# Patient Record
Sex: Male | Born: 1935
Health system: Southern US, Community
[De-identification: ages and names within clinical notes are randomized; demographics above are authoritative.]

## PROBLEM LIST (undated history)

## (undated) DIAGNOSIS — H5440 Blindness, one eye, unspecified eye: Secondary | ICD-10-CM

## (undated) DIAGNOSIS — I10 Essential (primary) hypertension: Secondary | ICD-10-CM

## (undated) DIAGNOSIS — N320 Bladder-neck obstruction: Secondary | ICD-10-CM

## (undated) DIAGNOSIS — M7989 Other specified soft tissue disorders: Secondary | ICD-10-CM

## (undated) DIAGNOSIS — N200 Calculus of kidney: Secondary | ICD-10-CM

## (undated) DIAGNOSIS — I2699 Other pulmonary embolism without acute cor pulmonale: Secondary | ICD-10-CM

## (undated) DIAGNOSIS — E119 Type 2 diabetes mellitus without complications: Secondary | ICD-10-CM

## (undated) DIAGNOSIS — M109 Gout, unspecified: Secondary | ICD-10-CM

## (undated) DIAGNOSIS — I5021 Acute systolic (congestive) heart failure: Secondary | ICD-10-CM

## (undated) DIAGNOSIS — I2510 Atherosclerotic heart disease of native coronary artery without angina pectoris: Secondary | ICD-10-CM

## (undated) DIAGNOSIS — K649 Unspecified hemorrhoids: Secondary | ICD-10-CM

## (undated) DIAGNOSIS — I2119 ST elevation (STEMI) myocardial infarction involving other coronary artery of inferior wall: Secondary | ICD-10-CM

## (undated) DIAGNOSIS — R7303 Prediabetes: Secondary | ICD-10-CM

## (undated) DIAGNOSIS — I255 Ischemic cardiomyopathy: Secondary | ICD-10-CM

## (undated) HISTORY — PX: CATARACT EXTRACTION: SUR2

## (undated) HISTORY — PX: INTRAOCULAR LENS INSERTION: SHX110

---
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
## 2000-04-25 ENCOUNTER — Ambulatory Visit (HOSPITAL_COMMUNITY): Admission: RE | Admit: 2000-04-25 | Discharge: 2000-04-25 | Payer: Self-pay | Admitting: Family Medicine

## 2000-05-02 ENCOUNTER — Encounter (HOSPITAL_COMMUNITY): Admission: RE | Admit: 2000-05-02 | Discharge: 2000-07-31 | Payer: Self-pay | Admitting: Family Medicine

## 2003-05-30 ENCOUNTER — Encounter (HOSPITAL_COMMUNITY): Admission: RE | Admit: 2003-05-30 | Discharge: 2003-08-28 | Payer: Self-pay | Admitting: Family Medicine

## 2003-11-22 ENCOUNTER — Ambulatory Visit (HOSPITAL_COMMUNITY): Admission: RE | Admit: 2003-11-22 | Discharge: 2003-11-22 | Payer: Self-pay | Admitting: *Deleted

## 2005-08-08 ENCOUNTER — Ambulatory Visit (HOSPITAL_COMMUNITY): Admission: RE | Admit: 2005-08-08 | Discharge: 2005-08-08 | Payer: Self-pay | Admitting: Urology

## 2005-08-14 ENCOUNTER — Ambulatory Visit (HOSPITAL_BASED_OUTPATIENT_CLINIC_OR_DEPARTMENT_OTHER): Admission: RE | Admit: 2005-08-14 | Discharge: 2005-08-14 | Payer: Self-pay | Admitting: Urology

## 2005-08-26 ENCOUNTER — Ambulatory Visit (HOSPITAL_COMMUNITY): Admission: RE | Admit: 2005-08-26 | Discharge: 2005-08-26 | Payer: Self-pay | Admitting: Urology

## 2005-09-14 ENCOUNTER — Ambulatory Visit (HOSPITAL_COMMUNITY): Admission: RE | Admit: 2005-09-14 | Discharge: 2005-09-14 | Payer: Self-pay | Admitting: Family Medicine

## 2014-05-07 ENCOUNTER — Emergency Department (HOSPITAL_COMMUNITY): Payer: Medicare Other | Admitting: Registered Nurse

## 2014-05-07 ENCOUNTER — Encounter (HOSPITAL_COMMUNITY)
Admission: EM | Disposition: A | Discharge status: Home / Self Care | Payer: Self-pay | Source: Home / Self Care | Attending: Emergency Medicine

## 2014-05-07 ENCOUNTER — Observation Stay (HOSPITAL_COMMUNITY)
Admission: EM | Admit: 2014-05-07 | Discharge: 2014-05-08 | Disposition: A | Discharge status: Home / Self Care | Payer: Medicare Other | Attending: Urology | Admitting: Urology

## 2014-05-07 ENCOUNTER — Encounter (HOSPITAL_COMMUNITY): Payer: Self-pay | Admitting: Emergency Medicine

## 2014-05-07 ENCOUNTER — Encounter (HOSPITAL_COMMUNITY): Payer: Self-pay | Admitting: *Deleted

## 2014-05-07 ENCOUNTER — Emergency Department (HOSPITAL_COMMUNITY)
Admission: EM | Admit: 2014-05-07 | Discharge: 2014-05-07 | Disposition: A | Discharge status: Home / Self Care | Payer: Medicare Other | Attending: Emergency Medicine | Admitting: Emergency Medicine

## 2014-05-07 DIAGNOSIS — R0902 Hypoxemia: Secondary | ICD-10-CM | POA: Diagnosis not present

## 2014-05-07 DIAGNOSIS — M109 Gout, unspecified: Secondary | ICD-10-CM | POA: Insufficient documentation

## 2014-05-07 DIAGNOSIS — Z7982 Long term (current) use of aspirin: Secondary | ICD-10-CM | POA: Insufficient documentation

## 2014-05-07 DIAGNOSIS — G8918 Other acute postprocedural pain: Secondary | ICD-10-CM

## 2014-05-07 DIAGNOSIS — N23 Unspecified renal colic: Secondary | ICD-10-CM | POA: Diagnosis not present

## 2014-05-07 DIAGNOSIS — Z87442 Personal history of urinary calculi: Secondary | ICD-10-CM | POA: Insufficient documentation

## 2014-05-07 DIAGNOSIS — E119 Type 2 diabetes mellitus without complications: Secondary | ICD-10-CM | POA: Insufficient documentation

## 2014-05-07 DIAGNOSIS — H5442 Blindness, left eye, normal vision right eye: Secondary | ICD-10-CM | POA: Diagnosis not present

## 2014-05-07 DIAGNOSIS — N200 Calculus of kidney: Secondary | ICD-10-CM

## 2014-05-07 DIAGNOSIS — Z960 Presence of urogenital implants: Secondary | ICD-10-CM | POA: Diagnosis not present

## 2014-05-07 DIAGNOSIS — N201 Calculus of ureter: Secondary | ICD-10-CM | POA: Insufficient documentation

## 2014-05-07 DIAGNOSIS — I10 Essential (primary) hypertension: Secondary | ICD-10-CM | POA: Diagnosis not present

## 2014-05-07 HISTORY — DX: Calculus of kidney: N20.0

## 2014-05-07 HISTORY — DX: Gout, unspecified: M10.9

## 2014-05-07 HISTORY — DX: Prediabetes: R73.03

## 2014-05-07 HISTORY — PX: CYSTOSCOPY W/ URETERAL STENT PLACEMENT: SHX1429

## 2014-05-07 HISTORY — DX: Blindness, one eye, unspecified eye: H54.40

## 2014-05-07 LAB — CBC WITH DIFFERENTIAL/PLATELET
Basophils Absolute: 0 10*3/uL (ref 0.0–0.1)
Basophils Relative: 0 % (ref 0–1)
EOS PCT: 1 % (ref 0–5)
Eosinophils Absolute: 0.1 10*3/uL (ref 0.0–0.7)
HEMATOCRIT: 47.7 % (ref 39.0–52.0)
Hemoglobin: 16.3 g/dL (ref 13.0–17.0)
LYMPHS ABS: 0.8 10*3/uL (ref 0.7–4.0)
LYMPHS PCT: 8 % — AB (ref 12–46)
MCH: 32.1 pg (ref 26.0–34.0)
MCHC: 34.2 g/dL (ref 30.0–36.0)
MCV: 94.1 fL (ref 78.0–100.0)
Monocytes Absolute: 0.8 10*3/uL (ref 0.1–1.0)
Monocytes Relative: 8 % (ref 3–12)
Neutro Abs: 8.6 10*3/uL — ABNORMAL HIGH (ref 1.7–7.7)
Neutrophils Relative %: 83 % — ABNORMAL HIGH (ref 43–77)
PLATELETS: 176 10*3/uL (ref 150–400)
RBC: 5.07 MIL/uL (ref 4.22–5.81)
RDW: 13.4 % (ref 11.5–15.5)
WBC: 10.3 10*3/uL (ref 4.0–10.5)

## 2014-05-07 LAB — COMPREHENSIVE METABOLIC PANEL
ALBUMIN: 3.4 g/dL — AB (ref 3.5–5.2)
ALT: 26 U/L (ref 0–53)
ANION GAP: 13 (ref 5–15)
AST: 25 U/L (ref 0–37)
Alkaline Phosphatase: 81 U/L (ref 39–117)
BILIRUBIN TOTAL: 0.4 mg/dL (ref 0.3–1.2)
BUN: 21 mg/dL (ref 6–23)
CHLORIDE: 101 meq/L (ref 96–112)
CO2: 24 mEq/L (ref 19–32)
CREATININE: 1.48 mg/dL — AB (ref 0.50–1.35)
Calcium: 9.1 mg/dL (ref 8.4–10.5)
GFR calc Af Amer: 51 mL/min — ABNORMAL LOW (ref >90)
GFR calc non Af Amer: 44 mL/min — ABNORMAL LOW (ref >90)
Glucose, Bld: 180 mg/dL — ABNORMAL HIGH (ref 70–99)
Potassium: 4.4 mEq/L (ref 3.7–5.3)
Sodium: 138 mEq/L (ref 137–147)
Total Protein: 6.5 g/dL (ref 6.0–8.3)

## 2014-05-07 LAB — URINALYSIS, ROUTINE W REFLEX MICROSCOPIC
Bilirubin Urine: NEGATIVE
Glucose, UA: NEGATIVE mg/dL
Ketones, ur: NEGATIVE mg/dL
Nitrite: NEGATIVE
PH: 5.5 (ref 5.0–8.0)
Protein, ur: NEGATIVE mg/dL
SPECIFIC GRAVITY, URINE: 1.025 (ref 1.005–1.030)
UROBILINOGEN UA: 0.2 mg/dL (ref 0.0–1.0)

## 2014-05-07 LAB — URINE MICROSCOPIC-ADD ON

## 2014-05-07 LAB — LIPASE, BLOOD: LIPASE: 20 U/L (ref 11–59)

## 2014-05-07 SURGERY — CYSTOSCOPY, WITH RETROGRADE PYELOGRAM AND URETERAL STENT INSERTION
Anesthesia: General | Site: Ureter | Laterality: Left

## 2014-05-07 MED ORDER — FUROSEMIDE 20 MG PO TABS
20.0000 mg | ORAL_TABLET | Freq: Every day | ORAL | Status: DC
  Administered 2014-05-08: 20 mg via ORAL
  Filled 2014-05-07: qty 1

## 2014-05-07 MED ORDER — SODIUM CHLORIDE 0.9 % IV SOLN
1000.0000 mL | INTRAVENOUS | Status: DC
  Administered 2014-05-07: 1000 mL via INTRAVENOUS

## 2014-05-07 MED ORDER — LIDOCAINE HCL 2 % EX GEL
CUTANEOUS | Status: DC | PRN
  Administered 2014-05-07: 1 via URETHRAL

## 2014-05-07 MED ORDER — HYDROMORPHONE HCL 1 MG/ML IJ SOLN
1.0000 mg | Freq: Once | INTRAMUSCULAR | Status: AC
  Administered 2014-05-07: 1 mg via INTRAVENOUS
  Filled 2014-05-07: qty 1

## 2014-05-07 MED ORDER — ONDANSETRON HCL 4 MG/2ML IJ SOLN
INTRAMUSCULAR | Status: DC | PRN
  Administered 2014-05-07: 4 mg via INTRAVENOUS

## 2014-05-07 MED ORDER — BELLADONNA ALKALOIDS-OPIUM 16.2-60 MG RE SUPP
1.0000 | Freq: Three times a day (TID) | RECTAL | Status: DC
  Administered 2014-05-08 (×2): 1 via RECTAL
  Filled 2014-05-07 (×2): qty 1

## 2014-05-07 MED ORDER — LACTATED RINGERS IV SOLN
INTRAVENOUS | Status: DC
  Administered 2014-05-07: 14:00:00 via INTRAVENOUS

## 2014-05-07 MED ORDER — ONDANSETRON HCL 4 MG/2ML IJ SOLN
INTRAMUSCULAR | Status: AC
  Filled 2014-05-07: qty 2

## 2014-05-07 MED ORDER — ONDANSETRON HCL 4 MG/2ML IJ SOLN
4.0000 mg | Freq: Once | INTRAMUSCULAR | Status: AC
  Administered 2014-05-07: 4 mg via INTRAVENOUS
  Filled 2014-05-07: qty 2

## 2014-05-07 MED ORDER — CIPROFLOXACIN IN D5W 400 MG/200ML IV SOLN
INTRAVENOUS | Status: AC
  Filled 2014-05-07: qty 200

## 2014-05-07 MED ORDER — IOHEXOL 300 MG/ML  SOLN
INTRAMUSCULAR | Status: DC | PRN
  Administered 2014-05-07: 10 mL

## 2014-05-07 MED ORDER — PROPOFOL 10 MG/ML IV BOLUS
INTRAVENOUS | Status: DC | PRN
  Administered 2014-05-07: 120 mg via INTRAVENOUS

## 2014-05-07 MED ORDER — OXYCODONE-ACETAMINOPHEN 5-325 MG PO TABS: 1.0000 | ORAL_TABLET | ORAL | Status: DC | PRN

## 2014-05-07 MED ORDER — MIRABEGRON ER 25 MG PO TB24
25.0000 mg | ORAL_TABLET | Freq: Every day | ORAL | Status: DC
  Administered 2014-05-08 (×2): 25 mg via ORAL
  Filled 2014-05-07 (×3): qty 1

## 2014-05-07 MED ORDER — ONDANSETRON HCL 4 MG/2ML IJ SOLN: 4.0000 mg | INTRAMUSCULAR | Status: DC | PRN

## 2014-05-07 MED ORDER — FENTANYL CITRATE 0.05 MG/ML IJ SOLN
INTRAMUSCULAR | Status: DC | PRN
  Administered 2014-05-07: 25 ug via INTRAVENOUS

## 2014-05-07 MED ORDER — ALLOPURINOL 300 MG PO TABS
300.0000 mg | ORAL_TABLET | Freq: Every day | ORAL | Status: DC
  Administered 2014-05-08: 300 mg via ORAL
  Filled 2014-05-07: qty 1

## 2014-05-07 MED ORDER — TAMSULOSIN HCL 0.4 MG PO CAPS
0.8000 mg | ORAL_CAPSULE | Freq: Every day | ORAL | Status: DC
  Administered 2014-05-07: 0.8 mg via ORAL
  Filled 2014-05-07 (×2): qty 2

## 2014-05-07 MED ORDER — KETOROLAC TROMETHAMINE 15 MG/ML IJ SOLN
15.0000 mg | Freq: Once | INTRAMUSCULAR | Status: AC
  Administered 2014-05-07: 15 mg via INTRAVENOUS
  Filled 2014-05-07: qty 1

## 2014-05-07 MED ORDER — SODIUM CHLORIDE 0.9 % IR SOLN
Status: DC | PRN
  Administered 2014-05-07: 3000 mL

## 2014-05-07 MED ORDER — HYDROMORPHONE HCL 1 MG/ML IJ SOLN: 0.5000 mg | INTRAMUSCULAR | Status: DC | PRN

## 2014-05-07 MED ORDER — PHENAZOPYRIDINE HCL 100 MG PO TABS
100.0000 mg | ORAL_TABLET | Freq: Three times a day (TID) | ORAL | Status: DC
  Administered 2014-05-07 – 2014-05-08 (×2): 100 mg via ORAL
  Filled 2014-05-07 (×5): qty 1

## 2014-05-07 MED ORDER — ASPIRIN 325 MG PO TABS
325.0000 mg | ORAL_TABLET | Freq: Every day | ORAL | Status: DC
  Administered 2014-05-08: 325 mg via ORAL
  Filled 2014-05-07: qty 1

## 2014-05-07 MED ORDER — LACTATED RINGERS IV SOLN
INTRAVENOUS | Status: DC | PRN
  Administered 2014-05-07: 13:00:00 via INTRAVENOUS

## 2014-05-07 MED ORDER — HYDROMORPHONE HCL 1 MG/ML IJ SOLN
0.5000 mg | INTRAMUSCULAR | Status: DC | PRN
  Administered 2014-05-07: 0.5 mg via INTRAVENOUS
  Filled 2014-05-07: qty 1

## 2014-05-07 MED ORDER — CIPROFLOXACIN IN D5W 400 MG/200ML IV SOLN
INTRAVENOUS | Status: DC | PRN
  Administered 2014-05-07: 400 mg via INTRAVENOUS

## 2014-05-07 MED ORDER — DOCUSATE SODIUM 100 MG PO CAPS
100.0000 mg | ORAL_CAPSULE | Freq: Two times a day (BID) | ORAL | Status: DC
  Administered 2014-05-08 (×2): 100 mg via ORAL
  Filled 2014-05-07 (×3): qty 1

## 2014-05-07 MED ORDER — BELLADONNA ALKALOIDS-OPIUM 16.2-60 MG RE SUPP: 1.0000 | Freq: Once | RECTAL | Status: DC

## 2014-05-07 MED ORDER — LIDOCAINE HCL 2 % EX GEL
CUTANEOUS | Status: AC
  Filled 2014-05-07: qty 10

## 2014-05-07 MED ORDER — FENTANYL CITRATE 0.05 MG/ML IJ SOLN: 25.0000 ug | INTRAMUSCULAR | Status: DC | PRN

## 2014-05-07 MED ORDER — FENTANYL CITRATE 0.05 MG/ML IJ SOLN
INTRAMUSCULAR | Status: AC
  Filled 2014-05-07: qty 2

## 2014-05-07 MED ORDER — PROPOFOL 10 MG/ML IV BOLUS
INTRAVENOUS | Status: AC
  Filled 2014-05-07: qty 20

## 2014-05-07 MED ORDER — LIDOCAINE HCL (CARDIAC) 20 MG/ML IV SOLN
INTRAVENOUS | Status: DC | PRN
  Administered 2014-05-07: 50 mg via INTRAVENOUS

## 2014-05-07 MED ORDER — LISINOPRIL 10 MG PO TABS
10.0000 mg | ORAL_TABLET | Freq: Every day | ORAL | Status: DC
  Administered 2014-05-08: 10 mg via ORAL
  Filled 2014-05-07: qty 1

## 2014-05-07 MED ORDER — SODIUM CHLORIDE 0.9 % IV SOLN
1000.0000 mL | Freq: Once | INTRAVENOUS | Status: AC
  Administered 2014-05-07: 1000 mL via INTRAVENOUS

## 2014-05-07 MED ORDER — LIDOCAINE HCL (CARDIAC) 20 MG/ML IV SOLN
INTRAVENOUS | Status: AC
  Filled 2014-05-07: qty 5

## 2014-05-07 MED ORDER — SENNA 8.6 MG PO TABS
1.0000 | ORAL_TABLET | Freq: Two times a day (BID) | ORAL | Status: DC
  Administered 2014-05-07 – 2014-05-08 (×2): 8.6 mg via ORAL
  Filled 2014-05-07 (×2): qty 1

## 2014-05-07 MED ORDER — ONDANSETRON 4 MG PO TBDP
4.0000 mg | ORAL_TABLET | Freq: Once | ORAL | Status: AC
  Administered 2014-05-07: 4 mg via ORAL
  Filled 2014-05-07: qty 1

## 2014-05-07 SURGICAL SUPPLY — 15 items
BAG URO CATCHER STRL LF (DRAPE) ×2 IMPLANT
CATH URET 5FR 28IN OPEN ENDED (CATHETERS) ×2 IMPLANT
GLOVE BIOGEL M STRL SZ7.5 (GLOVE) ×2 IMPLANT
GLOVE BIOGEL PI IND STRL 6.5 (GLOVE) IMPLANT
GLOVE BIOGEL PI INDICATOR 6.5 (GLOVE) ×4
GOWN STRL REUS W/ TWL LRG LVL3 (GOWN DISPOSABLE) IMPLANT
GOWN STRL REUS W/ TWL XL LVL3 (GOWN DISPOSABLE) IMPLANT
GOWN STRL REUS W/TWL LRG LVL3 (GOWN DISPOSABLE) ×3
GOWN STRL REUS W/TWL XL LVL3 (GOWN DISPOSABLE) ×3
GUIDEWIRE ANG ZIPWIRE 038X150 (WIRE) ×2 IMPLANT
GUIDEWIRE STR DUAL SENSOR (WIRE) ×2 IMPLANT
PACK CYSTO (CUSTOM PROCEDURE TRAY) ×2 IMPLANT
STENT CONTOUR 6FRX26X.038 (STENTS) ×2 IMPLANT
TUBING CONNECTING 10 (TUBING) ×1 IMPLANT
TUBING CONNECTING 10' (TUBING) ×1

## 2014-05-07 NOTE — Op Note (Signed)
Preoperative diagnosis:  1. Left proximal obstructing stone   Postoperative diagnosis:  1. same   Procedure:  1. Cystoscopy 2. left ureteral stent placement 3. left retrograde pyelography with interpretation   Surgeon: Crist FatBenjamin W. Jakobee Brackins, MD  Anesthesia: General  Complications: None  Intraoperative findings: normal caliber ureter to the level of the proximal ureter where there was a large filling defect consistent with the patient's known stone.  26cm x 20F stent placed in left ureter.  EBL: Minimal  Specimens: None  Indication: Gordon Novak is a 79 y.o. patient with large obstructing stone in left proximal ureter. After reviewing the management options for treatment, he elected to proceed with the above surgical procedure(s). We have discussed the potential benefits and risks of the procedure, side effects of the proposed treatment, the likelihood of the patient achieving the goals of the procedure, and any potential problems that might occur during the procedure or recuperation. Informed consent has been obtained.  Description of procedure:  The patient was taken to the operating room and general anesthesia was induced.  The patient was placed in the dorsal lithotomy position, prepped and draped in the usual sterile fashion, and preoperative antibiotics were administered. A preoperative time-out was performed.   Cystourethroscopy was performed.  The patient's urethra was examined and was normal with bilobar prostatic hypertrophy with a median lobe. The bladder was then systematically examined in its entirety. There was no evidence for any bladder tumors, stones, or other mucosal pathology.    Attention then turned to the left ureteral orifice and a ureteral catheter was used to intubate the ureteral orifice.  Omnipaque contrast was injected through the ureteral catheter and a retrograde pyelogram was performed with findings as dictated above.  A 0.38 sensor guidewire was then  advanced up the left ureter into the renal pelvis under fluoroscopic guidance.  The wire was then backloaded through the cystoscope and a ureteral stent was advance over the wire using Seldinger technique.  The stent was positioned appropriately under fluoroscopic and cystoscopic guidance.  The wire was then removed with an adequate stent curl noted in the renal pelvis as well as in the bladder.  The bladder was then emptied and the procedure ended.  The patient appeared to tolerate the procedure well and without complications.  The patient was able to be awakened and transferred to the recovery unit in satisfactory condition.    Crist FatBenjamin W. Pegge Cumberledge, M.D.

## 2014-05-07 NOTE — ED Provider Notes (Addendum)
CSN: [id number]     Arrival date & time 05/07/14  [id number] History   First MD Initiated Contact with Patient 05/07/14 1001     Chief Complaint  Patient presents with  . Nephrolithiasis    Patient is a 79 y.o. male presenting with flank pain. The history is provided by the patient.  Flank Pain Episode onset: Started on hursday. The problem occurs constantly. The problem has been gradually improving. Associated symptoms include abdominal pain (llq). Pertinent negatives include no shortness of breath. Associated symptoms comments: Left flank pain . Exacerbated by: does not hurt to urinate, drinking did make it worse earleirr. Treatments tried: Saw the urologist on Thursday, given oxycodone and zofran.  Pt saw his urologist on Thursday.  He had a CT scan in the office and diagnosed with a kidney stone. He was told he may end up needing lithotripsy.  Pt called the urologist today.  He was told to come to the ED.  He may need a surgery.  Past Medical History  Diagnosis Date  . Nephrolithiasis   . Pre-diabetes   . Gout   . Blindness of left eye    Past Surgical History  Procedure Laterality Date  . Cataract extraction    . Intraocular lens insertion Right    No family history on file. History  Substance Use Topics  . Smoking status: Never Smoker   . Smokeless tobacco: Not on file  . Alcohol Use: No    Review of Systems  Respiratory: Negative for shortness of breath.   Gastrointestinal: Positive for abdominal pain (llq).  Genitourinary: Positive for flank pain.  All other systems reviewed and are negative.     Allergies  Review of patient's allergies indicates no known allergies.  Home Medications   Prior to Admission medications   Not on File   BP 160/74 mmHg  Pulse 96  Temp(Src) 98.3 F (36.8 C) (Oral)  Resp 14  SpO2 95% Physical Exam  Constitutional: He appears well-developed and well-nourished. No distress.  HENT:  Head: Normocephalic and atraumatic.  Right Ear:  External ear normal.  Left Ear: External ear normal.  Eyes: Conjunctivae are normal. Right eye exhibits no discharge. Left eye exhibits no discharge. No scleral icterus.  Temporal deviation left eye  Neck: Neck supple. No tracheal deviation present.  Cardiovascular: Normal rate, regular rhythm and intact distal pulses.   Pulmonary/Chest: Effort normal and breath sounds normal. No stridor. No respiratory distress. He has no wheezes. He has no rales.  Abdominal: Soft. Bowel sounds are normal. He exhibits no distension. There is no tenderness. There is CVA tenderness (mild left). There is no rebound and no guarding.  Musculoskeletal: He exhibits edema. He exhibits no tenderness.  Edema of left lle (chronic per patient, has had negative outpatient workup in the past)  Neurological: He is alert. He has normal strength. No cranial nerve deficit (no facial droop, extraocular movements intact, no slurred speech) or sensory deficit. He exhibits normal muscle tone. He displays no seizure activity. Coordination normal.  Skin: Skin is warm and dry. No rash noted.  Psychiatric: He has a normal mood and affect.  Nursing note and vitals reviewed.   ED Course  Procedures (including critical care time) Labs Review Labs Reviewed  URINALYSIS, ROUTINE W REFLEX MICROSCOPIC - Abnormal; Notable for the following:    Hgb urine dipstick SMALL (*)    Leukocytes, UA SMALL (*)    All other components within normal limits  CBC WITH DIFFERENTIAL - Abnormal; Notable  for the following:    Neutrophils Relative % 83 (*)    Neutro Abs 8.6 (*)    Lymphocytes Relative 8 (*)    All other components within normal limits  COMPREHENSIVE METABOLIC PANEL - Abnormal; Notable for the following:    Glucose, Bld 180 (*)    Creatinine, Ser 1.48 (*)    Albumin 3.4 (*)    GFR calc non Af Amer 44 (*)    GFR calc Af Amer 51 (*)    All other components within normal limits  LIPASE, BLOOD  URINE MICROSCOPIC-ADD ON    Medications   0.9 %  sodium chloride infusion (0 mLs Intravenous Stopped 05/07/14 1137)    Followed by  0.9 %  sodium chloride infusion (1,000 mLs Intravenous New Bag/Given 05/07/14 1137)  HYDROmorphone (DILAUDID) injection 0.5 mg (0.5 mg Intravenous Given 05/07/14 1034)  ondansetron (ZOFRAN-ODT) disintegrating tablet 4 mg (4 mg Oral Given 05/07/14 0950)  ondansetron (ZOFRAN) injection 4 mg (4 mg Intravenous Given 05/07/14 1034)    MDM   Final diagnoses:  Ureteral colic   Pt had an outpatient CT scan showing a large distal ureteral stone.  Symptoms have improved in the ED.  Dr Craige CottaKirby evaluated the patient in the ED.  Plan on taking him to the OR for a ureteral stent.    Linwood DibblesJon Ashanta Amoroso, MD 05/07/14 1143  Linwood DibblesJon Manases Etchison, MD 05/07/14 [phone]

## 2014-05-07 NOTE — ED Notes (Signed)
Pt presents with worsening L flank pain and hematuria. Pt states he was seen today and had renal stent placed by Dr. Craige CottaKirby, stone too big to pass, pt returns Wednesday for decision on how to proceed to remove stones

## 2014-05-07 NOTE — H&P (Signed)
H&P  Chief Complaint: Renal Colic Pain  History of Present Illness: Gordon Novak is a 79 y.o. male with a history of nephrolithiasis who is taken for left-sided ureteral stent placement today. Please see earlier consult note for full details. Briefly, he underwent left ureteral stent placement for management of renal colic pain without any associated concern for infection. He was discharged following stent placement but returned to the emergency room this evening with persistent uncontrollable pain. He has not been having any fevers or chills at home and is otherwise doing okay. He has been voiding with some mild hematuria.  In the emergency department he was found to be afebrile with mild tachycardia to 91 and hypertension to 143/65. Also with low O2 sats 94% on 2 L nasal cannula. He reports that it hurts when he urinates, with pain in his left flank as well as dysuria. At the time I saw him, he stated that his pain was a little bit better however it has been coming and going. When the pain is at its worst, it is a 10 out of 10.  He also reports a little bit of additional difficulty urinating.  Past Medical History  Diagnosis Date  . Nephrolithiasis   . Pre-diabetes   . Gout   . Blindness of left eye     Past Surgical History  Procedure Laterality Date  . Cataract extraction    . Intraocular lens insertion Right     Home Medications:    Medication List    ASK your doctor about these medications        allopurinol 300 MG tablet  Commonly known as:  ZYLOPRIM  Take 300 mg by mouth daily.     aspirin 325 MG tablet  Take 325 mg by mouth daily.     beta carotene w/minerals tablet  Take 1 tablet by mouth daily.     furosemide 20 MG tablet  Commonly known as:  LASIX  Take 20 mg by mouth daily.     lisinopril 10 MG tablet  Commonly known as:  PRINIVIL,ZESTRIL  Take 10 mg by mouth daily.     multivitamin with minerals Tabs tablet  Take 1 tablet by mouth daily.     oxyCODONE-acetaminophen 5-325 MG per tablet  Commonly known as:  PERCOCET/ROXICET  Take 2 tablets by mouth every 6 (six) hours as needed for severe pain.     promethazine 12.5 MG tablet  Commonly known as:  PHENERGAN  Take 12.5 mg by mouth every 6 (six) hours as needed for nausea.     RAPAFLO 8 MG Caps capsule  Generic drug:  silodosin  Take 8 mg by mouth as needed (hesitant urination). with food     valACYclovir 500 MG tablet  Commonly known as:  VALTREX  Take 500 mg by mouth 2 (two) times daily as needed (cold sores).        Allergies: No Known Allergies  No family history on file.  Social History:  reports that he has never smoked. He does not have any smokeless tobacco history on file. He reports that he does not drink alcohol or use illicit drugs.  ROS: A complete review of systems was performed.  All systems are negative except for pertinent findings as noted.  Physical Exam:  Vital signs in last 24 hours: Temp:  [97.8 F (36.6 C)-98.3 F (36.8 C)] 98 F (36.7 C) (12/12 2038) Pulse Rate:  [64-96] 91 (12/12 2038) Resp:  [10-18] 18 (12/12 2038) BP: (  110-160)/(61-74) 143/65 mmHg (12/12 2038) SpO2:  [94 %-100 %] 94 % (12/12 2038) Weight:  [87.998 kg (194 lb)] 87.998 kg (194 lb) (12/12 2049) Constitutional:  Alert and oriented, No acute distress Cardiovascular: Regular rate and rhythm, No JVD Respiratory: Normal respiratory effort, Lungs clear bilaterally GI: Abdomen is soft, nontender, nondistended, no abdominal masses GU: Mild left CVA tenderness Lymphatic: No lymphadenopathy Neurologic: Grossly intact, no focal deficits Psychiatric: Normal mood and affect  Laboratory Data:   Recent Labs  05/07/14 1035  WBC 10.3  HGB 16.3  HCT 47.7  PLT 176     Recent Labs  05/07/14 1035  NA 138  K 4.4  CL 101  GLUCOSE 180*  BUN 21  CALCIUM 9.1  CREATININE 1.48*     Results for orders placed or performed during the hospital encounter of 05/07/14 (from the  past 24 hour(s))  Urinalysis, Routine w reflex microscopic     Status: Abnormal   Collection Time: 05/07/14 10:05 AM  Result Value Ref Range   Color, Urine YELLOW YELLOW   APPearance CLEAR CLEAR   Specific Gravity, Urine 1.025 1.005 - 1.030   pH 5.5 5.0 - 8.0   Glucose, UA NEGATIVE NEGATIVE mg/dL   Hgb urine dipstick SMALL (A) NEGATIVE   Bilirubin Urine NEGATIVE NEGATIVE   Ketones, ur NEGATIVE NEGATIVE mg/dL   Protein, ur NEGATIVE NEGATIVE mg/dL   Urobilinogen, UA 0.2 0.0 - 1.0 mg/dL   Nitrite NEGATIVE NEGATIVE   Leukocytes, UA SMALL (A) NEGATIVE  Urine microscopic-add on     Status: None   Collection Time: 05/07/14 10:05 AM  Result Value Ref Range   Squamous Epithelial / LPF RARE RARE   WBC, UA 3-6 <3 WBC/hpf   RBC / HPF 3-6 <3 RBC/hpf  CBC WITH DIFFERENTIAL     Status: Abnormal   Collection Time: 05/07/14 10:35 AM  Result Value Ref Range   WBC 10.3 4.0 - 10.5 K/uL   RBC 5.07 4.22 - 5.81 MIL/uL   Hemoglobin 16.3 13.0 - 17.0 g/dL   HCT 40.9 81.1 - 91.4 %   MCV 94.1 78.0 - 100.0 fL   MCH 32.1 26.0 - 34.0 pg   MCHC 34.2 30.0 - 36.0 g/dL   RDW 78.2 95.6 - 21.3 %   Platelets 176 150 - 400 K/uL   Neutrophils Relative % 83 (H) 43 - 77 %   Neutro Abs 8.6 (H) 1.7 - 7.7 K/uL   Lymphocytes Relative 8 (L) 12 - 46 %   Lymphs Abs 0.8 0.7 - 4.0 K/uL   Monocytes Relative 8 3 - 12 %   Monocytes Absolute 0.8 0.1 - 1.0 K/uL   Eosinophils Relative 1 0 - 5 %   Eosinophils Absolute 0.1 0.0 - 0.7 K/uL   Basophils Relative 0 0 - 1 %   Basophils Absolute 0.0 0.0 - 0.1 K/uL  Comprehensive metabolic panel     Status: Abnormal   Collection Time: 05/07/14 10:35 AM  Result Value Ref Range   Sodium 138 137 - 147 mEq/L   Potassium 4.4 3.7 - 5.3 mEq/L   Chloride 101 96 - 112 mEq/L   CO2 24 19 - 32 mEq/L   Glucose, Bld 180 (H) 70 - 99 mg/dL   BUN 21 6 - 23 mg/dL   Creatinine, Ser 0.86 (H) 0.50 - 1.35 mg/dL   Calcium 9.1 8.4 - 57.8 mg/dL   Total Protein 6.5 6.0 - 8.3 g/dL   Albumin 3.4 (L) 3.5  - 5.2 g/dL  AST 25 0 - 37 U/L   ALT 26 0 - 53 U/L   Alkaline Phosphatase 81 39 - 117 U/L   Total Bilirubin 0.4 0.3 - 1.2 mg/dL   GFR calc non Af Amer 44 (L) >90 mL/min   GFR calc Af Amer 51 (L) >90 mL/min   Anion gap 13 5 - 15  Lipase, blood     Status: None   Collection Time: 05/07/14 10:35 AM  Result Value Ref Range   Lipase 20 11 - 59 U/L   No results found for this or any previous visit (from the past 240 hour(s)).  Renal Function:  Recent Labs  05/07/14 1035  CREATININE 1.48*   Estimated Creatinine Clearance: 45.9 mL/min (by C-G formula based on Cr of 1.48).  Radiologic Imaging: No results found.  Impression/Assessment:  79 year old who returns to the emergency department with persistent left-sided renal colic pain status post left-sided ureteral stent placement earlier today for a left-sided UPJ stone. He has no evidence of infection clinically however his pain is uncontrollable requiring admission to observation for pain management.  Plan:  Admit to observation We'll provide narcotic pain medicine as well as B and O suppository and myrbetriq to hopefully help with stent-related discomfort.  We'll also switch alpha-blocker to Flomax. Regular diet. We'll check postvoid residual to ensure that he is emptying his bladder reasonably well. Labs in the morning Incentive spirometer

## 2014-05-07 NOTE — Transfer of Care (Signed)
Immediate Anesthesia Transfer of Care Note  Patient: Gordon Novak  Procedure(s) Performed: Procedure(s): CYSTOSCOPY WITH RETROGRADE PYELOGRAM/ LEFT URETERAL STENT PLACEMENT (Left)  Patient Location: PACU  Anesthesia Type:General  Level of Consciousness: awake, alert , oriented and patient cooperative  Airway & Oxygen Therapy: Patient Spontanous Breathing and Patient connected to face mask oxygen  Post-op Assessment: Report given to PACU RN, Post -op Vital signs reviewed and stable and Patient moving all extremities  Post vital signs: Reviewed and stable  Complications: No apparent anesthesia complications

## 2014-05-07 NOTE — Anesthesia Procedure Notes (Signed)
Procedure Name: LMA Insertion Date/Time: 05/07/2014 1:03 PM Performed by: Jarvis NewcomerARMISTEAD, Mardie Kellen A Pre-anesthesia Checklist: Patient identified, Emergency Drugs available, Suction available, Patient being monitored and Timeout performed Patient Re-evaluated:Patient Re-evaluated prior to inductionOxygen Delivery Method: Circle system utilized Preoxygenation: Pre-oxygenation with 100% oxygen Intubation Type: IV induction Ventilation: Mask ventilation without difficulty LMA: LMA inserted LMA Size: 4.0 Number of attempts: 1 Placement Confirmation: positive ETCO2 and breath sounds checked- equal and bilateral Tube secured with: Tape Dental Injury: Teeth and Oropharynx as per pre-operative assessment

## 2014-05-07 NOTE — ED Notes (Signed)
MD at bedside. 

## 2014-05-07 NOTE — ED Notes (Signed)
Pt reports Hx kidney stones. Seen at [facility] Tuesday, Dx with stones. C/o L flank pain, worse since 1am today. Denies hematuria, +nausea, chills.

## 2014-05-07 NOTE — Anesthesia Preprocedure Evaluation (Addendum)
Anesthesia Evaluation  Patient identified by MRN, date of birth, ID band Patient awake    Reviewed: Allergy & Precautions, H&P , NPO status , Patient's Chart, lab work & pertinent test results  Airway Mallampati: II  TM Distance: >3 FB Neck ROM: full    Dental no notable dental hx. (+) Teeth Intact, Dental Advisory Given   Pulmonary neg pulmonary ROS,  breath sounds clear to auscultation  Pulmonary exam normal       Cardiovascular Exercise Tolerance: Good negative cardio ROS  Rhythm:regular Rate:Normal     Neuro/Psych Blind left eye negative neurological ROS  negative psych ROS   GI/Hepatic negative GI ROS, Neg liver ROS,   Endo/Other  prediabetes  Renal/GU negative Renal ROS  negative genitourinary   Musculoskeletal   Abdominal   Peds  Hematology negative hematology ROS (+)   Anesthesia Other Findings   Reproductive/Obstetrics negative OB ROS                            Anesthesia Physical Anesthesia Plan  ASA: II  Anesthesia Plan: General   Post-op Pain Management:    Induction: Intravenous  Airway Management Planned: LMA  Additional Equipment:   Intra-op Plan:   Post-operative Plan:   Informed Consent: I have reviewed the patients History and Physical, chart, labs and discussed the procedure including the risks, benefits and alternatives for the proposed anesthesia with the patient or authorized representative who has indicated his/her understanding and acceptance.   Dental Advisory Given  Plan Discussed with: CRNA and Surgeon  Anesthesia Plan Comments:         Anesthesia Quick Evaluation

## 2014-05-07 NOTE — Discharge Instructions (Signed)
DISCHARGE INSTRUCTIONS FOR KIDNEY STONE/URETERAL STENT   MEDICATIONS:  1.  Resume all your other meds from home.  ACTIVITY:  1. No strenuous activity 2. No driving while on narcotic pain medications  3. Drink plenty of water  4. Continue to walk at home - you can still get blood clots when you are at home, so keep active, but don't over do it.  5. May return to work/school tomorrow or when you feel ready   BATHING:  1. You can shower and we recommend daily showers    SIGNS/SYMPTOMS TO CALL:  Please call us if you have a fever greater than 101.5, uncontrolled nausea/vomiting, uncontrolled pain, dizziness, unable to urinate, bloody urine, chest pain, shortness of breath, leg swelling, leg pain, redness around wound, drainage from wound, or any other concerns or questions.   You can reach us at [phone].   FOLLOW-UP:  1. As scheduled with Dr. Mena GoesEskridge, MD

## 2014-05-07 NOTE — ED Provider Notes (Signed)
CSN: [id number]     Arrival date & time 05/07/14  1937 History   First MD Initiated Contact with Patient 05/07/14 2050     Chief Complaint  Patient presents with  . Flank Pain     Patient is a 79 y.o. male presenting with flank pain. The history is provided by the patient.  Flank Pain   Mr. Gordon Novak presents for evaluation of left flank pain.  He felt pain in his left flank and left lower quadrant 2 days ago. He was seen in the emergency department this morning and admitted to the operating room for stent placement by urology for a stone. He comes in today because his pain is uncontrolled despite pain medications at home. He is tried oxycodone without any improvement in his symptoms. He states that the pain with urination is more intense than prior to his initial arrival. He denies fevers, chest pain, shortness of breath, vomiting. Symptoms are severe, constant, worsening.  Past Medical History  Diagnosis Date  . Nephrolithiasis   . Pre-diabetes   . Gout   . Blindness of left eye    Past Surgical History  Procedure Laterality Date  . Cataract extraction    . Intraocular lens insertion Right    No family history on file. History  Substance Use Topics  . Smoking status: Never Smoker   . Smokeless tobacco: Not on file  . Alcohol Use: No    Review of Systems  Genitourinary: Positive for flank pain.  All other systems reviewed and are negative.     Allergies  Review of patient's allergies indicates no known allergies.  Home Medications   Prior to Admission medications   Medication Sig Start Date End Date Taking? Authorizing Provider  allopurinol (ZYLOPRIM) 300 MG tablet Take 300 mg by mouth daily.  02/15/14   Historical Provider, MD  aspirin 325 MG tablet Take 325 mg by mouth daily.    Historical Provider, MD  beta carotene w/minerals (OCUVITE) tablet Take 1 tablet by mouth daily.    Historical Provider, MD  furosemide (LASIX) 20 MG tablet Take 20 mg by mouth daily.   02/15/14   Historical Provider, MD  lisinopril (PRINIVIL,ZESTRIL) 10 MG tablet Take 10 mg by mouth daily.  02/15/14   Historical Provider, MD  Multiple Vitamin (MULTIVITAMIN WITH MINERALS) TABS tablet Take 1 tablet by mouth daily.    Historical Provider, MD  oxyCODONE-acetaminophen (PERCOCET/ROXICET) 5-325 MG per tablet Take 2 tablets by mouth every 6 (six) hours as needed for severe pain.  05/05/14   Historical Provider, MD  promethazine (PHENERGAN) 12.5 MG tablet Take 12.5 mg by mouth every 6 (six) hours as needed for nausea.  05/05/14   Historical Provider, MD  RAPAFLO 8 MG CAPS capsule Take 8 mg by mouth as needed (hesitant urination). with food 03/30/14   Historical Provider, MD  valACYclovir (VALTREX) 500 MG tablet Take 500 mg by mouth 2 (two) times daily as needed (cold sores).    Historical Provider, MD   BP 143/65 mmHg  Pulse 91  Temp(Src) 98 F (36.7 C) (Oral)  Resp 18  Ht 6' (1.829 m)  Wt 194 lb (87.998 kg)  BMI 26.31 kg/m2  SpO2 94% Physical Exam  Constitutional: He is oriented to person, place, and time. He appears well-developed and well-nourished.  HENT:  Head: Normocephalic and atraumatic.  Cardiovascular: Normal rate.   No murmur heard. Pulmonary/Chest: Effort normal. No respiratory distress.  Abdominal: Soft. There is no rebound and no guarding.  Mild  LLQ tenderness  Musculoskeletal: He exhibits no edema or tenderness.  Neurological: He is alert and oriented to person, place, and time.  Skin: Skin is warm and dry.  Psychiatric: He has a normal mood and affect. His behavior is normal.  Nursing note and vitals reviewed.   ED Course  Procedures (including critical care time) Labs Review Labs Reviewed - No data to display  Imaging Review No results found.   EKG Interpretation None      MDM   Final diagnoses:  Postoperative pain   Reviewed labs from prior visit today.    D/w Dr. Craige CottaKirby - he will see the pt in the Emergency Department.   Pt has been seen  by Dr. Craige CottaKirby - will admit for pain control.     Tilden FossaElizabeth Shlok Raz, MD 05/08/14 [phone]

## 2014-05-07 NOTE — H&P (Signed)
H&P  Chief Complaint: Left renal colic  History of Present Illness: Gordon Novak is a 79 y.o. male with history of nephrolithiasis who presents with a week of intermittent left flank pain.  He has been managed by Dr. Mena Goes in the past for stones. He was seen in our office this past week with left-sided flank pain and underwent a CT scan which illustrated a likely obstructing stone at his left UPJ.   he was otherwise doing okay at that time and was discharged from clinic with a plan to follow-up this coming Wednesday. Last night, he began experiencing severe left flank pain again and came to the emergency room.   In the emergency room, he was afebrile and his vital signs are within normal limits with the exception of some hypertension and mild tachycardia in the 90s. His pain has improved since he has been here. His urinalysis shows nitrite negative and small leuk esterace.  White blood cell count is 10.3.  He was having subjective chills at home but no fevers. He is afebrile on the emergency department.  Past Medical History  Diagnosis Date  . Nephrolithiasis   . Pre-diabetes   . Gout   . Blindness of left eye     Past Surgical History  Procedure Laterality Date  . Cataract extraction    . Intraocular lens insertion Right     Home Medications:    Medication List    ASK your doctor about these medications        allopurinol 300 MG tablet  Commonly known as:  ZYLOPRIM  Take 300 mg by mouth daily.     furosemide 20 MG tablet  Commonly known as:  LASIX  Take 20 mg by mouth daily.     lisinopril 10 MG tablet  Commonly known as:  PRINIVIL,ZESTRIL  Take 10 mg by mouth daily.     oxyCODONE-acetaminophen 5-325 MG per tablet  Commonly known as:  PERCOCET/ROXICET  Take 1 tablet by mouth every 6 (six) hours as needed for severe pain.     promethazine 12.5 MG tablet  Commonly known as:  PHENERGAN  Take 12.5 mg by mouth every 6 (six) hours as needed for nausea.     RAPAFLO 8  MG Caps capsule  Generic drug:  silodosin  Take 8 mg by mouth daily. with food        Allergies: No Known Allergies  No family history on file.  Social History:  reports that he has never smoked. He does not have any smokeless tobacco history on file. He reports that he does not drink alcohol or use illicit drugs.  ROS: A complete review of systems was performed.  All systems are negative except for pertinent findings as noted.  Physical Exam:  Vital signs in last 24 hours: Temp:  [98.3 F (36.8 C)] 98.3 F (36.8 C) (12/12 0937) Pulse Rate:  [96] 96 (12/12 0937) Resp:  [14] 14 (12/12 0937) BP: (160)/(74) 160/74 mmHg (12/12 0937) SpO2:  [95 %] 95 % (12/12 0937) Constitutional:  Alert and oriented, No acute distress Cardiovascular: Regular rate and rhythm, No JVD Respiratory: Normal respiratory effort, Lungs clear bilaterally GI: Abdomen is soft, nontender, nondistended, no abdominal masses GU: No CVA tenderness Lymphatic: No lymphadenopathy Neurologic: Grossly intact, no focal deficits Psychiatric: Normal mood and affect  Laboratory Data:   Recent Labs  05/07/14 1035  WBC 10.3  HGB 16.3  HCT 47.7  PLT 176     Recent Labs  05/07/14 1035  NA 138  K 4.4  CL 101  GLUCOSE 180*  BUN 21  CALCIUM 9.1  CREATININE 1.48*     Results for orders placed or performed during the hospital encounter of 05/07/14 (from the past 24 hour(s))  Urinalysis, Routine w reflex microscopic     Status: Abnormal   Collection Time: 05/07/14 10:05 AM  Result Value Ref Range   Color, Urine YELLOW YELLOW   APPearance CLEAR CLEAR   Specific Gravity, Urine 1.025 1.005 - 1.030   pH 5.5 5.0 - 8.0   Glucose, UA NEGATIVE NEGATIVE mg/dL   Hgb urine dipstick SMALL (A) NEGATIVE   Bilirubin Urine NEGATIVE NEGATIVE   Ketones, ur NEGATIVE NEGATIVE mg/dL   Protein, ur NEGATIVE NEGATIVE mg/dL   Urobilinogen, UA 0.2 0.0 - 1.0 mg/dL   Nitrite NEGATIVE NEGATIVE   Leukocytes, UA SMALL (A)  NEGATIVE  Urine microscopic-add on     Status: None   Collection Time: 05/07/14 10:05 AM  Result Value Ref Range   Squamous Epithelial / LPF RARE RARE   WBC, UA 3-6 <3 WBC/hpf   RBC / HPF 3-6 <3 RBC/hpf  CBC WITH DIFFERENTIAL     Status: Abnormal   Collection Time: 05/07/14 10:35 AM  Result Value Ref Range   WBC 10.3 4.0 - 10.5 K/uL   RBC 5.07 4.22 - 5.81 MIL/uL   Hemoglobin 16.3 13.0 - 17.0 g/dL   HCT 82.947.7 56.239.0 - 13.052.0 %   MCV 94.1 78.0 - 100.0 fL   MCH 32.1 26.0 - 34.0 pg   MCHC 34.2 30.0 - 36.0 g/dL   RDW 86.513.4 78.411.5 - 69.615.5 %   Platelets 176 150 - 400 K/uL   Neutrophils Relative % 83 (H) 43 - 77 %   Neutro Abs 8.6 (H) 1.7 - 7.7 K/uL   Lymphocytes Relative 8 (L) 12 - 46 %   Lymphs Abs 0.8 0.7 - 4.0 K/uL   Monocytes Relative 8 3 - 12 %   Monocytes Absolute 0.8 0.1 - 1.0 K/uL   Eosinophils Relative 1 0 - 5 %   Eosinophils Absolute 0.1 0.0 - 0.7 K/uL   Basophils Relative 0 0 - 1 %   Basophils Absolute 0.0 0.0 - 0.1 K/uL  Comprehensive metabolic panel     Status: Abnormal   Collection Time: 05/07/14 10:35 AM  Result Value Ref Range   Sodium 138 137 - 147 mEq/L   Potassium 4.4 3.7 - 5.3 mEq/L   Chloride 101 96 - 112 mEq/L   CO2 24 19 - 32 mEq/L   Glucose, Bld 180 (H) 70 - 99 mg/dL   BUN 21 6 - 23 mg/dL   Creatinine, Ser 2.951.48 (H) 0.50 - 1.35 mg/dL   Calcium 9.1 8.4 - 28.410.5 mg/dL   Total Protein 6.5 6.0 - 8.3 g/dL   Albumin 3.4 (L) 3.5 - 5.2 g/dL   AST 25 0 - 37 U/L   ALT 26 0 - 53 U/L   Alkaline Phosphatase 81 39 - 117 U/L   Total Bilirubin 0.4 0.3 - 1.2 mg/dL   GFR calc non Af Amer 44 (L) >90 mL/min   GFR calc Af Amer 51 (L) >90 mL/min   Anion gap 13 5 - 15  Lipase, blood     Status: None   Collection Time: 05/07/14 10:35 AM  Result Value Ref Range   Lipase 20 11 - 59 U/L   No results found for this or any previous visit (from the past 240 hour(s)).  Renal  Function:  Recent Labs  05/07/14 1035  CREATININE 1.48*   CrCl cannot be calculated (Unknown ideal  weight.).  Radiologic Imaging: No results found.   I reviewed his imaging from recent CT scan in our clinic. The left UPJ as noted above.  Impression/Assessment:  79 year old with left-sided renal colic and recent CT scan showing left-sided UPJ stone. Not concerned for infection based on UA and labs, physical exam, vital signs.   He is otherwise in his usual state of health and denies any recent chest pain, shortness of breath or other symptoms.  Plan:  Remain nothing by mouth, will plan to take to operating room for left ureteral stent placement today. Should be able to be discharged from PACU Follow up next week for evaluation and plan for stone treatment with Dr. Mena GoesEskridge

## 2014-05-07 NOTE — Anesthesia Postprocedure Evaluation (Signed)
  Anesthesia Post-op Note  Patient: Gordon Novak  Procedure(s) Performed: Procedure(s) (LRB): CYSTOSCOPY WITH RETROGRADE PYELOGRAM/ LEFT URETERAL STENT PLACEMENT (Left)  Patient Location: PACU  Anesthesia Type: General  Level of Consciousness: awake and alert   Airway and Oxygen Therapy: Patient Spontanous Breathing  Post-op Pain: mild  Post-op Assessment: Post-op Vital signs reviewed, Patient's Cardiovascular Status Stable, Respiratory Function Stable, Patent Airway and No signs of Nausea or vomiting  Last Vitals:  Filed Vitals:   05/07/14 1400  BP:   Pulse:   Temp: 36.6 C  Resp:     Post-op Vital Signs: stable   Complications: No apparent anesthesia complications

## 2014-05-08 DIAGNOSIS — N23 Unspecified renal colic: Secondary | ICD-10-CM | POA: Diagnosis not present

## 2014-05-08 LAB — BASIC METABOLIC PANEL
ANION GAP: 9 (ref 5–15)
BUN: 17 mg/dL (ref 6–23)
CO2: 26 mEq/L (ref 19–32)
Calcium: 8.6 mg/dL (ref 8.4–10.5)
Chloride: 106 mEq/L (ref 96–112)
Creatinine, Ser: 1.24 mg/dL (ref 0.50–1.35)
GFR calc Af Amer: 63 mL/min — ABNORMAL LOW (ref >90)
GFR calc non Af Amer: 54 mL/min — ABNORMAL LOW (ref >90)
GLUCOSE: 122 mg/dL — AB (ref 70–99)
POTASSIUM: 4.4 meq/L (ref 3.7–5.3)
SODIUM: 141 meq/L (ref 137–147)

## 2014-05-08 LAB — CBC
HCT: 41.3 % (ref 39.0–52.0)
Hemoglobin: 13.5 g/dL (ref 13.0–17.0)
MCH: 31.3 pg (ref 26.0–34.0)
MCHC: 32.7 g/dL (ref 30.0–36.0)
MCV: 95.6 fL (ref 78.0–100.0)
Platelets: 144 10*3/uL — ABNORMAL LOW (ref 150–400)
RBC: 4.32 MIL/uL (ref 4.22–5.81)
RDW: 13.4 % (ref 11.5–15.5)
WBC: 7.4 10*3/uL (ref 4.0–10.5)

## 2014-05-08 MED ORDER — MIRABEGRON ER 50 MG PO TB24: 50.0000 mg | ORAL_TABLET | Freq: Every day | ORAL | Status: DC

## 2014-05-08 MED ORDER — TAMSULOSIN HCL 0.4 MG PO CAPS: 0.8000 mg | ORAL_CAPSULE | Freq: Every day | ORAL | Status: DC

## 2014-05-08 NOTE — Progress Notes (Signed)
Pt voided 125ccs of amber urine. Post void residual using bladder scanner was 0 mL.  Gordon Novak, Gordon Novak L

## 2014-05-08 NOTE — Progress Notes (Signed)
Assessment unchanged. Pt and family verbalized understanding of dc instructions through teach back. No complaints voiced. Script x 2 given as provided by MD. Discharged via foot per pt request to front entrance to meet awaiting vehicle to carry home. Accompanied by daughter, wife and NT.

## 2014-05-08 NOTE — Progress Notes (Signed)
UR completed 

## 2014-05-08 NOTE — Discharge Instructions (Signed)
Ureteral Stent Implantation Ureteral stent implantation is the implantation of a soft plastic tube with multiple holes into the tube that drains urine from your kidney to your bladder (ureter). The stent helps drain your kidney when there is a blockage of the flow of urine in your ureter. The stent has a coil on each end to keep it from falling out. One end stays in the kidney. The other end stays in the bladder. It is most often taken out after any blockage has been removed or your ureter has healed. Short-term stents have a string attached to make removal quite easy. Removal of a short-term stent can be done in your health care provider's office or by you at home. Long-term stents need to be changed every few months.  DISCHARGE INSTRUCTIONS FOR KIDNEY STONE/URETERAL STENT   MEDICATIONS:  1.  Resume all your other meds from home 2. Flomax 0.8mg  daily 3. Myrbetriq is to help with overactive bladder   ACTIVITY:  1. No strenuous activity x 1week  2. No driving while on narcotic pain medications  3. Drink plenty of water  4. Continue to walk at home - you can still get blood clots when you are at home, so keep active, but don't over do it.  5. May return to work/school tomorrow or when you feel ready   BATHING:  1. You can shower and we recommend daily showers  2. You have a string coming from your urethra: The stent string is attached to your ureteral stent. Do not pull on this.   SIGNS/SYMPTOMS TO CALL:  Please call us if you have a fever greater than 101.5, uncontrolled nausea/vomiting, uncontrolled pain, dizziness, unable to urinate, bloody urine, chest pain, shortness of breath, leg swelling, leg pain, redness around wound, drainage from wound, or any other concerns or questions.   You can reach us at [phone].   FOLLOW-UP:  1. As scheduled with Dr. Mena GoesEskridge

## 2014-05-08 NOTE — Discharge Summary (Signed)
Date of admission: 05/07/2014  Date of discharge: 05/08/2014  Admission diagnosis: renal colic  Discharge diagnosis: same  Secondary diagnoses:  Patient Active Problem List   Diagnosis Date Noted  . Nephrolithiasis 05/07/2014    History and Physical: For full details, please see admission history and physical. Briefly, Gordon Novak is a 79 y.o. year old patient who presented to the ED following ureteral stent placement for stent discomfort.  KUB showed the stent to be in appropriate position, U/S showed no evidence of hydronephrosis.  He was admitted for pain control.   Exam on day of discharge Filed Vitals:   05/07/14 2038 05/07/14 2049 05/08/14 0013 05/08/14 0616  BP: 143/65  124/50 112/68  Pulse: 91  92 54  Temp: 98 F (36.7 C)  98.5 F (36.9 C) 98.3 F (36.8 C)  TempSrc: Oral  Oral Oral  Resp: 18  16 18   Height:  6' (1.829 m)    Weight:  87.998 kg (194 lb)    SpO2: 94%  93% 92%    Intake/Output Summary (Last 24 hours) at 05/08/14 0805 Last data filed at 05/08/14 3500  Gross per 24 hour  Intake      0 ml  Output    275 ml  Net   -275 ml   NAD abd soft No CVA tenderness Ext symmetric  Hospital Course:   His hospital course was uncomplicated.  He was given a B&O suppository and IV pain medication.  He was started on mirabegron and double flomax.  His pain was improved.  He was still complaining of pain with urination.  On HD #2  he had met discharge criteria: was eating a regular diet, was up and ambulating independently,  pain was well controlled, was voiding without a catheter, and was ready to for discharge.   Laboratory values:   Recent Labs  05/07/14 1035 05/08/14 0502  WBC 10.3 7.4  HGB 16.3 13.5  HCT 47.7 41.3    Recent Labs  05/07/14 1035 05/08/14 0502  NA 138 141  K 4.4 4.4  CL 101 106  CO2 24 26  GLUCOSE 180* 122*  BUN 21 17  CREATININE 1.48* 1.24  CALCIUM 9.1 8.6   No results for input(s): LABPT, INR in the last 72 hours. No  results for input(s): LABURIN in the last 72 hours. No results found for this or any previous visit.  Disposition: Home  Discharge instruction: provided to the patient.  Discharge medications:   Medication List    STOP taking these medications        promethazine 12.5 MG tablet  Commonly known as:  PHENERGAN     RAPAFLO 8 MG Caps capsule  Generic drug:  silodosin      TAKE these medications        allopurinol 300 MG tablet  Commonly known as:  ZYLOPRIM  Take 300 mg by mouth daily.     aspirin 325 MG tablet  Take 325 mg by mouth daily.     beta carotene w/minerals tablet  Take 1 tablet by mouth daily.     furosemide 20 MG tablet  Commonly known as:  LASIX  Take 20 mg by mouth daily.     lisinopril 10 MG tablet  Commonly known as:  PRINIVIL,ZESTRIL  Take 10 mg by mouth daily.     mirabegron ER 50 MG Tb24 tablet  Commonly known as:  MYRBETRIQ  Take 1 tablet (50 mg total) by mouth daily.  multivitamin with minerals Tabs tablet  Take 1 tablet by mouth daily.     oxyCODONE-acetaminophen 5-325 MG per tablet  Commonly known as:  PERCOCET/ROXICET  Take 2 tablets by mouth every 6 (six) hours as needed for severe pain.     tamsulosin 0.4 MG Caps capsule  Commonly known as:  FLOMAX  Take 2 capsules (0.8 mg total) by mouth daily.     valACYclovir 500 MG tablet  Commonly known as:  VALTREX  Take 500 mg by mouth 2 (two) times daily as needed (cold sores).        Followup:   Wednesday with Dr. Junious Silk as scheduled.

## 2014-05-09 ENCOUNTER — Encounter (HOSPITAL_COMMUNITY): Payer: Self-pay | Admitting: Urology

## 2014-05-11 ENCOUNTER — Encounter (HOSPITAL_COMMUNITY): Payer: Self-pay | Admitting: Urology

## 2014-05-11 ENCOUNTER — Other Ambulatory Visit: Payer: Self-pay | Admitting: Urology

## 2014-05-12 ENCOUNTER — Emergency Department (HOSPITAL_COMMUNITY): Payer: Medicare Other

## 2014-05-12 ENCOUNTER — Encounter (HOSPITAL_COMMUNITY): Payer: Self-pay | Admitting: Emergency Medicine

## 2014-05-12 ENCOUNTER — Inpatient Hospital Stay (HOSPITAL_COMMUNITY)
Admission: EM | Admit: 2014-05-12 | Discharge: 2014-05-14 | DRG: 854 | Disposition: A | Discharge status: Home / Self Care | Payer: Medicare Other | Attending: Internal Medicine | Admitting: Internal Medicine

## 2014-05-12 DIAGNOSIS — R6510 Systemic inflammatory response syndrome (SIRS) of non-infectious origin without acute organ dysfunction: Secondary | ICD-10-CM | POA: Diagnosis present

## 2014-05-12 DIAGNOSIS — K6289 Other specified diseases of anus and rectum: Secondary | ICD-10-CM | POA: Diagnosis present

## 2014-05-12 DIAGNOSIS — N202 Calculus of kidney with calculus of ureter: Secondary | ICD-10-CM | POA: Diagnosis present

## 2014-05-12 DIAGNOSIS — Z7982 Long term (current) use of aspirin: Secondary | ICD-10-CM

## 2014-05-12 DIAGNOSIS — H5442 Blindness, left eye, normal vision right eye: Secondary | ICD-10-CM | POA: Diagnosis present

## 2014-05-12 DIAGNOSIS — I10 Essential (primary) hypertension: Secondary | ICD-10-CM | POA: Diagnosis present

## 2014-05-12 DIAGNOSIS — Z961 Presence of intraocular lens: Secondary | ICD-10-CM | POA: Diagnosis present

## 2014-05-12 DIAGNOSIS — K5900 Constipation, unspecified: Secondary | ICD-10-CM | POA: Diagnosis present

## 2014-05-12 DIAGNOSIS — Z9841 Cataract extraction status, right eye: Secondary | ICD-10-CM | POA: Diagnosis not present

## 2014-05-12 DIAGNOSIS — R0902 Hypoxemia: Secondary | ICD-10-CM | POA: Diagnosis present

## 2014-05-12 DIAGNOSIS — R339 Retention of urine, unspecified: Secondary | ICD-10-CM

## 2014-05-12 DIAGNOSIS — M109 Gout, unspecified: Secondary | ICD-10-CM | POA: Diagnosis present

## 2014-05-12 DIAGNOSIS — N200 Calculus of kidney: Secondary | ICD-10-CM

## 2014-05-12 DIAGNOSIS — D72829 Elevated white blood cell count, unspecified: Secondary | ICD-10-CM | POA: Diagnosis present

## 2014-05-12 DIAGNOSIS — N390 Urinary tract infection, site not specified: Secondary | ICD-10-CM | POA: Diagnosis present

## 2014-05-12 DIAGNOSIS — N400 Enlarged prostate without lower urinary tract symptoms: Secondary | ICD-10-CM | POA: Diagnosis present

## 2014-05-12 DIAGNOSIS — A419 Sepsis, unspecified organism: Secondary | ICD-10-CM | POA: Diagnosis present

## 2014-05-12 DIAGNOSIS — K649 Unspecified hemorrhoids: Secondary | ICD-10-CM | POA: Diagnosis present

## 2014-05-12 DIAGNOSIS — Z79899 Other long term (current) drug therapy: Secondary | ICD-10-CM | POA: Diagnosis not present

## 2014-05-12 DIAGNOSIS — R000 Tachycardia, unspecified: Secondary | ICD-10-CM | POA: Diagnosis present

## 2014-05-12 HISTORY — DX: Unspecified hemorrhoids: K64.9

## 2014-05-12 HISTORY — DX: Calculus of kidney: N20.0

## 2014-05-12 LAB — CBC WITH DIFFERENTIAL/PLATELET
BASOS ABS: 0 10*3/uL (ref 0.0–0.1)
Basophils Relative: 0 % (ref 0–1)
EOS ABS: 0 10*3/uL (ref 0.0–0.7)
EOS PCT: 0 % (ref 0–5)
HCT: 50.1 % (ref 39.0–52.0)
Hemoglobin: 16.7 g/dL (ref 13.0–17.0)
Lymphocytes Relative: 2 % — ABNORMAL LOW (ref 12–46)
Lymphs Abs: 0.5 10*3/uL — ABNORMAL LOW (ref 0.7–4.0)
MCH: 31.7 pg (ref 26.0–34.0)
MCHC: 33.3 g/dL (ref 30.0–36.0)
MCV: 95.2 fL (ref 78.0–100.0)
MONO ABS: 1.3 10*3/uL — AB (ref 0.1–1.0)
Monocytes Relative: 6 % (ref 3–12)
Neutro Abs: 19.1 10*3/uL — ABNORMAL HIGH (ref 1.7–7.7)
Neutrophils Relative %: 92 % — ABNORMAL HIGH (ref 43–77)
Platelets: 206 10*3/uL (ref 150–400)
RBC: 5.26 MIL/uL (ref 4.22–5.81)
RDW: 13.3 % (ref 11.5–15.5)
WBC: 20.9 10*3/uL — ABNORMAL HIGH (ref 4.0–10.5)

## 2014-05-12 LAB — BASIC METABOLIC PANEL
Anion gap: 16 — ABNORMAL HIGH (ref 5–15)
BUN: 18 mg/dL (ref 6–23)
CALCIUM: 9.4 mg/dL (ref 8.4–10.5)
CO2: 26 mEq/L (ref 19–32)
CREATININE: 0.99 mg/dL (ref 0.50–1.35)
Chloride: 101 mEq/L (ref 96–112)
GFR calc Af Amer: 89 mL/min — ABNORMAL LOW (ref >90)
GFR, EST NON AFRICAN AMERICAN: 77 mL/min — AB (ref >90)
GLUCOSE: 201 mg/dL — AB (ref 70–99)
Potassium: 3.9 mEq/L (ref 3.7–5.3)
SODIUM: 143 meq/L (ref 137–147)

## 2014-05-12 LAB — HEPATIC FUNCTION PANEL
ALT: 29 U/L (ref 0–53)
AST: 37 U/L (ref 0–37)
Albumin: 3.1 g/dL — ABNORMAL LOW (ref 3.5–5.2)
Alkaline Phosphatase: 88 U/L (ref 39–117)
Bilirubin, Direct: <0.2 mg/dL (ref 0.0–0.3)
Total Bilirubin: 0.5 mg/dL (ref 0.3–1.2)
Total Protein: 6.1 g/dL (ref 6.0–8.3)

## 2014-05-12 LAB — CLOSTRIDIUM DIFFICILE BY PCR: Toxigenic C. Difficile by PCR: NEGATIVE

## 2014-05-12 LAB — URINALYSIS, ROUTINE W REFLEX MICROSCOPIC
Glucose, UA: NEGATIVE mg/dL
KETONES UR: 15 mg/dL — AB
Nitrite: NEGATIVE
PROTEIN: 100 mg/dL — AB
Specific Gravity, Urine: 1.02 (ref 1.005–1.030)
Urobilinogen, UA: 1 mg/dL (ref 0.0–1.0)
pH: 6 (ref 5.0–8.0)

## 2014-05-12 LAB — URINE MICROSCOPIC-ADD ON

## 2014-05-12 LAB — PROCALCITONIN: Procalcitonin: 1.41 ng/mL

## 2014-05-12 LAB — I-STAT CG4 LACTIC ACID, ED: Lactic Acid, Venous: 1.56 mmol/L (ref 0.5–2.2)

## 2014-05-12 LAB — MAGNESIUM: Magnesium: 2 mg/dL (ref 1.5–2.5)

## 2014-05-12 MED ORDER — TECHNETIUM TC 99M DIETHYLENETRIAME-PENTAACETIC ACID: 46.0000 | Freq: Once | INTRAVENOUS | Status: AC | PRN

## 2014-05-12 MED ORDER — SODIUM CHLORIDE 0.9 % IV SOLN
INTRAVENOUS | Status: AC
  Administered 2014-05-12: 11:00:00 via INTRAVENOUS

## 2014-05-12 MED ORDER — ACETAMINOPHEN 325 MG PO TABS: 650.0000 mg | ORAL_TABLET | Freq: Four times a day (QID) | ORAL | Status: DC | PRN

## 2014-05-12 MED ORDER — MIRABEGRON ER 50 MG PO TB24
50.0000 mg | ORAL_TABLET | Freq: Every day | ORAL | Status: DC
  Administered 2014-05-12: 50 mg via ORAL
  Filled 2014-05-12 (×2): qty 1

## 2014-05-12 MED ORDER — SODIUM CHLORIDE 0.9 % IV SOLN
INTRAVENOUS | Status: DC
  Administered 2014-05-13 – 2014-05-14 (×6): via INTRAVENOUS

## 2014-05-12 MED ORDER — ONDANSETRON HCL 4 MG/2ML IJ SOLN: 4.0000 mg | Freq: Four times a day (QID) | INTRAMUSCULAR | Status: DC | PRN

## 2014-05-12 MED ORDER — SORBITOL 70 % SOLN
30.0000 mL | Freq: Every day | Status: DC | PRN
  Filled 2014-05-12: qty 30

## 2014-05-12 MED ORDER — TAMSULOSIN HCL 0.4 MG PO CAPS: 0.8000 mg | ORAL_CAPSULE | Freq: Every day | ORAL | Status: DC

## 2014-05-12 MED ORDER — VALACYCLOVIR HCL 500 MG PO TABS
500.0000 mg | ORAL_TABLET | Freq: Two times a day (BID) | ORAL | Status: DC | PRN
  Filled 2014-05-12: qty 1

## 2014-05-12 MED ORDER — MORPHINE SULFATE 2 MG/ML IJ SOLN: 1.0000 mg | INTRAMUSCULAR | Status: DC | PRN

## 2014-05-12 MED ORDER — PIPERACILLIN-TAZOBACTAM 3.375 G IVPB
3.3750 g | Freq: Three times a day (TID) | INTRAVENOUS | Status: DC
  Administered 2014-05-12 – 2014-05-14 (×7): 3.375 g via INTRAVENOUS
  Filled 2014-05-12 (×8): qty 50

## 2014-05-12 MED ORDER — LISINOPRIL 10 MG PO TABS
10.0000 mg | ORAL_TABLET | Freq: Every day | ORAL | Status: DC
  Administered 2014-05-12 – 2014-05-14 (×3): 10 mg via ORAL
  Filled 2014-05-12 (×3): qty 1

## 2014-05-12 MED ORDER — ONDANSETRON HCL 4 MG PO TABS: 4.0000 mg | ORAL_TABLET | Freq: Four times a day (QID) | ORAL | Status: DC | PRN

## 2014-05-12 MED ORDER — ADULT MULTIVITAMIN W/MINERALS CH
1.0000 | ORAL_TABLET | Freq: Every day | ORAL | Status: DC
  Administered 2014-05-12 – 2014-05-13 (×2): 1 via ORAL
  Filled 2014-05-12 (×3): qty 1

## 2014-05-12 MED ORDER — ACETAMINOPHEN 650 MG RE SUPP
650.0000 mg | Freq: Four times a day (QID) | RECTAL | Status: DC | PRN
Start: 2014-05-12 — End: 2014-05-14

## 2014-05-12 MED ORDER — ASPIRIN 325 MG PO TABS
325.0000 mg | ORAL_TABLET | Freq: Every day | ORAL | Status: DC
  Administered 2014-05-12 – 2014-05-13 (×2): 325 mg via ORAL
  Filled 2014-05-12 (×3): qty 1

## 2014-05-12 MED ORDER — DEXTROSE 5 % IV SOLN
1.0000 g | Freq: Once | INTRAVENOUS | Status: AC
  Administered 2014-05-12: 1 g via INTRAVENOUS
  Filled 2014-05-12: qty 10

## 2014-05-12 MED ORDER — IOHEXOL 300 MG/ML  SOLN: 50.0000 mL | Freq: Once | INTRAMUSCULAR | Status: AC | PRN

## 2014-05-12 MED ORDER — ENOXAPARIN SODIUM 40 MG/0.4ML ~~LOC~~ SOLN
40.0000 mg | SUBCUTANEOUS | Status: DC
  Administered 2014-05-12 – 2014-05-13 (×2): 40 mg via SUBCUTANEOUS
  Filled 2014-05-12 (×3): qty 0.4

## 2014-05-12 MED ORDER — PANTOPRAZOLE SODIUM 40 MG PO TBEC
40.0000 mg | DELAYED_RELEASE_TABLET | Freq: Every day | ORAL | Status: DC
  Administered 2014-05-13: 40 mg via ORAL
  Filled 2014-05-12 (×2): qty 1

## 2014-05-12 MED ORDER — ALUM & MAG HYDROXIDE-SIMETH 200-200-20 MG/5ML PO SUSP: 30.0000 mL | Freq: Four times a day (QID) | ORAL | Status: DC | PRN

## 2014-05-12 MED ORDER — ZOLPIDEM TARTRATE 5 MG PO TABS: 5.0000 mg | ORAL_TABLET | Freq: Every evening | ORAL | Status: DC | PRN

## 2014-05-12 MED ORDER — FLEET ENEMA 7-19 GM/118ML RE ENEM: 1.0000 | ENEMA | Freq: Once | RECTAL | Status: AC | PRN

## 2014-05-12 MED ORDER — METRONIDAZOLE IN NACL 5-0.79 MG/ML-% IV SOLN
500.0000 mg | Freq: Once | INTRAVENOUS | Status: AC
  Administered 2014-05-12: 500 mg via INTRAVENOUS
  Filled 2014-05-12: qty 100

## 2014-05-12 MED ORDER — DOCUSATE SODIUM 100 MG PO CAPS
100.0000 mg | ORAL_CAPSULE | Freq: Two times a day (BID) | ORAL | Status: DC
  Administered 2014-05-13: 100 mg via ORAL
  Filled 2014-05-12 (×5): qty 1

## 2014-05-12 MED ORDER — ALLOPURINOL 300 MG PO TABS
300.0000 mg | ORAL_TABLET | Freq: Every day | ORAL | Status: DC
  Administered 2014-05-12 – 2014-05-14 (×3): 300 mg via ORAL
  Filled 2014-05-12 (×3): qty 1

## 2014-05-12 MED ORDER — PREDNISOLONE ACETATE 1 % OP SUSP
1.0000 [drp] | Freq: Every day | OPHTHALMIC | Status: DC
  Administered 2014-05-12 – 2014-05-13 (×2): 1 [drp] via OPHTHALMIC
  Filled 2014-05-12: qty 1

## 2014-05-12 MED ORDER — SODIUM CHLORIDE 0.9 % IV BOLUS (SEPSIS)
1000.0000 mL | Freq: Once | INTRAVENOUS | Status: AC
  Administered 2014-05-12: 1000 mL via INTRAVENOUS

## 2014-05-12 MED ORDER — SODIUM CHLORIDE 0.9 % IJ SOLN: 3.0000 mL | Freq: Two times a day (BID) | INTRAMUSCULAR | Status: DC

## 2014-05-12 MED ORDER — FLEET ENEMA 7-19 GM/118ML RE ENEM
1.0000 | ENEMA | Freq: Once | RECTAL | Status: AC
  Administered 2014-05-12: 1 via RECTAL
  Filled 2014-05-12: qty 1

## 2014-05-12 MED ORDER — TECHNETIUM TO 99M ALBUMIN AGGREGATED
5.5000 | Freq: Once | INTRAVENOUS | Status: AC | PRN
  Administered 2014-05-12: 5.5 via INTRAVENOUS

## 2014-05-12 MED ORDER — BISACODYL 5 MG PO TBEC: 5.0000 mg | DELAYED_RELEASE_TABLET | Freq: Every day | ORAL | Status: DC | PRN

## 2014-05-12 MED ORDER — SILODOSIN 8 MG PO CAPS
8.0000 mg | ORAL_CAPSULE | Freq: Every day | ORAL | Status: DC
  Administered 2014-05-12 – 2014-05-13 (×2): 8 mg via ORAL
  Filled 2014-05-12 (×3): qty 1

## 2014-05-12 MED ORDER — OXYCODONE-ACETAMINOPHEN 5-325 MG PO TABS
2.0000 | ORAL_TABLET | Freq: Four times a day (QID) | ORAL | Status: DC | PRN
Start: 2014-05-12 — End: 2014-05-14

## 2014-05-12 MED ORDER — OCUVITE PO TABS
1.0000 | ORAL_TABLET | Freq: Every day | ORAL | Status: DC
  Administered 2014-05-12 – 2014-05-14 (×3): 1 via ORAL
  Filled 2014-05-12 (×3): qty 1

## 2014-05-12 MED ORDER — ALBUTEROL SULFATE (2.5 MG/3ML) 0.083% IN NEBU: 2.5000 mg | INHALATION_SOLUTION | RESPIRATORY_TRACT | Status: DC | PRN

## 2014-05-12 NOTE — ED Notes (Signed)
MD at bedside. 

## 2014-05-12 NOTE — Progress Notes (Signed)
ANTIBIOTIC CONSULT NOTE - INITIAL  Pharmacy Consult for Zosyn Indication: Sepsis  No Known Allergies  Patient Measurements:   Adjusted Body Weight: n/a  Vital Signs: Temp: 98.2 F (36.8 C) (12/17 1019) Temp Source: Oral (12/17 1019) BP: 125/64 mmHg (12/17 1244) Pulse Rate: 87 (12/17 1244) Intake/Output from previous day:   Intake/Output from this shift: Total I/O In: -  Out: 275 [Urine:275]  Labs:  Recent Labs  05/12/14 0807  WBC 20.9*  HGB 16.7  PLT 206  CREATININE 0.99   Estimated Creatinine Clearance: 68.6 mL/min (by C-G formula based on Cr of 0.99). No results for input(s): VANCOTROUGH, VANCOPEAK, VANCORANDOM, GENTTROUGH, GENTPEAK, GENTRANDOM, TOBRATROUGH, TOBRAPEAK, TOBRARND, AMIKACINPEAK, AMIKACINTROU, AMIKACIN in the last 72 hours.   Microbiology: Recent Results (from the past 720 hour(s))  Clostridium Difficile by PCR     Status: None   Collection Time: 05/12/14  8:34 AM  Result Value Ref Range Status   C difficile by pcr NEGATIVE NEGATIVE Final    Comment[facility]    Medical History: Past Medical History  Diagnosis Date  . Nephrolithiasis   . Pre-diabetes   . Gout   . Blindness of left eye   . Hemorrhoids 05/12/2014    Medications:  Infusions:  . sodium chloride 100 mL/hr at 05/12/14 1118  . sodium chloride     Anti-infectives    Start     Dose/Rate Route Frequency Ordered Stop   05/12/14 1100  metroNIDAZOLE (FLAGYL) IVPB 500 mg     500 mg100 mL/hr over 60 Minutes Intravenous  Once 05/12/14 1052 05/12/14 1218   05/12/14 0900  cefTRIAXone (ROCEPHIN) 1 g in dextrose 5 % 50 mL IVPB     1 g100 mL/hr over 30 Minutes Intravenous  Once 05/12/14 0850 05/12/14 1054     Assessment: 79yoM s/p urinary stent placement 12/12 by Dr. Marlou PorchHerrick for ureteral stone.  Complains of inability to urinate since last night.  UA shows leukocytes, red blood cells and bacteria, but urologist feels this is likely d/t stent placement rather than  UTI.  Denies any flank pain at this time.  Pt endorses several episodes of diarrhea since midnight after taking a laxative. Denies any recent antibiotic use or travel.  Rocephin/Flagyl given x 1 in ED; pharmacy now consulted to dose Zosyn for sepsis.  Afebrile since presentation WBC elevated Renal function intact; CrCl 69 ml/min  12/17 blood: sent x 2 12/17 urine: sent 12/17 C.diff: negative   Goal of Therapy:  Eradication of infection, appropriate renal dosing of antibiotics  Plan:  Zosyn 3.375 g IV q8hr by 4-hr infusion starting at 1430 today Follow clinical course, renal function, cultures as available Follow for de-escalation of therapy and LOT  Bernadene Personrew Eusebio Blazejewski, PharmD Pager: [phone] 05/12/2014, 1:38 PM

## 2014-05-12 NOTE — ED Notes (Signed)
Pt transported to Nuc Med for Scan

## 2014-05-12 NOTE — ED Provider Notes (Signed)
CSN: [id number]     Arrival date & time 05/12/14  0604 History   First MD Initiated Contact with Patient 05/12/14 [phone]     Chief Complaint  Patient presents with  . Urinary Retention     (Consider location/radiation/quality/duration/timing/severity/associated sxs/prior Treatment) HPI Comments: Plans are being unable to urinate since last night around 8 PM. His pressure to urinate but cannot go. He endorses lower abdominal pain. No fever, vomiting, testicular pain. No chest pain or shortness of breath. He underwent left ureteral stent placement on December 12 for ureteral stone. He denies any flank pain at this time. No problems with urination until last night. He said several episodes of diarrhea since midnight after taking a laxative. Denies any recent antibiotic use or travel.  The history is provided by the patient.    Past Medical History  Diagnosis Date  . Nephrolithiasis   . Pre-diabetes   . Gout   . Blindness of left eye    Past Surgical History  Procedure Laterality Date  . Cataract extraction    . Intraocular lens insertion Right   . Cystoscopy w/ ureteral stent placement Left 05/07/2014    Procedure: CYSTOSCOPY WITH RETROGRADE PYELOGRAM/ LEFT URETERAL STENT PLACEMENT;  Surgeon: Crist FatBenjamin W Herrick, MD;  Location: [facility];  Service: Urology;  Laterality: Left;   No family history on file. History  Substance Use Topics  . Smoking status: Never Smoker   . Smokeless tobacco: Never Used  . Alcohol Use: No    Review of Systems  Constitutional: Negative for fever, activity change and appetite change.  HENT: Negative for congestion and rhinorrhea.   Eyes: Negative for visual disturbance.  Respiratory: Negative for cough, chest tightness and shortness of breath.   Cardiovascular: Negative for chest pain.  Gastrointestinal: Positive for abdominal pain. Negative for nausea and vomiting.  Genitourinary: Positive for dysuria and difficulty urinating. Negative for urgency.   Musculoskeletal: Positive for back pain. Negative for myalgias and arthralgias.  Skin: Negative for rash.  Neurological: Negative for dizziness, weakness, light-headedness and headaches.  A complete 10 system review of systems was obtained and all systems are negative except as noted in the HPI and PMH.      Allergies  Review of patient's allergies indicates no known allergies.  Home Medications   Prior to Admission medications   Medication Sig Start Date End Date Taking? Authorizing Provider  allopurinol (ZYLOPRIM) 300 MG tablet Take 300 mg by mouth daily.  02/15/14  Yes Historical Provider, MD  aspirin 325 MG tablet Take 325 mg by mouth daily.   Yes Historical Provider, MD  beta carotene w/minerals (OCUVITE) tablet Take 1 tablet by mouth daily.   Yes Historical Provider, MD  bisacodyl (DULCOLAX) 5 MG EC tablet Take 5 mg by mouth daily as needed for moderate constipation.   Yes Historical Provider, MD  furosemide (LASIX) 20 MG tablet Take 20 mg by mouth daily.  02/15/14  Yes Historical Provider, MD  lisinopril (PRINIVIL,ZESTRIL) 10 MG tablet Take 10 mg by mouth daily.  02/15/14  Yes Historical Provider, MD  mirabegron ER (MYRBETRIQ) 50 MG TB24 tablet Take 1 tablet (50 mg total) by mouth daily. 05/08/14  Yes Crist FatBenjamin W Herrick, MD  Multiple Vitamin (MULTIVITAMIN WITH MINERALS) TABS tablet Take 1 tablet by mouth daily.   Yes Historical Provider, MD  oxyCODONE-acetaminophen (PERCOCET/ROXICET) 5-325 MG per tablet Take 2 tablets by mouth every 6 (six) hours as needed for severe pain.  05/05/14  Yes Historical Provider, MD  silodosin (RAPAFLO)  8 MG CAPS capsule Take 8 mg by mouth daily with breakfast.   Yes Historical Provider, MD  valACYclovir (VALTREX) 500 MG tablet Take 500 mg by mouth 2 (two) times daily as needed (cold sores).   Yes Historical Provider, MD  tamsulosin (FLOMAX) 0.4 MG CAPS capsule Take 2 capsules (0.8 mg total) by mouth daily. Patient not taking: Reported on 05/12/2014  05/08/14   Crist Fat, MD   BP 135/74 mmHg  Pulse 104  Temp(Src) 98.2 F (36.8 C) (Oral)  Resp 22  SpO2 91% Physical Exam  Constitutional: He is oriented to person, place, and time. He appears well-developed and well-nourished. No distress.  Uncomfortable, mild tachycardia  HENT:  Head: Normocephalic and atraumatic.  Mouth/Throat: Oropharynx is clear and moist. No oropharyngeal exudate.  Eyes: Conjunctivae and EOM are normal. Pupils are equal, round, and reactive to light.  L eye strabismus  Neck: Normal range of motion. Neck supple.  No meningismus.  Cardiovascular: Normal rate, regular rhythm, normal heart sounds and intact distal pulses.   No murmur heard. Pulmonary/Chest: Effort normal and breath sounds normal. No respiratory distress.  Abdominal: Soft. There is tenderness. There is no rebound and no guarding.  Suprapubic tenderness and fullness  Genitourinary:  No testicular tenderness, no CVA tenderness Large external hemorrhoid, nontender Hard stool palpable at finger tip.  Musculoskeletal: Normal range of motion. He exhibits edema. He exhibits no tenderness.  Chronic LLE swelling at baseline per patient.  (negative workup in past)  Neurological: He is alert and oriented to person, place, and time. No cranial nerve deficit. He exhibits normal muscle tone. Coordination normal.  No ataxia on finger to nose bilaterally. No pronator drift. 5/5 strength throughout. CN 2-12 intact. Negative Romberg. Equal grip strength. Sensation intact. Gait is normal.   Skin: Skin is warm.  Psychiatric: He has a normal mood and affect. His behavior is normal.  Nursing note and vitals reviewed.   ED Course  Procedures (including critical care time) Labs Review Labs Reviewed  URINALYSIS, ROUTINE W REFLEX MICROSCOPIC - Abnormal; Notable for the following:    Color, Urine AMBER (*)    APPearance CLOUDY (*)    Hgb urine dipstick LARGE (*)    Bilirubin Urine SMALL (*)    Ketones, ur  15 (*)    Protein, ur 100 (*)    Leukocytes, UA MODERATE (*)    All other components within normal limits  CBC WITH DIFFERENTIAL - Abnormal; Notable for the following:    WBC 20.9 (*)    Neutrophils Relative % 92 (*)    Neutro Abs 19.1 (*)    Lymphocytes Relative 2 (*)    Lymphs Abs 0.5 (*)    Monocytes Absolute 1.3 (*)    All other components within normal limits  BASIC METABOLIC PANEL - Abnormal; Notable for the following:    Glucose, Bld 201 (*)    GFR calc non Af Amer 77 (*)    GFR calc Af Amer 89 (*)    Anion gap 16 (*)    All other components within normal limits  URINE MICROSCOPIC-ADD ON - Abnormal; Notable for the following:    Bacteria, UA FEW (*)    All other components within normal limits  URINE CULTURE  CLOSTRIDIUM DIFFICILE BY PCR  I-STAT CG4 LACTIC ACID, ED    Imaging Review Ct Abdomen Pelvis Wo Contrast  05/12/2014   CLINICAL DATA:  Pain and urinary retention  EXAM: CT ABDOMEN AND PELVIS WITHOUT CONTRAST  TECHNIQUE: Multidetector  CT imaging of the abdomen and pelvis was performed following the standard protocol without oral or intravenous contrast material administration.  COMPARISON:  May 05, 2014  FINDINGS: There is mild bibasilar lung scarring. Lung bases are otherwise clear.  Liver is prominent, measuring 18.2 cm in length. No focal liver lesions are identified on this noncontrast enhanced study. There is cholelithiasis. The gallbladder wall is not thickened. There is no biliary duct dilatation.  Spleen and adrenals appear normal. There is fatty infiltration in the pancreas. No focal pancreatic mass or inflammatory focus is identified.  There is now a double-J stent on the left extending from the upper pole left renal collecting system to the bladder. There is a 4 mm calculus in the periphery of the left mid kidney. In the lower pole left kidney region, there is a calculus measuring 1.0 by 0.8 cm with adjacent calculus measuring 0.9 x 0.6 cm. There is no  hydronephrosis or mass on the left. There is no appreciable perinephric thickening on the left. No ureteral calculus on the left is seen. Note that the stent could obscure a small calculus. On the right, there is a 4 x 4 mm calculus in the upper to mid portion peripherally. There is a 4 x 3 mm calculus in the anterior mid right kidney as well as a mid nearby 2 x 2 mm calculus in the mid kidney. There is no hydronephrosis or mass in the right. There is no right-sided ureteral calculus.  In the pelvis, urinary bladder is decompressed with a Foley catheter. The distal aspect of the double-J stent is in the bladder posteriorly on the left. There are multiple prostatic calculi present. There are multiple sigmoid diverticula without diverticulitis. The rectum is currently distended with stool. There is perirectal fat stranding, possibly due to either a degree of proctitis or secondary to distention of the rectum from the stool. There is no pelvic mass or fluid collection.  Appendix region appears normal.  There is no bowel obstruction. No free air or portal venous air. There is no appreciable ascites, adenopathy, or abscess in the abdomen or pelvis. There is atherosclerotic change in the aorta but no aneurysm. There is degenerative change in the lumbar spine. There are no blastic or lytic bone lesions.  IMPRESSION: Double-J stent is noted on the left extending from the upper pole left renal collecting system to the urinary bladder. Calculi are noted within the left kidney. A previously noted calculus at the left ureteropelvic junction is now seen with in the lower pole left renal collecting system. No ureteral calculi are identified on either side currently. There are intrarenal calculi bilaterally, larger on the left than on the right. There is no longer perinephric stranding on the left compared to recent prior study.  There is cholelithiasis. Liver is prominent without focal liver lesion on this noncontrast enhanced  study.  There is distention of the rectum with stool. There is thickening of the wall of the rectum with adjacent stranding in the perirectal fat. This finding may be due to reactive change from the rectal distension from stool but also could represent early proctitis.  No bowel obstruction.  No abscess.  Multiple prostatic calculi.   Electronically Signed   By: Bretta BangWilliam  Woodruff M.D.   On: 05/12/2014 08:29   Dg Chest 2 View  05/12/2014   CLINICAL DATA:  Hypertension and hypoxia  EXAM: CHEST  2 VIEW  COMPARISON:  None.  FINDINGS: There is no edema or consolidation. The heart  size and pulmonary vascularity are normal. No adenopathy. There is atherosclerotic change in the aortic arch region. There is degenerative change in the thoracic spine.  IMPRESSION: No edema or consolidation.   Electronically Signed   By: Bretta Bang M.D.   On: 05/12/2014 08:51   Nm Pulmonary Perf And Vent  05/12/2014   CLINICAL DATA:  Hypoxia  EXAM: NUCLEAR MEDICINE VENTILATION - PERFUSION LUNG SCAN  Views: Anterior, posterior, left lateral, right lateral, RPO, LPO, RAO, LAO -ventilation and perfusion  Radionuclide: Technetium 81m DTPA -ventilation; Technetium 65m macroaggregated albumin -perfusion  Dose:  46.0 mCi-ventilation; 5.5 mCi- perfusion  Route of administration: Inhalation-ventilation; intravenous -perfusion  COMPARISON:  Chest radiograph May 12, 2014  FINDINGS: Ventilation: Radiotracer uptake is homogeneous and symmetric bilaterally. No ventilation defects are appreciable.  Perfusion: Radiotracer uptake is homogeneous and symmetric bilaterally. No perfusion defects are appreciable.  IMPRESSION: No appreciable ventilation or perfusion defects. Very low probability of pulmonary embolus.   Electronically Signed   By: Bretta Bang M.D.   On: 05/12/2014 10:24     EKG Interpretation   Date/Time:  Thursday May 12 2014 08:53:20 EST Ventricular Rate:  102 PR Interval:  140 QRS Duration: 78 QT Interval:   340 QTC Calculation: 443 R Axis:   35 Text Interpretation:  Sinus tachycardia Abnormal R-wave progression, early  transition No previous ECGs available Confirmed by Laurella Tull  MD, Heddy Vidana  [phone]) on 05/12/2014 9:35:26 AM      MDM   Final diagnoses:  Urinary retention  Hypoxia   Urinary retention with recent stent placement 5 days ago. Bladder scan shows 340 mL  Foley catheter placed with symptom relief. UA shows leukocytes, red blood cells and bacteria. Systemic white blood cell count 20.  Patient remains borderline tachycardic and hypoxic with O2 sat in the low 90s and heart rate in the 1 teens. He is given IV fluids. D-dimer will be sent. We'll obtain chest x-ray and likely VQ scan given elevated Creatinine. Creatinine improved to 0.99.  CT scan shows double-J stent in place. No ureteral stones. Baird Lyons has improved. Rectal wall thickening with stranding.  Chest x-ray negative. Case discussed with Dr. Isabel Caprice of urology. Patient was seen in office yesterday by Dr. Mena Goes. Dr. Isabel Caprice feels patient's UA appearance is likely due to stent placement rather than UTI.  VQ scan low probability for PE. Lactate normal. Patient remains tachycardic despite fluid resuscitation. We'll be admitted for continued treatment and treatment of proctitis. Antibiotics given.  Dr Janee Morn to admit.  Treat for possible UTI, proctitis and SIRS.  D/w Dr. Mena Goes who will consult.   Glynn Octave, MD 05/12/14 [phone]

## 2014-05-12 NOTE — ED Notes (Signed)
Pt not in room.

## 2014-05-12 NOTE — H&P (Signed)
[facility] History and Physical  Gordon Miyamotoommy C Hamrick ZOX:[id number] DOB: 07/19/1934 DOA: 05/12/2014  Referring physician: Dr. Manus Gunningancour PCP: Pamelia HoitWILSON,FRED HENRY, MD   Chief Complaint: Urinary retention  HPI: Gordon Novak is a 79 y.o. male  With history of nephrolithiasis, left eye blindness, status post cystoscopy with ureteral stent placement on the left 05/07/2014 presented to the ED with a one-day history of inability to urinate which started last night the night prior to admission. Patient stated that he was unable to sleep secondary to this. Patient also with complaints of lower abdominal pain.in and nonradiating. Patient denies any fevers, no chills, no nausea, no vomiting, no chest pain, no shortness of breath, no cough, no hematemesis, no melena, no hematochezia, no weakness. Patient does endorse a bout of constipation and states he took a laxative for this and had multiple loose stools. Patient presented to the emergency room weight was noted to be tachycardic with a CBC with a white count of 20.9. Basic metabolic profile obtained was unremarkable. Lactic acid level was 1.56. Chest x-ray done was negative. CT of the abdomen and pelvis done showed double-J stent on the left extending from the upper pole left renal collecting system to the urinary bladder. Calculi noted within the left kidney. Intrarenal calculi bilaterally left greater than right. No longer perinephric stranding on the left compared to recent study. Cholelithiasis. Distention of the rectum with stool and a thickening of the rectal wall with adjacent stranding in the perirectal fat. No bowel obstruction. No abscess. Multiple prostatic calculi. Urinalysis which was done was nitrite negative moderate leukocytes 11-20 WBCs 21-50 RBCs amber and cloudy in nature. Foley catheter was placed with some improvement and good urine output. Urology was consulted. EDP who felt patient likely did not have a UTI and stated to consult on the  patient. [facility] were called to admit the patient for further evaluation and management.  Review of Systems: As per history of present illness otherwise negative. Constitutional:  No weight loss, night sweats, Fevers, chills, fatigue.  HEENT:  No headaches, Difficulty swallowing,Tooth/dental problems,Sore throat,  No sneezing, itching, ear ache, nasal congestion, post nasal drip,  Cardio-vascular:  No chest pain, Orthopnea, PND, swelling in lower extremities, anasarca, dizziness, palpitations  GI:  No heartburn, indigestion, abdominal pain, nausea, vomiting, diarrhea, change in bowel habits, loss of appetite  Resp:  No shortness of breath with exertion or at rest. No excess mucus, no productive cough, No non-productive cough, No coughing up of blood.No change in color of mucus.No wheezing.No chest wall deformity  Skin:  no rash or lesions.  GU:  no dysuria, change in color of urine, no urgency or frequency. No flank pain.  Musculoskeletal:  No joint pain or swelling. No decreased range of motion. No back pain.  Psych:  No change in mood or affect. No depression or anxiety. No memory loss.   Past Medical History  Diagnosis Date  . Nephrolithiasis   . Pre-diabetes   . Gout   . Blindness of left eye   . Hemorrhoids 05/12/2014   Past Surgical History  Procedure Laterality Date  . Cataract extraction    . Intraocular lens insertion Right   . Cystoscopy w/ ureteral stent placement Left 05/07/2014    Procedure: CYSTOSCOPY WITH RETROGRADE PYELOGRAM/ LEFT URETERAL STENT PLACEMENT;  Surgeon: Crist FatBenjamin W Herrick, MD;  Location: [facility];  Service: Urology;  Laterality: Left;   Social History:  reports that he has never smoked. He has never used smokeless tobacco. He  reports that he does not drink alcohol or use illicit drugs.  No Known Allergies  History reviewed. No pertinent family history.   Prior to Admission medications   Medication Sig Start Date End Date Taking?  Authorizing Provider  allopurinol (ZYLOPRIM) 300 MG tablet Take 300 mg by mouth daily.  02/15/14  Yes Historical Provider, MD  aspirin 325 MG tablet Take 325 mg by mouth daily.   Yes Historical Provider, MD  beta carotene w/minerals (OCUVITE) tablet Take 1 tablet by mouth daily.   Yes Historical Provider, MD  bisacodyl (DULCOLAX) 5 MG EC tablet Take 5 mg by mouth daily as needed for moderate constipation.   Yes Historical Provider, MD  furosemide (LASIX) 20 MG tablet Take 20 mg by mouth daily.  02/15/14  Yes Historical Provider, MD  lisinopril (PRINIVIL,ZESTRIL) 10 MG tablet Take 10 mg by mouth daily.  02/15/14  Yes Historical Provider, MD  mirabegron ER (MYRBETRIQ) 50 MG TB24 tablet Take 1 tablet (50 mg total) by mouth daily. 05/08/14  Yes Crist FatBenjamin W Herrick, MD  Multiple Vitamin (MULTIVITAMIN WITH MINERALS) TABS tablet Take 1 tablet by mouth daily.   Yes Historical Provider, MD  oxyCODONE-acetaminophen (PERCOCET/ROXICET) 5-325 MG per tablet Take 2 tablets by mouth every 6 (six) hours as needed for severe pain.  05/05/14  Yes Historical Provider, MD  silodosin (RAPAFLO) 8 MG CAPS capsule Take 8 mg by mouth daily with breakfast.   Yes Historical Provider, MD  valACYclovir (VALTREX) 500 MG tablet Take 500 mg by mouth 2 (two) times daily as needed (cold sores).   Yes Historical Provider, MD  tamsulosin (FLOMAX) 0.4 MG CAPS capsule Take 2 capsules (0.8 mg total) by mouth daily. Patient not taking: Reported on 05/12/2014 05/08/14   Crist FatBenjamin W Herrick, MD   Physical Exam: Filed Vitals:   05/12/14 0806 05/12/14 0856 05/12/14 1019 05/12/14 1030  BP: 128/61 132/59 125/54 135/74  Pulse: 107 102 95 104  Temp:   98.2 F (36.8 C)   TempSrc:   Oral   Resp: 18 17 20 22   SpO2: 91% 92% 91% 91%    Wt Readings from Last 3 Encounters:  05/07/14 87.998 kg (194 lb)    General:  Well-developed well-nourished in no acute cardiopulmonary distress. Speaking in full sentences. Eyes: PERRLA, EOMI, normal lids,  irises & conjunctiva. Left eye strabismus ENT: grossly normal hearing, lips & tongue. Dry mucous membranes. Neck: no LAD, masses or thyromegaly Cardiovascular: Tachycardic, no m/r/g. No LE edema. Telemetry: Sinus tachycardia Respiratory: CTA bilaterally, no w/r/r. Normal respiratory effort. Abdomen: soft, nd, positive bowel sounds, no rebound, no guarding. Some tenderness in the lower abdominal region with palpation. Skin: no rash or induration seen on limited exam Musculoskeletal: grossly normal tone BUE/BLE Psychiatric: grossly normal mood and affect, speech fluent and appropriate Neurologic: Alert and oriented 3. Cranial nerves II through XII are grossly intact. Sensation is intact. Visual fields are intact. No focal deficit.          Labs on Admission:  Basic Metabolic Panel:  Recent Labs Lab 05/07/14 1035 05/08/14 0502 05/12/14 0807  NA 138 141 143  K 4.4 4.4 3.9  CL 101 106 101  CO2 24 26 26   GLUCOSE 180* 122* 201*  BUN 21 17 18   CREATININE 1.48* 1.24 0.99  CALCIUM 9.1 8.6 9.4   Liver Function Tests:  Recent Labs Lab 05/07/14 1035  AST 25  ALT 26  ALKPHOS 81  BILITOT 0.4  PROT 6.5  ALBUMIN 3.4*    Recent  Labs Lab 05/07/14 1035  LIPASE 20   No results for input(s): AMMONIA in the last 168 hours. CBC:  Recent Labs Lab 05/07/14 1035 05/08/14 0502 05/12/14 0807  WBC 10.3 7.4 20.9*  NEUTROABS 8.6*  --  19.1*  HGB 16.3 13.5 16.7  HCT 47.7 41.3 50.1  MCV 94.1 95.6 95.2  PLT 176 144* 206   Cardiac Enzymes: No results for input(s): CKTOTAL, CKMB, CKMBINDEX, TROPONINI in the last 168 hours.  BNP (last 3 results) No results for input(s): PROBNP in the last 8760 hours. CBG: No results for input(s): GLUCAP in the last 168 hours.  Radiological Exams on Admission: Ct Abdomen Pelvis Wo Contrast  05/12/2014   CLINICAL DATA:  Pain and urinary retention  EXAM: CT ABDOMEN AND PELVIS WITHOUT CONTRAST  TECHNIQUE: Multidetector CT imaging of the abdomen and  pelvis was performed following the standard protocol without oral or intravenous contrast material administration.  COMPARISON:  May 05, 2014  FINDINGS: There is mild bibasilar lung scarring. Lung bases are otherwise clear.  Liver is prominent, measuring 18.2 cm in length. No focal liver lesions are identified on this noncontrast enhanced study. There is cholelithiasis. The gallbladder wall is not thickened. There is no biliary duct dilatation.  Spleen and adrenals appear normal. There is fatty infiltration in the pancreas. No focal pancreatic mass or inflammatory focus is identified.  There is now a double-J stent on the left extending from the upper pole left renal collecting system to the bladder. There is a 4 mm calculus in the periphery of the left mid kidney. In the lower pole left kidney region, there is a calculus measuring 1.0 by 0.8 cm with adjacent calculus measuring 0.9 x 0.6 cm. There is no hydronephrosis or mass on the left. There is no appreciable perinephric thickening on the left. No ureteral calculus on the left is seen. Note that the stent could obscure a small calculus. On the right, there is a 4 x 4 mm calculus in the upper to mid portion peripherally. There is a 4 x 3 mm calculus in the anterior mid right kidney as well as a mid nearby 2 x 2 mm calculus in the mid kidney. There is no hydronephrosis or mass in the right. There is no right-sided ureteral calculus.  In the pelvis, urinary bladder is decompressed with a Foley catheter. The distal aspect of the double-J stent is in the bladder posteriorly on the left. There are multiple prostatic calculi present. There are multiple sigmoid diverticula without diverticulitis. The rectum is currently distended with stool. There is perirectal fat stranding, possibly due to either a degree of proctitis or secondary to distention of the rectum from the stool. There is no pelvic mass or fluid collection.  Appendix region appears normal.  There is no  bowel obstruction. No free air or portal venous air. There is no appreciable ascites, adenopathy, or abscess in the abdomen or pelvis. There is atherosclerotic change in the aorta but no aneurysm. There is degenerative change in the lumbar spine. There are no blastic or lytic bone lesions.  IMPRESSION: Double-J stent is noted on the left extending from the upper pole left renal collecting system to the urinary bladder. Calculi are noted within the left kidney. A previously noted calculus at the left ureteropelvic junction is now seen with in the lower pole left renal collecting system. No ureteral calculi are identified on either side currently. There are intrarenal calculi bilaterally, larger on the left than on the right. There  is no longer perinephric stranding on the left compared to recent prior study.  There is cholelithiasis. Liver is prominent without focal liver lesion on this noncontrast enhanced study.  There is distention of the rectum with stool. There is thickening of the wall of the rectum with adjacent stranding in the perirectal fat. This finding may be due to reactive change from the rectal distension from stool but also could represent early proctitis.  No bowel obstruction.  No abscess.  Multiple prostatic calculi.   Electronically Signed   By: Bretta Bang M.D.   On: 05/12/2014 08:29   Dg Chest 2 View  05/12/2014   CLINICAL DATA:  Hypertension and hypoxia  EXAM: CHEST  2 VIEW  COMPARISON:  None.  FINDINGS: There is no edema or consolidation. The heart size and pulmonary vascularity are normal. No adenopathy. There is atherosclerotic change in the aortic arch region. There is degenerative change in the thoracic spine.  IMPRESSION: No edema or consolidation.   Electronically Signed   By: Bretta Bang M.D.   On: 05/12/2014 08:51   Nm Pulmonary Perf And Vent  05/12/2014   CLINICAL DATA:  Hypoxia  EXAM: NUCLEAR MEDICINE VENTILATION - PERFUSION LUNG SCAN  Views: Anterior, posterior,  left lateral, right lateral, RPO, LPO, RAO, LAO -ventilation and perfusion  Radionuclide: Technetium 43m DTPA -ventilation; Technetium 30m macroaggregated albumin -perfusion  Dose:  46.0 mCi-ventilation; 5.5 mCi- perfusion  Route of administration: Inhalation-ventilation; intravenous -perfusion  COMPARISON:  Chest radiograph May 12, 2014  FINDINGS: Ventilation: Radiotracer uptake is homogeneous and symmetric bilaterally. No ventilation defects are appreciable.  Perfusion: Radiotracer uptake is homogeneous and symmetric bilaterally. No perfusion defects are appreciable.  IMPRESSION: No appreciable ventilation or perfusion defects. Very low probability of pulmonary embolus.   Electronically Signed   By: Bretta Bang M.D.   On: 05/12/2014 10:24    EKG: Independently reviewed. Sinus tachycardia  Assessment/Plan Principal Problem:   SIRS (systemic inflammatory response syndrome) Active Problems:   Nephrolithiasis   Urinary tract infection   Leukocytosis   Tachycardia   Constipation   Hemorrhoids   Gout  #1 systemic inflammatory response syndrome Patient is present in the knees criteria for systemic inflammatory response syndrome with tachycardia and a leukocytosis with a white count of 20.9. Patient also noted to have recent instrumentation with stent placement. CT of the abdomen and pelvis consistent with calculi. Urinalysis is worrisome for UTI. Will check urine cultures. Check blood cultures 2. Chest x-ray is negative for any acute infiltrate. Will place patient empirically on IV Zosyn. Follow.  #2 probably urinary tract infection Check urine cultures. Place on IV Zosyn.  #3 leukocytosis Unknown etiology. Will panculture. Chest x-ray is negative. Placed empirically on IV Zosyn.  #4 sinus tachycardia IV secondary to problem #1. Improving with hydration.  #5 constipation Will place patient on soapsuds enema. Bowel regimen. Follow.  #6 history of gout Stable. Continue  allopurinol.  #7 left proximal obstructing stone status post cystoscopy, left urethral stent placement, left retrograde pyelography with interpretation Patient had these procedures done on 05/07/2014. Patient to be assessed by urology.  #8 prophylaxis PPI for GI prophylaxis. Lovenox for DVT prophylaxis.   Code Status: Full DVT Prophylaxis: Lovenox. Family Communication: Updated patient and wife at bedside. Disposition Plan: Admit to telemetry.  Time spent: 70 minutes.  [facility] MD [facility] Pager [phone]

## 2014-05-12 NOTE — ED Notes (Signed)
Patient transported to X-ray 

## 2014-05-12 NOTE — ED Notes (Signed)
Pt had a urinary stent placed on Saturday for a kidney stone. Pt has been unable to urinate since last night around 2000.

## 2014-05-13 ENCOUNTER — Other Ambulatory Visit: Payer: Self-pay | Admitting: Urology

## 2014-05-13 DIAGNOSIS — M1000 Idiopathic gout, unspecified site: Secondary | ICD-10-CM

## 2014-05-13 LAB — CBC
HEMATOCRIT: 40.6 % (ref 39.0–52.0)
HEMOGLOBIN: 13.5 g/dL (ref 13.0–17.0)
MCH: 31.5 pg (ref 26.0–34.0)
MCHC: 33.3 g/dL (ref 30.0–36.0)
MCV: 94.9 fL (ref 78.0–100.0)
Platelets: 159 10*3/uL (ref 150–400)
RBC: 4.28 MIL/uL (ref 4.22–5.81)
RDW: 13.5 % (ref 11.5–15.5)
WBC: 11.1 10*3/uL — AB (ref 4.0–10.5)

## 2014-05-13 LAB — COMPREHENSIVE METABOLIC PANEL
ALT: 28 U/L (ref 0–53)
ANION GAP: 11 (ref 5–15)
AST: 59 U/L — ABNORMAL HIGH (ref 0–37)
Albumin: 2.5 g/dL — ABNORMAL LOW (ref 3.5–5.2)
Alkaline Phosphatase: 66 U/L (ref 39–117)
BUN: 16 mg/dL (ref 6–23)
CALCIUM: 8.4 mg/dL (ref 8.4–10.5)
CO2: 25 mEq/L (ref 19–32)
Chloride: 108 mEq/L (ref 96–112)
Creatinine, Ser: 0.94 mg/dL (ref 0.50–1.35)
GFR calc non Af Amer: 79 mL/min — ABNORMAL LOW (ref >90)
Glucose, Bld: 132 mg/dL — ABNORMAL HIGH (ref 70–99)
Potassium: 3.6 mEq/L — ABNORMAL LOW (ref 3.7–5.3)
Sodium: 144 mEq/L (ref 137–147)
TOTAL PROTEIN: 5.1 g/dL — AB (ref 6.0–8.3)
Total Bilirubin: 1.1 mg/dL (ref 0.3–1.2)

## 2014-05-13 LAB — GLUCOSE, CAPILLARY: GLUCOSE-CAPILLARY: 117 mg/dL — AB (ref 70–99)

## 2014-05-13 LAB — URINE CULTURE
Colony Count: NO GROWTH
Culture: NO GROWTH

## 2014-05-13 MED ORDER — POTASSIUM CHLORIDE CRYS ER 20 MEQ PO TBCR
40.0000 meq | EXTENDED_RELEASE_TABLET | ORAL | Status: AC
  Administered 2014-05-13 – 2014-05-14 (×3): 40 meq via ORAL
  Filled 2014-05-13 (×3): qty 2

## 2014-05-13 NOTE — Anesthesia Preprocedure Evaluation (Addendum)
Anesthesia Evaluation  Patient identified by MRN, date of birth, ID band Patient awake    Reviewed: Allergy & Precautions, H&P , NPO status , Patient's Chart, lab work & pertinent test results  History of Anesthesia Complications Negative for: history of anesthetic complications  Airway Mallampati: II  TM Distance: >3 FB Neck ROM: Full    Dental no notable dental hx. (+) Poor Dentition, Dental Advisory Given,    Pulmonary neg pulmonary ROS,  breath sounds clear to auscultation  Pulmonary exam normal       Cardiovascular Exercise Tolerance: Good hypertension, Pt. on medications + Peripheral Vascular Disease Rhythm:Regular Rate:Normal     Neuro/Psych negative neurological ROS  negative psych ROS   GI/Hepatic negative GI ROS, Neg liver ROS,   Endo/Other  negative endocrine ROS  Renal/GU Renal disease  negative genitourinary   Musculoskeletal negative musculoskeletal ROS (+)   Abdominal   Peds negative pediatric ROS (+)  Hematology negative hematology ROS (+)   Anesthesia Other Findings   Reproductive/Obstetrics negative OB ROS                            Anesthesia Physical Anesthesia Plan  ASA: II  Anesthesia Plan: General   Post-op Pain Management:    Induction: Intravenous  Airway Management Planned: LMA  Additional Equipment:   Intra-op Plan:   Post-operative Plan: Extubation in OR  Informed Consent: I have reviewed the patients History and Physical, chart, labs and discussed the procedure including the risks, benefits and alternatives for the proposed anesthesia with the patient or authorized representative who has indicated his/her understanding and acceptance.   Dental advisory given  Plan Discussed with: CRNA  Anesthesia Plan Comments:         Anesthesia Quick Evaluation

## 2014-05-13 NOTE — Consult Note (Signed)
Consult: urinary retention, abnormal U/A   History of Present Illness: Pt admit for hypoxia, elevated wbc count and possible proctitis. He underwent cystoscopy with left ureteral stent placement for left proximal ureteral stone last weekend.  Since then he had not had a bowel movement.This likely affected his urination and he could not void yesterday and is having a lot of Abdominal pain to came back to the hospital.CT scan of the abdomen and pelvis showed possible early proctitis.  Left ureteral stent was in good position.  Bladder drained with Foley and in good position.  I reviewed the images.  He has a history of BPH and takes Rapaflo.  He also has chronic left lower extremity edema and given the tachycardia and hypoxia V/Q scan was done which was low probability for PE. I reviewed the images. Patient denies any chest pain or shortness of breath.    He had no dysuria or gross hematuria yesterday but really couldn't void at all. A Foley was placed and only drained a PVR 340 ml.This kidney function was normal. UA showed some white cells, red cells and rare bacteria which is consistent with a post-op appearance/ureteral stent. May 11, 2014 urine CULTURE in the office was NEGATIVE.   Today, he tells me he had multiple bowel movements yesterday and last night and feels much better.  Foley catheter is in place.  Past Medical History  Diagnosis Date  . Nephrolithiasis   . Pre-diabetes   . Gout   . Blindness of left eye   . Hemorrhoids 05/12/2014  . Kidney stones    Past Surgical History  Procedure Laterality Date  . Cataract extraction    . Intraocular lens insertion Right   . Cystoscopy w/ ureteral stent placement Left 05/07/2014    Procedure: CYSTOSCOPY WITH RETROGRADE PYELOGRAM/ LEFT URETERAL STENT PLACEMENT;  Surgeon: Crist FatBenjamin W Herrick, MD;  Location: [facility];  Service: Urology;  Laterality: Left;    Home Medications:  Prescriptions prior to admission  Medication Sig Dispense Refill  Last Dose  . allopurinol (ZYLOPRIM) 300 MG tablet Take 300 mg by mouth daily.   3 05/11/2014 at Unknown time  . aspirin 325 MG tablet Take 325 mg by mouth daily.   05/11/2014 at Unknown time  . beta carotene w/minerals (OCUVITE) tablet Take 1 tablet by mouth daily.   05/11/2014 at Unknown time  . bisacodyl (DULCOLAX) 5 MG EC tablet Take 5 mg by mouth daily as needed for moderate constipation.   05/11/2014 at Unknown time  . furosemide (LASIX) 20 MG tablet Take 20 mg by mouth daily.   3 05/11/2014 at Unknown time  . lisinopril (PRINIVIL,ZESTRIL) 10 MG tablet Take 10 mg by mouth daily.   3 05/11/2014 at Unknown time  . mirabegron ER (MYRBETRIQ) 50 MG TB24 tablet Take 1 tablet (50 mg total) by mouth daily. 30 tablet 0 05/11/2014 at Unknown time  . Multiple Vitamin (MULTIVITAMIN WITH MINERALS) TABS tablet Take 1 tablet by mouth daily.   05/11/2014 at Unknown time  . oxyCODONE-acetaminophen (PERCOCET/ROXICET) 5-325 MG per tablet Take 2 tablets by mouth every 6 (six) hours as needed for severe pain.    Past Week at Unknown time  . silodosin (RAPAFLO) 8 MG CAPS capsule Take 8 mg by mouth daily with breakfast.   05/11/2014 at Unknown time  . valACYclovir (VALTREX) 500 MG tablet Take 500 mg by mouth 2 (two) times daily as needed (cold sores).   unknown  . tamsulosin (FLOMAX) 0.4 MG CAPS capsule Take 2  capsules (0.8 mg total) by mouth daily. (Patient not taking: Reported on 05/12/2014) 60 capsule 0 Not Taking at Unknown time   Allergies: No Known Allergies  History reviewed. No pertinent family history. Social History:  reports that he has never smoked. He has never used smokeless tobacco. He reports that he does not drink alcohol or use illicit drugs.  ROS: A complete review of systems was performed.  All systems are negative except for pertinent findings as noted. ROS   Physical Exam:  Vital signs in last 24 hours: Temp:  [98 F (36.7 C)-98.3 F (36.8 C)] 98 F (36.7 C) (12/18 0630) Pulse Rate:   [71-107] 71 (12/18 0630) Resp:  [13-22] 16 (12/18 0630) BP: (121-135)/(53-74) 123/53 mmHg (12/18 0630) SpO2:  [91 %-96 %] 93 % (12/18 0630) Weight:  [90.447 kg (199 lb 6.4 oz)-90.901 kg (200 lb 6.4 oz)] 90.901 kg (200 lb 6.4 oz) (12/18 0630) General:  Alert and oriented, No acute distress HEENT: Normocephalic, atraumatic Neck: No JVD or lymphadenopathy Cardiovascular: Regular rate and rhythm Lungs: Regular rate and effort Abdomen: Soft, nontender, nondistended, no abdominal masses Back: No CVA tenderness Extremities: No edema Neurologic: Grossly intact  Laboratory Data:  Results for orders placed or performed during the hospital encounter of 05/12/14 (from the past 24 hour(s))  CBC with Differential     Status: Abnormal   Collection Time: 05/12/14  8:07 AM  Result Value Ref Range   WBC 20.9 (H) 4.0 - 10.5 K/uL   RBC 5.26 4.22 - 5.81 MIL/uL   Hemoglobin 16.7 13.0 - 17.0 g/dL   HCT 09.8 11.9 - 14.7 %   MCV 95.2 78.0 - 100.0 fL   MCH 31.7 26.0 - 34.0 pg   MCHC 33.3 30.0 - 36.0 g/dL   RDW 82.9 56.2 - 13.0 %   Platelets 206 150 - 400 K/uL   Neutrophils Relative % 92 (H) 43 - 77 %   Neutro Abs 19.1 (H) 1.7 - 7.7 K/uL   Lymphocytes Relative 2 (L) 12 - 46 %   Lymphs Abs 0.5 (L) 0.7 - 4.0 K/uL   Monocytes Relative 6 3 - 12 %   Monocytes Absolute 1.3 (H) 0.1 - 1.0 K/uL   Eosinophils Relative 0 0 - 5 %   Eosinophils Absolute 0.0 0.0 - 0.7 K/uL   Basophils Relative 0 0 - 1 %   Basophils Absolute 0.0 0.0 - 0.1 K/uL  Basic metabolic panel     Status: Abnormal   Collection Time: 05/12/14  8:07 AM  Result Value Ref Range   Sodium 143 137 - 147 mEq/L   Potassium 3.9 3.7 - 5.3 mEq/L   Chloride 101 96 - 112 mEq/L   CO2 26 19 - 32 mEq/L   Glucose, Bld 201 (H) 70 - 99 mg/dL   BUN 18 6 - 23 mg/dL   Creatinine, Ser 8.65 0.50 - 1.35 mg/dL   Calcium 9.4 8.4 - 78.4 mg/dL   GFR calc non Af Amer 77 (L) >90 mL/min   GFR calc Af Amer 89 (L) >90 mL/min   Anion gap 16 (H) 5 - 15  Clostridium  Difficile by PCR     Status: None   Collection Time: 05/12/14  8:34 AM  Result Value Ref Range   C difficile by pcr NEGATIVE NEGATIVE  I-Stat CG4 Lactic Acid, ED     Status: None   Collection Time: 05/12/14 10:24 AM  Result Value Ref Range   Lactic Acid, Venous 1.56 0.5 - 2.2  mmol/L  Procalcitonin - Baseline     Status: None   Collection Time: 05/12/14 12:25 PM  Result Value Ref Range   Procalcitonin 1.41 ng/mL  Hepatic function panel     Status: Abnormal   Collection Time: 05/12/14 12:25 PM  Result Value Ref Range   Total Protein 6.1 6.0 - 8.3 g/dL   Albumin 3.1 (L) 3.5 - 5.2 g/dL   AST 37 0 - 37 U/L   ALT 29 0 - 53 U/L   Alkaline Phosphatase 88 39 - 117 U/L   Total Bilirubin 0.5 0.3 - 1.2 mg/dL   Bilirubin, Direct <1.6 0.0 - 0.3 mg/dL   Indirect Bilirubin NOT CALCULATED 0.3 - 0.9 mg/dL  Magnesium     Status: None   Collection Time: 05/12/14 12:25 PM  Result Value Ref Range   Magnesium 2.0 1.5 - 2.5 mg/dL  Comprehensive metabolic panel     Status: Abnormal   Collection Time: 05/13/14  4:41 AM  Result Value Ref Range   Sodium 144 137 - 147 mEq/L   Potassium 3.6 (L) 3.7 - 5.3 mEq/L   Chloride 108 96 - 112 mEq/L   CO2 25 19 - 32 mEq/L   Glucose, Bld 132 (H) 70 - 99 mg/dL   BUN 16 6 - 23 mg/dL   Creatinine, Ser 1.09 0.50 - 1.35 mg/dL   Calcium 8.4 8.4 - 60.4 mg/dL   Total Protein 5.1 (L) 6.0 - 8.3 g/dL   Albumin 2.5 (L) 3.5 - 5.2 g/dL   AST 59 (H) 0 - 37 U/L   ALT 28 0 - 53 U/L   Alkaline Phosphatase 66 39 - 117 U/L   Total Bilirubin 1.1 0.3 - 1.2 mg/dL   GFR calc non Af Amer 79 (L) >90 mL/min   GFR calc Af Amer >90 >90 mL/min   Anion gap 11 5 - 15  CBC     Status: Abnormal   Collection Time: 05/13/14  4:41 AM  Result Value Ref Range   WBC 11.1 (H) 4.0 - 10.5 K/uL   RBC 4.28 4.22 - 5.81 MIL/uL   Hemoglobin 13.5 13.0 - 17.0 g/dL   HCT 54.0 98.1 - 19.1 %   MCV 94.9 78.0 - 100.0 fL   MCH 31.5 26.0 - 34.0 pg   MCHC 33.3 30.0 - 36.0 g/dL   RDW 47.8 29.5 - 62.1 %    Platelets 159 150 - 400 K/uL   Recent Results (from the past 240 hour(s))  Clostridium Difficile by PCR     Status: None   Collection Time: 05/12/14  8:34 AM  Result Value Ref Range Status   C difficile by pcr NEGATIVE NEGATIVE Final    Comment: Performed at [facility]   Creatinine:  Recent Labs  05/07/14 1035 05/08/14 0502 05/12/14 0807 05/13/14 0441  CREATININE 1.48* 1.24 0.99 0.94    Impression/Assessment/Plan:  leukocytosis, tachycardia-likely related to pain and Constipation which seems to be improving. Urine Cx was negative from office 12/16. I really appreciate hospitalist care.   Abnormal U/A - post-op. As above.   Urinary retention-likely related to constipation. On Rapaflo. Will d/c Myrbetriq for now as we don't want to inhibit bladder contractions. Will leave foley and d/c tomorrow post-op.    Left ureteral stones, ureteral stent-stable.  Patient scheduled for definitive stone surgery January 12, however I checked with OR and there is time in the AM to go ahead with the procedure (left ureteroscopy). I'll plan to proceed in AM with  ureteroscopy, laser litho and stent and pt could go home tomorrow from GU pt of view. I also discussed alternatives with the patient and family such as shock wave lithotripsy and PCNL.     Magan Winnett 05/13/2014, 7:53 AM

## 2014-05-13 NOTE — Evaluation (Signed)
Physical Therapy Evaluation Patient Details Name: Gordon Novak MRN: [id number] DOB: 01/28/1935 Today's Date: 05/13/2014   History of Present Illness  79 yo male adm with SIRS; PMHx: gout, L eye blindness  Clinical Impression  Pt admitted with above diagnosis. Pt currently with functional limitations due to the deficits listed below (see PT Problem List).  Pt will benefit from skilled PT to increase their independence and safety with mobility to allow discharge to the venue listed below.       Follow Up Recommendations No PT follow up    Equipment Recommendations       Recommendations for Other Services       Precautions / Restrictions Precautions Precautions: Fall      Mobility  Bed Mobility Overal bed mobility: Modified Independent                Transfers Overall transfer level: Needs assistance Equipment used: None Transfers: Sit to/from Stand Sit to Stand: Min guard         General transfer comment: to steady once standing, slight postural sway  Ambulation/Gait Ambulation/Gait assistance: Min guard Ambulation Distance (Feet): 200 Feet Assistive device: None Gait Pattern/deviations: Step-through pattern     General Gait Details: min/guard for safety and balance  Stairs            Wheelchair Mobility    Modified Rankin (Stroke Patients Only)       Balance Overall balance assessment: Needs assistance           Standing balance-Leahy Scale: Good               High level balance activites: Backward walking;Direction changes;Turns High Level Balance Comments: min/guard for balance turns;               Pertinent Vitals/Pain Pain Assessment: No/denies pain    Home Living Family/patient expects to be discharged to:: Private residence Living Arrangements: Spouse/significant other   Type of Home: House Home Access: Stairs to enter Entrance Stairs-Rails: Right Entrance Stairs-Number of Steps: 5   Home Equipment:  None      Prior Function Level of Independence: Independent         Comments: pt very active, has  a garden, enjoys working outside     Higher education careers adviserHand Dominance        Extremity/Trunk Assessment   Upper Extremity Assessment: Overall WFL for tasks assessed           Lower Extremity Assessment: Overall WFL for tasks assessed         Communication   Communication: No difficulties  Cognition Arousal/Alertness: Awake/alert Behavior During Therapy: WFL for tasks assessed/performed Overall Cognitive Status: Within Functional Limits for tasks assessed                      General Comments General comments (skin integrity, edema, etc.): pt slightly unsteady but pt felt d/t having not bee up ambulating    Exercises        Assessment/Plan    PT Assessment Patient needs continued PT services  PT Diagnosis Difficulty walking   PT Problem List Decreased balance;Decreased mobility  PT Treatment Interventions Gait training;Functional mobility training;Therapeutic activities;Therapeutic exercise;Patient/family education;Stair training   PT Goals (Current goals can be found in the Care Plan section) Acute Rehab PT Goals Patient Stated Goal: get better and go home PT Goal Formulation: With patient Time For Goal Achievement: 05/20/14 Potential to Achieve Goals: Good    Frequency Min 3X/week  Barriers to discharge        Co-evaluation               End of Session Equipment Utilized During Treatment: Gait belt Activity Tolerance: Patient tolerated treatment well Patient left: in chair;with call bell/phone within reach;with chair alarm set Nurse Communication: Mobility status         Time: [id number] PT Time Calculation (min) (ACUTE ONLY): 18 min   Charges:   PT Evaluation $Initial PT Evaluation Tier I: 1 Procedure PT Treatments $Gait Training: 8-22 mins   PT G Codes:          Rosalin Buster 05/13/2014, 1:31 PM

## 2014-05-13 NOTE — Progress Notes (Signed)
Utilization review completed.  

## 2014-05-13 NOTE — Care Management Note (Signed)
    Page 1 of 1   05/13/2014     5:35:06 PM CARE MANAGEMENT NOTE 05/13/2014  Patient:  Gordon Novak,Gordon Novak   Account Number:  [id number]  Date Initiated:  05/13/2014  Documentation initiated by:  Lanier ClamMAHABIR,Jordanny Waddington  Subjective/Objective Assessment:   79 y/o m admitted w/SIRS.recent cysto/stent.     Action/Plan:   From home.   Anticipated DC Date:  05/16/2014   Anticipated DC Plan:  HOME/SELF CARE      DC Planning Services  CM consult      Choice offered to / List presented to:             Status of service:  In process, will continue to follow Medicare Important Message given?   (If response is "NO", the following Medicare IM given date fields will be blank) Date Medicare IM given:   Medicare IM given by:   Date Additional Medicare IM given:   Additional Medicare IM given by:    Discharge Disposition:    Per UR Regulation:  Reviewed for med. necessity/level of care/duration of stay  If discussed at Long Length of Stay Meetings, dates discussed:    Comments:  05/13/14 Lanier ClamKathy Lynden Carrithers RN BSN NCM [phone] Monitor progess. Recommend PT cons.

## 2014-05-13 NOTE — Evaluation (Signed)
Occupational Therapy Evaluation Patient Details Name: Gordon Novak MRN: [id number] DOB: 09/14/1934 Today's Date: 05/13/2014    History of Present Illness 79 yo male adm with SIRS; PMHx: gout, L eye blindness   Clinical Impression   Pt was admitted for the above.  He is overall supervision for ADLs and min guard for toilet and shower transfers for safety:  Pt slightly unsteady but no LOB noted.  Pt should progress well and reach mod I to independent level. He has assist at home if needed, and can use 3:1 in shower as a seat, if he feels weak/unsteady.      Follow Up Recommendations  No OT follow up    Equipment Recommendations  None recommended by OT    Recommendations for Other Services       Precautions / Restrictions Precautions Precautions: Fall Restrictions Weight Bearing Restrictions: No      Mobility Bed Mobility Overal bed mobility: Modified Independent                Transfers Overall transfer level: Needs assistance Equipment used: None Transfers: Sit to/from Stand Sit to Stand: Supervision         General transfer comment: assist for lines/catheter    Balance Overall balance assessment: Needs assistance           Standing balance-Leahy Scale: Good                High Level Balance Comments: min guard for safety ambulating             ADL Overall ADL's : Needs assistance/impaired                         Toilet Transfer: Min guard;Ambulation;Comfort height toilet       Tub/ Shower Transfer: Walk-in shower;Min guard     General ADL Comments: Pt is able to complete ADLs with set up/supervision for sit to stand.  He was min guard for safety ambulating to bathroom for transfers. No LOB, but slightly unsteady.  Pt does have a 3:1 commode that he can use as a shower seat if needed.       Vision                     Perception     Praxis      Pertinent Vitals/Pain Pain Assessment: No/denies pain      Hand Dominance     Extremity/Trunk Assessment Upper Extremity Assessment Upper Extremity Assessment: Overall WFL for tasks assessed          Communication Communication Communication: No difficulties   Cognition Arousal/Alertness: Awake/alert Behavior During Therapy: WFL for tasks assessed/performed Overall Cognitive Status: Within Functional Limits for tasks assessed                     General Comments       Exercises       Shoulder Instructions      Home Living Family/patient expects to be discharged to:: Private residence Living Arrangements: Spouse/significant other Available Help at Discharge: Home health               Bathroom Shower/Tub: Walk-in shower   Bathroom Toilet: Handicapped height     Home Equipment: None          Prior Functioning/Environment Level of Independence: Independent             OT Diagnosis: Generalized weakness   OT  Problem List:     OT Treatment/Interventions:      OT Goals(Current goals can be found in the care plan section) Acute Rehab OT Goals Patient Stated Goal: get better and go home  OT Frequency:     Barriers to D/C:            Co-evaluation              End of Session    Activity Tolerance: Patient tolerated treatment well Patient left: in bed;with call bell/phone within reach;with bed alarm set   Time: 1434-1443 OT Time Calculation (min): 9 min Charges:  OT General Charges $OT Visit: 1 Procedure OT Evaluation $Initial OT Evaluation Tier I: 1 Procedure G-Codes:    Gordon Novak 05/13/2014, 3:59 PM  Gordon Novak, OTR/L [phone] 05/13/2014

## 2014-05-13 NOTE — Progress Notes (Signed)
[facility] PROGRESS NOTE  Gordon DAHMS ZOX:[id number] DOB: Oct 21, 1934 DOA: 05/12/2014 PCP: Pamelia Hoit, MD  Assessment/Plan: #1 systemic inflammatory response syndrome -Patient is present in the SIRS criteria (with tachycardia and a leukocytosis of 20.9). Patient also noted to have recent instrumentation with stent placement.  -CT of the abdomen and pelvis consistent with left calculi.  -Urinalysis is worrisome for UTI.  -good response to IVF's and empiric zosyn (which would be continue for now) -follow blood and urine cx's -follow clinical response  #2 presumed urinary tract infection and urinary rentention -Continue IV Zosyn. -continue IVF's -follow cx's -retention relieved by foley placement -continue rapaflo   #3 leukocytosis -Unknown etiology. -cx's pending -significant improvement with abx's and IVF's -imaging UTI and demargination from recent instrumentation -will continue tx with zosyn for now -CBC in am   #4 sinus tachycardia -improved/resolved -will monitor telemetry on 12/19 if remains stable.  #5 constipation -patient with multiple BM after enema use -Will continue bowel regimen and advise to increase fiber ingestion and good hydration  #6 history of gout -Stable. Continue allopurinol.  #7 left proximal obstructing stone status post cystoscopy, left urethral stent placement, left retrograde pyelography with interpretation -Patient had these procedures done on 05/07/2014.  -plan is for left ureteroscopy and litho on 12/19 -pain is well controlled   #8 essential HTN -Continue lisinopril -BP stable  #9 prophylaxis PPI for GI prophylaxis. Lovenox for DVT prophylaxis (last would be on hold night before procedure)  Code Status: Full Family Communication: wife at bedside Disposition Plan: home when stable (if no surprises or complications probably home on 12/19 after procedure)   Consultants:  Urology (Dr.  Mena Goes)  Procedures:  Planned left ureteroscopy and litho on 12/19  Antibiotics:  Zosyn 12/17  HPI/Subjective: Afebrile, feeling better, no CP or SOB. Patient reports no dysuria or hematuria and endorses multiple BM overnight.  Objective: Filed Vitals:   05/13/14 2106  BP: 123/53  Pulse: 65  Temp: 98.2 F (36.8 C)  Resp: 18    Intake/Output Summary (Last 24 hours) at 05/13/14 2113 Last data filed at 05/13/14 2110  Gross per 24 hour  Intake 2592.5 ml  Output   1500 ml  Net 1092.5 ml   Filed Weights   05/12/14 1533 05/13/14 0630  Weight: 90.447 kg (199 lb 6.4 oz) 90.901 kg (200 lb 6.4 oz)    Exam:   General:  Afebrile, no CP, feeling better. Report multiple BM overnight. No dysuria or hematuria  Cardiovascular: regular rate, no rubs or gallops, S1 and S2  Respiratory: CTA bilaterally  Abdomen: no tenderness, no guarding, positive BS  Musculoskeletal: LLE swelling (chronic and associated with venous insufficiency), no erythema, no pain and no warm sensation on exam  Data Reviewed: Basic Metabolic Panel:  Recent Labs Lab 05/07/14 1035 05/08/14 0502 05/12/14 0807 05/12/14 1225 05/13/14 0441  NA 138 141 143  --  144  K 4.4 4.4 3.9  --  3.6*  CL 101 106 101  --  108  CO2 24 26 26   --  25  GLUCOSE 180* 122* 201*  --  132*  BUN 21 17 18   --  16  CREATININE 1.48* 1.24 0.99  --  0.94  CALCIUM 9.1 8.6 9.4  --  8.4  MG  --   --   --  2.0  --    Liver Function Tests:  Recent Labs Lab 05/07/14 1035 05/12/14 1225 05/13/14 0441  AST 25 37 59*  ALT 26 29 28  ALKPHOS 81 88 66  BILITOT 0.4 0.5 1.1  PROT 6.5 6.1 5.1*  ALBUMIN 3.4* 3.1* 2.5*    Recent Labs Lab 05/07/14 1035  LIPASE 20   CBC:  Recent Labs Lab 05/07/14 1035 05/08/14 0502 05/12/14 0807 05/13/14 0441  WBC 10.3 7.4 20.9* 11.1*  NEUTROABS 8.6*  --  19.1*  --   HGB 16.3 13.5 16.7 13.5  HCT 47.7 41.3 50.1 40.6  MCV 94.1 95.6 95.2 94.9  PLT 176 144* 206 159   CBG:  Recent  Labs Lab 05/13/14 0807  GLUCAP 117*    Recent Results (from the past 240 hour(s))  Urine culture     Status: None   Collection Time: 05/12/14  7:18 AM  Result Value Ref Range Status   Specimen Description URINE, CATHETERIZED  Final   Special Requests NONE  Final   Culture  Setup Time   Final    05/12/2014 11:31 Performed at [facility]    Colony Count NO GROWTH Performed at [facility]   Final   Culture NO GROWTH Performed at [facility]   Final   Report Status 05/13/2014 FINAL  Final  Clostridium Difficile by PCR     Status: None   Collection Time: 05/12/14  8:34 AM  Result Value Ref Range Status   C difficile by pcr NEGATIVE NEGATIVE Final    Comment: Performed at [facility]  Culture, blood (routine x 2)     Status: None (Preliminary result)   Collection Time: 05/12/14 12:24 PM  Result Value Ref Range Status   Specimen Description BLOOD RIGHT ANTECUBITAL  Final   Special Requests BOTTLES DRAWN AEROBIC AND ANAEROBIC  Final   Culture  Setup Time   Final    05/12/2014 14:51 Performed at [facility]    Culture   Final           BLOOD CULTURE RECEIVED NO GROWTH TO DATE CULTURE WILL BE HELD FOR 5 DAYS BEFORE ISSUING A FINAL NEGATIVE REPORT Performed at [facility]    Report Status PENDING  Incomplete  Culture, blood (routine x 2)     Status: None (Preliminary result)   Collection Time: 05/12/14 12:34 PM  Result Value Ref Range Status   Specimen Description BLOOD RIGHT HAND  Final   Special Requests BOTTLES DRAWN AEROBIC AND ANAEROBIC  Final   Culture  Setup Time   Final    05/12/2014 14:51 Performed at [facility]    Culture   Final           BLOOD CULTURE RECEIVED NO GROWTH TO DATE CULTURE WILL BE HELD FOR 5 DAYS BEFORE ISSUING A FINAL NEGATIVE REPORT Performed at [facility]    Report Status PENDING  Incomplete     Studies: Ct Abdomen Pelvis Wo Contrast  05/12/2014   CLINICAL  DATA:  Pain and urinary retention  EXAM: CT ABDOMEN AND PELVIS WITHOUT CONTRAST  TECHNIQUE: Multidetector CT imaging of the abdomen and pelvis was performed following the standard protocol without oral or intravenous contrast material administration.  COMPARISON:  May 05, 2014  FINDINGS: There is mild bibasilar lung scarring. Lung bases are otherwise clear.  Liver is prominent, measuring 18.2 cm in length. No focal liver lesions are identified on this noncontrast enhanced study. There is cholelithiasis. The gallbladder wall is not thickened. There is no biliary duct dilatation.  Spleen and adrenals appear normal. There is fatty infiltration in the pancreas. No focal  pancreatic mass or inflammatory focus is identified.  There is now a double-J stent on the left extending from the upper pole left renal collecting system to the bladder. There is a 4 mm calculus in the periphery of the left mid kidney. In the lower pole left kidney region, there is a calculus measuring 1.0 by 0.8 cm with adjacent calculus measuring 0.9 x 0.6 cm. There is no hydronephrosis or mass on the left. There is no appreciable perinephric thickening on the left. No ureteral calculus on the left is seen. Note that the stent could obscure a small calculus. On the right, there is a 4 x 4 mm calculus in the upper to mid portion peripherally. There is a 4 x 3 mm calculus in the anterior mid right kidney as well as a mid nearby 2 x 2 mm calculus in the mid kidney. There is no hydronephrosis or mass in the right. There is no right-sided ureteral calculus.  In the pelvis, urinary bladder is decompressed with a Foley catheter. The distal aspect of the double-J stent is in the bladder posteriorly on the left. There are multiple prostatic calculi present. There are multiple sigmoid diverticula without diverticulitis. The rectum is currently distended with stool. There is perirectal fat stranding, possibly due to either a degree of proctitis or secondary  to distention of the rectum from the stool. There is no pelvic mass or fluid collection.  Appendix region appears normal.  There is no bowel obstruction. No free air or portal venous air. There is no appreciable ascites, adenopathy, or abscess in the abdomen or pelvis. There is atherosclerotic change in the aorta but no aneurysm. There is degenerative change in the lumbar spine. There are no blastic or lytic bone lesions.  IMPRESSION: Double-J stent is noted on the left extending from the upper pole left renal collecting system to the urinary bladder. Calculi are noted within the left kidney. A previously noted calculus at the left ureteropelvic junction is now seen with in the lower pole left renal collecting system. No ureteral calculi are identified on either side currently. There are intrarenal calculi bilaterally, larger on the left than on the right. There is no longer perinephric stranding on the left compared to recent prior study.  There is cholelithiasis. Liver is prominent without focal liver lesion on this noncontrast enhanced study.  There is distention of the rectum with stool. There is thickening of the wall of the rectum with adjacent stranding in the perirectal fat. This finding may be due to reactive change from the rectal distension from stool but also could represent early proctitis.  No bowel obstruction.  No abscess.  Multiple prostatic calculi.   Electronically Signed   By: Bretta BangWilliam  Woodruff M.D.   On: 05/12/2014 08:29   Dg Chest 2 View  05/12/2014   CLINICAL DATA:  Hypertension and hypoxia  EXAM: CHEST  2 VIEW  COMPARISON:  None.  FINDINGS: There is no edema or consolidation. The heart size and pulmonary vascularity are normal. No adenopathy. There is atherosclerotic change in the aortic arch region. There is degenerative change in the thoracic spine.  IMPRESSION: No edema or consolidation.   Electronically Signed   By: Bretta BangWilliam  Woodruff M.D.   On: 05/12/2014 08:51   Nm Pulmonary Perf  And Vent  05/12/2014   CLINICAL DATA:  Hypoxia  EXAM: NUCLEAR MEDICINE VENTILATION - PERFUSION LUNG SCAN  Views: Anterior, posterior, left lateral, right lateral, RPO, LPO, RAO, LAO -ventilation and perfusion  Radionuclide: Technetium  3420m DTPA -ventilation; Technetium 5220m macroaggregated albumin -perfusion  Dose:  46.0 mCi-ventilation; 5.5 mCi- perfusion  Route of administration: Inhalation-ventilation; intravenous -perfusion  COMPARISON:  Chest radiograph May 12, 2014  FINDINGS: Ventilation: Radiotracer uptake is homogeneous and symmetric bilaterally. No ventilation defects are appreciable.  Perfusion: Radiotracer uptake is homogeneous and symmetric bilaterally. No perfusion defects are appreciable.  IMPRESSION: No appreciable ventilation or perfusion defects. Very low probability of pulmonary embolus.   Electronically Signed   By: Bretta BangWilliam  Woodruff M.D.   On: 05/12/2014 10:24    Scheduled Meds: . allopurinol  300 mg Oral Daily  . aspirin  325 mg Oral Daily  . beta carotene w/minerals  1 tablet Oral Daily  . docusate sodium  100 mg Oral BID  . enoxaparin (LOVENOX) injection  40 mg Subcutaneous Q24H  . lisinopril  10 mg Oral Daily  . multivitamin with minerals  1 tablet Oral Daily  . pantoprazole  40 mg Oral Q0600  . piperacillin-tazobactam (ZOSYN)  IV  3.375 g Intravenous 3 times per day  . prednisoLONE acetate  1 drop Both Eyes QHS  . silodosin  8 mg Oral Q breakfast  . sodium chloride  3 mL Intravenous Q12H   Continuous Infusions: . sodium chloride 75 mL/hr at 05/13/14 0201    Principal Problem:   SIRS (systemic inflammatory response syndrome) Active Problems:   Nephrolithiasis   Urinary tract infection   Leukocytosis   Tachycardia   Constipation   Hemorrhoids   Gout   Urinary tract infectious disease    Time spent: 30 minutes    Vassie LollMadera, Mickle Campton  [facility] Pager [phone]. If 7PM-7AM, please contact night-coverage at [email or web address], password [email or web address] 05/13/2014,  9:13 PM  LOS: 1 day

## 2014-05-14 ENCOUNTER — Inpatient Hospital Stay (HOSPITAL_COMMUNITY): Payer: Medicare Other | Admitting: Anesthesiology

## 2014-05-14 ENCOUNTER — Encounter (HOSPITAL_COMMUNITY)
Admission: EM | Disposition: A | Discharge status: Home / Self Care | Payer: Self-pay | Source: Home / Self Care | Attending: Internal Medicine

## 2014-05-14 ENCOUNTER — Encounter (HOSPITAL_COMMUNITY): Payer: Self-pay | Admitting: *Deleted

## 2014-05-14 DIAGNOSIS — K6289 Other specified diseases of anus and rectum: Secondary | ICD-10-CM

## 2014-05-14 DIAGNOSIS — K5909 Other constipation: Secondary | ICD-10-CM

## 2014-05-14 DIAGNOSIS — N200 Calculus of kidney: Secondary | ICD-10-CM

## 2014-05-14 DIAGNOSIS — A419 Sepsis, unspecified organism: Principal | ICD-10-CM

## 2014-05-14 HISTORY — PX: HOLMIUM LASER APPLICATION: SHX5852

## 2014-05-14 HISTORY — PX: CYSTOSCOPY WITH RETROGRADE PYELOGRAM, URETEROSCOPY AND STENT PLACEMENT: SHX5789

## 2014-05-14 LAB — BASIC METABOLIC PANEL
Anion gap: 9 (ref 5–15)
BUN: 14 mg/dL (ref 6–23)
CALCIUM: 8.3 mg/dL — AB (ref 8.4–10.5)
CO2: 25 meq/L (ref 19–32)
Chloride: 109 mEq/L (ref 96–112)
Creatinine, Ser: 0.97 mg/dL (ref 0.50–1.35)
GFR calc Af Amer: 90 mL/min — ABNORMAL LOW (ref >90)
GFR, EST NON AFRICAN AMERICAN: 78 mL/min — AB (ref >90)
GLUCOSE: 116 mg/dL — AB (ref 70–99)
POTASSIUM: 3.8 meq/L (ref 3.7–5.3)
Sodium: 143 mEq/L (ref 137–147)

## 2014-05-14 LAB — CBC
HEMATOCRIT: 42.4 % (ref 39.0–52.0)
HEMOGLOBIN: 13.9 g/dL (ref 13.0–17.0)
MCH: 31.5 pg (ref 26.0–34.0)
MCHC: 32.8 g/dL (ref 30.0–36.0)
MCV: 96.1 fL (ref 78.0–100.0)
Platelets: 169 10*3/uL (ref 150–400)
RBC: 4.41 MIL/uL (ref 4.22–5.81)
RDW: 13.5 % (ref 11.5–15.5)
WBC: 7.5 10*3/uL (ref 4.0–10.5)

## 2014-05-14 LAB — GLUCOSE, CAPILLARY: Glucose-Capillary: 112 mg/dL — ABNORMAL HIGH (ref 70–99)

## 2014-05-14 LAB — SURGICAL PCR SCREEN
MRSA, PCR: NEGATIVE
Staphylococcus aureus: NEGATIVE

## 2014-05-14 LAB — PROCALCITONIN: Procalcitonin: 0.8 ng/mL

## 2014-05-14 SURGERY — CYSTOURETEROSCOPY, WITH RETROGRADE PYELOGRAM AND STENT INSERTION
Anesthesia: General | Laterality: Left

## 2014-05-14 MED ORDER — LIDOCAINE HCL 2 % EX GEL
CUTANEOUS | Status: AC
  Filled 2014-05-14: qty 10

## 2014-05-14 MED ORDER — FENTANYL CITRATE 0.05 MG/ML IJ SOLN
25.0000 ug | INTRAMUSCULAR | Status: DC | PRN
  Administered 2014-05-14 (×2): 50 ug via INTRAVENOUS

## 2014-05-14 MED ORDER — FENTANYL CITRATE 0.05 MG/ML IJ SOLN
INTRAMUSCULAR | Status: AC
  Filled 2014-05-14: qty 2

## 2014-05-14 MED ORDER — PROPOFOL 10 MG/ML IV BOLUS
INTRAVENOUS | Status: DC | PRN
  Administered 2014-05-14: 150 mg via INTRAVENOUS

## 2014-05-14 MED ORDER — ONDANSETRON HCL 4 MG/2ML IJ SOLN
INTRAMUSCULAR | Status: DC | PRN
  Administered 2014-05-14: 4 mg via INTRAVENOUS

## 2014-05-14 MED ORDER — DSS 100 MG PO CAPS: 100.0000 mg | ORAL_CAPSULE | Freq: Two times a day (BID) | ORAL | Status: DC

## 2014-05-14 MED ORDER — OXYCODONE-ACETAMINOPHEN 5-325 MG PO TABS: 1.0000 | ORAL_TABLET | Freq: Four times a day (QID) | ORAL | Status: DC | PRN

## 2014-05-14 MED ORDER — LIDOCAINE HCL (CARDIAC) 20 MG/ML IV SOLN
INTRAVENOUS | Status: DC | PRN
  Administered 2014-05-14: 100 mg via INTRAVENOUS

## 2014-05-14 MED ORDER — FENTANYL CITRATE 0.05 MG/ML IJ SOLN
INTRAMUSCULAR | Status: DC | PRN
  Administered 2014-05-14 (×4): 50 ug via INTRAVENOUS

## 2014-05-14 MED ORDER — CIPROFLOXACIN HCL 500 MG PO TABS: 500.0000 mg | ORAL_TABLET | Freq: Two times a day (BID) | ORAL | Status: DC

## 2014-05-14 MED ORDER — EPHEDRINE SULFATE 50 MG/ML IJ SOLN
INTRAMUSCULAR | Status: DC | PRN
  Administered 2014-05-14 (×2): 5 mg via INTRAVENOUS
  Administered 2014-05-14: 10 mg via INTRAVENOUS

## 2014-05-14 MED ORDER — LIDOCAINE HCL 2 % EX GEL
CUTANEOUS | Status: DC | PRN
  Administered 2014-05-14: 1 via URETHRAL

## 2014-05-14 MED ORDER — SODIUM CHLORIDE 0.9 % IR SOLN
Status: DC | PRN
  Administered 2014-05-14: 3000 mL

## 2014-05-14 MED ORDER — CEPHALEXIN 500 MG PO CAPS: 500.0000 mg | ORAL_CAPSULE | Freq: Two times a day (BID) | ORAL | Status: DC

## 2014-05-14 MED ORDER — PROPOFOL 10 MG/ML IV BOLUS
INTRAVENOUS | Status: AC
  Filled 2014-05-14: qty 20

## 2014-05-14 MED ORDER — METRONIDAZOLE 500 MG PO TABS
500.0000 mg | ORAL_TABLET | Freq: Three times a day (TID) | ORAL | Status: DC
Start: 2014-05-14 — End: 2018-10-18

## 2014-05-14 MED ORDER — ONDANSETRON HCL 4 MG/2ML IJ SOLN: 4.0000 mg | Freq: Once | INTRAMUSCULAR | Status: DC | PRN

## 2014-05-14 MED ORDER — BISACODYL 5 MG PO TBEC: 5.0000 mg | DELAYED_RELEASE_TABLET | Freq: Every day | ORAL | Status: AC

## 2014-05-14 MED ORDER — LIP MEDEX EX OINT
TOPICAL_OINTMENT | CUTANEOUS | Status: AC
  Administered 2014-05-14: 16:00:00
  Filled 2014-05-14: qty 7

## 2014-05-14 MED ORDER — CEFAZOLIN SODIUM-DEXTROSE 2-3 GM-% IV SOLR: 2.0000 g | INTRAVENOUS | Status: DC

## 2014-05-14 SURGICAL SUPPLY — 33 items
BAG URO CATCHER STRL LF (DRAPE) ×3 IMPLANT
BASKET LASER NITINOL 1.9FR (BASKET) IMPLANT
BASKET STNLS GEMINI 4WIRE 3FR (BASKET) IMPLANT
BASKET ZERO TIP NITINOL 2.4FR (BASKET) IMPLANT
BRUSH URET BIOPSY 3F (UROLOGICAL SUPPLIES) IMPLANT
BSKT STON RTRVL 120 1.9FR (BASKET)
BSKT STON RTRVL GEM 120X11 3FR (BASKET)
BSKT STON RTRVL ZERO TP 2.4FR (BASKET)
CATH INTERMIT  6FR 70CM (CATHETERS) ×2 IMPLANT
CATH URET DUAL LUMEN 6-10FR 50 (CATHETERS) ×2 IMPLANT
CLOTH BEACON ORANGE TIMEOUT ST (SAFETY) ×1 IMPLANT
DRAPE CAMERA CLOSED 9X96 (DRAPES) ×2 IMPLANT
EXTRACTOR STONE NITINOL NGAGE (UROLOGICAL SUPPLIES) ×2 IMPLANT
FIBER LASER FLEXIVA 1000 (UROLOGICAL SUPPLIES) ×1 IMPLANT
FIBER LASER FLEXIVA 200 (UROLOGICAL SUPPLIES) ×3 IMPLANT
FIBER LASER FLEXIVA 365 (UROLOGICAL SUPPLIES) ×1 IMPLANT
FIBER LASER FLEXIVA 550 (UROLOGICAL SUPPLIES) ×1 IMPLANT
FIBER LASER TRAC TIP (UROLOGICAL SUPPLIES) ×1 IMPLANT
GLOVE BIOGEL M STRL SZ7.5 (GLOVE) ×5 IMPLANT
GOWN STRL REUS W/TWL XL LVL3 (GOWN DISPOSABLE) ×3 IMPLANT
GUIDEWIRE ANG ZIPWIRE 038X150 (WIRE) IMPLANT
GUIDEWIRE STR DUAL SENSOR (WIRE) ×4 IMPLANT
IV NS IRRIG 3000ML ARTHROMATIC (IV SOLUTION) ×6 IMPLANT
KIT BALLIN UROMAX 15FX10 (LABEL) IMPLANT
KIT BALLN UROMAX 15FX4 (MISCELLANEOUS) IMPLANT
KIT BALLN UROMAX 26 75X4 (MISCELLANEOUS)
PACK CYSTO (CUSTOM PROCEDURE TRAY) ×4 IMPLANT
SET HIGH PRES BAL DIL (LABEL)
SHEATH ACCESS URETERAL 24CM (SHEATH) IMPLANT
SHEATH ACCESS URETERAL 38CM (SHEATH) ×2 IMPLANT
SHEATH ACCESS URETERAL 54CM (SHEATH) IMPLANT
STENT SFT UR W/WIRE 6FRX26CM (STENTS) ×2 IMPLANT
SYRINGE IRR TOOMEY STRL 70CC (SYRINGE) IMPLANT

## 2014-05-14 NOTE — Progress Notes (Signed)
Appreciate hospitalist care. Patient s/p Left ureteroscopy, laser lithotripsy and left ureteral stent. He may be d/c'd to home from a GU pt of view.   I wrote for 5 days of cephalexin 500 mg BID and some pain meds. Rx on chart, instructions and f/u on chart. Thanks.

## 2014-05-14 NOTE — Progress Notes (Addendum)
Pt without complaint this AM.   Filed Vitals:   05/14/14 0814  BP: 140/57  Pulse: 65  Temp: 98.1 F (36.7 C)  Resp: 20    PE: NAD CV - RRR Resp - normal rate, unlabored Ext - no CCE GU - urine clear  CBC, bmp, PCT, blood and urine Cx reviewed  CT images reviewed  A/P - left nephrolithiasis - I discussed with the patient the nature, potential benefits, risks and alternatives to left URS/HLL/stent, including side effects of the proposed treatment, the likelihood of the patient achieving the goals of the procedure, and any potential problems that might occur during the procedure or recuperation. All questions answered. Patient elects to proceed. I kept his Jan 12 OR date planning for a staged procedure - discussed with patient.   I should add there is no UTI. Urine Cx negative x 2 (office and hospital).

## 2014-05-14 NOTE — Progress Notes (Signed)
Patient's family expressed concern that patient has been on aspirin and also received Lovenox dose yesterday.  Will attempt to communicate these concerns to MD in the am prior to procedure.  Patient's heart rate has also been going down to the mid 40's overnight.  Patient denies any chest pain or SOB and heart rate comes back up once patient is awoken.  Will continue to monitor patient.

## 2014-05-14 NOTE — Op Note (Signed)
Preoperative diagnosis: left nephrolithiasis Postoperative diagnosis: Left nephrolithiasis  Procedure: Cystoscopy with left ureteroscopy, holmium laser lithotripsy, stent exchange  Surgeon: Mena GoesEskridge  Anesthesia: Judd  Type of anesthesia: Gen.  Indication for procedure: Mr. Gordon Novak is a 79 year old male with significant left nephrolithiasis. He has ureteral stent in place. He was admitted with constipation and because he was in the hospital and having some stent discomfort I went ahead and proceeded with definitive stone management today rather than waiting a couple weeks. Given the large nature of the lower pole stones we did discussed a staged approach.  Findings: On exam under anesthesia the penis was normal without mass or lesion. The testicles were descended bilaterally and palpably normal. On digital rectal exam the prostate was small and benign. No hard areas or nodules. Landmarks were preserved.  On cystoscopy the bladder revealed trabeculation but was otherwise normal. On left ureteroscopy there were 2 large lower pole stones. The prior large ureteral stone at drop down into the lower pole. In a 90 angle there was another infundibulum and the lower pole the contain the more inferior large stone and this could only be grazed with the ureteroscope. This lower pole stone would need PCNL to clear or shockwave lithotripsy with possible repeat ureteroscopy.  Description of procedure: After consent was obtained patient brought to the operating room. After adequate anesthesia his placed in lithotomy position and prepped and draped in the usual sterile fashion. A timeout was performed to confirm the patient and procedure. An exam under anesthesia was performed. The cystoscope was passed per urethra and the left ureteral stent grasped and removed through the urethral meatus. Advanced a sensor wire up into the collecting system and remove the stent. Over the sensor wire a dual-lumen exchange catheter  was passed and this went very easily. A second wire was placed. A ureteral access sheath was placed. The digital ureteroscope was advanced and I tried to grasp the large stone and bring it into the upper pole but it was too big. Therefore at a setting of 0.4 and 20 a lot of the stone was dusted but the other broke up nicely and the manageable pieces. The stone was quite hard. Using an in gauge basket the pieces were sequentially removed. There was one large piece that would not fit through the UPJ therefore I did drop in the upper pole, broke it up and then removed the pieces. There were no other significant fragments of this first stone.  Going back into the lower pole the tip of the other stone was visualized. This infundibulum came off the lower pole but about a 90 angle. Therefore I did deploy the laser but was only able to grazed off the tip. I was concerned about getting the large stone engaged in a basket that #1 it may not pull through the infundibular opening and up out of the lower pole and #2 if I engaged in the basket I may not be able to release it. I could no longer utilize the ureteroscope and laser to fragment and therefore if it got stuck in the basket the basket may be irretrievable without more difficulty.  Therefore I decided to place a stent. If the patient elects to continue observation this incomplete removed. Another approach might be to perform shockwave lithotripsy and remove the stent at a later date or performed ureteroscopy. The lower pole stone had been stable since by 2011.  The collecting system again was visualized and noted no other significant stone fragments apart  from the large lower pole stone that I was leaving. The UPJ was normal. Proximal ureter normal. I backed the ureteroscope to the access sheath and removed the 2 together. The ureter was normal without injury or stone. I backloaded the wire on the cystoscope and placed a 6 x 26 cm left ureteral stent. A good coil  reconstituted in the collecting system and a good coil in the bladder. Bladder was drained and the scope removed. Lidocaine jelly was instilled per urethra. The patient was awakened and taken to the recovery room in stable condition.  Complications: None Blood loss: Minimal  Specimens: Stone fragments given the family  Drains: 6 x 26 cm left ureteral stent

## 2014-05-14 NOTE — Discharge Summary (Addendum)
Physician Discharge Summary  Gordon Novak:[id number] DOB: 04/23/1935 DOA: 05/12/2014  PCP: Pamelia HoitWILSON,FRED HENRY, MD  Admit date: 05/12/2014 Discharge date: 05/14/2014  Recommendations for Outpatient Follow-up:  1. Pt will need to follow up with PCP in 2 weeks post discharge 2. Please obtain BMP in one week 3. Follow up with Dr. Mena GoesEskridge in 2-3 weeks  Discharge Diagnoses:  Sepsis  -present at time of admission, due to proctitits -Due to infectious process without sepsis -Patient presented with WBC 20.9 with tachycardia -Related to the patient's constipation and proctitis -Blood cultures negative to date -Urine cultures negative -WBC improved--7.5 on day of d/c discharge -CXR--neg -Cdiff PCR--negative -Procalcitonin 1.41-->0.80 -Patient will go home with ciprofloxacin and Flagyl for 7 more days which will complete 10 days' therapy  Proctitis  -Secondary to constipation  -Antibiotics as discussed   Pyuria -As expected given the patient's nephrolithiasis and recent stent -Urine cultures negative  Urinary retention -Patient had an indwelling Foley catheter -Foley discontinued postop  Nephrolithiasis -05/14/2014--Cystoscopy with left ureteroscopy, holmium laser lithotripsy, L-ureteral stent exchange  sinus tachycardia -improved/resolved -will monitor telemetry on 12/19 if remains stable.  constipation -patient with multiple BM after enema use -Will continue bowel regimen and advise to increase fiber ingestion and good hydration  Discharge Condition: stable  Disposition:  Follow-up Information    [facility]   Specialty:  Urology   Contact information:   [address] [place] [address] [phone]       Diet:regular Wt Readings from Last 3 Encounters:  05/14/14 92.1 kg (203 lb 0.7 oz)  05/07/14 87.998 kg (194 lb)    History of present illness:  79 year old male with a history of nephrolithiasis, left eye blindness,  status post cystoscopy with ureteral stent placement on the left 05/07/2014 presented to the ED with a one-day history of inability to urinate which started last night the night prior to admission. Patient stated that he was unable to sleep secondary to this. Patient also with complaints of lower abdominal pain.in and nonradiating. Patient denies any fevers, no chills, no nausea, no vomiting, no chest pain, no shortness of breath, no cough, no hematemesis, no melena, no hematochezia, no weakness. Patient does endorse a bout of constipation and states he took a laxative for this and had multiple loose stools. Patient presented to the emergency room weight was noted to be tachycardic with a CBC with a white count of 20.9. Basic metabolic profile obtained was unremarkable. Lactic acid level was 1.56. Chest x-ray done was negative. CT of the abdomen and pelvis done showed double-J stent on the left extending from the upper pole left renal collecting system to the urinary bladder. Calculi noted within the left kidney. Intrarenal calculi bilaterally left greater than right. No longer perinephric stranding on the left compared to recent study. Cholelithiasis. Distention of the rectum with stool and a thickening of the rectal wall with adjacent stranding in the perirectal fat. VQ scan was performed and was very low probability for PE. 05/11/2014 urine culture in the office was negative. The patient was started on IV fluids and intravenous Zosyn empirically. Blood cultures and urine cultures remained negative. The patient's Zosyn was discontinued. The patient was given cathartics with multiple bowel movements. C. difficile PCR was negative.  Consultants: Urology--Dr. Mena GoesEskridge Discharge Exam: Filed Vitals:   05/14/14 1406  BP: 165/84  Pulse: 72  Temp: 97.8 F (36.6 C)  Resp: 20   Filed Vitals:   05/14/14 1335 05/14/14 1340 05/14/14  1345 05/14/14 1406  BP:   140/69 165/84  Pulse: 79 72 71 72  Temp:   97.6  F (36.4 C) 97.8 F (36.6 C)  TempSrc:      Resp: 23 15 19 20   Height:      Weight:      SpO2: 98% 98% 97% 99%   General: A&O x 3, NAD, pleasant, cooperative Cardiovascular: RRR, no rub, no gallop, no S3 Respiratory: CTAB, no wheeze, no rhonchi Abdomen:soft, nontender, nondistended, positive bowel sounds   Discharge Instructions      Discharge Instructions    Diet - low sodium heart healthy    Complete by:  As directed      Increase activity slowly    Complete by:  As directed             Medication List    TAKE these medications        allopurinol 300 MG tablet  Commonly known as:  ZYLOPRIM  Take 300 mg by mouth daily.     aspirin 325 MG tablet  Take 325 mg by mouth daily.     beta carotene w/minerals tablet  Take 1 tablet by mouth daily.     bisacodyl 5 MG EC tablet  Commonly known as:  DULCOLAX  Take 1 tablet (5 mg total) by mouth daily.     cephALEXin 500 MG capsule  Commonly known as:  KEFLEX  Take 1 capsule (500 mg total) by mouth 2 (two) times daily.     ciprofloxacin 500 MG tablet  Commonly known as:  CIPRO  Take 1 tablet (500 mg total) by mouth 2 (two) times daily.     DSS 100 MG Caps  Take 100 mg by mouth 2 (two) times daily.     furosemide 20 MG tablet  Commonly known as:  LASIX  Take 20 mg by mouth daily.     lisinopril 10 MG tablet  Commonly known as:  PRINIVIL,ZESTRIL  Take 10 mg by mouth daily.     metroNIDAZOLE 500 MG tablet  Commonly known as:  FLAGYL  Take 1 tablet (500 mg total) by mouth 3 (three) times daily.     mirabegron ER 50 MG Tb24 tablet  Commonly known as:  MYRBETRIQ  Take 1 tablet (50 mg total) by mouth daily.     multivitamin with minerals Tabs tablet  Take 1 tablet by mouth daily.     oxyCODONE-acetaminophen 5-325 MG per tablet  Commonly known as:  PERCOCET/ROXICET  Take 2 tablets by mouth every 6 (six) hours as needed for severe pain.     oxyCODONE-acetaminophen 5-325 MG per tablet  Commonly known as:   ROXICET  Take 1-2 tablets by mouth every 6 (six) hours as needed for severe pain.     RAPAFLO 8 MG Caps capsule  Generic drug:  silodosin  Take 8 mg by mouth daily with breakfast.     tamsulosin 0.4 MG Caps capsule  Commonly known as:  FLOMAX  Take 2 capsules (0.8 mg total) by mouth daily.     valACYclovir 500 MG tablet  Commonly known as:  VALTREX  Take 500 mg by mouth 2 (two) times daily as needed (cold sores).         The results of significant diagnostics from this hospitalization (including imaging, microbiology, ancillary and laboratory) are listed below for reference.    Significant Diagnostic Studies: Ct Abdomen Pelvis Wo Contrast  05/12/2014   CLINICAL DATA:  Pain and urinary retention  EXAM: CT ABDOMEN AND PELVIS WITHOUT CONTRAST  TECHNIQUE: Multidetector CT imaging of the abdomen and pelvis was performed following the standard protocol without oral or intravenous contrast material administration.  COMPARISON:  May 05, 2014  FINDINGS: There is mild bibasilar lung scarring. Lung bases are otherwise clear.  Liver is prominent, measuring 18.2 cm in length. No focal liver lesions are identified on this noncontrast enhanced study. There is cholelithiasis. The gallbladder wall is not thickened. There is no biliary duct dilatation.  Spleen and adrenals appear normal. There is fatty infiltration in the pancreas. No focal pancreatic mass or inflammatory focus is identified.  There is now a double-J stent on the left extending from the upper pole left renal collecting system to the bladder. There is a 4 mm calculus in the periphery of the left mid kidney. In the lower pole left kidney region, there is a calculus measuring 1.0 by 0.8 cm with adjacent calculus measuring 0.9 x 0.6 cm. There is no hydronephrosis or mass on the left. There is no appreciable perinephric thickening on the left. No ureteral calculus on the left is seen. Note that the stent could obscure a small calculus. On the  right, there is a 4 x 4 mm calculus in the upper to mid portion peripherally. There is a 4 x 3 mm calculus in the anterior mid right kidney as well as a mid nearby 2 x 2 mm calculus in the mid kidney. There is no hydronephrosis or mass in the right. There is no right-sided ureteral calculus.  In the pelvis, urinary bladder is decompressed with a Foley catheter. The distal aspect of the double-J stent is in the bladder posteriorly on the left. There are multiple prostatic calculi present. There are multiple sigmoid diverticula without diverticulitis. The rectum is currently distended with stool. There is perirectal fat stranding, possibly due to either a degree of proctitis or secondary to distention of the rectum from the stool. There is no pelvic mass or fluid collection.  Appendix region appears normal.  There is no bowel obstruction. No free air or portal venous air. There is no appreciable ascites, adenopathy, or abscess in the abdomen or pelvis. There is atherosclerotic change in the aorta but no aneurysm. There is degenerative change in the lumbar spine. There are no blastic or lytic bone lesions.  IMPRESSION: Double-J stent is noted on the left extending from the upper pole left renal collecting system to the urinary bladder. Calculi are noted within the left kidney. A previously noted calculus at the left ureteropelvic junction is now seen with in the lower pole left renal collecting system. No ureteral calculi are identified on either side currently. There are intrarenal calculi bilaterally, larger on the left than on the right. There is no longer perinephric stranding on the left compared to recent prior study.  There is cholelithiasis. Liver is prominent without focal liver lesion on this noncontrast enhanced study.  There is distention of the rectum with stool. There is thickening of the wall of the rectum with adjacent stranding in the perirectal fat. This finding may be due to reactive change from the  rectal distension from stool but also could represent early proctitis.  No bowel obstruction.  No abscess.  Multiple prostatic calculi.   Electronically Signed   By: Bretta Bang M.D.   On: 05/12/2014 08:29   Dg Chest 2 View  05/12/2014   CLINICAL DATA:  Hypertension and hypoxia  EXAM: CHEST  2 VIEW  COMPARISON:  None.  FINDINGS: There is no edema or consolidation. The heart size and pulmonary vascularity are normal. No adenopathy. There is atherosclerotic change in the aortic arch region. There is degenerative change in the thoracic spine.  IMPRESSION: No edema or consolidation.   Electronically Signed   By: Bretta Bang M.D.   On: 05/12/2014 08:51   Nm Pulmonary Perf And Vent  05/12/2014   CLINICAL DATA:  Hypoxia  EXAM: NUCLEAR MEDICINE VENTILATION - PERFUSION LUNG SCAN  Views: Anterior, posterior, left lateral, right lateral, RPO, LPO, RAO, LAO -ventilation and perfusion  Radionuclide: Technetium 59m DTPA -ventilation; Technetium 59m macroaggregated albumin -perfusion  Dose:  46.0 mCi-ventilation; 5.5 mCi- perfusion  Route of administration: Inhalation-ventilation; intravenous -perfusion  COMPARISON:  Chest radiograph May 12, 2014  FINDINGS: Ventilation: Radiotracer uptake is homogeneous and symmetric bilaterally. No ventilation defects are appreciable.  Perfusion: Radiotracer uptake is homogeneous and symmetric bilaterally. No perfusion defects are appreciable.  IMPRESSION: No appreciable ventilation or perfusion defects. Very low probability of pulmonary embolus.   Electronically Signed   By: Bretta Bang M.D.   On: 05/12/2014 10:24     Microbiology: Recent Results (from the past 240 hour(s))  Urine culture     Status: None   Collection Time: 05/12/14  7:18 AM  Result Value Ref Range Status   Specimen Description URINE, CATHETERIZED  Final   Special Requests NONE  Final   Culture  Setup Time   Final    05/12/2014 11:31 Performed at [facility]    Colony Count  NO GROWTH Performed at [facility]   Final   Culture NO GROWTH Performed at [facility]   Final   Report Status 05/13/2014 FINAL  Final  Clostridium Difficile by PCR     Status: None   Collection Time: 05/12/14  8:34 AM  Result Value Ref Range Status   C difficile by pcr NEGATIVE NEGATIVE Final    Comment: Performed at [facility]  Culture, blood (routine x 2)     Status: None (Preliminary result)   Collection Time: 05/12/14 12:24 PM  Result Value Ref Range Status   Specimen Description BLOOD RIGHT ANTECUBITAL  Final   Special Requests BOTTLES DRAWN AEROBIC AND ANAEROBIC  Final   Culture  Setup Time   Final    05/12/2014 14:51 Performed at [facility]    Culture   Final           BLOOD CULTURE RECEIVED NO GROWTH TO DATE CULTURE WILL BE HELD FOR 5 DAYS BEFORE ISSUING A FINAL NEGATIVE REPORT Performed at [facility]    Report Status PENDING  Incomplete  Culture, blood (routine x 2)     Status: None (Preliminary result)   Collection Time: 05/12/14 12:34 PM  Result Value Ref Range Status   Specimen Description BLOOD RIGHT HAND  Final   Special Requests BOTTLES DRAWN AEROBIC AND ANAEROBIC  Final   Culture  Setup Time   Final    05/12/2014 14:51 Performed at [facility]    Culture   Final           BLOOD CULTURE RECEIVED NO GROWTH TO DATE CULTURE WILL BE HELD FOR 5 DAYS BEFORE ISSUING A FINAL NEGATIVE REPORT Performed at [facility]    Report Status PENDING  Incomplete  Surgical pcr screen     Status: None   Collection Time: 05/14/14  8:24 AM  Result Value Ref Range Status   MRSA, PCR NEGATIVE  NEGATIVE Final   Staphylococcus aureus NEGATIVE NEGATIVE Final    Comment:        The Xpert SA Assay (FDA approved for NASAL specimens in patients over 77 years of age), is one component of a comprehensive surveillance program.  Test performance has been validated by [facility] for patients greater than or  equal to 77 year old. It is not intended to diagnose infection nor to guide or monitor treatment.      Labs: Basic Metabolic Panel:  Recent Labs Lab 05/08/14 0502 05/12/14 0807 05/12/14 1225 05/13/14 0441 05/14/14 0522  NA 141 143  --  144 143  K 4.4 3.9  --  3.6* 3.8  CL 106 101  --  108 109  CO2 26 26  --  25 25  GLUCOSE 122* 201*  --  132* 116*  BUN 17 18  --  16 14  CREATININE 1.24 0.99  --  0.94 0.97  CALCIUM 8.6 9.4  --  8.4 8.3*  MG  --   --  2.0  --   --    Liver Function Tests:  Recent Labs Lab 05/12/14 1225 05/13/14 0441  AST 37 59*  ALT 29 28  ALKPHOS 88 66  BILITOT 0.5 1.1  PROT 6.1 5.1*  ALBUMIN 3.1* 2.5*   No results for input(s): LIPASE, AMYLASE in the last 168 hours. No results for input(s): AMMONIA in the last 168 hours. CBC:  Recent Labs Lab 05/08/14 0502 05/12/14 0807 05/13/14 0441 05/14/14 0522  WBC 7.4 20.9* 11.1* 7.5  NEUTROABS  --  19.1*  --   --   HGB 13.5 16.7 13.5 13.9  HCT 41.3 50.1 40.6 42.4  MCV 95.6 95.2 94.9 96.1  PLT 144* 206 159 169   Cardiac Enzymes: No results for input(s): CKTOTAL, CKMB, CKMBINDEX, TROPONINI in the last 168 hours. BNP: Invalid input(s): POCBNP CBG:  Recent Labs Lab 05/13/14 0807 05/14/14 0740  GLUCAP 117* 112*    Time coordinating discharge:  Greater than 30 minutes  Signed:  Cassundra Mckeever, DO [facility] Pager: [phone] 05/14/2014, 4:34 PM

## 2014-05-14 NOTE — Transfer of Care (Signed)
Immediate Anesthesia Transfer of Care Note  Patient: Gordon Novak  Procedure(s) Performed: Procedure(s): LEFT URETEROSCOPY AND STENT EXCHANGE,STONE BASKETRY EXTRACTION (Left) HOLMIUM LASER APPLICATION (Left)  Patient Location: PACU  Anesthesia Type:General  Level of Consciousness: awake  Airway & Oxygen Therapy: Patient Spontanous Breathing and Patient connected to face mask oxygen  Post-op Assessment: Report given to PACU RN and Post -op Vital signs reviewed and stable  Post vital signs: Reviewed and stable  Complications: No apparent anesthesia complications

## 2014-05-14 NOTE — Anesthesia Postprocedure Evaluation (Signed)
  Anesthesia Post-op Note  Patient: Gordon Novak  Procedure(s) Performed: Procedure(s) (LRB): LEFT URETEROSCOPY AND STENT EXCHANGE,STONE BASKETRY EXTRACTION (Left) HOLMIUM LASER APPLICATION (Left)  Patient Location: PACU  Anesthesia Type: General  Level of Consciousness: awake and alert   Airway and Oxygen Therapy: Patient Spontanous Breathing  Post-op Pain: mild  Post-op Assessment: Post-op Vital signs reviewed, Patient's Cardiovascular Status Stable, Respiratory Function Stable, Patent Airway and No signs of Nausea or vomiting  Last Vitals:  Filed Vitals:   05/14/14 1320  BP:   Pulse: 69  Temp:   Resp: 13    Post-op Vital Signs: stable   Complications: No apparent anesthesia complications

## 2014-05-14 NOTE — Discharge Instructions (Signed)
Ureteral Stent Implantation, Care After Refer to this sheet in the next few weeks. These instructions provide you with information on caring for yourself after your procedure. Your health care provider may also give you more specific instructions. Your treatment has been planned according to current medical practices, but problems sometimes occur. Call your health care provider if you have any problems or questions after your procedure. WHAT TO EXPECT AFTER THE PROCEDURE You should be back to normal activity within 48 hours after the procedure. Nausea and vomiting may occur and are commonly the result of anesthesia. It is common to experience sharp pain in the back or lower abdomen and penis with voiding. This is caused by movement of the ends of the stent with the act of urinating.It usually goes away within minutes after you have stopped urinating. HOME CARE INSTRUCTIONS Make sure to drink plenty of fluids. You may have small amounts of bleeding, causing your urine to be red. This is normal. Certain movements may trigger pain or a feeling that you need to urinate. You may be given medicines to prevent infection or bladder spasms. Be sure to take all medicines as directed. Only take over-the-counter or prescription medicines for pain, discomfort, or fever as directed by your health care provider. Do not take aspirin, as this can make bleeding worse. Your stent will be left in until the blockage is resolved. This may take weeks or longer, depending on the reason for stent implantation. You may have an X-ray exam to make sure your ureter is open and that the stent has not moved out of position (migrated). The stent can be removed by your health care provider in the office. Be sure to keep all follow-up appointments so your health care provider can check that you are healing properly. SEEK MEDICAL CARE IF:  You experience increasing pain.  Your pain medicine is not working. SEEK IMMEDIATE MEDICAL CARE  IF:  Your urine is dark red or has blood clots.  You are leaking urine (incontinent).  You have a fever, chills, feeling sick to your stomach (nausea), or vomiting.  Your pain is not relieved by pain medicine.  The end of the stent comes out of the urethra.  You are unable to urinate.

## 2014-05-14 NOTE — Progress Notes (Signed)
Paged MD on call and spoke with him so that he could notify Dr Mena GoesEskridge this am about patient's heart rate and family's concern about aspirin and Lovenox.  Will continue to monitor patient.

## 2014-05-16 ENCOUNTER — Other Ambulatory Visit: Payer: Self-pay | Admitting: Urology

## 2014-05-16 ENCOUNTER — Encounter (HOSPITAL_COMMUNITY): Payer: Self-pay | Admitting: Urology

## 2014-05-18 LAB — CULTURE, BLOOD (ROUTINE X 2)
CULTURE: NO GROWTH
Culture: NO GROWTH

## 2014-05-24 ENCOUNTER — Encounter (HOSPITAL_COMMUNITY): Payer: Self-pay | Admitting: Urology

## 2014-05-30 ENCOUNTER — Encounter (HOSPITAL_COMMUNITY): Payer: Self-pay | Admitting: *Deleted

## 2014-06-01 NOTE — H&P (Signed)
History of Present Illness    F/u - PCP Dr. Benedetto Goad    1- BPH - Pt has been on Rapaflo 2-3 times per week. He started an alpha blocker in 2011.  -Jan 2013 Rapaflo 3 times a week on the average -Apr 2015 ran out of rapaflo. nl DRE. Rapaflo refilled.     2-nephrolithiasis - KUB 2011 showed small stones.  -Jan 2013 - KUB - four stable left renal stones.  -Apr 2015 - KUB - Stable right stones. Now only three left renal stones.    3-Impotence - His erectile dysfunction is without need for treatment.  -Jan 2013 no tx   4-MH -  -Apr 2015 tntc rbc's, Cystoscopy normal; Renal ultrasound - right kidney with stones of 7 mm, 6 mm and 6 mm respectively. The left kidney appeared normal with a 8 mm left upper pole, 6 mm left upper pole stone there was a 1 cm left lower pole stone. No mass or hydronephrosis. PVR 36 mL. The bladder wall was somewhat thick walled. No ureters were seen in the pelvis.       Dec 2015  Interval Hx:    Patient returns for continued evaluation and nephrolithiasis. He was seen earlier in the month with left flank pain with CT scan revealed a 10 mm left proximal stone and a 12 mm left lower pole stone. He returned over the weekend and underwent cystoscopy left ureteral stent placement due to refractory flank pain.  Today, he complains of constipation. He also has pain in the left flank. He voids. He has had no fever and he feels well. He has no dysuria but he has some mild gross hematuria. He does have chronic lower extremity edema on the left since 2007. He noticed it is worse this morning but has not been wearing a compression stocking like he normally does. No chest pain or shortness of breath.     Past Medical History Problems  1. History of Gout (M10.9) 2. History of diabetes mellitus (Z86.39) 3. History of kidney stones (Z61.096)  Surgical History Problems  1. History of Appendectomy 2. History of Cystoscopy With Insertion Of Ureteral Stent Right 3.  History of Eye Surgery 4. History of Lithotripsy 5. History of Tonsillectomy  Current Meds 1. Allopurinol 300 MG Oral Tablet;  Therapy: (Recorded:06Dec2007) to Recorded 2. Aspirin 325 MG Oral Tablet;  Therapy: (Recorded:06Dec2007) to Recorded 3. Centrum Silver TABS;  Therapy: (Recorded:06Dec2007) to Recorded 4. Furosemide TABS;  Therapy: (Recorded:23Dec2009) to Recorded 5. ICaps MV TABS;  Therapy: (Recorded:14Jan2013) to Recorded 6. Lisinopril 10 MG Oral Tablet;  Therapy: 07May2014 to Recorded 7. Myrbetriq 50 MG Oral Tablet Extended Release 24 Hour;  Therapy: (Recorded:16Dec2015) to Recorded 8. Oxycodone-Acetaminophen 5-325 MG Oral Tablet; TAKE 1 TO 2 TABLETS EVERY 4 TO 6  HOURS AS NEEDED;  Therapy: 10Dec2015 to (Evaluate:13Dec2015); Last Rx:10Dec2015 Ordered 9. PrednisoLONE Acetate 1 % Ophthalmic Suspension;  Therapy: 16Feb2015 to Recorded 10. Promethazine HCl - 12.5 MG Oral Tablet; TAKE 1 TABLET EVERY 6 HOURS AS NEEDED;   Therapy: 10Dec2015 to (Evaluate:15Dec2015)  Requested for: 10Dec2015; Last   Rx:10Dec2015 Ordered 11. Rapaflo 8 MG Oral Capsule; TAKE ONE CAPSULE BY MOUTH EVERY DAY WITH FOOD;   Therapy: 19May2011 to (Evaluate:09Apr2016)  Requested for: 15Apr2015; Last   Rx:15Apr2015 Ordered 12. ValACYclovir HCl - 500 MG Oral Tablet;   Therapy: 05Feb2015 to Recorded  Allergies Medication  1. Flomax CP24  Family History Problems  1. Family history of Death In The Family Father : Sister  heart attack 2. Family history of Death In The Family Mother : Sister   congestive heart failure 3. Family history of Diabetes Mellitus 4. Family history of Family Health Status Number Of Children   one daughter 5. Family history of Nephrolithiasis 6. Family history of Renal Failure  Social History Problems  1. Denied: History of Alcohol Use 2. Caffeine Use   2 per day 3. Marital History - Currently Married 4. Never a smoker 5. Occupation:   retired 6. Denied: History  of Tobacco Use  Vitals Vital Signs [Data Includes: Last 1 Day]  Recorded: 16Dec2015 08:28AM  Blood Pressure: 117 / 69 Temperature: 97.3 F Heart Rate: 94  Physical Exam Constitutional: Well nourished and well developed . No acute distress.  Pulmonary: No respiratory distress and normal respiratory rhythm and effort.  Cardiovascular: Heart rate and rhythm are normal . No peripheral edema.  Neuro/Psych:. Mood and affect are appropriate.    Results/Data Urine [Data Includes: Last 1 Day]   16Dec2015  COLOR RED   APPEARANCE TURBID   SPECIFIC GRAVITY 1.025   pH 6.5   GLUCOSE NEG mg/dL  BILIRUBIN NEG   KETONE NEG mg/dL  BLOOD LARGE   PROTEIN 100 mg/dL  UROBILINOGEN 0.2 mg/dL  NITRITE NEG   LEUKOCYTE ESTERASE MOD   SQUAMOUS EPITHELIAL/HPF NONE SEEN   WBC 3-6 WBC/hpf  RBC TNTC RBC/hpf  BACTERIA RARE   CRYSTALS NONE SEEN   CASTS NONE SEEN    Old records or history reviewed:Marland Kitchen.  The following images/tracing/specimen were independently visualized:  CT.    Assessment Assessed  1. Bacteriuria, asymptomatic (N39.0) 2. Calculus of left ureter (N20.1) 3. Nephrolithiasis (N20.0) 4. Localized edema (R60.0) 5. Constipation, acute (K59.00)  Plan  Calculus of left ureter  1. Follow-up Schedule Surgery Office  Follow-up  Status: Hold For - Appointment   Requested for: 16Dec2015 Calculus of left ureter, Hydronephrosis, left  2. KUB; Status:Canceled - Date of Service;  Health Maintenance  3. UA With REFLEX; [Do Not Release]; Status:Complete;   Done: 16Dec2015 07:46AM Nephrolithiasis  4. KUB; Status:Canceled - Exact Date,Date of Service;   Left URS, HLL, stent - staged      URINE CULTURE; Status:In Progress - Specimen/Data Collected;  Done: 16Dec2015  Perform:[facility]; Due:18Dec2015; Marked Important; Last Updated ZO:XWRUEAVBy:[place], Marthenia RollingHugo; 05/11/2014 9:05:54 AM;Ordered; Today;   WUJ:[id number]:Bacteriuria, asymptomatic; Ordered AO:ZHYQMVHQBy:Read Bonelli;   Discussion/Summary Nephrolithiasis, ureteral  stone, left-Discussed with patient and his daughter the nature risk and benefits of shockwave lithotripsy of the ureteral stone with stent removal, staged ureteroscopy holmium laser lithotripsy and stent or PCNL. All questions answered. I drew them a picture the anatomy and procedures. We discussed the likelihood of success with each procedure and need for staged procedure. All questions answered. We'll proceed with ureteroscopy.    Constipation-can try milk of magnesia, MiraLAX, Dulcolax or enema. Drink plenty of fluids and fiber.    Lower extremity edema-chronic. I told him to check with Dr. Andrey CampanileWilson about his left lower extremity edema.    Gross hematuria, asymptomatic bacteriuria-expected with the ureteral stent. I sent urine for culture as a precaution.   Signatures Electronically signed by : Jerilee FieldMatthew Shon Indelicato, M.D.; May 11 2014  9:09AM EST    Addendum: Pt underwent left URS/HLL and stent replacement. Residual LLP stone not accessible with URS. Plan left ESWL.

## 2014-06-02 ENCOUNTER — Ambulatory Visit (HOSPITAL_COMMUNITY): Payer: Medicare Other

## 2014-06-02 ENCOUNTER — Ambulatory Visit (HOSPITAL_COMMUNITY)
Admission: RE | Admit: 2014-06-02 | Discharge: 2014-06-02 | Disposition: A | Discharge status: Home / Self Care | Payer: Medicare Other | Source: Ambulatory Visit | Attending: Urology | Admitting: Urology

## 2014-06-02 ENCOUNTER — Encounter (HOSPITAL_COMMUNITY)
Admission: RE | Disposition: A | Discharge status: Home / Self Care | Payer: Self-pay | Source: Ambulatory Visit | Attending: Urology

## 2014-06-02 ENCOUNTER — Encounter (HOSPITAL_COMMUNITY): Payer: Self-pay | Admitting: General Practice

## 2014-06-02 DIAGNOSIS — R310 Gross hematuria: Secondary | ICD-10-CM | POA: Insufficient documentation

## 2014-06-02 DIAGNOSIS — K5900 Constipation, unspecified: Secondary | ICD-10-CM | POA: Diagnosis not present

## 2014-06-02 DIAGNOSIS — M109 Gout, unspecified: Secondary | ICD-10-CM | POA: Insufficient documentation

## 2014-06-02 DIAGNOSIS — R600 Localized edema: Secondary | ICD-10-CM | POA: Insufficient documentation

## 2014-06-02 DIAGNOSIS — Z87891 Personal history of nicotine dependence: Secondary | ICD-10-CM | POA: Diagnosis not present

## 2014-06-02 DIAGNOSIS — N390 Urinary tract infection, site not specified: Secondary | ICD-10-CM | POA: Diagnosis not present

## 2014-06-02 DIAGNOSIS — N200 Calculus of kidney: Secondary | ICD-10-CM

## 2014-06-02 DIAGNOSIS — Z79899 Other long term (current) drug therapy: Secondary | ICD-10-CM | POA: Diagnosis not present

## 2014-06-02 DIAGNOSIS — E119 Type 2 diabetes mellitus without complications: Secondary | ICD-10-CM | POA: Diagnosis not present

## 2014-06-02 DIAGNOSIS — Z833 Family history of diabetes mellitus: Secondary | ICD-10-CM | POA: Insufficient documentation

## 2014-06-02 DIAGNOSIS — Z7982 Long term (current) use of aspirin: Secondary | ICD-10-CM | POA: Diagnosis not present

## 2014-06-02 DIAGNOSIS — N202 Calculus of kidney with calculus of ureter: Secondary | ICD-10-CM | POA: Insufficient documentation

## 2014-06-02 SURGERY — LITHOTRIPSY, ESWL
Anesthesia: LOCAL | Laterality: Left

## 2014-06-02 MED ORDER — CIPROFLOXACIN HCL 500 MG PO TABS
500.0000 mg | ORAL_TABLET | ORAL | Status: AC
  Administered 2014-06-02: 500 mg via ORAL
  Filled 2014-06-02: qty 1

## 2014-06-02 MED ORDER — DIPHENHYDRAMINE HCL 25 MG PO CAPS
25.0000 mg | ORAL_CAPSULE | ORAL | Status: AC
  Administered 2014-06-02: 25 mg via ORAL
  Filled 2014-06-02: qty 1

## 2014-06-02 MED ORDER — DIAZEPAM 5 MG PO TABS
10.0000 mg | ORAL_TABLET | ORAL | Status: AC
  Administered 2014-06-02: 10 mg via ORAL
  Filled 2014-06-02: qty 2

## 2014-06-02 MED ORDER — SODIUM CHLORIDE 0.9 % IV SOLN
INTRAVENOUS | Status: DC
  Administered 2014-06-02: 07:00:00 via INTRAVENOUS

## 2014-06-02 NOTE — Interval H&P Note (Signed)
History and Physical Interval Note:  06/02/2014 8:00 AM  Ronaldo Miyamotoommy C Stigall  has presented today for surgery, with the diagnosis of LEFT LOWER POLE STONE   The various methods of treatment have been discussed with the patient and family. After consideration of risks, benefits and other options for treatment, the patient has consented to  Procedure(s): LEFT EXTRACORPOREAL SHOCK WAVE LITHOTRIPSY (ESWL) (Left) as a surgical intervention .  The patient's history has been reviewed, patient examined, no change in status, stable for surgery.  I have reviewed the patient's chart and labs.  Questions were answered to the patient's satisfaction.  Pt without fever, bladder pain or dysuria today. Entry delayed due to computer issues. He elects to proceed. Stable for ESWL, LEFT.   Gordon Novak

## 2014-06-02 NOTE — Discharge Instructions (Signed)

## 2014-06-02 NOTE — Op Note (Signed)
[facility]  Left ESWL 12 mm LLP stone - pt pre-stented

## 2014-06-07 ENCOUNTER — Ambulatory Visit (HOSPITAL_BASED_OUTPATIENT_CLINIC_OR_DEPARTMENT_OTHER): Admission: RE | Admit: 2014-06-07 | Payer: Medicare Other | Source: Ambulatory Visit | Admitting: Urology

## 2014-06-07 ENCOUNTER — Encounter (HOSPITAL_BASED_OUTPATIENT_CLINIC_OR_DEPARTMENT_OTHER): Admission: RE | Payer: Self-pay | Source: Ambulatory Visit

## 2014-06-07 SURGERY — CYSTOURETEROSCOPY, WITH STENT INSERTION
Anesthesia: General

## 2018-10-18 ENCOUNTER — Inpatient Hospital Stay (HOSPITAL_COMMUNITY)
Admission: EM | Admit: 2018-10-18 | Discharge: 2018-10-20 | DRG: 247 | Disposition: A | Discharge status: Home / Self Care | Payer: Medicare Other | Attending: Cardiovascular Disease | Admitting: Cardiovascular Disease

## 2018-10-18 ENCOUNTER — Other Ambulatory Visit: Payer: Self-pay

## 2018-10-18 ENCOUNTER — Inpatient Hospital Stay (HOSPITAL_COMMUNITY): Payer: Medicare Other

## 2018-10-18 ENCOUNTER — Emergency Department (HOSPITAL_COMMUNITY): Payer: Medicare Other

## 2018-10-18 ENCOUNTER — Encounter (HOSPITAL_COMMUNITY): Payer: Self-pay | Admitting: Emergency Medicine

## 2018-10-18 ENCOUNTER — Inpatient Hospital Stay (HOSPITAL_COMMUNITY)
Admission: EM | Disposition: A | Discharge status: Home / Self Care | Payer: Self-pay | Source: Home / Self Care | Attending: Cardiovascular Disease

## 2018-10-18 DIAGNOSIS — I213 ST elevation (STEMI) myocardial infarction of unspecified site: Secondary | ICD-10-CM | POA: Diagnosis present

## 2018-10-18 DIAGNOSIS — R079 Chest pain, unspecified: Secondary | ICD-10-CM

## 2018-10-18 DIAGNOSIS — Z7952 Long term (current) use of systemic steroids: Secondary | ICD-10-CM

## 2018-10-18 DIAGNOSIS — I2119 ST elevation (STEMI) myocardial infarction involving other coronary artery of inferior wall: Principal | ICD-10-CM | POA: Diagnosis present

## 2018-10-18 DIAGNOSIS — E785 Hyperlipidemia, unspecified: Secondary | ICD-10-CM

## 2018-10-18 DIAGNOSIS — H5462 Unqualified visual loss, left eye, normal vision right eye: Secondary | ICD-10-CM | POA: Diagnosis present

## 2018-10-18 DIAGNOSIS — I2111 ST elevation (STEMI) myocardial infarction involving right coronary artery: Secondary | ICD-10-CM

## 2018-10-18 DIAGNOSIS — Z7982 Long term (current) use of aspirin: Secondary | ICD-10-CM | POA: Diagnosis not present

## 2018-10-18 DIAGNOSIS — Z1159 Encounter for screening for other viral diseases: Secondary | ICD-10-CM | POA: Diagnosis not present

## 2018-10-18 DIAGNOSIS — M109 Gout, unspecified: Secondary | ICD-10-CM | POA: Diagnosis present

## 2018-10-18 DIAGNOSIS — Z79899 Other long term (current) drug therapy: Secondary | ICD-10-CM | POA: Diagnosis not present

## 2018-10-18 DIAGNOSIS — I10 Essential (primary) hypertension: Secondary | ICD-10-CM

## 2018-10-18 DIAGNOSIS — I2510 Atherosclerotic heart disease of native coronary artery without angina pectoris: Secondary | ICD-10-CM

## 2018-10-18 DIAGNOSIS — I255 Ischemic cardiomyopathy: Secondary | ICD-10-CM | POA: Diagnosis present

## 2018-10-18 DIAGNOSIS — Z955 Presence of coronary angioplasty implant and graft: Secondary | ICD-10-CM

## 2018-10-18 DIAGNOSIS — Z8249 Family history of ischemic heart disease and other diseases of the circulatory system: Secondary | ICD-10-CM | POA: Diagnosis not present

## 2018-10-18 DIAGNOSIS — R7303 Prediabetes: Secondary | ICD-10-CM | POA: Diagnosis present

## 2018-10-18 DIAGNOSIS — E7800 Pure hypercholesterolemia, unspecified: Secondary | ICD-10-CM | POA: Diagnosis not present

## 2018-10-18 DIAGNOSIS — I5022 Chronic systolic (congestive) heart failure: Secondary | ICD-10-CM

## 2018-10-18 DIAGNOSIS — I5032 Chronic diastolic (congestive) heart failure: Secondary | ICD-10-CM

## 2018-10-18 HISTORY — DX: Essential (primary) hypertension: I10

## 2018-10-18 HISTORY — DX: Other specified soft tissue disorders: M79.89

## 2018-10-18 HISTORY — DX: Bladder-neck obstruction: N32.0

## 2018-10-18 HISTORY — PX: LEFT HEART CATH AND CORONARY ANGIOGRAPHY: CATH118249

## 2018-10-18 HISTORY — PX: CORONARY/GRAFT ACUTE MI REVASCULARIZATION: CATH118305

## 2018-10-18 HISTORY — DX: ST elevation (STEMI) myocardial infarction involving other coronary artery of inferior wall: I21.19

## 2018-10-18 LAB — CBC
HCT: 52.1 % — ABNORMAL HIGH (ref 39.0–52.0)
Hemoglobin: 17.1 g/dL — ABNORMAL HIGH (ref 13.0–17.0)
MCH: 31.6 pg (ref 26.0–34.0)
MCHC: 32.8 g/dL (ref 30.0–36.0)
MCV: 96.3 fL (ref 80.0–100.0)
Platelets: 195 10*3/uL (ref 150–400)
RBC: 5.41 MIL/uL (ref 4.22–5.81)
RDW: 13.8 % (ref 11.5–15.5)
WBC: 7.4 10*3/uL (ref 4.0–10.5)
nRBC: 0 % (ref 0.0–0.2)

## 2018-10-18 LAB — BASIC METABOLIC PANEL
Anion gap: 15 (ref 5–15)
BUN: 18 mg/dL (ref 8–23)
CO2: 26 mmol/L (ref 22–32)
Calcium: 9.9 mg/dL (ref 8.9–10.3)
Chloride: 103 mmol/L (ref 98–111)
Creatinine, Ser: 1.21 mg/dL (ref 0.61–1.24)
GFR calc Af Amer: >60 mL/min (ref >60)
GFR calc non Af Amer: 56 mL/min — ABNORMAL LOW (ref >60)
Glucose, Bld: 163 mg/dL — ABNORMAL HIGH (ref 70–99)
Potassium: 4.2 mmol/L (ref 3.5–5.1)
Sodium: 144 mmol/L (ref 135–145)

## 2018-10-18 LAB — TROPONIN I: Troponin I: <0.03 ng/mL (ref <0.03)

## 2018-10-18 LAB — ECHOCARDIOGRAM COMPLETE
Height: 72 in
Weight: 2880 oz

## 2018-10-18 LAB — APTT: aPTT: 28 seconds (ref 24–36)

## 2018-10-18 LAB — PROTIME-INR
INR: 1 (ref 0.8–1.2)
Prothrombin Time: 12.9 s (ref 11.4–15.2)

## 2018-10-18 LAB — SARS CORONAVIRUS 2 BY RT PCR (HOSPITAL ORDER, PERFORMED IN ~~LOC~~ HOSPITAL LAB): SARS Coronavirus 2: NEGATIVE

## 2018-10-18 SURGERY — CORONARY/GRAFT ACUTE MI REVASCULARIZATION
Anesthesia: LOCAL

## 2018-10-18 MED ORDER — ACETAMINOPHEN 325 MG PO TABS: 650.0000 mg | ORAL_TABLET | ORAL | Status: DC | PRN

## 2018-10-18 MED ORDER — LIDOCAINE HCL (PF) 1 % IJ SOLN
INTRAMUSCULAR | Status: AC
  Filled 2018-10-18: qty 30

## 2018-10-18 MED ORDER — HEPARIN SODIUM (PORCINE) 1000 UNIT/ML IJ SOLN
INTRAMUSCULAR | Status: AC
  Filled 2018-10-18: qty 1

## 2018-10-18 MED ORDER — MIRABEGRON ER 25 MG PO TB24
50.0000 mg | ORAL_TABLET | Freq: Every day | ORAL | Status: DC
  Administered 2018-10-19 – 2018-10-20 (×2): 50 mg via ORAL
  Filled 2018-10-18: qty 2
  Filled 2018-10-18: qty 1

## 2018-10-18 MED ORDER — NITROGLYCERIN IN D5W 200-5 MCG/ML-% IV SOLN
0.0000 ug/min | INTRAVENOUS | Status: DC
  Administered 2018-10-18: 10 ug/min via INTRAVENOUS
  Filled 2018-10-18: qty 250

## 2018-10-18 MED ORDER — OXYCODONE-ACETAMINOPHEN 5-325 MG PO TABS: 2.0000 | ORAL_TABLET | Freq: Four times a day (QID) | ORAL | Status: DC | PRN

## 2018-10-18 MED ORDER — SODIUM CHLORIDE 0.9 % IV SOLN
INTRAVENOUS | Status: DC
  Administered 2018-10-18: 09:00:00 via INTRAVENOUS

## 2018-10-18 MED ORDER — ONDANSETRON HCL 4 MG/2ML IJ SOLN: 4.0000 mg | Freq: Four times a day (QID) | INTRAMUSCULAR | Status: DC | PRN

## 2018-10-18 MED ORDER — TICAGRELOR 90 MG PO TABS
ORAL_TABLET | ORAL | Status: DC | PRN
  Administered 2018-10-18: 180 mg via ORAL

## 2018-10-18 MED ORDER — HEPARIN SODIUM (PORCINE) 5000 UNIT/ML IJ SOLN
4000.0000 [IU] | Freq: Once | INTRAMUSCULAR | Status: AC
  Administered 2018-10-18: 4000 [IU] via INTRAVENOUS
  Filled 2018-10-18: qty 1

## 2018-10-18 MED ORDER — HYDROCORT-PRAMOXINE (PERIANAL) 1-1 % EX FOAM
1.0000 | Freq: Three times a day (TID) | CUTANEOUS | Status: DC | PRN
  Filled 2018-10-18: qty 10

## 2018-10-18 MED ORDER — NITROGLYCERIN 1 MG/10 ML FOR IR/CATH LAB
INTRA_ARTERIAL | Status: AC
  Filled 2018-10-18: qty 10

## 2018-10-18 MED ORDER — ASPIRIN 81 MG PO CHEW
81.0000 mg | CHEWABLE_TABLET | Freq: Every day | ORAL | Status: DC
  Administered 2018-10-18: 81 mg via ORAL
  Filled 2018-10-18: qty 1

## 2018-10-18 MED ORDER — HEPARIN (PORCINE) IN NACL 1000-0.9 UT/500ML-% IV SOLN
INTRAVENOUS | Status: AC
  Filled 2018-10-18: qty 1000

## 2018-10-18 MED ORDER — SODIUM CHLORIDE 0.9% FLUSH: 3.0000 mL | INTRAVENOUS | Status: DC | PRN

## 2018-10-18 MED ORDER — VERAPAMIL HCL 2.5 MG/ML IV SOLN
INTRAVENOUS | Status: AC
  Filled 2018-10-18: qty 2

## 2018-10-18 MED ORDER — TAMSULOSIN HCL 0.4 MG PO CAPS
0.4000 mg | ORAL_CAPSULE | Freq: Every morning | ORAL | Status: DC
  Administered 2018-10-18 – 2018-10-20 (×3): 0.4 mg via ORAL
  Filled 2018-10-18 (×3): qty 1

## 2018-10-18 MED ORDER — TICAGRELOR 90 MG PO TABS
ORAL_TABLET | ORAL | Status: AC
  Filled 2018-10-18: qty 1

## 2018-10-18 MED ORDER — HEPARIN (PORCINE) IN NACL 1000-0.9 UT/500ML-% IV SOLN
INTRAVENOUS | Status: DC | PRN
  Administered 2018-10-18 (×2): 500 mL

## 2018-10-18 MED ORDER — NITROGLYCERIN 0.4 MG SL SUBL
0.4000 mg | SUBLINGUAL_TABLET | Freq: Once | SUBLINGUAL | Status: AC
  Administered 2018-10-18: 09:00:00 0.4 mg via SUBLINGUAL

## 2018-10-18 MED ORDER — ASPIRIN 81 MG PO CHEW
324.0000 mg | CHEWABLE_TABLET | Freq: Once | ORAL | Status: AC
  Administered 2018-10-18: 09:00:00 324 mg via ORAL
  Filled 2018-10-18: qty 4

## 2018-10-18 MED ORDER — SODIUM CHLORIDE 0.9% FLUSH
3.0000 mL | Freq: Once | INTRAVENOUS | Status: AC
  Administered 2018-10-18: 3 mL via INTRAVENOUS

## 2018-10-18 MED ORDER — DSS 100 MG PO CAPS: 100.0000 mg | ORAL_CAPSULE | Freq: Every day | ORAL | Status: DC

## 2018-10-18 MED ORDER — LISINOPRIL 10 MG PO TABS
10.0000 mg | ORAL_TABLET | Freq: Every morning | ORAL | Status: DC
  Administered 2018-10-18 – 2018-10-20 (×3): 10 mg via ORAL
  Filled 2018-10-18 (×3): qty 1

## 2018-10-18 MED ORDER — ATORVASTATIN CALCIUM 80 MG PO TABS: 80.0000 mg | ORAL_TABLET | Freq: Every day | ORAL | Status: DC

## 2018-10-18 MED ORDER — LIDOCAINE HCL (PF) 1 % IJ SOLN
INTRAMUSCULAR | Status: DC | PRN
  Administered 2018-10-18: 5 mL

## 2018-10-18 MED ORDER — HEPARIN (PORCINE) 25000 UT/250ML-% IV SOLN
1000.0000 [IU]/h | INTRAVENOUS | Status: DC
  Administered 2018-10-18: 09:00:00 1000 [IU]/h via INTRAVENOUS
  Filled 2018-10-18: qty 250

## 2018-10-18 MED ORDER — BISACODYL 5 MG PO TBEC
5.0000 mg | DELAYED_RELEASE_TABLET | Freq: Every day | ORAL | Status: DC
  Administered 2018-10-19 – 2018-10-20 (×2): 5 mg via ORAL
  Filled 2018-10-18 (×2): qty 1

## 2018-10-18 MED ORDER — FUROSEMIDE 20 MG PO TABS
20.0000 mg | ORAL_TABLET | Freq: Every morning | ORAL | Status: DC
  Administered 2018-10-18 – 2018-10-20 (×3): 20 mg via ORAL
  Filled 2018-10-18 (×3): qty 1

## 2018-10-18 MED ORDER — ALLOPURINOL 300 MG PO TABS
300.0000 mg | ORAL_TABLET | Freq: Every morning | ORAL | Status: DC
  Administered 2018-10-18 – 2018-10-20 (×3): 300 mg via ORAL
  Filled 2018-10-18 (×3): qty 1

## 2018-10-18 MED ORDER — NITROGLYCERIN 0.4 MG SL SUBL
SUBLINGUAL_TABLET | SUBLINGUAL | Status: AC
  Administered 2018-10-18: 09:00:00 0.4 mg via SUBLINGUAL
  Filled 2018-10-18: qty 1

## 2018-10-18 MED ORDER — LABETALOL HCL 5 MG/ML IV SOLN: 10.0000 mg | INTRAVENOUS | Status: AC | PRN

## 2018-10-18 MED ORDER — NITROGLYCERIN 0.4 MG SL SUBL: 0.4000 mg | SUBLINGUAL_TABLET | SUBLINGUAL | Status: DC | PRN

## 2018-10-18 MED ORDER — SODIUM CHLORIDE 0.9 % IV SOLN
INTRAVENOUS | Status: DC | PRN
  Administered 2018-10-18: 10 mL/h via INTRAVENOUS

## 2018-10-18 MED ORDER — HYDRALAZINE HCL 20 MG/ML IJ SOLN: 10.0000 mg | INTRAMUSCULAR | Status: AC | PRN

## 2018-10-18 MED ORDER — SODIUM CHLORIDE 0.9% FLUSH
3.0000 mL | Freq: Two times a day (BID) | INTRAVENOUS | Status: DC
  Administered 2018-10-18 – 2018-10-19 (×3): 3 mL via INTRAVENOUS

## 2018-10-18 MED ORDER — HEPARIN SODIUM (PORCINE) 1000 UNIT/ML IJ SOLN
INTRAMUSCULAR | Status: DC | PRN
  Administered 2018-10-18: 6000 [IU] via INTRAVENOUS

## 2018-10-18 MED ORDER — IOHEXOL 350 MG/ML SOLN
INTRAVENOUS | Status: DC | PRN
  Administered 2018-10-18: 95 mL via INTRA_ARTERIAL

## 2018-10-18 MED ORDER — SODIUM CHLORIDE 0.9 % IV SOLN: 250.0000 mL | INTRAVENOUS | Status: DC | PRN

## 2018-10-18 MED ORDER — ASPIRIN 81 MG PO CHEW: 324.0000 mg | CHEWABLE_TABLET | Freq: Once | ORAL | Status: DC

## 2018-10-18 MED ORDER — ATORVASTATIN CALCIUM 80 MG PO TABS
80.0000 mg | ORAL_TABLET | Freq: Every day | ORAL | Status: DC
  Administered 2018-10-18 – 2018-10-19 (×2): 80 mg via ORAL
  Filled 2018-10-18 (×2): qty 1

## 2018-10-18 MED ORDER — TICAGRELOR 90 MG PO TABS
90.0000 mg | ORAL_TABLET | Freq: Two times a day (BID) | ORAL | Status: DC
  Administered 2018-10-18 – 2018-10-20 (×4): 90 mg via ORAL
  Filled 2018-10-18 (×4): qty 1

## 2018-10-18 MED ORDER — HYDROCORTISONE (PERIANAL) 2.5 % EX CREA
TOPICAL_CREAM | Freq: Three times a day (TID) | CUTANEOUS | Status: DC | PRN
  Filled 2018-10-18: qty 28.35

## 2018-10-18 MED ORDER — VERAPAMIL HCL 2.5 MG/ML IV SOLN
INTRAVENOUS | Status: DC | PRN
  Administered 2018-10-18: 10 mL via INTRA_ARTERIAL

## 2018-10-18 MED ORDER — METOPROLOL TARTRATE 12.5 MG HALF TABLET
12.5000 mg | ORAL_TABLET | Freq: Two times a day (BID) | ORAL | Status: DC
  Administered 2018-10-18 – 2018-10-20 (×5): 12.5 mg via ORAL
  Filled 2018-10-18 (×5): qty 1

## 2018-10-18 MED ORDER — DOCUSATE SODIUM 100 MG PO CAPS
100.0000 mg | ORAL_CAPSULE | Freq: Every day | ORAL | Status: DC
  Administered 2018-10-18 – 2018-10-19 (×2): 100 mg via ORAL
  Filled 2018-10-18 (×2): qty 1

## 2018-10-18 MED ORDER — ASPIRIN EC 81 MG PO TBEC
81.0000 mg | DELAYED_RELEASE_TABLET | Freq: Every day | ORAL | Status: DC
  Administered 2018-10-19 – 2018-10-20 (×2): 81 mg via ORAL
  Filled 2018-10-18 (×3): qty 1

## 2018-10-18 MED ORDER — PREDNISOLONE ACETATE 1 % OP SUSP
1.0000 [drp] | Freq: Every day | OPHTHALMIC | Status: DC
  Administered 2018-10-18 – 2018-10-19 (×2): 1 [drp] via OPHTHALMIC
  Filled 2018-10-18 (×2): qty 5

## 2018-10-18 MED ORDER — SODIUM CHLORIDE 0.9 % IV SOLN
INTRAVENOUS | Status: AC
  Administered 2018-10-18: 12:00:00 via INTRAVENOUS

## 2018-10-18 MED ORDER — SODIUM CHLORIDE 0.9 % IV SOLN
INTRAVENOUS | Status: AC | PRN
  Administered 2018-10-18: 10 mL/h via INTRAVENOUS

## 2018-10-18 MED ORDER — MORPHINE SULFATE (PF) 2 MG/ML IV SOLN: 2.0000 mg | INTRAVENOUS | Status: DC | PRN

## 2018-10-18 SURGICAL SUPPLY — 19 items
BALLN SAPPHIRE 2.0X12 (BALLOONS) ×2
BALLN SAPPHIRE ~~LOC~~ 3.25X15 (BALLOONS) ×1 IMPLANT
BALLOON SAPPHIRE 2.0X12 (BALLOONS) IMPLANT
CATH OPTITORQUE TIG 4.0 5F (CATHETERS) ×1 IMPLANT
CATH VISTA GUIDE 6FR JR4 (CATHETERS) ×1 IMPLANT
DEVICE RAD COMP TR BAND LRG (VASCULAR PRODUCTS) ×1 IMPLANT
GLIDESHEATH SLEND A-KIT 6F 22G (SHEATH) ×1 IMPLANT
GUIDEWIRE INQWIRE 1.5J.035X260 (WIRE) IMPLANT
INQWIRE 1.5J .035X260CM (WIRE) ×2
KIT ENCORE 26 ADVANTAGE (KITS) ×1 IMPLANT
KIT HEART LEFT (KITS) ×2 IMPLANT
PACK CARDIAC CATHETERIZATION (CUSTOM PROCEDURE TRAY) ×2 IMPLANT
SHEATH PINNACLE 6F 10CM (SHEATH) ×1 IMPLANT
STENT SYNERGY DES 3X28 (Permanent Stent) ×1 IMPLANT
TRANSDUCER W/STOPCOCK (MISCELLANEOUS) ×2 IMPLANT
TUBING CIL FLEX 10 FLL-RA (TUBING) ×2 IMPLANT
WIRE ASAHI PROWATER 180CM (WIRE) ×1 IMPLANT
WIRE EMERALD 3MM-J .035X150CM (WIRE) ×1 IMPLANT
WIRE HI TORQ VERSACORE-J 145CM (WIRE) ×1 IMPLANT

## 2018-10-18 NOTE — ED Triage Notes (Signed)
Pt c/o mid cp radiating to left arm, no nausea no SOB.

## 2018-10-18 NOTE — Progress Notes (Signed)
This RN went to patient's room at 1515 to remove 1 cc of air from TR band. Site level 2 with palpable hematoma and oozing slightly from site (cath lab aware by previous RN). At 1530 this RN went in to remove 2 cc of air from TR band. When removing it, TR band balloon flat without any air other than those 2 ccs. Site still level 2 with same palpable hematoma and oozing slightly from site. Will remove TR band at 1630 to assess site and band for patency better.

## 2018-10-18 NOTE — Progress Notes (Signed)
Admitted to 2 H ICu post stent placement denies chest pain or any pain. pain. o2 sats 93% placed on o2 at 2 LPM. Moving and turning well, right fingers with sensation, pink good cap refill, positve 02 saturation  Waveform. Right arm elevated on pillow,instructed patient not to use.

## 2018-10-18 NOTE — Progress Notes (Signed)
ANTICOAGULATION CONSULT NOTE - Initial Consult  Pharmacy Consult for Heparin Indication: chest pain/ACS  No Known Allergies  Patient Measurements: Height: 6' (182.9 cm) Weight: 180 lb (81.6 kg) IBW/kg (Calculated) : 77.6 Heparin Dosing Weight: 81.6 kg  Vital Signs: Temp: 97.7 F (36.5 C) (05/24 0614) Temp Source: Oral (05/24 0614) BP: 136/74 (05/24 0845) Pulse Rate: 89 (05/24 0845)  Labs: Recent Labs    10/18/18 0625  HGB 17.1*  HCT 52.1*  PLT 195  CREATININE 1.21  TROPONINI <0.03    Estimated Creatinine Clearance: 52.6 mL/min (by C-G formula based on SCr of 1.21 mg/dL).   Medical History: Past Medical History:  Diagnosis Date  . Blindness of left eye   . Gout   . Hemorrhoids 05/12/2014  . Kidney stones   . Nephrolithiasis   . Pre-diabetes     Assessment: 49 YOM who presented on 5/24 with CP concerning for STEMI. Pharmacy consulted for Heparin dosing.   No anticoagulation noted PTA, Hgb 17.1, plts wnl  Goal of Therapy:  Heparin level 0.3-0.7 units/ml Monitor platelets by anticoagulation protocol: Yes   Plan:  - Heparin 4000 units x 1 (already given) - Start Heparin at a rate of 1000 units/hr (10 ml/hr) - Will continue to monitor for any signs/symptoms of bleeding and will follow up with heparin level in 8 hours vs post-cath plans  Thank you for allowing pharmacy to be a part of this patient's care.  Georgina Pillion, PharmD, BCPS Clinical Pharmacist Clinical phone for 10/18/2018: [phone] 10/18/2018 9:04 AM   **Pharmacist phone directory can now be found on [email or web address] ([email or web address]).  Listed under [facility].

## 2018-10-18 NOTE — Progress Notes (Signed)
[facility] med list updated

## 2018-10-18 NOTE — ED Notes (Signed)
Gordon Sabal, MD and Brion Aliment, NP with Cariology at bedside.

## 2018-10-18 NOTE — ED Notes (Signed)
Nurse starting IV and will draw labs. 

## 2018-10-18 NOTE — ED Notes (Signed)
Activated code Stemi with care link spoke with phil

## 2018-10-18 NOTE — ED Provider Notes (Signed)
[facility] EMERGENCY DEPARTMENT Provider Note   CSN: [id number] Arrival date & time: 10/18/18  8295    History   Chief Complaint Chief Complaint  Patient presents with  . Chest Pain    HPI Gordon Novak is a 84 y.o. male.  HPI: A 84 year old patient with a history of treated diabetes and hypertension presents for evaluation of chest pain. Initial onset of pain was approximately 1-3 hours ago. The patient's chest pain is described as heaviness/pressure/tightness and is not worse with exertion. The patient's chest pain is middle- or left-sided, is not well-localized, is not sharp and does not radiate to the arms/jaw/neck. The patient does not complain of nausea and denies diaphoresis. The patient has no history of stroke, has no history of peripheral artery disease, has not smoked in the past 90 days, has no relevant family history of coronary artery disease (first degree relative at less than age 37), has no history of hypercholesterolemia and does not have an elevated BMI (>=30).  Patient took antacid without relief this time.  Pain resolved after arrival to ED without intervention to left pains approximately 2 hours the patient is pain-free HPI   Past Medical History:  Diagnosis Date  . Blindness of left eye   . Gout   . Hemorrhoids 05/12/2014  . Kidney stones   . Nephrolithiasis   . Pre-diabetes     Patient Active Problem List   Diagnosis Date Noted  . Proctitis 05/14/2014  . Urinary tract infection 05/12/2014  . SIRS (systemic inflammatory response syndrome) (HCC) 05/12/2014  . Leukocytosis 05/12/2014  . Tachycardia 05/12/2014  . Constipation 05/12/2014  . Hemorrhoids 05/12/2014  . Gout 05/12/2014  . Urinary tract infectious disease   . Nephrolithiasis 05/07/2014    Past Surgical History:  Procedure Laterality Date  . CATARACT EXTRACTION    . CYSTOSCOPY W/ URETERAL STENT PLACEMENT Left 05/07/2014   Procedure: CYSTOSCOPY WITH RETROGRADE  PYELOGRAM/ LEFT URETERAL STENT PLACEMENT;  Surgeon: Crist Fat, MD;  Location: [facility];  Service: Urology;  Laterality: Left;  . CYSTOSCOPY WITH RETROGRADE PYELOGRAM, URETEROSCOPY AND STENT PLACEMENT Left 05/14/2014   Procedure: LEFT URETEROSCOPY AND STENT EXCHANGE,STONE BASKETRY EXTRACTION;  Surgeon: Jerilee Field, MD;  Location: [facility];  Service: Urology;  Laterality: Left;  . HOLMIUM LASER APPLICATION Left 05/14/2014   Procedure: HOLMIUM LASER APPLICATION;  Surgeon: Jerilee Field, MD;  Location: [facility];  Service: Urology;  Laterality: Left;  . INTRAOCULAR LENS INSERTION Right         Home Medications    Prior to Admission medications   Medication Sig Start Date End Date Taking? Authorizing Provider  allopurinol (ZYLOPRIM) 300 MG tablet Take 300 mg by mouth every morning.  02/15/14   [provider]  aspirin 325 MG tablet Take 325 mg by mouth every morning.     [provider]  beta carotene w/minerals (OCUVITE) tablet Take 1 tablet by mouth every morning.     [provider]  bisacodyl (DULCOLAX) 5 MG EC tablet Take 1 tablet (5 mg total) by mouth daily. 05/14/14   Catarina Hartshorn, MD  cephALEXin (KEFLEX) 500 MG capsule Take 1 capsule (500 mg total) by mouth 2 (two) times daily. Patient not taking: Reported on 05/17/2014 05/14/14   Jerilee Field, MD  ciprofloxacin (CIPRO) 500 MG tablet Take 1 tablet (500 mg total) by mouth 2 (two) times daily. Patient not taking: Reported on 06/02/2014 05/14/14   Catarina Hartshorn, MD  docusate sodium 100 MG CAPS Take  100 mg by mouth 2 (two) times daily. Patient taking differently: Take 100 mg by mouth at bedtime.  05/14/14   Catarina Hartshornat, David, MD  furosemide (LASIX) 20 MG tablet Take 20 mg by mouth every morning.  02/15/14   [provider]  lisinopril (PRINIVIL,ZESTRIL) 10 MG tablet Take 10 mg by mouth every morning.  02/15/14   [provider]  metroNIDAZOLE (FLAGYL) 500 MG tablet Take 1 tablet (500 mg total) by  mouth 3 (three) times daily. Patient not taking: Reported on 06/02/2014 05/14/14   Catarina Hartshornat, David, MD  mirabegron ER (MYRBETRIQ) 50 MG TB24 tablet Take 1 tablet (50 mg total) by mouth daily. 05/08/14   Crist FatHerrick, Benjamin W, MD  Multiple Vitamin (MULTIVITAMIN WITH MINERALS) TABS tablet Take 1 tablet by mouth every evening.     [provider]  oxyCODONE-acetaminophen (PERCOCET/ROXICET) 5-325 MG per tablet Take 2 tablets by mouth every 6 (six) hours as needed for severe pain.  05/05/14   [provider]  oxyCODONE-acetaminophen (ROXICET) 5-325 MG per tablet Take 1-2 tablets by mouth every 6 (six) hours as needed for severe pain. Patient not taking: Reported on 05/17/2014 05/14/14   Jerilee FieldEskridge, Matthew, MD  prednisoLONE acetate (PRED FORTE) 1 % ophthalmic suspension Place 1 drop into both eyes at bedtime.  05/09/14   [provider]  PROCTOFOAM [facility] rectal foam Place 1 applicator rectally 3 (three) times daily as needed for hemorrhoids or itching.  05/16/14   [provider]  tamsulosin (FLOMAX) 0.4 MG CAPS capsule Take 2 capsules (0.8 mg total) by mouth daily. Patient taking differently: Take 0.4 mg by mouth every morning.  05/08/14   Crist FatHerrick, Benjamin W, MD  valACYclovir (VALTREX) 500 MG tablet Take 500 mg by mouth 2 (two) times daily as needed (cold sores).    [provider]    Family History No family history on file.  Social History Social History   Tobacco Use  . Smoking status: Never Smoker  . Smokeless tobacco: Never Used  Substance Use Topics  . Alcohol use: No  . Drug use: No     Allergies   Patient has no known allergies.   Review of Systems Review of Systems   Physical Exam Updated Vital Signs BP 117/60   Pulse 81   Temp 97.7 F (36.5 C) (Oral)   Resp 16   Ht 1.829 m (6')   Wt 81.6 kg   SpO2 94%   BMI 24.41 kg/m   Physical Exam Vitals signs and nursing note reviewed.  Constitutional:      Appearance: He is  well-developed.  HENT:     Head: Normocephalic and atraumatic.     Right Ear: External ear normal.     Left Ear: External ear normal.     Nose: Nose normal.  Eyes:     Conjunctiva/sclera: Conjunctivae normal.     Comments: Left eye with traumatic cataract Disconjugate gaze at rest  Neck:     Musculoskeletal: Normal range of motion and neck supple.  Cardiovascular:     Rate and Rhythm: Normal rate and regular rhythm.     Heart sounds: Normal heart sounds.  Pulmonary:     Effort: Pulmonary effort is normal. No respiratory distress.     Breath sounds: Normal breath sounds. No wheezing.  Chest:     Chest wall: No tenderness.  Abdominal:     General: Bowel sounds are normal. There is no distension.     Palpations: Abdomen is soft. There is no  mass.     Tenderness: There is no abdominal tenderness. There is no guarding.  Musculoskeletal: Normal range of motion.     Left lower leg: Edema present.     Comments: Swelling lle  Skin:    General: Skin is warm and dry.  Neurological:     Mental Status: He is alert and oriented to person, place, and time.     Motor: No abnormal muscle tone.     Coordination: Coordination normal.     Deep Tendon Reflexes: Reflexes are normal and symmetric.  Psychiatric:        Behavior: Behavior normal.        Thought Content: Thought content normal.        Judgment: Judgment normal.      ED Treatments / Results  Labs (all labs ordered are listed, but only abnormal results are displayed) Labs Reviewed  CBC - Abnormal; Notable for the following components:      Result Value   Hemoglobin 17.1 (*)    HCT 52.1 (*)    All other components within normal limits  BASIC METABOLIC PANEL  TROPONIN I    EKG EKG Interpretation  Date/Time:  Sunday Oct 18 2018 06:55:28 EDT Ventricular Rate:  79 PR Interval:    QRS Duration: 94 QT Interval:  354 QTC Calculation: 406 R Axis:   57 Text Interpretation:  Normal sinus rhythm abnormal r wave progression  Confirmed by Margarita Grizzle [id number]) on 10/18/2018 7:22:02 AM  ED ECG REPORT STEMI  Date: 10/18/2018  Rate: 75  Rhythm: normal sinus rhythm  QRS Axis: normal  Intervals: normal  ST/T Wave abnormalities: ST elevations inferiorly  Conduction Disutrbances:none  Narrative Interpretation:   Old EKG Reviewed: changes noted STEMI I have personally reviewed the EKG tracing and agree with the computerized printout as noted.  ED ECG REPORT   Date: 10/18/2018  Rate: 83  Rhythm: normal sinus rhythm  QRS Axis: normal  Intervals: normal  ST/T Wave abnormalities: normal  Conduction Disutrbances:none  Narrative Interpretation:   Old EKG Reviewed: changes noted  I have personally reviewed the EKG tracing and agree with the computerized printout as noted. ST changes resolved from first prior and pain free with this tracing  Radiology Dg Chest 2 View  Result Date: 10/18/2018 CLINICAL DATA:  Chest pain EXAM: CHEST - 2 VIEW COMPARISON:  05/12/2014 FINDINGS: The heart size and mediastinal contours are within normal limits. Both lungs are clear. The visualized skeletal structures are unremarkable. IMPRESSION: No active cardiopulmonary disease. Electronically Signed   By: Marlan Palau M.D.   On: 10/18/2018 07:02    Procedures .Critical Care Performed by: Margarita Grizzle, MD Authorized by: Margarita Grizzle, MD   Critical care provider statement:    Critical care time (minutes):  45   Critical care end time:  10/18/2018 9:18 AM   Critical care was necessary to treat or prevent imminent or life-threatening deterioration of the following conditions:  Cardiac failure   Critical care was time spent personally by me on the following activities:  Discussions with consultants, evaluation of patient's response to treatment, examination of patient, ordering and performing treatments and interventions, ordering and review of laboratory studies, ordering and review of radiographic studies, pulse oximetry,  re-evaluation of patient's condition, obtaining history from patient or surrogate and review of old charts   (including critical care time)  Medications Ordered in ED Medications  sodium chloride flush (NS) 0.9 % injection 3 mL (has no administration in time range)  aspirin  chewable tablet 324 mg (has no administration in time range)     Initial Impression / Assessment and Plan / ED Course  I have reviewed the triage vital signs and the nursing notes.  Pertinent labs & imaging results that were available during my care of the patient were reviewed by me and considered in my medical decision making (see chart for details).     HEAR Score: 4  84 yo male ho face prediabetes dm, presents today with burning pressure-like substernal discomfort that awoke him, or was present on awakening from sleep this morning.  It resolved prior to my evaluation.  Patient does not have ischemic changes on EKG but does have early R wave transition Patient's heart score is 5 indicating he should be observed with troponins trended and further evaluation coronary artery disease. Left lower extremity erythema and swelling which patient reports is chronic and without change Left eye with traumatic cataract since 1954 8:32 AM Patient reevaluated and states that he is beginning to have chest pain again. Nitro 0.2 sublingual ordered.  Repeat EKG pending. Repeat EKG reviewed and has ST elevation in 2 3 and aVF.  Patient's pain is 5 out of 10.  Code STEMI activated. Heparin ordered with pharmacy dosing consult 8:57 AM Cussed with Dr. Gery Pray on call for STEMI Discussed with daughter, [name] who is with his wife in the car.  Phone number is [phone]. 9:07 AM Patient pain free after sl nitro and repeat ekg with st segments normalized 9:17 AM Cardiology at bedside Patient with some pain returning and forth EKG with ST elevation to 3 and aVF  Final Clinical Impressions(s) / ED Diagnoses   Final diagnoses:  ST  elevation myocardial infarction (STEMI), unspecified artery [facility])    ED Discharge Orders    None       Margarita Grizzle, MD 10/18/18 [phone]

## 2018-10-18 NOTE — Progress Notes (Signed)
  Echocardiogram 2D Echocardiogram has been performed.  Delcie Roch 10/18/2018, 12:52 PM

## 2018-10-18 NOTE — ED Notes (Signed)
Patient transported to X-ray 

## 2018-10-18 NOTE — H&P (Addendum)
History & Physical    Patient ID: Gordon Novak MRN: [id number], DOB/AGE: 82/12/1934   Admit date: 10/18/2018   Primary Physician: Barbie BannerWilson, Fred H, MD Primary Cardiologist: Nanetta BattyJonathan Secilia Apps, MD  Patient Profile    84 y/o ? with a h/o HTN, left eye blindness, bladder outlet obstruction, nephrolithiasis, gout, and chronic left lower ext swelling, who presented to the ED this AM w/ chest pain and inferior STEMI.  Past Medical History    Past Medical History:  Diagnosis Date   Bladder outlet obstruction    Blindness of left eye    Chronic Left leg swelling    a. Since 2007.   Essential hypertension    Gout    Hemorrhoids 05/12/2014   Nephrolithiasis    Pre-diabetes     Past Surgical History:  Procedure Laterality Date   CATARACT EXTRACTION     CYSTOSCOPY W/ URETERAL STENT PLACEMENT Left 05/07/2014   Procedure: CYSTOSCOPY WITH RETROGRADE PYELOGRAM/ LEFT URETERAL STENT PLACEMENT;  Surgeon: Crist FatBenjamin W Herrick, MD;  Location: [facility];  Service: Urology;  Laterality: Left;   CYSTOSCOPY WITH RETROGRADE PYELOGRAM, URETEROSCOPY AND STENT PLACEMENT Left 05/14/2014   Procedure: LEFT URETEROSCOPY AND STENT EXCHANGE,STONE BASKETRY EXTRACTION;  Surgeon: Jerilee FieldMatthew Eskridge, MD;  Location: [facility];  Service: Urology;  Laterality: Left;   HOLMIUM LASER APPLICATION Left 05/14/2014   Procedure: HOLMIUM LASER APPLICATION;  Surgeon: Jerilee FieldMatthew Eskridge, MD;  Location: [facility];  Service: Urology;  Laterality: Left;   INTRAOCULAR LENS INSERTION Right      Allergies  No Known Allergies  History of Present Illness    84 y/o ? w/ a h/o HTN, left eye blindness, bladder outlet obstruction, nephrolithiasis, gout, and chronic left lower ext swelling.  He has no prior cardiac history.  He [place]/ [place] and is relatively active, still push-mowing his lawn - which he did yesterday.  He was in his USOH until this AM, when he awoke @ ~ 4am with sscp.  This persisted and he eventually  [facility] where ECG was initially nl.  He has worsening of pain and repeat ECG showed subtle inferolateral ST elevation.  A code STEMI was activated.  With sl ntg  IV ntg, he had initial improvement in c/p but at the time of my interview, he had worsening w/ recurrent and more pronounced inferior ST elevation.  He has been hemodynamically stable and is now undergoing diag cath.  Home Medications    Prior to Admission medications   Medication Sig Start Date End Date Taking? Authorizing Provider  allopurinol (ZYLOPRIM) 300 MG tablet Take 300 mg by mouth every morning.  02/15/14   [provider]  aspirin 325 MG tablet Take 325 mg by mouth every morning.     [provider]  beta carotene w/minerals (OCUVITE) tablet Take 1 tablet by mouth every morning.     [provider]  bisacodyl (DULCOLAX) 5 MG EC tablet Take 1 tablet (5 mg total) by mouth daily. 05/14/14   Catarina Hartshornat, David, MD  docusate sodium 100 MG CAPS Take 100 mg by mouth 2 (two) times daily. Patient taking differently: Take 100 mg by mouth at bedtime.  05/14/14   Catarina Hartshornat, David, MD  furosemide (LASIX) 20 MG tablet Take 20 mg by mouth every morning.  02/15/14   [provider]  lisinopril (PRINIVIL,ZESTRIL) 10 MG tablet Take 10 mg by mouth every morning.  02/15/14   [provider]  mirabegron ER (MYRBETRIQ) 50 MG  TB24 tablet Take 1 tablet (50 mg total) by mouth daily. 05/08/14   Crist Fat, MD  Multiple Vitamin (MULTIVITAMIN WITH MINERALS) TABS tablet Take 1 tablet by mouth every evening.     [provider]  oxyCODONE-acetaminophen (PERCOCET/ROXICET) 5-325 MG per tablet Take 2 tablets by mouth every 6 (six) hours as needed for severe pain.  05/05/14   [provider]  prednisoLONE acetate (PRED FORTE) 1 % ophthalmic suspension Place 1 drop into both eyes at bedtime.  05/09/14   [provider]  PROCTOFOAM [facility] rectal foam Place 1 applicator rectally 3 (three)  times daily as needed for hemorrhoids or itching.  05/16/14   [provider]  tamsulosin (FLOMAX) 0.4 MG CAPS capsule Take 2 capsules (0.8 mg total) by mouth daily. Patient taking differently: Take 0.4 mg by mouth every morning.  05/08/14   Crist Fat, MD  valACYclovir (VALTREX) 500 MG tablet Take 500 mg by mouth 2 (two) times daily as needed (cold sores).    [provider]    Family History    Family History  Problem Relation Age of Onset   Heart attack Father        died @ 94   He indicated that his mother is deceased. He indicated that his father is deceased.   Social History    Social History   Socioeconomic History   Marital status: Married    Spouse name: Not on file   Number of children: Not on file   Years of education: Not on file   Highest education level: Not on file  Occupational History   Not on file  Social Needs   Financial resource strain: Not on file   Food insecurity:    Worry: Not on file    Inability: Not on file   Transportation needs:    Medical: Not on file    Non-medical: Not on file  Tobacco Use   Smoking status: Never Smoker   Smokeless tobacco: Never Used  Substance and Sexual Activity   Alcohol use: No   Drug use: No   Sexual activity: Not on file  Lifestyle   Physical activity:    Days per week: Not on file    Minutes per session: Not on file   Stress: Not on file  Relationships   Social connections:    Talks on phone: Not on file    Gets together: Not on file    Attends religious service: Not on file    Active member of club or organization: Not on file    Attends meetings of clubs or organizations: Not on file    Relationship status: Not on file   Intimate partner violence:    Fear of current or ex partner: Not on file    Emotionally abused: Not on file    Physically abused: Not on file    Forced sexual activity: Not on file  Other Topics Concern   Not on file  Social History  Narrative   Lives in [place] w/ his wife.  Active but does not routinely exercise.  Able to push-mow his yard.  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     Review of Systems    General:  No chills, fever, night sweats or weight changes.  Cardiovascular:  +++ chest pain, no dyspnea on exertion, edema, orthopnea, palpitations, paroxysmal nocturnal dyspnea. Dermatological: No rash, lesions/masses Respiratory: No cough, dyspnea Urologic: No hematuria, dysuria Abdominal:   No nausea, vomiting, diarrhea,  bright red blood per rectum, melena, or hematemesis Neurologic:  No visual changes, wkns, changes in mental status. All other systems reviewed and are otherwise negative except as noted above.  Physical Exam    Blood pressure (!) 144/79, pulse 78, temperature 97.7 F (36.5 C), temperature source Oral, resp. rate 15, height 6' (1.829 m), weight 81.6 kg, SpO2 95 %.  General: Pleasant, NAD Psych: Normal affect. Neuro: Alert and oriented X 3. Moves all extremities spontaneously. HEENT: Normal  Neck: Supple without bruits or JVD. Lungs:  Resp regular and unlabored, CTA. Heart: RRR no s3, s4, or murmurs. Abdomen: Soft, non-tender, non-distended, BS + x 4.  Extremities: No clubbing, cyanosis.  No RLE edema.  The left lower leg is larger in diam than the right but no pitting edema.  Mild erythema noted over the left lower leg. DP/PT/Radials 2+ and equal bilaterally.  Labs    Troponin  Recent Labs    10/18/18 0625  TROPONINI <0.03   Lab Results  Component Value Date   WBC 7.4 10/18/2018   HGB 17.1 (H) 10/18/2018   HCT 52.1 (H) 10/18/2018   MCV 96.3 10/18/2018   PLT 195 10/18/2018    Recent Labs  Lab 10/18/18 0625  NA 144  K 4.2  CL 103  CO2 26  BUN 18  CREATININE 1.21  CALCIUM 9.9  GLUCOSE 163*     Radiology Studies    Dg Chest 2 View  Result Date: 10/18/2018 CLINICAL DATA:  Chest pain EXAM: CHEST - 2 VIEW COMPARISON:  05/12/2014 FINDINGS: The heart size and mediastinal  contours are within normal limits. Both lungs are clear. The visualized skeletal structures are unremarkable. IMPRESSION: No active cardiopulmonary disease. Electronically Signed   By: Marlan Palau M.D.   On: 10/18/2018 07:02    ECG & Cardiac Imaging    RSR, 79, 2mm inferior ST elev w/ 1mm ST elev in V4-V6. 1mm ST dep in I and aVL - personally reviewed.  Assessment & Plan    1.  Acute inferior STEMI:  Pt awoke w/ sscp @ 4am and later presented to the ED w/ finding of intermittent inferior (subtle lateral) ST elevation coinciding w/ worsening of pain.  High suspicion for a winking RCA.  Emergent cath now.  The patient understands that risks include but are not limited to stroke (1 in 1000), death (1 in 1000), kidney failure [usually temporary] (1 in 500), bleeding (1 in 200), allergic reaction [possibly serious] (1 in 200), and agrees to proceed.  Plan for asa, brilinta,  blocker, and high potency statin post-cath.  F/u echo.  Eventual cardiac rehab (virtual).  2. Essential HTN:  On lisinopril @ home.  Add low dose  blocker in the setting of above.  Follow.  3.  Lipids: Adding high dose statin.  F/u lipids/lft's in am.  4.  Pre-diabetes:  A1c 6.6 in Dec.    Signed, Nicolasa Ducking, NP 10/18/2018, 9:42 AM  Agree with note by Ward Givens NP  Gordon Novak is a 84 year old relatively healthy Caucasian male with history of hypertension who presented early this morning with inferior STEMI.  The onset of chest pain was 4:00.  His EKG showed alternating normal findings with inferior ST segment elevation.  He was hemodynamically stable.  He was brought to the Cath Lab emergently for angiography and potential intervention.  His exam was benign.  His initial troponin was negative.  Runell Gess, M.D., FACP, [facility], [facility] [address] [place], [place]  [address]  [phone] 10/18/2018 10:39 AM

## 2018-10-18 NOTE — Progress Notes (Addendum)
52ml removed per prototol @ 1215. Bleeding noted. 75ml replaced, pressure held. Bleeding stopped. Will reassess, pt instructed again on importance of not using that arm. 1245: Noted hematoma developing despite pressure and additional air replaced. Pressure held again, reduced some. Area marked. +sensation, + circulation unchanged. Pt denies pain. Cath lab staff called and made aware. Will re start clock and not withdraw air for another two hours as able. CTM

## 2018-10-19 LAB — LIPID PANEL
Cholesterol: 138 mg/dL (ref 0–200)
HDL: 46 mg/dL (ref >40)
LDL Cholesterol: 66 mg/dL (ref 0–99)
Total CHOL/HDL Ratio: 3 RATIO
Triglycerides: 132 mg/dL (ref <150)
VLDL: 26 mg/dL (ref 0–40)

## 2018-10-19 LAB — BASIC METABOLIC PANEL
Anion gap: 8 (ref 5–15)
BUN: 17 mg/dL (ref 8–23)
CO2: 29 mmol/L (ref 22–32)
Calcium: 8.8 mg/dL — ABNORMAL LOW (ref 8.9–10.3)
Chloride: 105 mmol/L (ref 98–111)
Creatinine, Ser: 1.01 mg/dL (ref 0.61–1.24)
GFR calc Af Amer: >60 mL/min (ref >60)
GFR calc non Af Amer: >60 mL/min (ref >60)
Glucose, Bld: 113 mg/dL — ABNORMAL HIGH (ref 70–99)
Potassium: 3.7 mmol/L (ref 3.5–5.1)
Sodium: 142 mmol/L (ref 135–145)

## 2018-10-19 LAB — TROPONIN I
Troponin I: 0.93 ng/mL (ref <0.03)
Troponin I: 0.94 ng/mL (ref <0.03)
Troponin I: 0.84 ng/mL (ref <0.03)

## 2018-10-19 LAB — CBC
HCT: 44.7 % (ref 39.0–52.0)
Hemoglobin: 15 g/dL (ref 13.0–17.0)
MCH: 31.6 pg (ref 26.0–34.0)
MCHC: 33.6 g/dL (ref 30.0–36.0)
MCV: 94.3 fL (ref 80.0–100.0)
Platelets: 168 10*3/uL (ref 150–400)
RBC: 4.74 MIL/uL (ref 4.22–5.81)
RDW: 13.4 % (ref 11.5–15.5)
WBC: 7.9 10*3/uL (ref 4.0–10.5)
nRBC: 0 % (ref 0.0–0.2)

## 2018-10-19 NOTE — Care Management (Signed)
Brilinta benefits check sent and pending.  Aydien Majette RN, BSN, NCM-BC, ACM-RN [phone] 

## 2018-10-19 NOTE — Progress Notes (Addendum)
CRITICAL VALUE ALERT  Critical Value:  Troponin 0.93  Date & Time Notied:  10/19/2018 1100  Provider Notified: Lisabeth Devoid PA  Orders Received/Actions taken: expected result - no new orders

## 2018-10-19 NOTE — TOC Benefit Eligibility Note (Signed)
Transition of Care [facility]) Benefit Eligibility Note    Patient Details  Name: SIDDHANTH EVENER MRN: [id number] Date of Birth: Jan 24, 1935   Medication/Dose: BRILINTA 90 MG BID  Covered?: Yes  Tier: 2 Drug  Prescription Coverage Preferred Pharmacy: YES([facility]  )  Spoke with Person/Company/Phone Number:: DANA([employer]  #  [phone])  Co-Pay: $ 185.88  Prior Approval: No  Deductible: Unmet  Additional Notes: CLOPIDOGREL 75 MG BID(COVER -YES, CO-PAY- $2.92, TIER- 2 DRUG)    Mardene Sayer Phone Number: 10/19/2018, 3:24 PM

## 2018-10-19 NOTE — Progress Notes (Signed)
Progress Note  Patient Name: Gordon Novak Date of Encounter: 10/19/2018  Primary Cardiologist: Nanetta Batty, MD   Subjective   Postop day 1 inferior STEMI treated with PCI and drug-eluting stenting of the RCA.  Patient denies chest pain or shortness of breath.  Inpatient Medications    Scheduled Meds: . allopurinol  300 mg Oral q morning - 10a  . aspirin  81 mg Oral Daily  . aspirin EC  81 mg Oral Daily  . atorvastatin  80 mg Oral q1800  . bisacodyl  5 mg Oral Daily  . docusate sodium  100 mg Oral QHS  . furosemide  20 mg Oral q morning - 10a  . lisinopril  10 mg Oral q morning - 10a  . metoprolol tartrate  12.5 mg Oral BID  . mirabegron ER  50 mg Oral Daily  . prednisoLONE acetate  1 drop Both Eyes QHS  . sodium chloride flush  3 mL Intravenous Q12H  . tamsulosin  0.4 mg Oral q morning - 10a  . ticagrelor  90 mg Oral BID   Continuous Infusions: . sodium chloride     PRN Meds: sodium chloride, acetaminophen, acetaminophen, hydrocortisone, morphine injection, nitroGLYCERIN, ondansetron (ZOFRAN) IV, ondansetron (ZOFRAN) IV, oxyCODONE-acetaminophen, sodium chloride flush   Vital Signs    Vitals:   10/19/18 0200 10/19/18 0300 10/19/18 0400 10/19/18 0405  BP: (!) 96/46 (!) 115/54 (!) 90/44   Pulse: (!) 57 70 61   Resp: 10 18 14    Temp:   98 F (36.7 C)   TempSrc:   Oral   SpO2: 96% 94% 96%   Weight:    81.2 kg  Height:        Intake/Output Summary (Last 24 hours) at 10/19/2018 0747 Last data filed at 10/19/2018 0400 Gross per 24 hour  Intake 814.42 ml  Output 1375 ml  Net -560.58 ml   Last 3 Weights 10/19/2018 10/18/2018 06/02/2014  Weight (lbs) 179 lb 0.2 oz 180 lb 188 lb  Weight (kg) 81.2 kg 81.647 kg 85.276 kg      Telemetry    Normal sinus rhythm with PVCs- Personally Reviewed  ECG    Normal sinus rhythm at 72 without ST or T wave changes- Personally Reviewed  Physical Exam   GEN: No acute distress.   Neck: No JVD Cardiac: RRR, no murmurs,  rubs, or gallops.  Respiratory: Clear to auscultation bilaterally. GI: Soft, nontender, non-distended  MS: No edema; No deformity. Neuro:  Nonfocal  Psych: Normal affect  Extremities: Ecchymosis surrounding right radial puncture site  Labs    Chemistry Recent Labs  Lab 10/18/18 0625 10/19/18 0238  NA 144 142  K 4.2 3.7  CL 103 105  CO2 26 29  GLUCOSE 163* 113*  BUN 18 17  CREATININE 1.21 1.01  CALCIUM 9.9 8.8*  GFRNONAA 56* >60  GFRAA >60 >60  ANIONGAP 15 8     Hematology Recent Labs  Lab 10/18/18 0625 10/19/18 0238  WBC 7.4 7.9  RBC 5.41 4.74  HGB 17.1* 15.0  HCT 52.1* 44.7  MCV 96.3 94.3  MCH 31.6 31.6  MCHC 32.8 33.6  RDW 13.8 13.4  PLT 195 168    Cardiac Enzymes Recent Labs  Lab 10/18/18 0625  TROPONINI <0.03   No results for input(s): TROPIPOC in the last 168 hours.   BNPNo results for input(s): BNP, PROBNP in the last 168 hours.   DDimer No results for input(s): DDIMER in the last 168 hours.  Radiology    Dg Chest 2 View  Result Date: 10/18/2018 CLINICAL DATA:  Chest pain EXAM: CHEST - 2 VIEW COMPARISON:  05/12/2014 FINDINGS: The heart size and mediastinal contours are within normal limits. Both lungs are clear. The visualized skeletal structures are unremarkable. IMPRESSION: No active cardiopulmonary disease. Electronically Signed   By: Marlan Palauharles  Clark M.D.   On: 10/18/2018 07:02    Cardiac Studies   2D echocardiogram (10/18/2018)  IMPRESSIONS    1. The left ventricle has moderately reduced systolic function, with an ejection fraction of 35-40%. The cavity size was normal. Left ventricular diastolic Doppler parameters are consistent with impaired relaxation. Left ventricular diffuse hypokinesis.  2. The mitral valve is grossly normal.  3. The tricuspid valve is grossly normal.  4. The aortic valve is tricuspid. Mild thickening of the aortic valve. No stenosis of the aortic valve.  5. Moderate global reduction in LV systolic function;  mild diastolic dysfunction.  Cardiac catheterization (10/18/2018)  Conclusion     Mid RCA lesion is 100% stenosed.  A drug-eluting stent was successfully placed.  Post intervention, there is a 0% residual stenosis.       Patient Profile     84 y/o ? w/ a h/o HTN, left eye blindness, bladder outlet obstruction, nephrolithiasis, gout, and chronic left lower ext swelling.  He has no prior cardiac history.  [place]/ [place] in [place] and is relatively active, still push-mowing his lawn - which he did yesterday.  He was in his USOH until this AM, when he awoke @ ~ 4am with sscp.  This persisted and he eventually presented [facility] where ECG was initially nl.  He has worsening of pain and repeat ECG showed subtle inferolateral ST elevation.  A code STEMI was activated.  With sl ntg  IV ntg, he had initial improvement in c/p but at the time of my interview, he had worsening w/ recurrent and more pronounced inferior ST elevation.  He went to the Cath Lab yesterday for diagnostic coronary angiography and intervention of the RCA.   Assessment & Plan    1: Coronary artery disease- postop day #1 inferior STEMI treated with PCI and drug-eluting stenting of the mid to distal RCA with an excellent angiographic result.  The patient remains on aspirin and Brilinta.  Does have some mild ecchymosis surrounding his right radial puncture site.  He denies chest pain or shortness of breath.  He is stable for transfer out to telemetry with anticipation of discharge tomorrow.  2: Ischemic cardiomyopathy- 2D echo revealed EF of 35 to 40%.  It did not suggest that there was a wall motion abnormality.  We will appropriately adjust his medications including beta-blocker and ACE inhibitor blood pressure permitting.  He will need a repeat 2D echocardiogram in 3 months to assess return of LV function.  3: Essential hypertension- blood pressures controlled this morning on current medications  Mr. Gordon Novak  is stable this morning postop day 1 anterior STEMI treated with PCI and drug-eluting stenting.  He is on appropriate medications.  We will transfer out to telemetry for cardiac rehab and ambulation.  Anticipate discharge tomorrow.  For questions or updates, please contact [facility] Please consult [email or web address] for contact info under        Signed, Nanetta BattyJonathan Alantra Popoca, MD  10/19/2018, 7:47 AM

## 2018-10-20 ENCOUNTER — Encounter (HOSPITAL_COMMUNITY): Payer: Self-pay | Admitting: Cardiovascular Disease

## 2018-10-20 ENCOUNTER — Telehealth: Payer: Self-pay | Admitting: Cardiovascular Disease

## 2018-10-20 DIAGNOSIS — I5032 Chronic diastolic (congestive) heart failure: Secondary | ICD-10-CM

## 2018-10-20 DIAGNOSIS — I5022 Chronic systolic (congestive) heart failure: Secondary | ICD-10-CM

## 2018-10-20 DIAGNOSIS — E7800 Pure hypercholesterolemia, unspecified: Secondary | ICD-10-CM

## 2018-10-20 DIAGNOSIS — E785 Hyperlipidemia, unspecified: Secondary | ICD-10-CM

## 2018-10-20 LAB — POCT ACTIVATED CLOTTING TIME: Activated Clotting Time: 301 seconds

## 2018-10-20 MED ORDER — ATORVASTATIN CALCIUM 80 MG PO TABS: 80.0000 mg | ORAL_TABLET | Freq: Every day | ORAL | 2 refills | Status: DC

## 2018-10-20 MED ORDER — ASPIRIN 81 MG PO TBEC: 81.0000 mg | DELAYED_RELEASE_TABLET | Freq: Every day | ORAL | 2 refills | Status: DC

## 2018-10-20 MED ORDER — NITROGLYCERIN 0.4 MG SL SUBL: 0.4000 mg | SUBLINGUAL_TABLET | SUBLINGUAL | 2 refills | Status: AC | PRN

## 2018-10-20 MED ORDER — TICAGRELOR 90 MG PO TABS: 90.0000 mg | ORAL_TABLET | Freq: Two times a day (BID) | ORAL | 2 refills | Status: DC

## 2018-10-20 MED ORDER — METOPROLOL TARTRATE 25 MG PO TABS: 12.5000 mg | ORAL_TABLET | Freq: Two times a day (BID) | ORAL | 2 refills | Status: DC

## 2018-10-20 MED FILL — ATORVASTATIN CALCIUM 80 MG: 80 | 90 days supply | Qty: 90 | Fill #0

## 2018-10-20 MED FILL — ASPIRIN LOW DOSE 81 MG TBEC: 81 | 30 days supply | Qty: 30 | Fill #0

## 2018-10-20 MED FILL — METOPROLOL TARTRATE 25 MG T: 25 | 30 days supply | Qty: 30 | Fill #0

## 2018-10-20 MED FILL — BRILINTA 90 MG TABLET: 90 | 30 days supply | Qty: 60 | Fill #0

## 2018-10-20 MED FILL — NITROGLYCERIN 0.4 MG TAB SL: 0.4 | 8 days supply | Qty: 25 | Fill #0

## 2018-10-20 MED FILL — Nitroglycerin IV Soln 100 MCG/ML in D5W: INTRA_ARTERIAL | Qty: 10 | Status: AC

## 2018-10-20 NOTE — Telephone Encounter (Signed)
TOC appt per Laverda Page with Harriet Pho on 11/02/18 @ 9:15am

## 2018-10-20 NOTE — Progress Notes (Signed)
Cardiac Rehab Advisory  Cardiac Rehab is not seeing patients face to face due to the COVID 19 pandemic and restrictions. Patient are currently being ambulated by nursing staff and mobility team.  0830- 0930 Spoke  With Gordon Novak over the phone. Talked with the patient about the MI booklet. Stent Card. The importance of taking his brillinta twice a day. Reviewed exercise instructions, RPE scale end points of exercise. Gordon Krogstad says he does not think he will be interested in participating in phase 2 cardiac rehab at this time. Gordon Bick does not have a smart phone for use of the better hearts app. Discussed heart healthy diabetic diet information. Patient appreciative of the information. Will bring educational information to the patients nurse to give to the patient.Gladstone Lighter, RN,BSN 10/20/2018 9:27 AM

## 2018-10-20 NOTE — Discharge Summary (Signed)
Discharge Summary    Patient ID: Gordon Novak,  MRN: [id number], DOB/AGE: 09-10-1934 84 y.o.  Admit date: 10/18/2018 Discharge date: 10/20/2018  Primary Care Provider: Barbie Novak Primary Cardiologist: Dr. Allyson Novak   Discharge Diagnoses    Principal Problem:   Acute ST elevation myocardial infarction (STEMI) of inferior wall [facility]) Active Problems:   Acute ST elevation myocardial infarction (STEMI) of inferior wall involving right ventricle (HCC)   Hyperlipidemia   Acute systolic heart failure (HCC)   Allergies No Known Allergies  Diagnostic Studies/Procedures    TTE: 10/18/18  IMPRESSIONS    1. The left ventricle has moderately reduced systolic function, with an ejection fraction of 35-40%. The cavity size was normal. Left ventricular diastolic Doppler parameters are consistent with impaired relaxation. Left ventricular diffuse hypokinesis.  2. The mitral valve is grossly normal.  3. The tricuspid valve is grossly normal.  4. The aortic valve is tricuspid. Mild thickening of the aortic valve. No stenosis of the aortic valve.  5. Moderate global reduction in LV systolic function; mild diastolic dysfunction.  Cath: 10/18/18  IMPRESSION: Successful RCA PCI and drug-eluting stenting in the setting of inferior STEMI with no significant left system disease with a door to balloon time of 33 minutes.  The patient did receive Brilinta.  The procedure was done with heparin anticoagulation with an ACT in the 300 range.  He will need to be on dual antiplatelet therapy uninterrupted for 12 months.  In addition, he will need to be treated with guideline directed optimal medical therapy including high-dose statin and beta-blockade.  I did communicate the patient's condition to his daughter.  Gordon Novak. MD, [facility] 10/18/2018 10:30 AM _____________   History of Present Illness     84 y/o male w/ a h/o HTN, left eye blindness, bladder outlet obstruction, nephrolithiasis, gout,  and chronic left lower ext swelling.  He had no prior cardiac history.  He lives w/ his wife in [place] and is relatively active, still push-mowing his lawn - which he did the day prior to admission. He was in his USOH until the morning of admission, when he awoke @ ~ 4am with sscp.  This persisted and he eventually presented to [facility] ED where ECG was initially nl.  He had worsening of pain and repeat ECG showed subtle inferolateral ST elevation.  A code STEMI was activated.  With sl ntg  IV ntg, he had initial improvement in c/p but at the time of interview, he had worsening w/ recurrent and more pronounced inferior ST elevation.  He has been hemodynamically stable and was taken for cardiac cath.   Hospital Course     Cardiac cath noted above with successful PCI/DESx1 to the [facility]. Placed on DAPT with ASA/Brilinta for at least one year. Troponin peaked at 0.93. Follow up EF with 35-40% with LV hypokinesis. Added metoprolol and ACEi with blood pressures tolerating. LDL 66. On high dose statin. He was transferred from the ICU to telemetry on 10/19/18. Of note, review of medications noted Brilinta to be high cost. May need to switch to plavix after month of therapy if Brilinta remains high. Cardiac rehab phase I completed via phone 2/2 to COVID precautions.   General: Well developed, well nourished, male appearing in no acute distress. Head: Normocephalic, atraumatic.  Neck: Supple without bruits, JVD Lungs:  Resp regular and unlabored, CTA. Heart: RRR, S1, S2, no S3, S4, or murmur; no rub. Abdomen: Soft, non-tender, non-distended with normoactive bowel sounds.  Extremities: No clubbing, cyanosis, edema. Distal pedal pulses are 2+ bilaterally. Right radial cath site stable with ecchymosis, but no hematoma. Neuro: Alert and oriented X 3. Moves all extremities spontaneously. Psych: Normal affect.  Gordon Novak was seen by Gordon Novak and determined stable for discharge home. Follow up in the office  has been arranged. Medications are listed below.   _____________  Discharge Vitals Blood pressure (!) 113/56, pulse 87, temperature 97.6 F (36.4 C), temperature source Oral, resp. rate 17, height 6' (1.829 m), weight 81.2 kg, SpO2 96 %.  Filed Weights   10/18/18 0615 10/19/18 0405 10/20/18 0612  Weight: 81.6 kg 81.2 kg 81.2 kg    Labs & Radiologic Studies    CBC Recent Labs    10/18/18 0625 10/19/18 0238  WBC 7.4 7.9  HGB 17.1* 15.0  HCT 52.1* 44.7  MCV 96.3 94.3  PLT 195 168   Basic Metabolic Panel Recent Labs    16/02/9604/24/20 0625 10/19/18 0238  NA 144 142  K 4.2 3.7  CL 103 105  CO2 26 29  GLUCOSE 163* 113*  BUN 18 17  CREATININE 1.21 1.01  CALCIUM 9.9 8.8*   Liver Function Tests No results for input(s): AST, ALT, ALKPHOS, BILITOT, PROT, ALBUMIN in the last 72 hours. No results for input(s): LIPASE, AMYLASE in the last 72 hours. Cardiac Enzymes Recent Labs    10/19/18 0938 10/19/18 1417 10/19/18 1958  TROPONINI 0.93* 0.94* 0.84*   BNP Invalid input(s): POCBNP D-Dimer No results for input(s): DDIMER in the last 72 hours. Hemoglobin A1C No results for input(s): HGBA1C in the last 72 hours. Fasting Lipid Panel Recent Labs    10/19/18 0238  CHOL 138  HDL 46  LDLCALC 66  TRIG 132  CHOLHDL 3.0   Thyroid Function Tests No results for input(s): TSH, T4TOTAL, T3FREE, THYROIDAB in the last 72 hours.  Invalid input(s): FREET3 _____________  Dg Chest 2 View  Result Date: 10/18/2018 CLINICAL DATA:  Chest pain EXAM: CHEST - 2 VIEW COMPARISON:  05/12/2014 FINDINGS: The heart size and mediastinal contours are within normal limits. Both lungs are clear. The visualized skeletal structures are unremarkable. IMPRESSION: No active cardiopulmonary disease. Electronically Signed   By: Gordon Palauharles  Clark M.D.   On: 10/18/2018 07:02   Disposition   Pt is being discharged home today in good condition.  Follow-up Plans & Appointments    Follow-up Information     Gordon Novak Follow up on 11/02/2018.   Specialties:  Nurse Practitioner, Radiology, Cardiology Why:  at 9:15am for your follow up appt. This will be a telephone visit on your mobile phone.  Contact information: [address] [place] [id number] [phone]          Discharge Instructions    Amb Referral to Cardiac Rehabilitation   Complete by:  As directed    Diagnosis:   STEMI Coronary Stents     After initial evaluation and assessments completed: Virtual Based Care may be provided alone or in conjunction with Phase 2 Cardiac Rehab based on patient barriers.:  Yes   Call MD for:  redness, tenderness, or signs of infection (pain, swelling, redness, odor or green/yellow discharge around incision site)   Complete by:  As directed    Diet - low sodium heart healthy   Complete by:  As directed    Discharge instructions   Complete by:  As directed    Radial Site Care Refer to this sheet in the next few  weeks. These instructions provide you with information on caring for yourself after your procedure. Your caregiver may also give you more specific instructions. Your treatment has been planned according to current medical practices, but problems sometimes occur. Call your caregiver if you have any problems or questions after your procedure. HOME CARE INSTRUCTIONS You may shower the day after the procedure.Remove the bandage (dressing) and gently wash the site with plain soap and water.Gently pat the site dry.  Do not apply powder or lotion to the site.  Do not submerge the affected site in water for 3 to 5 days.  Inspect the site at least twice daily.  Do not flex or bend the affected arm for 24 hours.  No lifting over 5 pounds (2.3 kg) for 5 days after your procedure.  Do not drive home if you are discharged the same day of the procedure. Have someone else drive you.  You may drive 24 hours after the procedure unless otherwise instructed by your caregiver.   What to expect: Any bruising will usually fade within 1 to 2 weeks.  Blood that collects in the tissue (hematoma) may be painful to the touch. It should usually decrease in size and tenderness within 1 to 2 weeks.  SEEK IMMEDIATE MEDICAL CARE IF: You have unusual pain at the radial site.  You have redness, warmth, swelling, or pain at the radial site.  You have drainage (other than a small amount of blood on the dressing).  You have chills.  You have a fever or persistent symptoms for more than 72 hours.  You have a fever and your symptoms suddenly get worse.  Your arm becomes pale, cool, tingly, or numb.  You have heavy bleeding from the site. Hold pressure on the site.   PLEASE DO NOT MISS ANY DOSES OF YOUR BRILINTA!!!!! Also keep a log of you blood pressures and bring back to your follow up appt. Please call the office with any questions.   Patients taking blood thinners should generally stay away from medicines like ibuprofen, Advil, Motrin, naproxen, and Aleve due to risk of stomach bleeding. You may take Tylenol as directed or talk to your primary doctor about alternatives.   Increase activity slowly   Complete by:  As directed        Discharge Medications     Medication List    STOP taking these medications   aspirin 325 MG tablet Replaced by:  aspirin 81 MG EC tablet   diclofenac 75 MG EC tablet Commonly known as:  VOLTAREN   mirabegron ER 50 MG Tb24 tablet Commonly known as:  MYRBETRIQ     TAKE these medications   allopurinol 300 MG tablet Commonly known as:  ZYLOPRIM Take 300 mg by mouth every morning.   aspirin 81 MG EC tablet Take 1 tablet (81 mg total) by mouth daily. Replaces:  aspirin 325 MG tablet   atorvastatin 80 MG tablet Commonly known as:  LIPITOR Take 1 tablet (80 mg total) by mouth daily at 6 PM.   beta carotene w/minerals tablet Take 1 tablet by mouth every morning.   bisacodyl 5 MG EC tablet Commonly known as:  DULCOLAX Take 1 tablet  (5 mg total) by mouth daily.   DSS 100 MG Caps Take 100 mg by mouth 2 (two) times daily. What changed:  when to take this   furosemide 20 MG tablet Commonly known as:  LASIX Take 20 mg by mouth every morning.   lisinopril 10 MG tablet  Commonly known as:  ZESTRIL Take 10 mg by mouth every morning.   metFORMIN 500 MG 24 hr tablet Commonly known as:  GLUCOPHAGE-XR Take 500 mg by mouth daily with breakfast.   metoprolol tartrate 25 MG tablet Commonly known as:  LOPRESSOR Take 0.5 tablets (12.5 mg total) by mouth 2 (two) times daily.   multivitamin with minerals Tabs tablet Take 1 tablet by mouth every evening.   nitroGLYCERIN 0.4 MG SL tablet Commonly known as:  NITROSTAT Place 1 tablet (0.4 mg total) under the tongue every 5 (five) minutes x 3 doses as needed for chest pain.   oxyCODONE-acetaminophen 5-325 MG tablet Commonly known as:  PERCOCET/ROXICET Take 2 tablets by mouth every 6 (six) hours as needed for severe pain.   potassium chloride SA 20 MEQ tablet Commonly known as:  K-DUR Take 20 mEq by mouth as directed.   prednisoLONE acetate 1 % ophthalmic suspension Commonly known as:  PRED FORTE Place 1 drop into both eyes at bedtime.   Proctofoam HC rectal foam Generic drug:  hydrocortisone-pramoxine Place 1 applicator rectally 3 (three) times daily as needed for hemorrhoids or itching.   tamsulosin 0.4 MG Caps capsule Commonly known as:  FLOMAX Take 2 capsules (0.8 mg total) by mouth daily. What changed:    how much to take  when to take this   ticagrelor 90 MG Tabs tablet Commonly known as:  BRILINTA Take 1 tablet (90 mg total) by mouth 2 (two) times daily.   valACYclovir 500 MG tablet Commonly known as:  VALTREX Take 500 mg by mouth 2 (two) times daily as needed (cold sores).        Acute coronary syndrome (MI, NSTEMI, STEMI, etc) this admission?: Yes.     AHA/ACC Clinical Performance & Quality Measures: 1. Aspirin prescribed? - Yes 2. ADP  Receptor Inhibitor (Plavix/Clopidogrel, Brilinta/Ticagrelor or Effient/Prasugrel) prescribed (includes medically managed patients)? - Yes 3. Beta Blocker prescribed? - Yes 4. High Intensity Statin (Lipitor 40-80mg  or Crestor 20-40mg ) prescribed? - Yes 5. EF assessed during THIS hospitalization? - Yes 6. For EF <40%, was ACEI/ARB prescribed? - Yes 7. For EF <40%, Aldosterone Antagonist (Spironolactone or Eplerenone) prescribed? - No - Reason:  consider as outpatient if blood pressure tolerates 8. Cardiac Rehab Phase II ordered (Included Medically managed Patients)? - Yes    Outstanding Labs/Studies   FLP/LFTs in 8 weeks if tolerating statin. Follow up echo in 3 months to reassess EF.   Duration of Discharge Encounter   Greater than 30 minutes including physician time.  Signed, Laverda Page Novak-C 10/20/2018, 11:25 AM

## 2018-10-20 NOTE — Discharge Instructions (Signed)
Coronary Angiogram With Stent, Care After  This sheet gives you information about how to care for yourself after your procedure. Your health care provider may also give you more specific instructions. If you have problems or questions, contact your health care provider.  What can I expect after the procedure?  After your procedure, it is common to have:   Bruising in the area where a small, thin tube (catheter) was inserted. This usually fades within 1-2 weeks.   Blood collecting in the tissue (hematoma) that may be painful to the touch. It should usually decrease in size and tenderness within 1-2 weeks.  Follow these instructions at home:  Insertion area care   Do not take baths, swim, or use a hot tub until your health care provider approves.   You may shower 24-48 hours after the procedure or as directed by your health care provider.   Follow instructions from your health care provider about how to take care of your incision. Make sure you:  ? Wash your hands with soap and water before you change your bandage (dressing). If soap and water are not available, use hand sanitizer.  ? Change your dressing as told by your health care provider.  ? Leave stitches (sutures), skin glue, or adhesive strips in place. These skin closures may need to stay in place for 2 weeks or longer. If adhesive strip edges start to loosen and curl up, you may trim the loose edges. Do not remove adhesive strips completely unless your health care provider tells you to do that.   Remove the bandage (dressing) and gently wash the catheter insertion site with plain soap and water.   Pat the area dry with a clean towel. Do not rub the area, because that may cause bleeding.   Do not apply powder or lotion to the incision area.   Check your incision area every day for signs of infection. Check for:  ? More redness, swelling, or pain.  ? More fluid or blood.  ? Warmth.  ? Pus or a bad smell.  Activity   Do not drive for 24 hours if you  were given a medicine to help you relax (sedative).   Do not lift anything that is heavier than 10 lb (4.5 kg) for 5 days after your procedure or as directed by your health care provider.   Ask your health care provider when it is okay for you:  ? To return to work or school.  ? To resume usual physical activities or sports.  ? To resume sexual activity.  Eating and drinking     Eat a heart-healthy diet. This should include plenty of fresh fruits and vegetables.   Avoid the following types of food:  ? Food that is high in salt.  ? Canned or highly processed food.  ? Food that is high in saturated fat or sugar.  ? Fried food.   Limit alcohol intake to no more than 1 drink a day for non-pregnant women and 2 drinks a day for men. One drink equals 12 oz of beer, 5 oz of wine, or 1 oz of hard liquor.  Lifestyle     Do not use any products that contain nicotine or tobacco, such as cigarettes and e-cigarettes. If you need help quitting, ask your health care provider.   Take steps to manage and control your weight.   Get regular exercise.   Manage your blood pressure.   Manage other health problems, such as diabetes.    General instructions   Take over-the-counter and prescription medicines only as told by your health care provider. Blood thinners may be prescribed after your procedure to improve blood flow through the stent.   If you need an MRI after your heart stent has been placed, be sure to tell the health care provider who orders the MRI that you have a heart stent.   Keep all follow-up visits as directed by your health care provider. This is important.  Contact a health care provider if:   You have a fever.   You have chills.   You have increased bleeding from the catheter insertion area. Hold pressure on the area.  Get help right away if:   You develop chest pain or shortness of breath.   You feel faint or you pass out.   You have unusual pain at the catheter insertion area.   You have redness,  warmth, or swelling at the catheter insertion area.   You have drainage (other than a small amount of blood on the dressing) from the catheter insertion area.   The catheter insertion area is bleeding, and the bleeding does not stop after 30 minutes of holding steady pressure on the area.   You develop bleeding from any other place, such as from your rectum. There may be bright red blood in your urine or stool, or it may appear as black, tarry stool.  This information is not intended to replace advice given to you by your health care provider. Make sure you discuss any questions you have with your health care provider.  Document Released: 11/30/2004 Document Revised: 02/08/2016 Document Reviewed: 02/08/2016  Elsevier Interactive Patient Education  2019 [employer]

## 2018-10-20 NOTE — Telephone Encounter (Signed)
Pt currently admitted.

## 2018-10-21 NOTE — Telephone Encounter (Signed)
Patient contacted regarding discharge from [facility] on 10/20/2018.  Patient understands to follow up with provider lawrence on 11-02-2018 at 9:15 am virtual telephone visit. Patient understands discharge instructions? yes Patient understands medications and regiment? yes Patient understands to bring all medications to this visit? yes

## 2018-10-22 ENCOUNTER — Emergency Department (HOSPITAL_COMMUNITY): Payer: Medicare Other

## 2018-10-22 ENCOUNTER — Observation Stay (HOSPITAL_BASED_OUTPATIENT_CLINIC_OR_DEPARTMENT_OTHER): Payer: Medicare Other

## 2018-10-22 ENCOUNTER — Other Ambulatory Visit: Payer: Self-pay

## 2018-10-22 ENCOUNTER — Observation Stay (HOSPITAL_COMMUNITY)
Admission: EM | Admit: 2018-10-22 | Discharge: 2018-10-23 | Disposition: A | Discharge status: Home / Self Care | Payer: Medicare Other | Attending: Internal Medicine | Admitting: Internal Medicine

## 2018-10-22 ENCOUNTER — Encounter (HOSPITAL_COMMUNITY): Payer: Self-pay | Admitting: Emergency Medicine

## 2018-10-22 DIAGNOSIS — E785 Hyperlipidemia, unspecified: Secondary | ICD-10-CM | POA: Insufficient documentation

## 2018-10-22 DIAGNOSIS — I25119 Atherosclerotic heart disease of native coronary artery with unspecified angina pectoris: Secondary | ICD-10-CM | POA: Diagnosis present

## 2018-10-22 DIAGNOSIS — Z7902 Long term (current) use of antithrombotics/antiplatelets: Secondary | ICD-10-CM | POA: Diagnosis not present

## 2018-10-22 DIAGNOSIS — I2699 Other pulmonary embolism without acute cor pulmonale: Secondary | ICD-10-CM | POA: Diagnosis not present

## 2018-10-22 DIAGNOSIS — I252 Old myocardial infarction: Secondary | ICD-10-CM | POA: Diagnosis not present

## 2018-10-22 DIAGNOSIS — I2510 Atherosclerotic heart disease of native coronary artery without angina pectoris: Secondary | ICD-10-CM | POA: Diagnosis not present

## 2018-10-22 DIAGNOSIS — M109 Gout, unspecified: Secondary | ICD-10-CM | POA: Insufficient documentation

## 2018-10-22 DIAGNOSIS — Z955 Presence of coronary angioplasty implant and graft: Secondary | ICD-10-CM | POA: Diagnosis not present

## 2018-10-22 DIAGNOSIS — Z7982 Long term (current) use of aspirin: Secondary | ICD-10-CM | POA: Diagnosis not present

## 2018-10-22 DIAGNOSIS — E1159 Type 2 diabetes mellitus with other circulatory complications: Secondary | ICD-10-CM | POA: Diagnosis not present

## 2018-10-22 DIAGNOSIS — Z8249 Family history of ischemic heart disease and other diseases of the circulatory system: Secondary | ICD-10-CM | POA: Diagnosis not present

## 2018-10-22 DIAGNOSIS — R079 Chest pain, unspecified: Secondary | ICD-10-CM

## 2018-10-22 DIAGNOSIS — I2694 Multiple subsegmental pulmonary emboli without acute cor pulmonale: Principal | ICD-10-CM | POA: Insufficient documentation

## 2018-10-22 DIAGNOSIS — Z9861 Coronary angioplasty status: Secondary | ICD-10-CM

## 2018-10-22 DIAGNOSIS — E119 Type 2 diabetes mellitus without complications: Secondary | ICD-10-CM | POA: Diagnosis not present

## 2018-10-22 DIAGNOSIS — I110 Hypertensive heart disease with heart failure: Secondary | ICD-10-CM | POA: Diagnosis not present

## 2018-10-22 DIAGNOSIS — H5462 Unqualified visual loss, left eye, normal vision right eye: Secondary | ICD-10-CM | POA: Diagnosis not present

## 2018-10-22 DIAGNOSIS — Z86711 Personal history of pulmonary embolism: Secondary | ICD-10-CM | POA: Diagnosis present

## 2018-10-22 DIAGNOSIS — Z79899 Other long term (current) drug therapy: Secondary | ICD-10-CM | POA: Insufficient documentation

## 2018-10-22 DIAGNOSIS — I5022 Chronic systolic (congestive) heart failure: Secondary | ICD-10-CM | POA: Insufficient documentation

## 2018-10-22 DIAGNOSIS — Z1159 Encounter for screening for other viral diseases: Secondary | ICD-10-CM | POA: Insufficient documentation

## 2018-10-22 DIAGNOSIS — Z7984 Long term (current) use of oral hypoglycemic drugs: Secondary | ICD-10-CM | POA: Insufficient documentation

## 2018-10-22 DIAGNOSIS — M1000 Idiopathic gout, unspecified site: Secondary | ICD-10-CM

## 2018-10-22 HISTORY — DX: Atherosclerotic heart disease of native coronary artery without angina pectoris: I25.10

## 2018-10-22 HISTORY — DX: Coronary angioplasty status: Z98.61

## 2018-10-22 HISTORY — DX: Other pulmonary embolism without acute cor pulmonale: I26.99

## 2018-10-22 HISTORY — DX: Type 2 diabetes mellitus without complications: E11.9

## 2018-10-22 LAB — HEPATIC FUNCTION PANEL
ALT: 29 U/L (ref 0–44)
AST: 29 U/L (ref 15–41)
Albumin: 3.1 g/dL — ABNORMAL LOW (ref 3.5–5.0)
Alkaline Phosphatase: 97 U/L (ref 38–126)
Bilirubin, Direct: 0.3 mg/dL — ABNORMAL HIGH (ref 0.0–0.2)
Indirect Bilirubin: 0.8 mg/dL (ref 0.3–0.9)
Total Bilirubin: 1.1 mg/dL (ref 0.3–1.2)
Total Protein: 5.5 g/dL — ABNORMAL LOW (ref 6.5–8.1)

## 2018-10-22 LAB — GLUCOSE, CAPILLARY
Glucose-Capillary: 187 mg/dL — ABNORMAL HIGH (ref 70–99)
Glucose-Capillary: 108 mg/dL — ABNORMAL HIGH (ref 70–99)
Glucose-Capillary: 128 mg/dL — ABNORMAL HIGH (ref 70–99)

## 2018-10-22 LAB — COMPREHENSIVE METABOLIC PANEL
ALT: 31 U/L (ref 0–44)
AST: 34 U/L (ref 15–41)
Albumin: 3.3 g/dL — ABNORMAL LOW (ref 3.5–5.0)
Alkaline Phosphatase: 101 U/L (ref 38–126)
Anion gap: 12 (ref 5–15)
BUN: 27 mg/dL — ABNORMAL HIGH (ref 8–23)
CO2: 23 mmol/L (ref 22–32)
Calcium: 8.9 mg/dL (ref 8.9–10.3)
Chloride: 104 mmol/L (ref 98–111)
Creatinine, Ser: 1.1 mg/dL (ref 0.61–1.24)
GFR calc Af Amer: >60 mL/min (ref >60)
GFR calc non Af Amer: >60 mL/min (ref >60)
Glucose, Bld: 133 mg/dL — ABNORMAL HIGH (ref 70–99)
Potassium: 4 mmol/L (ref 3.5–5.1)
Sodium: 139 mmol/L (ref 135–145)
Total Bilirubin: 1.2 mg/dL (ref 0.3–1.2)
Total Protein: 5.8 g/dL — ABNORMAL LOW (ref 6.5–8.1)

## 2018-10-22 LAB — TROPONIN I
Troponin I: 0.2 ng/mL (ref <0.03)
Troponin I: 0.17 ng/mL (ref <0.03)
Troponin I: 0.14 ng/mL (ref <0.03)
Troponin I: 0.13 ng/mL (ref <0.03)

## 2018-10-22 LAB — D-DIMER, QUANTITATIVE: D-Dimer, Quant: 2.57 ug/mL-FEU — ABNORMAL HIGH (ref 0.00–0.50)

## 2018-10-22 LAB — HEPARIN LEVEL (UNFRACTIONATED): Heparin Unfractionated: 0.63 IU/mL (ref 0.30–0.70)

## 2018-10-22 LAB — SARS CORONAVIRUS 2 BY RT PCR (HOSPITAL ORDER, PERFORMED IN ~~LOC~~ HOSPITAL LAB): SARS Coronavirus 2: NEGATIVE

## 2018-10-22 LAB — CBC
HCT: 44.2 % (ref 39.0–52.0)
Hemoglobin: 14.9 g/dL (ref 13.0–17.0)
MCH: 31.8 pg (ref 26.0–34.0)
MCHC: 33.7 g/dL (ref 30.0–36.0)
MCV: 94.2 fL (ref 80.0–100.0)
Platelets: 191 10*3/uL (ref 150–400)
RBC: 4.69 MIL/uL (ref 4.22–5.81)
RDW: 13.5 % (ref 11.5–15.5)
WBC: 9.2 10*3/uL (ref 4.0–10.5)
nRBC: 0 % (ref 0.0–0.2)

## 2018-10-22 LAB — BRAIN NATRIURETIC PEPTIDE: B Natriuretic Peptide: 24.6 pg/mL (ref 0.0–100.0)

## 2018-10-22 MED ORDER — TAMSULOSIN HCL 0.4 MG PO CAPS
0.4000 mg | ORAL_CAPSULE | Freq: Every day | ORAL | Status: DC
  Administered 2018-10-22 – 2018-10-23 (×2): 0.4 mg via ORAL
  Filled 2018-10-22 (×2): qty 1

## 2018-10-22 MED ORDER — METOPROLOL TARTRATE 12.5 MG HALF TABLET
12.5000 mg | ORAL_TABLET | Freq: Two times a day (BID) | ORAL | Status: DC
  Administered 2018-10-22 – 2018-10-23 (×3): 12.5 mg via ORAL
  Filled 2018-10-22 (×3): qty 1

## 2018-10-22 MED ORDER — ATORVASTATIN CALCIUM 80 MG PO TABS
80.0000 mg | ORAL_TABLET | Freq: Every day | ORAL | Status: DC
  Administered 2018-10-22: 80 mg via ORAL
  Filled 2018-10-22: qty 1

## 2018-10-22 MED ORDER — TICAGRELOR 90 MG PO TABS
90.0000 mg | ORAL_TABLET | Freq: Two times a day (BID) | ORAL | Status: DC
  Administered 2018-10-22 – 2018-10-23 (×3): 90 mg via ORAL
  Filled 2018-10-22 (×3): qty 1

## 2018-10-22 MED ORDER — HEPARIN BOLUS VIA INFUSION
5000.0000 [IU] | Freq: Once | INTRAVENOUS | Status: AC
  Administered 2018-10-22: 5000 [IU] via INTRAVENOUS
  Filled 2018-10-22: qty 5000

## 2018-10-22 MED ORDER — BISACODYL 5 MG PO TBEC
5.0000 mg | DELAYED_RELEASE_TABLET | Freq: Every day | ORAL | Status: DC
  Filled 2018-10-22: qty 1

## 2018-10-22 MED ORDER — PROSIGHT PO TABS
1.0000 | ORAL_TABLET | Freq: Every day | ORAL | Status: DC
  Administered 2018-10-22 – 2018-10-23 (×2): 1 via ORAL
  Filled 2018-10-22 (×2): qty 1

## 2018-10-22 MED ORDER — INSULIN ASPART 100 UNIT/ML ~~LOC~~ SOLN
0.0000 [IU] | Freq: Three times a day (TID) | SUBCUTANEOUS | Status: DC
  Administered 2018-10-22: 2 [IU] via SUBCUTANEOUS

## 2018-10-22 MED ORDER — POTASSIUM CHLORIDE CRYS ER 20 MEQ PO TBCR
20.0000 meq | EXTENDED_RELEASE_TABLET | Freq: Every day | ORAL | Status: DC
  Administered 2018-10-22 – 2018-10-23 (×2): 20 meq via ORAL
  Filled 2018-10-22 (×2): qty 1

## 2018-10-22 MED ORDER — PREDNISOLONE ACETATE 1 % OP SUSP
1.0000 [drp] | Freq: Every day | OPHTHALMIC | Status: DC
  Administered 2018-10-22: 1 [drp] via OPHTHALMIC
  Filled 2018-10-22: qty 5

## 2018-10-22 MED ORDER — IOHEXOL 350 MG/ML SOLN
80.0000 mL | Freq: Once | INTRAVENOUS | Status: AC | PRN
  Administered 2018-10-22: 80 mL via INTRAVENOUS

## 2018-10-22 MED ORDER — ALLOPURINOL 300 MG PO TABS
300.0000 mg | ORAL_TABLET | Freq: Every day | ORAL | Status: DC
  Administered 2018-10-22 – 2018-10-23 (×2): 300 mg via ORAL
  Filled 2018-10-22 (×2): qty 1

## 2018-10-22 MED ORDER — NITROGLYCERIN 0.4 MG SL SUBL: 0.4000 mg | SUBLINGUAL_TABLET | SUBLINGUAL | Status: DC | PRN

## 2018-10-22 MED ORDER — ONDANSETRON HCL 4 MG PO TABS: 4.0000 mg | ORAL_TABLET | Freq: Four times a day (QID) | ORAL | Status: DC | PRN

## 2018-10-22 MED ORDER — ASPIRIN EC 81 MG PO TBEC
81.0000 mg | DELAYED_RELEASE_TABLET | Freq: Every day | ORAL | Status: DC
  Administered 2018-10-22 – 2018-10-23 (×2): 81 mg via ORAL
  Filled 2018-10-22 (×2): qty 1

## 2018-10-22 MED ORDER — ACETAMINOPHEN 325 MG PO TABS: 650.0000 mg | ORAL_TABLET | Freq: Four times a day (QID) | ORAL | Status: DC | PRN

## 2018-10-22 MED ORDER — IOHEXOL 300 MG/ML  SOLN: 80.0000 mL | Freq: Once | INTRAMUSCULAR | Status: DC | PRN

## 2018-10-22 MED ORDER — ACETAMINOPHEN 650 MG RE SUPP: 650.0000 mg | Freq: Four times a day (QID) | RECTAL | Status: DC | PRN

## 2018-10-22 MED ORDER — FUROSEMIDE 20 MG PO TABS
20.0000 mg | ORAL_TABLET | Freq: Every day | ORAL | Status: DC
  Administered 2018-10-22 – 2018-10-23 (×2): 20 mg via ORAL
  Filled 2018-10-22 (×2): qty 1

## 2018-10-22 MED ORDER — HEPARIN (PORCINE) 25000 UT/250ML-% IV SOLN
1250.0000 [IU]/h | INTRAVENOUS | Status: DC
  Administered 2018-10-22 (×2): 1450 [IU]/h via INTRAVENOUS
  Filled 2018-10-22 (×2): qty 250

## 2018-10-22 MED ORDER — LISINOPRIL 10 MG PO TABS
10.0000 mg | ORAL_TABLET | Freq: Every day | ORAL | Status: DC
  Administered 2018-10-22 – 2018-10-23 (×2): 10 mg via ORAL
  Filled 2018-10-22 (×2): qty 1

## 2018-10-22 MED ORDER — DOCUSATE SODIUM 50 MG PO CAPS
100.0000 mg | ORAL_CAPSULE | Freq: Every day | ORAL | Status: DC
  Administered 2018-10-22: 100 mg via ORAL
  Filled 2018-10-22 (×2): qty 2

## 2018-10-22 MED ORDER — ADULT MULTIVITAMIN W/MINERALS CH
1.0000 | ORAL_TABLET | Freq: Every evening | ORAL | Status: DC
  Administered 2018-10-22: 1 via ORAL
  Filled 2018-10-22: qty 1

## 2018-10-22 MED ORDER — OXYCODONE-ACETAMINOPHEN 5-325 MG PO TABS: 2.0000 | ORAL_TABLET | Freq: Four times a day (QID) | ORAL | Status: DC | PRN

## 2018-10-22 MED ORDER — IIOPAMIDOL (ISOVUE-250) INJECTION 51%: 100.0000 mL | Freq: Once | INTRAVENOUS | Status: DC | PRN

## 2018-10-22 MED ORDER — ONDANSETRON HCL 4 MG/2ML IJ SOLN: 4.0000 mg | Freq: Four times a day (QID) | INTRAMUSCULAR | Status: DC | PRN

## 2018-10-22 NOTE — Progress Notes (Signed)
ANTICOAGULATION CONSULT NOTE  Pharmacy Consult for heparin Indication: pulmonary embolus  No Known Allergies  Patient Measurements: Height: 6' (182.9 cm) Weight: 180 lb 4.8 oz (81.8 kg) IBW/kg (Calculated) : 77.6 Heparin Dosing Weight: 81.2 kg  Vital Signs: Temp: 98.1 F (36.7 C) (05/28 0635) Temp Source: Oral (05/28 0635) BP: 133/54 (05/28 0821) Pulse Rate: 74 (05/28 0827)  Labs: Recent Labs    10/22/18 0117 10/22/18 0717 10/22/18 1235  HGB 14.9  --   --   HCT 44.2  --   --   PLT 191  --   --   HEPARINUNFRC  --   --  0.63  CREATININE 1.10  --   --   TROPONINI 0.20* 0.17* 0.14*    Estimated Creatinine Clearance: 57.8 mL/min (by C-G formula based on SCr of 1.1 mg/dL).   Assessment: 84 yo man to start heparin for PE.  He was not on anticoagulation PTA Heparin level therapeutic  Goal of Therapy:  Heparin level 0.3-0.7 units/ml Monitor platelets by anticoagulation protocol: Yes   Plan:  Continue heparin at 1450 units / hr Daily heparin level, CBC  Thank you Okey Regal, PharmD [phone] Elwin Sleight 10/22/2018,1:44 PM

## 2018-10-22 NOTE — Progress Notes (Signed)
ANTICOAGULATION CONSULT NOTE - Initial Consult  Pharmacy Consult for heparin Indication: pulmonary embolus  No Known Allergies  Patient Measurements: Height: 6' (182.9 cm) Weight: 179 lb (81.2 kg) IBW/kg (Calculated) : 77.6 Heparin Dosing Weight: 81.2 kg  Vital Signs: Temp: 97.8 F (36.6 C) (05/28 0043) Temp Source: Oral (05/28 0043) BP: 117/76 (05/28 0345) Pulse Rate: 81 (05/28 0345)  Labs: Recent Labs    10/19/18 1417 10/19/18 1958 10/22/18 0117  HGB  --   --  14.9  HCT  --   --  44.2  PLT  --   --  191  CREATININE  --   --  1.10  TROPONINI 0.94* 0.84* 0.20*    Estimated Creatinine Clearance: 57.8 mL/min (by C-G formula based on SCr of 1.1 mg/dL).   Medical History: Past Medical History:  Diagnosis Date  . Acute systolic heart failure (HCC)    35-40%  . Bladder outlet obstruction   . Blindness of left eye   . Chronic Left leg swelling    a. Since 2007.  Marland Kitchen Essential hypertension   . Gout   . Hemorrhoids 05/12/2014  . Nephrolithiasis   . Pre-diabetes   . STEMI (ST elevation myocardial infarction) (HCC)    10/18/18 PCI/DES x1 to the mRCA    Medications:  See medication history  Assessment: 84 yo man to start heparin for PE.  He was not on anticoagulation PTA Goal of Therapy:  Heparin level 0.3-0.7 units/ml Monitor platelets by anticoagulation protocol: Yes   Plan:  Heparin 5000 unit bolus and drip at 1450 units/hr Check heparin level 8 hours after start Monitor for bleeding complications  Kayceon Oki Poteet 10/22/2018,5:26 AM

## 2018-10-22 NOTE — ED Provider Notes (Signed)
[facility]ENC[facility] Provider Note  CSN: [id number] Arrival date & time: 10/22/18 [id number]  Chief Complaint(s) Chest Pain (Right side)  HPI Gordon Novak is a 84 y.o. male past medical history listed below including recent inferior STEMI status post stenting who presents today with pleuritic chest pain.  Pain only felt with very deep breaths.  Located in right side of the chest.  Denies any associated shortness of breath.  No exertional chest pain.  No nausea or vomiting.  No cough or congestion.  No fevers or chills.  Patient endorses chronic left lower extremity edema.  States that he has been worked up for prior blood clots which have been negative.  Currently not anticoagulated.  HPI  Past Medical History Past Medical History:  Diagnosis Date  . Acute systolic heart failure (HCC)    35-40%  . Bladder outlet obstruction   . Blindness of left eye   . Chronic Left leg swelling    a. Since 2007.  Marland Kitchen. Essential hypertension   . Gout   . Hemorrhoids 05/12/2014  . Nephrolithiasis   . Pre-diabetes   . STEMI (ST elevation myocardial infarction) (HCC)    [facility]18 [facility]   Patient Active Problem List   Diagnosis Date Noted  . Pulmonary embolism (HCC) 10/22/2018  . Hyperlipidemia 10/20/2018  . Acute systolic heart failure (HCC) 10/20/2018  . Acute ST elevation myocardial infarction (STEMI) of inferior wall (HCC) 10/18/2018  . Acute ST elevation myocardial infarction (STEMI) of inferior wall involving right ventricle (HCC) 10/18/2018  . Proctitis 05/14/2014  . Urinary tract infection 05/12/2014  . SIRS (systemic inflammatory response syndrome) (HCC) 05/12/2014  . Leukocytosis 05/12/2014  . Tachycardia 05/12/2014  . Constipation 05/12/2014  . Hemorrhoids 05/12/2014  . Gout 05/12/2014  . Urinary tract infectious disease   . Nephrolithiasis 05/07/2014   Home Medication(s) Prior to Admission medications   Medication Sig Start Date End Date  Taking? Authorizing Provider  allopurinol (ZYLOPRIM) 300 MG tablet Take 300 mg by mouth daily.  02/15/14  Yes [provider]  aspirin EC 81 MG EC tablet Take 1 tablet (81 mg total) by mouth daily. 10/20/18  Yes Arty Baumgartneroberts, Lindsay B, NP  atorvastatin (LIPITOR) 80 MG tablet Take 1 tablet (80 mg total) by mouth daily at 6 PM. 10/20/18  Yes Laverda Pageoberts, Lindsay B, NP  beta carotene w/minerals (OCUVITE) tablet Take 1 tablet by mouth daily.    Yes [provider]  bisacodyl (DULCOLAX) 5 MG EC tablet Take 1 tablet (5 mg total) by mouth daily. 05/14/14  Yes TatOnalee Hua, David, MD  docusate sodium 100 MG CAPS Take 100 mg by mouth 2 (two) times daily. Patient taking differently: Take 100 mg by mouth at bedtime.  05/14/14  Yes Tat, Onalee Huaavid, MD  furosemide (LASIX) 20 MG tablet Take 20 mg by mouth daily.  02/15/14  Yes [provider]  lisinopril (PRINIVIL,ZESTRIL) 10 MG tablet Take 10 mg by mouth daily.  02/15/14  Yes [provider]  metFORMIN (GLUCOPHAGE-XR) 500 MG 24 hr tablet Take 500 mg by mouth daily with breakfast. 10/07/18  Yes [provider]  metoprolol tartrate (LOPRESSOR) 25 MG tablet Take 0.5 tablets (12.5 mg total) by mouth 2 (two) times daily. 10/20/18  Yes Arty Baumgartneroberts, Lindsay B, NP  Multiple Vitamin (MULTIVITAMIN WITH MINERALS) TABS tablet Take 1 tablet by mouth every evening.    Yes [provider]  nitroGLYCERIN (NITROSTAT) 0.4 MG SL tablet Place 1 tablet (0.4 mg total) under  the tongue every 5 (five) minutes x 3 doses as needed for chest pain. 10/20/18  Yes Arty Baumgartner, NP  oxyCODONE-acetaminophen (PERCOCET/ROXICET) 5-325 MG per tablet Take 2 tablets by mouth every 6 (six) hours as needed for severe pain.  05/05/14  Yes [provider]  potassium chloride SA (K-DUR) 20 MEQ tablet Take 20 mEq by mouth daily.  12/18/17  Yes [provider]  prednisoLONE acetate (PRED FORTE) 1 % ophthalmic suspension Place 1 drop into both eyes at bedtime.   05/09/14  Yes [provider]  PROCTOFOAM HC rectal foam Place 1 applicator rectally 3 (three) times daily as needed for hemorrhoids or itching.  05/16/14  Yes [provider]  tamsulosin (FLOMAX) 0.4 MG CAPS capsule Take 2 capsules (0.8 mg total) by mouth daily. Patient taking differently: Take 0.4 mg by mouth daily.  05/08/14  Yes Crist Fat, MD  ticagrelor (BRILINTA) 90 MG TABS tablet Take 1 tablet (90 mg total) by mouth 2 (two) times daily. 10/20/18  Yes Arty Baumgartner, NP  valACYclovir (VALTREX) 500 MG tablet Take 500 mg by mouth 2 (two) times daily as needed (cold sores).   Yes [provider]                                                                                                                                    Past Surgical History Past Surgical History:  Procedure Laterality Date  . CATARACT EXTRACTION    . CORONARY/GRAFT ACUTE MI REVASCULARIZATION N/A 10/18/2018   Procedure: Coronary/Graft Acute MI Revascularization;  Surgeon: Runell Gess, MD;  Location: [facility] INVASIVE CV LAB;  Service: Cardiovascular;  Laterality: N/A;  . CYSTOSCOPY W/ URETERAL STENT PLACEMENT Left 05/07/2014   Procedure: CYSTOSCOPY WITH RETROGRADE PYELOGRAM/ LEFT URETERAL STENT PLACEMENT;  Surgeon: Crist Fat, MD;  Location: [facility] ORS;  Service: Urology;  Laterality: Left;  . CYSTOSCOPY WITH RETROGRADE PYELOGRAM, URETEROSCOPY AND STENT PLACEMENT Left 05/14/2014   Procedure: LEFT URETEROSCOPY AND STENT EXCHANGE,STONE BASKETRY EXTRACTION;  Surgeon: Jerilee Field, MD;  Location: [facility] ORS;  Service: Urology;  Laterality: Left;  . HOLMIUM LASER APPLICATION Left 05/14/2014   Procedure: HOLMIUM LASER APPLICATION;  Surgeon: Jerilee Field, MD;  Location: [facility] ORS;  Service: Urology;  Laterality: Left;  . INTRAOCULAR LENS INSERTION Right   . LEFT HEART CATH AND CORONARY ANGIOGRAPHY N/A 10/18/2018   Procedure: LEFT HEART CATH AND CORONARY ANGIOGRAPHY;  Surgeon: Runell Gess, MD;  Location: [facility] INVASIVE CV LAB;  Service: Cardiovascular;  Laterality: N/A;   Family History Family History  Problem Relation Age of Onset  . Heart attack Father        died @ 62    Social History Social History   Tobacco Use  . Smoking status: Never Smoker  . Smokeless tobacco: Never Used  Substance Use Topics  . Alcohol use: No  . Drug use: No   Allergies Patient has  no known allergies.  Review of Systems Review of Systems All other systems are reviewed and are negative for acute change except as noted in the HPI  Physical Exam Vital Signs  I have reviewed the triage vital signs Pulse 73   Temp 97.8 F (36.6 C) (Oral)   Resp (!) 22   Ht 6' (1.829 m)   Wt 81.2 kg   SpO2 96%   BMI 24.28 kg/m   Physical Exam Vitals signs reviewed.  Constitutional:      General: He is not in acute distress.    Appearance: He is well-developed. He is not diaphoretic.  HENT:     Head: Normocephalic and atraumatic.     Nose: Nose normal.  Eyes:     General: No scleral icterus.       Right eye: No discharge.        Left eye: No discharge.     Conjunctiva/sclera: Conjunctivae normal.     Pupils: Pupils are equal, round, and reactive to light.      Comments: Disconjugate gaze  Neck:     Musculoskeletal: Normal range of motion and neck supple.  Cardiovascular:     Rate and Rhythm: Normal rate and regular rhythm.     Heart sounds: No murmur. No friction rub. No gallop.   Pulmonary:     Effort: Pulmonary effort is normal. No respiratory distress.     Breath sounds: Normal breath sounds. No stridor. No rales.  Abdominal:     General: There is no distension.     Palpations: Abdomen is soft.     Tenderness: There is no abdominal tenderness.  Musculoskeletal:        General: No tenderness.     Left lower leg: Edema present.  Skin:    General: Skin is warm and dry.     Findings: No erythema or rash.  Neurological:     Mental Status: He is alert and oriented to  person, place, and time.     ED Results and Treatments Labs (all labs ordered are listed, but only abnormal results are displayed) Labs Reviewed  COMPREHENSIVE METABOLIC PANEL - Abnormal; Notable for the following components:      Result Value   Glucose, Bld 133 (*)    BUN 27 (*)    Total Protein 5.8 (*)    Albumin 3.3 (*)    All other components within normal limits  D-DIMER, QUANTITATIVE (NOT AT [facility]) - Abnormal; Notable for the following components:   D-Dimer, Quant 2.57 (*)    All other components within normal limits  TROPONIN I - Abnormal; Notable for the following components:   Troponin I 0.20 (*)    All other components within normal limits  SARS CORONAVIRUS 2 (HOSPITAL ORDER, PERFORMED IN [facility])  CBC  BRAIN NATRIURETIC PEPTIDE  HEPARIN LEVEL (UNFRACTIONATED)  EKG  EKG Interpretation  Date/Time:  Thursday Oct 22 2018 00:41:34 EDT Ventricular Rate:  79 PR Interval:    QRS Duration: 74 QT Interval:  372 QTC Calculation: 427 R Axis:   41 Text Interpretation:  Sinus rhythm Posterior infarct, old Borderline T abnormalities, inferior leads No significant change since last tracing Confirmed by Drema Pry [phone]) on 10/22/2018 5:31:34 AM      Radiology Dg Chest 2 View  Result Date: 10/22/2018 CLINICAL DATA:  Chest pain for 1 day particularly on the right EXAM: CHEST - 2 VIEW COMPARISON:  10/18/2018 FINDINGS: Cardiac shadows within normal limits. Aortic calcifications are seen. The lungs are well aerated bilaterally. No focal infiltrate or sizable effusion is seen. No bony abnormality is noted. IMPRESSION: No acute abnormality seen. Electronically Signed   By: Alcide Clever M.D.   On: 10/22/2018 01:34   Ct Angio Chest Pe W And/or Wo Contrast  Result Date: 10/22/2018 CLINICAL DATA:  Dyspnea. EXAM: CT ANGIOGRAPHY CHEST WITH CONTRAST  TECHNIQUE: Multidetector CT imaging of the chest was performed using the standard protocol during bolus administration of intravenous contrast. Multiplanar CT image reconstructions and MIPs were obtained to evaluate the vascular anatomy. CONTRAST:  80mL OMNIPAQUE IOHEXOL 350 MG/ML SOLN COMPARISON:  None. FINDINGS: Cardiovascular: Normal heart size. No pericardial effusion. There is aortic and coronary atherosclerosis. Positive for multifocal segmental and subsegmental pulmonary emboli seen in both lower lobes and the right middle lobe. No right heart dilatation. Mediastinum/Nodes: Negative for adenopathy or mass. Lungs/Pleura: Lower lobe ground-glass opacity primarily attributed atelectasis, although there may be an element of pulmonary ischemia. No pulmonary edema or pleural effusion. 4 mm right upper lobe pulmonary nodule, too small for follow-up purposes. Upper Abdomen: Multiple calcified gallstones.  No acute finding Musculoskeletal: Spondylosis. There is multilevel thoracic ankylosis. No acute osseous finding. Delay in reporting due to system wide outage that has just completed. Critical Value/emergent results were called by telephone at the time of interpretation on 10/22/2018 at 5:17 am to Dr. Drema Pry , who verbally acknowledged these results. Review of the MIP images confirms the above findings. IMPRESSION: Positive for multifocal segmental and subsegmental pulmonary emboli. No right heart dilatation. Atherosclerosis and cholelithiasis. Electronically Signed   By: Marnee Spring M.D.   On: 10/22/2018 05:17   Pertinent labs & imaging results that were available during my care of the patient were reviewed by me and considered in my medical decision making (see chart for details).  Medications Ordered in ED Medications  iohexol (OMNIPAQUE) 300 MG/ML solution 80 mL (has no administration in time range)  iopamidol (ISOVUE-250) 51 % injection 100 mL (has no administration in time range)  heparin  bolus via infusion 5,000 Units (has no administration in time range)  heparin ADULT infusion 100 units/mL (25000 units/270mL sodium chloride 0.45%) (has no administration in time range)  iohexol (OMNIPAQUE) 350 MG/ML injection 80 mL (80 mLs Intravenous Contrast Given 10/22/18 0351)  Procedures .Critical Care Performed by: Nira Conn, MD Authorized by: Nira Conn, MD     CRITICAL CARE Performed by: Amadeo Garnet Sebert Stollings Total critical care time: 45 minutes Critical care time was exclusive of separately billable procedures and treating other patients. Critical care was necessary to treat or prevent imminent or life-threatening deterioration. Critical care was time spent personally by me on the following activities: development of treatment plan with patient and/or surrogate as well as nursing, discussions with consultants, evaluation of patient's response to treatment, examination of patient, obtaining history from patient or surrogate, ordering and performing treatments and interventions, ordering and review of laboratory studies, ordering and review of radiographic studies, pulse oximetry and re-evaluation of patient's condition.    (including critical care time)  Medical Decision Making / ED Course I have reviewed the nursing notes for this encounter and the patient's prior records (if available in EHR or on provided paperwork).    Patient presents with right-sided pleuritic chest pain.  Recent STEMI status post stenting.  EKG without acute ischemic changes or evidence of pericarditis.   Work-up notable for multiple multifocal segmental and subsegmental pulmonary emboli without evidence of right heart strain.  Patient started on heparin infusion.  Admitted to medicine for further management.  Final Clinical Impression(s) / ED Diagnoses  Final diagnoses:  Chest pain  Multiple subsegmental pulmonary emboli without acute cor pulmonale      This chart was dictated using voice recognition software.  Despite best efforts to proofread,  errors can occur which can change the documentation meaning.   Nira Conn, MD 10/22/18 [phone]

## 2018-10-22 NOTE — ED Notes (Signed)
RN attempted to call report 

## 2018-10-22 NOTE — ED Notes (Signed)
ED TO INPATIENT HANDOFF REPORT  ED Nurse Name and Phone #:  Raina Mina [phone]  S Name/Age/Gender Gordon Novak 84 y.o. male Room/Bed: 032C/032C  Code Status   Code Status: Prior  Home/SNF/Other Home Patient oriented to: self, place, time and situation Is this baseline? Yes   Triage Complete: Triage complete  Chief Complaint pain on right side   Triage Note Pt presents to the ED w/ c/o pain on the right side when breathing. Pt had a stent placement on 10/18/2018 through [facility].    Allergies No Known Allergies  Level of Care/Admitting Diagnosis ED Disposition    ED Disposition Condition Comment   Admit  Hospital Area: [facility] [[id number]]  Level of Care: Progressive [102]  I expect the patient will be discharged within 24 hours: No (not a candidate for 5C-Observation unit)  Covid Evaluation: Screening Protocol (No Symptoms)  Diagnosis: Pulmonary embolism (HCC) [241700]  Admitting Physician: Eduard Clos [phone]  Attending Physician: Eduard Clos Florian.Pax  PT Class (Do Not Modify): Observation [104]  PT Acc Code (Do Not Modify): Observation [10022]       B Medical/Surgery History Past Medical History:  Diagnosis Date  . Acute systolic heart failure (HCC)    35-40%  . Bladder outlet obstruction   . Blindness of left eye   . Chronic Left leg swelling    a. Since 2007.  Marland Kitchen Essential hypertension   . Gout   . Hemorrhoids 05/12/2014  . Nephrolithiasis   . Pre-diabetes   . STEMI (ST elevation myocardial infarction) (HCC)    10/18/18 PCI[facility]DES [facility]1 to the [facility]   Past Surgical History:  Procedure Laterality Date  . CATARACT EXTRACTION    . CORONARY/GRAFT ACUTE MI REVASCULARIZATION N/A 10/18/2018   Procedure: Coronary/Graft Acute MI Revascularization;  Surgeon: Runell Gess, MD;  Location: [facility] INVASIVE CV LAB;  Service: Cardiovascular;  Laterality: N/A;  . CYSTOSCOPY W/ URETERAL STENT PLACEMENT Left 05/07/2014   Procedure:  CYSTOSCOPY WITH RETROGRADE PYELOGRAM/ LEFT URETERAL STENT PLACEMENT;  Surgeon: Crist Fat, MD;  Location: [facility];  Service: Urology;  Laterality: Left;  . CYSTOSCOPY WITH RETROGRADE PYELOGRAM, URETEROSCOPY AND STENT PLACEMENT Left 05/14/2014   Procedure: LEFT URETEROSCOPY AND STENT EXCHANGE,STONE BASKETRY EXTRACTION;  Surgeon: Jerilee Field, MD;  Location: [facility];  Service: Urology;  Laterality: Left;  . HOLMIUM LASER APPLICATION Left 05/14/2014   Procedure: HOLMIUM LASER APPLICATION;  Surgeon: Jerilee Field, MD;  Location: [facility];  Service: Urology;  Laterality: Left;  . INTRAOCULAR LENS INSERTION Right   . LEFT HEART CATH AND CORONARY ANGIOGRAPHY N/A 10/18/2018   Procedure: LEFT HEART CATH AND CORONARY ANGIOGRAPHY;  Surgeon: Runell Gess, MD;  Location: [facility] INVASIVE CV LAB;  Service: Cardiovascular;  Laterality: N/A;     A IV Location/Drains/Wounds Patient Lines/Drains/Airways Status   Active Line/Drains/Airways    Name:   Placement date:   Placement time:   Site:   Days:   Peripheral IV 10/22/18 Left Antecubital   10/22/18    0343    Antecubital   less than 1          Intake/Output Last 24 hours No intake or output data in the 24 hours ending 10/22/18 0548  Labs/Imaging Results for orders placed or performed during the hospital encounter of 10/22/18 (from the past 48 hour(s))  CBC     Status: None   Collection Time: 10/22/18  1:17 AM  Result Value Ref Range   WBC 9.2 4.0 -  10.5 K/uL   RBC 4.69 4.22 - 5.81 MIL/uL   Hemoglobin 14.9 13.0 - 17.0 g/dL   HCT 40.944.2 81.139.0 - 91.452.0 %   MCV 94.2 80.0 - 100.0 fL   MCH 31.8 26.0 - 34.0 pg   MCHC 33.7 30.0 - 36.0 g/dL   RDW 78.213.5 95.611.5 - 21.315.5 %   Platelets 191 150 - 400 K/uL   nRBC 0.0 0.0 - 0.2 %    Comment: Performed at [facility], [address] [place], [place] [address]  Comprehensive metabolic panel     Status: Abnormal   Collection Time: 10/22/18  1:17 AM  Result Value Ref Range   Sodium 139 135 - 145  mmol/L   Potassium 4.0 3.5 - 5.1 mmol/L   Chloride 104 98 - 111 mmol/L   CO2 23 22 - 32 mmol/L   Glucose, Bld 133 (H) 70 - 99 mg/dL   BUN 27 (H) 8 - 23 mg/dL   Creatinine, Ser 8.461.10 0.61 - 1.24 mg/dL   Calcium 8.9 8.9 - 96.210.3 mg/dL   Total Protein 5.8 (L) 6.5 - 8.1 g/dL   Albumin 3.3 (L) 3.5 - 5.0 g/dL   AST 34 15 - 41 U/L   ALT 31 0 - 44 U/L   Alkaline Phosphatase 101 38 - 126 U/L   Total Bilirubin 1.2 0.3 - 1.2 mg/dL   GFR calc non Af Amer >60 >60 mL/min   GFR calc Af Amer >60 >60 mL/min   Anion gap 12 5 - 15    Comment: Performed at [facility], [address] [place], [place] [address]  D-dimer, quantitative (not at [facility])     Status: Abnormal   Collection Time: 10/22/18  1:17 AM  Result Value Ref Range   D-Dimer, Quant 2.57 (H) 0.00 - 0.50 ug/mL-FEU    Comment: (NOTE) At the manufacturer cut-off of 0.50 ug/mL FEU, this assay has been documented to exclude PE with a sensitivity and negative predictive value of 97 to 99%.  At this time, this assay has not been approved by the FDA to exclude DVT/VTE. Results should be correlated with clinical presentation. Performed at [facility], [address] [place], [place] [address]   Brain natriuretic peptide     Status: None   Collection Time: 10/22/18  1:17 AM  Result Value Ref Range   B Natriuretic Peptide 24.6 0.0 - 100.0 pg/mL    Comment: Performed at [facility], [address] [place], [place] [address]  Troponin I - ONCE - STAT     Status: Abnormal   Collection Time: 10/22/18  1:17 AM  Result Value Ref Range   Troponin I 0.20 (HH) <0.03 ng/mL    Comment: CRITICAL RESULT CALLED TO, READ BACK BY AND VERIFIED WITH: Mcadoo Muzquiz,M RN 10/22/2018 0247 JORDANS Performed at [facility], [address] [place], [place] [address]    Dg Chest 2 View  Result Date: 10/22/2018 CLINICAL DATA:  Chest pain for 1 day particularly on the right EXAM: CHEST - 2 VIEW COMPARISON:  10/18/2018 FINDINGS: Cardiac shadows  within normal limits. Aortic calcifications are seen. The lungs are well aerated bilaterally. No focal infiltrate or sizable effusion is seen. No bony abnormality is noted. IMPRESSION: No acute abnormality seen. Electronically Signed   By: Alcide CleverMark  Lukens M.D.   On: 10/22/2018 01:34   Ct Angio Chest Pe W And/or Wo Contrast  Result Date: 10/22/2018 CLINICAL DATA:  Dyspnea. EXAM: CT  ANGIOGRAPHY CHEST WITH CONTRAST TECHNIQUE: Multidetector CT imaging of the chest was performed using the standard protocol during bolus administration of intravenous contrast. Multiplanar CT image reconstructions and MIPs were obtained to evaluate the vascular anatomy. CONTRAST:  80mL OMNIPAQUE IOHEXOL 350 MG/ML SOLN COMPARISON:  None. FINDINGS: Cardiovascular: Normal heart size. No pericardial effusion. There is aortic and coronary atherosclerosis. Positive for multifocal segmental and subsegmental pulmonary emboli seen in both lower lobes and the right middle lobe. No right heart dilatation. Mediastinum/Nodes: Negative for adenopathy or mass. Lungs/Pleura: Lower lobe ground-glass opacity primarily attributed atelectasis, although there may be an element of pulmonary ischemia. No pulmonary edema or pleural effusion. 4 mm right upper lobe pulmonary nodule, too small for follow-up purposes. Upper Abdomen: Multiple calcified gallstones.  No acute finding Musculoskeletal: Spondylosis. There is multilevel thoracic ankylosis. No acute osseous finding. Delay in reporting due to system wide outage that has just completed. Critical Value/emergent results were called by telephone at the time of interpretation on 10/22/2018 at 5:17 am to Dr. Drema Pry , who verbally acknowledged these results. Review of the MIP images confirms the above findings. IMPRESSION: Positive for multifocal segmental and subsegmental pulmonary emboli. No right heart dilatation. Atherosclerosis and cholelithiasis. Electronically Signed   By: Marnee Spring M.D.   On:  10/22/2018 05:17    Pending Labs Unresulted Labs (From admission, onward)    Start     Ordered   10/22/18 1300  Heparin level (unfractionated)  Once-Timed,   R     10/22/18 0524   10/22/18 0519  SARS Coronavirus 2 (CEPHEID - Performed in [facility] hospital lab), Hosp Order  (Asymptomatic Patients Labs)  Once,   R    Question:  Rule Out  Answer:  Yes   10/22/18 0519          Vitals/Pain Today's Vitals   10/22/18 0300 10/22/18 0315 10/22/18 0330 10/22/18 0345  BP: (!) 129/58 129/63 131/61 117/76  Pulse: 79 82 79 81  Resp: Temp:      TempSrc:      SpO2: 97% 97% 96% 99%  Weight:      Height:      PainSc:        Isolation Precautions No active isolations  Medications Medications  iohexol (OMNIPAQUE) 300 MG/ML solution 80 mL (has no administration in time range)  iopamidol (ISOVUE-250) 51 % injection 100 mL (has no administration in time range)  heparin ADULT infusion 100 units/mL (25000 units/256mL sodium chloride 0.45%) (1,450 Units/hr Intravenous New Bag/Given 10/22/18 0546)  iohexol (OMNIPAQUE) 350 MG/ML injection 80 mL (80 mLs Intravenous Contrast Given 10/22/18 0351)  heparin bolus via infusion 5,000 Units (5,000 Units Intravenous Bolus from Bag 10/22/18 0546)    Mobility walks Moderate fall risk   Focused Assessments Pulmonary Assessment Handoff:  Lung sounds:   O2 Device: Room Air        R Recommendations: See Admitting Provider Note  Report given to:   Additional Notes:

## 2018-10-22 NOTE — Progress Notes (Signed)
Spoke with patient regarding calling wife. Patient stated that he wishes for me to wait until later because she stayed outside in car in ED parking lot the whole night and she was resting. Will try to update wife after her rest. Amaru Burroughs, Randall An rN

## 2018-10-22 NOTE — Progress Notes (Signed)
LE venous duplex       has been completed. Preliminary results can be found under CV proc through chart review. Soham Hollett, BS, RDMS, RVT   

## 2018-10-22 NOTE — Progress Notes (Signed)
Spoke with patient daughter and updated her on patient. Daughter wanted to speak with MD, Doctor paged through [facility] system to make aware. Will continue to monitor patient. Haik Mahoney, Randall An rN

## 2018-10-22 NOTE — ED Notes (Signed)
Patient transported to CT 

## 2018-10-22 NOTE — H&P (Signed)
History and Physical    Gordon Novak:[id number] DOB: 12/31/1934 DOA: 10/22/2018  PCP: [name], MD  Patient coming from: Home.  Chief Complaint: Chest pain.  HPI: Gordon Novak is a 84 y.o. male with recent admission for acute ST elevation MI status post stenting discharged 2 days ago presents with complaint of increasing right-sided pleuritic chest pain since last night.  Denies any fever chills productive cough or shortness of breath.  Patient states the chest pain is different from what he had last week.  Has been having chronic left lower extremity over the last 10 to 12 years after procedure for left kidney stone per patient.  ED Course: In the ER EKG shows nothing acute.  Troponin was 0.2 and d-dimer was 2.57.  Hemoglobin 14.9 platelets 191 creatinine 1.1.  COVID-19 test was negative.  Given the elevated d-dimer patient underwent CT angiogram of the chest which shows multiple segmental and subsegmental pulmonary embolism with no heart strain.  Patient was started on a heparin and admitted for further work-up.  Review of Systems: As per HPI, rest all negative.   Past Medical History:  Diagnosis Date  . Acute systolic heart failure (HCC)    35-40%  . Bladder outlet obstruction   . Blindness of left eye   . Chronic Left leg swelling    a. Since 2007.  Marland Kitchen Essential hypertension   . Gout   . Hemorrhoids 05/12/2014  . Nephrolithiasis   . Pre-diabetes   . STEMI (ST elevation myocardial infarction) (HCC)    10/18/18 PCI/DES x1 to the [facility]    Past Surgical History:  Procedure Laterality Date  . CATARACT EXTRACTION    . CORONARY/GRAFT ACUTE MI REVASCULARIZATION N/A 10/18/2018   Procedure: Coronary/Graft Acute MI Revascularization;  Surgeon: Runell Gess, MD;  Location: [facility] INVASIVE CV LAB;  Service: Cardiovascular;  Laterality: N/A;  . CYSTOSCOPY W/ URETERAL STENT PLACEMENT Left 05/07/2014   Procedure: CYSTOSCOPY WITH RETROGRADE PYELOGRAM/ LEFT URETERAL STENT  PLACEMENT;  Surgeon: Crist Fat, MD;  Location: [facility] ORS;  Service: Urology;  Laterality: Left;  . CYSTOSCOPY WITH RETROGRADE PYELOGRAM, URETEROSCOPY AND STENT PLACEMENT Left 05/14/2014   Procedure: LEFT URETEROSCOPY AND STENT EXCHANGE,STONE BASKETRY EXTRACTION;  Surgeon: Jerilee Field, MD;  Location: [facility] ORS;  Service: Urology;  Laterality: Left;  . HOLMIUM LASER APPLICATION Left 05/14/2014   Procedure: HOLMIUM LASER APPLICATION;  Surgeon: Jerilee Field, MD;  Location: [facility] ORS;  Service: Urology;  Laterality: Left;  . INTRAOCULAR LENS INSERTION Right   . LEFT HEART CATH AND CORONARY ANGIOGRAPHY N/A 10/18/2018   Procedure: LEFT HEART CATH AND CORONARY ANGIOGRAPHY;  Surgeon: Runell Gess, MD;  Location: [facility] INVASIVE CV LAB;  Service: Cardiovascular;  Laterality: N/A;     reports that he has never smoked. He has never used smokeless tobacco. He reports that he does not drink alcohol or use drugs.  No Known Allergies  Family History  Problem Relation Age of Onset  . Heart attack Father        died @ 12    Prior to Admission medications   Medication Sig Start Date End Date Taking? Authorizing Provider  allopurinol (ZYLOPRIM) 300 MG tablet Take 300 mg by mouth daily.  02/15/14  Yes [provider]  aspirin EC 81 MG EC tablet Take 1 tablet (81 mg total) by mouth daily. 10/20/18  Yes Arty Baumgartner, NP  atorvastatin (LIPITOR) 80 MG tablet Take 1 tablet (80 mg total) by mouth daily at  6 PM. 10/20/18  Yes Laverda Page B, NP  beta carotene w/minerals (OCUVITE) tablet Take 1 tablet by mouth daily.    Yes [provider]  bisacodyl (DULCOLAX) 5 MG EC tablet Take 1 tablet (5 mg total) by mouth daily. 05/14/14  Yes TatOnalee Hua, MD  docusate sodium 100 MG CAPS Take 100 mg by mouth 2 (two) times daily. Patient taking differently: Take 100 mg by mouth at bedtime.  05/14/14  Yes Tat, Onalee Hua, MD  furosemide (LASIX) 20 MG tablet Take 20 mg by mouth daily.  02/15/14  Yes  [provider]  lisinopril (PRINIVIL,ZESTRIL) 10 MG tablet Take 10 mg by mouth daily.  02/15/14  Yes [provider]  metFORMIN (GLUCOPHAGE-XR) 500 MG 24 hr tablet Take 500 mg by mouth daily with breakfast. 10/07/18  Yes [provider]  metoprolol tartrate (LOPRESSOR) 25 MG tablet Take 0.5 tablets (12.5 mg total) by mouth 2 (two) times daily. 10/20/18  Yes Arty Baumgartner, NP  Multiple Vitamin (MULTIVITAMIN WITH MINERALS) TABS tablet Take 1 tablet by mouth every evening.    Yes [provider]  nitroGLYCERIN (NITROSTAT) 0.4 MG SL tablet Place 1 tablet (0.4 mg total) under the tongue every 5 (five) minutes x 3 doses as needed for chest pain. 10/20/18  Yes Arty Baumgartner, NP  oxyCODONE-acetaminophen (PERCOCET/ROXICET) 5-325 MG per tablet Take 2 tablets by mouth every 6 (six) hours as needed for severe pain.  05/05/14  Yes [provider]  potassium chloride SA (K-DUR) 20 MEQ tablet Take 20 mEq by mouth daily.  12/18/17  Yes [provider]  prednisoLONE acetate (PRED FORTE) 1 % ophthalmic suspension Place 1 drop into both eyes at bedtime.  05/09/14  Yes [provider]  PROCTOFOAM HC rectal foam Place 1 applicator rectally 3 (three) times daily as needed for hemorrhoids or itching.  05/16/14  Yes [provider]  tamsulosin (FLOMAX) 0.4 MG CAPS capsule Take 2 capsules (0.8 mg total) by mouth daily. Patient taking differently: Take 0.4 mg by mouth daily.  05/08/14  Yes Crist Fat, MD  ticagrelor (BRILINTA) 90 MG TABS tablet Take 1 tablet (90 mg total) by mouth 2 (two) times daily. 10/20/18  Yes Arty Baumgartner, NP  valACYclovir (VALTREX) 500 MG tablet Take 500 mg by mouth 2 (two) times daily as needed (cold sores).   Yes [provider]    Physical Exam: Vitals:   10/22/18 0345 10/22/18 0622 10/22/18 0633 10/22/18 0635  BP: 117/76 (!) 125/56  (!) 113/48  Pulse: 81 77  82  Resp: Temp:    98.1  F (36.7 C)  TempSrc:    Oral  SpO2: 99% 96%  96%  Weight:   81.8 kg   Height:   6' (1.829 m)       Constitutional: Moderately built and nourished. Vitals:   10/22/18 0345 10/22/18 0622 10/22/18 0633 10/22/18 0635  BP: 117/76 (!) 125/56  (!) 113/48  Pulse: 81 77  82  Resp: Temp:    98.1 F (36.7 C)  TempSrc:    Oral  SpO2: 99% 96%  96%  Weight:   81.8 kg   Height:   6' (1.829 m)    Eyes: Anicteric no pallor. ENMT: No discharge from the ears eyes nose or mouth. Neck: No JVD elevated no mass felt. Respiratory: No rhonchi or crepitations. Cardiovascular: S1-S2 heard. Abdomen: Soft nontender bowel sounds present. Musculoskeletal: Edema of the  left lower extremity. Skin: No rash. Neurologic: Alert awake oriented to time place and person.  Moves all extremities. Psychiatric: Appears normal per normal affect.   Labs on Admission: I have personally reviewed following labs and imaging studies  CBC: Recent Labs  Lab 10/18/18 0625 10/19/18 0238 10/22/18 0117  WBC 7.4 7.9 9.2  HGB 17.1* 15.0 14.9  HCT 52.1* 44.7 44.2  MCV 96.3 94.3 94.2  PLT 195 168 191   Basic Metabolic Panel: Recent Labs  Lab 10/18/18 0625 10/19/18 0238 10/22/18 0117  NA 144 142 139  K 4.2 3.7 4.0  CL 103 105 104  CO2 GLUCOSE 163* 113* 133*  BUN 18 17 27*  CREATININE 1.21 1.01 1.10  CALCIUM 9.9 8.8* 8.9   GFR: Estimated Creatinine Clearance: 57.8 mL/min (by C-G formula based on SCr of 1.1 mg/dL). Liver Function Tests: Recent Labs  Lab 10/22/18 0117  AST 34  ALT 31  ALKPHOS 101  BILITOT 1.2  PROT 5.8*  ALBUMIN 3.3*   No results for input(s): LIPASE, AMYLASE in the last 168 hours. No results for input(s): AMMONIA in the last 168 hours. Coagulation Profile: Recent Labs  Lab 10/18/18 0851  INR 1.0   Cardiac Enzymes: Recent Labs  Lab 10/18/18 0625 10/19/18 0938 10/19/18 1417 10/19/18 1958 10/22/18 0117  TROPONINI <0.03 0.93* 0.94* 0.84* 0.20*   BNP  (last 3 results) No results for input(s): PROBNP in the last 8760 hours. HbA1C: No results for input(s): HGBA1C in the last 72 hours. CBG: No results for input(s): GLUCAP in the last 168 hours. Lipid Profile: No results for input(s): CHOL, HDL, LDLCALC, TRIG, CHOLHDL, LDLDIRECT in the last 72 hours. Thyroid Function Tests: No results for input(s): TSH, T4TOTAL, FREET4, T3FREE, THYROIDAB in the last 72 hours. Anemia Panel: No results for input(s): VITAMINB12, FOLATE, FERRITIN, TIBC, IRON, RETICCTPCT in the last 72 hours. Urine analysis:    Component Value Date/Time   COLORURINE AMBER (A) 05/12/2014 0718   APPEARANCEUR CLOUDY (A) 05/12/2014 0718   LABSPEC 1.020 05/12/2014 0718   PHURINE 6.0 05/12/2014 0718   GLUCOSEU NEGATIVE 05/12/2014 0718   HGBUR LARGE (A) 05/12/2014 0718   BILIRUBINUR SMALL (A) 05/12/2014 0718   KETONESUR 15 (A) 05/12/2014 0718   PROTEINUR 100 (A) 05/12/2014 0718   UROBILINOGEN 1.0 05/12/2014 0718   NITRITE NEGATIVE 05/12/2014 0718   LEUKOCYTESUR MODERATE (A) 05/12/2014 0718   Sepsis Labs: (procalcitonin:4,lacticidven:4) ) Recent Results (from the past 240 hour(s))  SARS Coronavirus 2 (CEPHEID - Performed in [facility] hospital lab), Hosp Order     Status: None   Collection Time: 10/18/18  8:40 AM  Result Value Ref Range Status   SARS Coronavirus 2 NEGATIVE NEGATIVE Final    Comment: (NOTE) If result is NEGATIVE SARS-CoV-2 target nucleic acids are NOT DETECTED. The SARS-CoV-2 RNA is generally detectable in upper and lower  respiratory specimens during the acute phase of infection. The lowest  concentration of SARS-CoV-2 viral copies this assay can detect is 250  copies / mL. A negative result does not preclude SARS-CoV-2 infection  and should not be used as the sole basis for treatment or other  patient management decisions.  A negative result may occur with  improper specimen collection / handling, submission of specimen other  than  nasopharyngeal swab, presence of viral mutation(s) within the  areas targeted by this assay, and inadequate number of viral copies  (<250 copies / mL). A negative result must be combined with clinical  observations,  patient history, and epidemiological information. If result is POSITIVE SARS-CoV-2 target nucleic acids are DETECTED. The SARS-CoV-2 RNA is generally detectable in upper and lower  respiratory specimens dur ing the acute phase of infection.  Positive  results are indicative of active infection with SARS-CoV-2.  Clinical  correlation with patient history and other diagnostic information is  necessary to determine patient infection status.  Positive results do  not rule out bacterial infection or co-infection with other viruses. If result is PRESUMPTIVE POSTIVE SARS-CoV-2 nucleic acids MAY BE PRESENT.   A presumptive positive result was obtained on the submitted specimen  and confirmed on repeat testing.  While 2019 novel coronavirus  (SARS-CoV-2) nucleic acids may be present in the submitted sample  additional confirmatory testing may be necessary for epidemiological  and / or clinical management purposes  to differentiate between  SARS-CoV-2 and other Sarbecovirus currently known to infect humans.  If clinically indicated additional testing with an alternate test  methodology [phone]) is advised. The SARS-CoV-2 RNA is generally  detectable in upper and lower respiratory sp ecimens during the acute  phase of infection. The expected result is Negative. Fact Sheet for Patients:  BoilerBrush.com.cy Fact Sheet for Healthcare Providers: https://pope.com/ This test is not yet approved or cleared by the [place] FDA and has been authorized for detection and/or diagnosis of SARS-CoV-2 by FDA under an Emergency Use Authorization (EUA).  This EUA will remain in effect (meaning this test can be used) for the duration of the  COVID-19 declaration under Section 564(b)(1) of the Act, 21 U.S.C. section 360bbb-3(b)(1), unless the authorization is terminated or revoked sooner. Performed at [facility], [address] [place], [place] [address]      Radiological Exams on Admission: Dg Chest 2 View  Result Date: 10/22/2018 CLINICAL DATA:  Chest pain for 1 day particularly on the right EXAM: CHEST - 2 VIEW COMPARISON:  10/18/2018 FINDINGS: Cardiac shadows within normal limits. Aortic calcifications are seen. The lungs are well aerated bilaterally. No focal infiltrate or sizable effusion is seen. No bony abnormality is noted. IMPRESSION: No acute abnormality seen. Electronically Signed   By: Alcide Clever M.D.   On: 10/22/2018 01:34   Ct Angio Chest Pe W And/or Wo Contrast  Result Date: 10/22/2018 CLINICAL DATA:  Dyspnea. EXAM: CT ANGIOGRAPHY CHEST WITH CONTRAST TECHNIQUE: Multidetector CT imaging of the chest was performed using the standard protocol during bolus administration of intravenous contrast. Multiplanar CT image reconstructions and MIPs were obtained to evaluate the vascular anatomy. CONTRAST:  68mL OMNIPAQUE IOHEXOL 350 MG/ML SOLN COMPARISON:  None. FINDINGS: Cardiovascular: Normal heart size. No pericardial effusion. There is aortic and coronary atherosclerosis. Positive for multifocal segmental and subsegmental pulmonary emboli seen in both lower lobes and the right middle lobe. No right heart dilatation. Mediastinum/Nodes: Negative for adenopathy or mass. Lungs/Pleura: Lower lobe ground-glass opacity primarily attributed atelectasis, although there may be an element of pulmonary ischemia. No pulmonary edema or pleural effusion. 4 mm right upper lobe pulmonary nodule, too small for follow-up purposes. Upper Abdomen: Multiple calcified gallstones.  No acute finding Musculoskeletal: Spondylosis. There is multilevel thoracic ankylosis. No acute osseous finding. Delay in reporting due to system wide outage that  has just completed. Critical Value/emergent results were called by telephone at the time of interpretation on 10/22/2018 at 5:17 am to Dr. Drema Pry , who verbally acknowledged these results. Review of the MIP images confirms the above findings. IMPRESSION: Positive for multifocal segmental and subsegmental pulmonary emboli. No right  heart dilatation. Atherosclerosis and cholelithiasis. Electronically Signed   By: Marnee SpringJonathon  Watts M.D.   On: 10/22/2018 05:17    EKG: Independently reviewed.  Normal sinus rhythm.  Assessment/Plan Principal Problem:   Pulmonary embolism (HCC) Active Problems:   Gout   Type 2 diabetes mellitus with vascular disease (HCC)   CAD (coronary artery disease)    1. Acute bilateral pulmonary embolism involving segmental and subsegmental -no heart strain as per the CAT scan report.  We will cycle cardiac markers patient has been started on heparin.  Patient is already on dual antiplatelet therapy.  May discuss with cardiology about long-term anticoagulation.  We continue on Percocet which patient has been taking for pleuritic type of chest pain.  Has left lower extremity edema will check Dopplers.  Precipitating factor for the PE is not sure.  Likely unprovoked.  Presently hemodynamically stable. 2. Recent admission for acute ST MI on statin dual antiplatelet therapy beta-blocker.  Cycle cardiac markers. 3. Systolic heart failure recent 2D echo showed EF of 35 to 40%.  On Lasix and ACE inhibitor.  Appears compensated. 4. History of gout on allopurinol. 5. Diabetes mellitus type 2 on sliding scale coverage.   DVT prophylaxis: Heparin. Code Status: Full code as confirmed with patient. Family Communication: Discussed with patient.  Patient states his wife is aware of his diagnosis. Disposition Plan: Home. Consults called: None. Admission status: Observation.   Eduard ClosArshad N Dream Harman MD [facility] Pager [phone].  If 7PM-7AM, please contact night-coverage  [email or web address] Password [email or web address]  10/22/2018, 6:54 AM

## 2018-10-22 NOTE — TOC Benefit Eligibility Note (Signed)
Transition of Care [facility]) Benefit Eligibility Note    Patient Details  Name: Gordon Novak MRN: [id number] Date of Birth: 1934/10/29   Medication/Dose: Alveda Reasons  15 MG BID(XARELTO 20 MG DAILY, COVER- YES, CO-PAY-$275.99  TIER- 2 DRUG, P/A-NO)  Covered?: Yes  Tier: 2 Drug  Prescription Coverage Preferred Pharmacy: [facility] )  Spoke with Person/Company/Phone Number:: ANGELA ([facility] # [phone])  Co-Pay: $ 275.99     Prior Approval: No  Deductible: Met(OUT-OF-POCKET-NOT MET)  Additional Notes: ELIQUIS  10 MG - NON-FORMULARY(ELIQUIS  2.5 MG BID AND ELIQUIS 5 MG BID  COVER- YES, CO-PAY-$ 138.36, TIER-  3 DRUG, P/A-NO)    Memory Argue Phone Number: 10/22/2018, 1:48 PM

## 2018-10-22 NOTE — Progress Notes (Signed)
Pt arrived to 4E06 from ED. VSS. No c/o SOB or pain (pain only with a deep breath). CHG bath completed. Tele placed verified x2. Pt updated on plan of care. Will update day shift RN.

## 2018-10-22 NOTE — Progress Notes (Signed)
PROGRESS NOTE    Gordon Novak  PJK:[id number] DOB: [birth year] DOA: 10/22/2018 PCP: [name], MD   Brief Narrative:  84 year old with history of CAD, diabetes mellitus type 2, gout, renal stone, gout, essential hypertension, recent STEMI discharged 2 days ago coming in with right-sided pleuritic chest pain.  Echo 5/24 showed ejection fraction 35%.  Left heart catheterization 5/24-successful PCI to RCA and drug-eluting stent.  Recommended aspirin/Brilinta for at least 1 year.  CTA chest in the ER showed acute bilateral pulmonary embolism involving segmental and subsegmental region without heart strain.  [facility]urb s[facility] cardiology, Dr [name], about this who recommends triple therapy (DAPT + NOAC) at least for a month and further plan to be determined thereafter.   Assessment & Plan:   Principal Problem:   Pulmonary embolism (HCC) Active Problems:   Gout   Type 2 diabetes mellitus with vascular disease (HCC)   CAD (coronary artery disease)  Acute bilateral pulmonary embolism involving segmental and subsegmental without cor pulmonale - Lower extremity Dopplers ordered- pending -Spoke extensively with the patient regarding options for anticoagulation and he opts for Xarelto.  We will transition him to Xarelto prior to discharge. - Also spoke with patient's cardiology team, Dr. [name] who recommends continuing his dual antiplatelet therapy and adding NOAC.  Because of his recent stent this has to be triple therapy for at least 1 month and further determination will be made thereafter.  Coronary artery disease with recent ST elevation MI status post PCI -Drug-eluting stent placed about 2 days ago.  Continue dual antiplatelet therapy.  Currently he is chest pain-free.  Chronic congestive heart failure with reduced ejection fraction, 35% -Currently appears to be well compensated and euvolemic.  Resume his home medications including Lasix and ACE inhibitor.  History of gout -On  allopurinol  Diabetes mellitus type 2 -Accu-Chek and sliding scale    DVT prophylaxis: Heparin Code Status: Full code Family Communication: None at bedside Disposition Plan: Maintain at least another day of hospitalization for complete work-up for his bilateral PE.  Consultants:   Cardiology-curbside  Procedures:   None  Antimicrobials:   None   Subjective: Patient reports he feels better in terms of chest discomfort.  No other complaints at this time.  Had an extensive evaluation and discussion with the patient regarding long-term anticoagulation and he opts to be on Xarelto  Review of Systems Otherwise negative except as per HPI, including: General: Denies fever, chills, night sweats or unintended weight loss. Resp: Denies cough, wheezing, shortness of breath. Cardiac: Denies chest pain, palpitations, orthopnea, paroxysmal nocturnal dyspnea. GI: Denies abdominal pain, nausea, vomiting, diarrhea or constipation GU: Denies dysuria, frequency, hesitancy or incontinence MS: Denies muscle aches, joint pain or swelling Neuro: Denies headache, neurologic deficits (focal weakness, numbness, tingling), abnormal gait Psych: Denies anxiety, depression, SI/HI/AVH Skin: Denies new rashes or lesions ID: Denies sick contacts, exotic exposures, travel   Objective: Vitals:   10/22/18 0345 10/22/18 0622 10/22/18 0633 10/22/18 0635  BP: 117/76 (!) 125/56  (!) 113/48  Pulse: 81 77  82  Resp: 16 18  14   Temp:    98.1 F (36.7 C)  TempSrc:    Oral  SpO2: 99% 96%  96%  Weight:   81.8 kg   Height:   6' (1.829 m)    No intake or output data in the 24 hours ending 10/22/18 0740 Filed Weights   10/22/18 0038 10/22/18 0633  Weight: 81.2 kg 81.8 kg    Examination:  General exam: Appears  calm and comfortable, elderly frail-appearing Respiratory system: Clear to auscultation. Respiratory effort normal. Cardiovascular system: S1 & S2 heard, RRR. No JVD, murmurs, rubs, gallops or  clicks. No pedal edema. Gastrointestinal system: Abdomen is nondistended, soft and nontender. No organomegaly or masses felt. Normal bowel sounds heard. Central nervous system: Alert and oriented. No focal neurological deficits. Extremities: Symmetric 5 x 5 power. Skin: No rashes, lesions or ulcers Psychiatry: Judgement and insight appear normal. Mood & affect appropriate.     Data Reviewed:   CBC: Recent Labs  Lab 10/18/18 0625 10/19/18 0238 10/22/18 0117  WBC 7.4 7.9 9.2  HGB 17.1* 15.0 14.9  HCT 52.1* 44.7 44.2  MCV 96.3 94.3 94.2  PLT 195 168 191   Basic Metabolic Panel: Recent Labs  Lab 10/18/18 0625 10/19/18 0238 10/22/18 0117  NA 144 142 139  K 4.2 3.7 4.0  CL 103 105 104  CO2 GLUCOSE 163* 113* 133*  BUN 18 17 27*  CREATININE 1.21 1.01 1.10  CALCIUM 9.9 8.8* 8.9   GFR: Estimated Creatinine Clearance: 57.8 mL/min (by C-G formula based on SCr of 1.1 mg/dL). Liver Function Tests: Recent Labs  Lab 10/22/18 0117  AST 34  ALT 31  ALKPHOS 101  BILITOT 1.2  PROT 5.8*  ALBUMIN 3.3*   No results for input(s): LIPASE, AMYLASE in the last 168 hours. No results for input(s): AMMONIA in the last 168 hours. Coagulation Profile: Recent Labs  Lab 10/18/18 0851  INR 1.0   Cardiac Enzymes: Recent Labs  Lab 10/18/18 0625 10/19/18 0938 10/19/18 1417 10/19/18 1958 10/22/18 0117  TROPONINI <0.03 0.93* 0.94* 0.84* 0.20*   BNP (last 3 results) No results for input(s): PROBNP in the last 8760 hours. HbA1C: No results for input(s): HGBA1C in the last 72 hours. CBG: No results for input(s): GLUCAP in the last 168 hours. Lipid Profile: No results for input(s): CHOL, HDL, LDLCALC, TRIG, CHOLHDL, LDLDIRECT in the last 72 hours. Thyroid Function Tests: No results for input(s): TSH, T4TOTAL, FREET4, T3FREE, THYROIDAB in the last 72 hours. Anemia Panel: No results for input(s): VITAMINB12, FOLATE, FERRITIN, TIBC, IRON, RETICCTPCT in the last 72  hours. Sepsis Labs: No results for input(s): PROCALCITON, LATICACIDVEN in the last 168 hours.  Recent Results (from the past 240 hour(s))  SARS Coronavirus 2 (CEPHEID - Performed in [facility] hospital lab), Hosp Order     Status: None   Collection Time: 10/18/18  8:40 AM  Result Value Ref Range Status   SARS Coronavirus 2 NEGATIVE NEGATIVE Final    Comment: (NOTE) If result is NEGATIVE SARS-CoV-2 target nucleic acids are NOT DETECTED. The SARS-CoV-2 RNA is generally detectable in upper and lower  respiratory specimens during the acute phase of infection. The lowest  concentration of SARS-CoV-2 viral copies this assay can detect is 250  copies / mL. A negative result does not preclude SARS-CoV-2 infection  and should not be used as the sole basis for treatment or other  patient management decisions.  A negative result may occur with  improper specimen collection / handling, submission of specimen other  than nasopharyngeal swab, presence of viral mutation(s) within the  areas targeted by this assay, and inadequate number of viral copies  (<250 copies / mL). A negative result must be combined with clinical  observations, patient history, and epidemiological information. If result is POSITIVE SARS-CoV-2 target nucleic acids are DETECTED. The SARS-CoV-2 RNA is generally detectable in upper and lower  respiratory specimens dur ing the acute  phase of infection.  Positive  results are indicative of active infection with SARS-CoV-2.  Clinical  correlation with patient history and other diagnostic information is  necessary to determine patient infection status.  Positive results do  not rule out bacterial infection or co-infection with other viruses. If result is PRESUMPTIVE POSTIVE SARS-CoV-2 nucleic acids MAY BE PRESENT.   A presumptive positive result was obtained on the submitted specimen  and confirmed on repeat testing.  While 2019 novel coronavirus  (SARS-CoV-2) nucleic acids may  be present in the submitted sample  additional confirmatory testing may be necessary for epidemiological  and / or clinical management purposes  to differentiate between  SARS-CoV-2 and other Sarbecovirus currently known to infect humans.  If clinically indicated additional testing with an alternate test  methodology [phone]) is advised. The SARS-CoV-2 RNA is generally  detectable in upper and lower respiratory sp ecimens during the acute  phase of infection. The expected result is Negative. Fact Sheet for Patients:  BoilerBrush.com.cy Fact Sheet for Healthcare Providers: https://pope.com/ This test is not yet approved or cleared by the [place] FDA and has been authorized for detection and/or diagnosis of SARS-CoV-2 by FDA under an Emergency Use Authorization (EUA).  This EUA will remain in effect (meaning this test can be used) for the duration of the COVID-19 declaration under Section 564(b)(1) of the Act, 21 U.S.C. section 360bbb-3(b)(1), unless the authorization is terminated or revoked sooner. Performed at [facility], [address], [place], [place] [address]   SARS Coronavirus 2 (CEPHEID - Performed in [facility] hospital lab), Hosp Order     Status: None   Collection Time: 10/22/18  5:52 AM  Result Value Ref Range Status   SARS Coronavirus 2 NEGATIVE NEGATIVE Final    Comment: (NOTE) If result is NEGATIVE SARS-CoV-2 target nucleic acids are NOT DETECTED. The SARS-CoV-2 RNA is generally detectable in upper and lower  respiratory specimens during the acute phase of infection. The lowest  concentration of SARS-CoV-2 viral copies this assay can detect is 250  copies / mL. A negative result does not preclude SARS-CoV-2 infection  and should not be used as the sole basis for treatment or other  patient management decisions.  A negative result may occur with  improper specimen collection / handling, submission of  specimen other  than nasopharyngeal swab, presence of viral mutation(s) within the  areas targeted by this assay, and inadequate number of viral copies  (<250 copies / mL). A negative result must be combined with clinical  observations, patient history, and epidemiological information. If result is POSITIVE SARS-CoV-2 target nucleic acids are DETECTED. The SARS-CoV-2 RNA is generally detectable in upper and lower  respiratory specimens dur ing the acute phase of infection.  Positive  results are indicative of active infection with SARS-CoV-2.  Clinical  correlation with patient history and other diagnostic information is  necessary to determine patient infection status.  Positive results do  not rule out bacterial infection or co-infection with other viruses. If result is PRESUMPTIVE POSTIVE SARS-CoV-2 nucleic acids MAY BE PRESENT.   A presumptive positive result was obtained on the submitted specimen  and confirmed on repeat testing.  While 2019 novel coronavirus  (SARS-CoV-2) nucleic acids may be present in the submitted sample  additional confirmatory testing may be necessary for epidemiological  and / or clinical management purposes  to differentiate between  SARS-CoV-2 and other Sarbecovirus currently known to infect humans.  If clinically indicated additional testing with an alternate  test  methodology [phone] is advised. The SARS-CoV-2 RNA is generally  detectable in upper and lower respiratory sp ecimens during the acute  phase of infection. The expected result is Negative. Fact Sheet for Patients:  BoilerBrush.com.cy Fact Sheet for Healthcare Providers: https://pope.com/ This test is not yet approved or cleared by the [place] FDA and has been authorized for detection and/or diagnosis of SARS-CoV-2 by FDA under an Emergency Use Authorization (EUA).  This EUA will remain in effect (meaning this test can be used) for the  duration of the COVID-19 declaration under Section 564(b)(1) of the Act, 21 U.S.C. section 360bbb-3(b)(1), unless the authorization is terminated or revoked sooner. Performed at [facility], [address] [place], [place] [address]          Radiology Studies: Dg Chest 2 View  Result Date: 10/22/2018 CLINICAL DATA:  Chest pain for 1 day particularly on the right EXAM: CHEST - 2 VIEW COMPARISON:  10/18/2018 FINDINGS: Cardiac shadows within normal limits. Aortic calcifications are seen. The lungs are well aerated bilaterally. No focal infiltrate or sizable effusion is seen. No bony abnormality is noted. IMPRESSION: No acute abnormality seen. Electronically Signed   By: Alcide Clever M.D.   On: 10/22/2018 01:34   Ct Angio Chest Pe W And/or Wo Contrast  Result Date: 10/22/2018 CLINICAL DATA:  Dyspnea. EXAM: CT ANGIOGRAPHY CHEST WITH CONTRAST TECHNIQUE: Multidetector CT imaging of the chest was performed using the standard protocol during bolus administration of intravenous contrast. Multiplanar CT image reconstructions and MIPs were obtained to evaluate the vascular anatomy. CONTRAST:  80mL OMNIPAQUE IOHEXOL 350 MG/ML SOLN COMPARISON:  None. FINDINGS: Cardiovascular: Normal heart size. No pericardial effusion. There is aortic and coronary atherosclerosis. Positive for multifocal segmental and subsegmental pulmonary emboli seen in both lower lobes and the right middle lobe. No right heart dilatation. Mediastinum/Nodes: Negative for adenopathy or mass. Lungs/Pleura: Lower lobe ground-glass opacity primarily attributed atelectasis, although there may be an element of pulmonary ischemia. No pulmonary edema or pleural effusion. 4 mm right upper lobe pulmonary nodule, too small for follow-up purposes. Upper Abdomen: Multiple calcified gallstones.  No acute finding Musculoskeletal: Spondylosis. There is multilevel thoracic ankylosis. No acute osseous finding. Delay in reporting due to system wide  outage that has just completed. Critical Value/emergent results were called by telephone at the time of interpretation on 10/22/2018 at 5:17 am to Dr. Drema Pry , who verbally acknowledged these results. Review of the MIP images confirms the above findings. IMPRESSION: Positive for multifocal segmental and subsegmental pulmonary emboli. No right heart dilatation. Atherosclerosis and cholelithiasis. Electronically Signed   By: Marnee Spring M.D.   On: 10/22/2018 05:17        Scheduled Meds:  allopurinol  300 mg Oral Daily   aspirin EC  81 mg Oral Daily   atorvastatin  80 mg Oral q1800   bisacodyl  5 mg Oral Daily   docusate sodium  100 mg Oral QHS   furosemide  20 mg Oral Daily   insulin aspart  0-9 Units Subcutaneous TID WC   lisinopril  10 mg Oral Daily   metoprolol tartrate  12.5 mg Oral BID   multivitamin  1 tablet Oral Daily   multivitamin with minerals  1 tablet Oral QPM   potassium chloride SA  20 mEq Oral Daily   prednisoLONE acetate  1 drop Both Eyes QHS   tamsulosin  0.4 mg Oral Daily   ticagrelor  90 mg Oral BID   Continuous Infusions:  heparin 1,450 Units/hr (10/22/18 0546)     LOS: 0 days   Time spent= 30 mins    Travante Knee Joline Maxcyhirag Tanaja Ganger, MD [facility]  If 7PM-7AM, please contact night-coverage [email or web address] 10/22/2018, 7:40 AM

## 2018-10-22 NOTE — ED Triage Notes (Signed)
Pt presents to the ED w/ c/o pain on the right side when breathing. Pt had a stent placement on 10/18/2018 through [facility].

## 2018-10-22 NOTE — Care Management (Signed)
Noted patient on Xaralto. Explained 30 day card to patient, and placed it in his white hospital belonging bag. Spoke w wife over the phone, explained coupon, and copay and out of pocket cost due to deductible not being met.

## 2018-10-23 ENCOUNTER — Telehealth: Payer: Self-pay | Admitting: Adult Health

## 2018-10-23 DIAGNOSIS — I2699 Other pulmonary embolism without acute cor pulmonale: Secondary | ICD-10-CM | POA: Diagnosis not present

## 2018-10-23 DIAGNOSIS — M1000 Idiopathic gout, unspecified site: Secondary | ICD-10-CM | POA: Diagnosis not present

## 2018-10-23 DIAGNOSIS — I2510 Atherosclerotic heart disease of native coronary artery without angina pectoris: Secondary | ICD-10-CM | POA: Diagnosis not present

## 2018-10-23 DIAGNOSIS — I2694 Multiple subsegmental pulmonary emboli without acute cor pulmonale: Secondary | ICD-10-CM | POA: Diagnosis not present

## 2018-10-23 DIAGNOSIS — E1159 Type 2 diabetes mellitus with other circulatory complications: Secondary | ICD-10-CM | POA: Diagnosis not present

## 2018-10-23 LAB — CBC
HCT: 41.3 % (ref 39.0–52.0)
HCT: 41 % (ref 39.0–52.0)
Hemoglobin: 13.8 g/dL (ref 13.0–17.0)
Hemoglobin: 13.9 g/dL (ref 13.0–17.0)
MCH: 31.4 pg (ref 26.0–34.0)
MCH: 31.8 pg (ref 26.0–34.0)
MCHC: 33.4 g/dL (ref 30.0–36.0)
MCHC: 33.9 g/dL (ref 30.0–36.0)
MCV: 94.1 fL (ref 80.0–100.0)
MCV: 93.8 fL (ref 80.0–100.0)
Platelets: 178 10*3/uL (ref 150–400)
Platelets: 179 10*3/uL (ref 150–400)
RBC: 4.39 MIL/uL (ref 4.22–5.81)
RBC: 4.37 MIL/uL (ref 4.22–5.81)
RDW: 13.4 % (ref 11.5–15.5)
RDW: 13.3 % (ref 11.5–15.5)
WBC: 8.1 10*3/uL (ref 4.0–10.5)
WBC: 7.8 10*3/uL (ref 4.0–10.5)
nRBC: 0 % (ref 0.0–0.2)
nRBC: 0 % (ref 0.0–0.2)

## 2018-10-23 LAB — BASIC METABOLIC PANEL
Anion gap: 9 (ref 5–15)
BUN: 21 mg/dL (ref 8–23)
CO2: 23 mmol/L (ref 22–32)
Calcium: 8.6 mg/dL — ABNORMAL LOW (ref 8.9–10.3)
Chloride: 107 mmol/L (ref 98–111)
Creatinine, Ser: 0.86 mg/dL (ref 0.61–1.24)
GFR calc Af Amer: >60 mL/min (ref >60)
GFR calc non Af Amer: >60 mL/min (ref >60)
Glucose, Bld: 117 mg/dL — ABNORMAL HIGH (ref 70–99)
Potassium: 3.8 mmol/L (ref 3.5–5.1)
Sodium: 139 mmol/L (ref 135–145)

## 2018-10-23 LAB — GLUCOSE, CAPILLARY
Glucose-Capillary: 120 mg/dL — ABNORMAL HIGH (ref 70–99)
Glucose-Capillary: 150 mg/dL — ABNORMAL HIGH (ref 70–99)

## 2018-10-23 LAB — HEPARIN LEVEL (UNFRACTIONATED): Heparin Unfractionated: 0.96 IU/mL — ABNORMAL HIGH (ref 0.30–0.70)

## 2018-10-23 MED ORDER — RIVAROXABAN (XARELTO) VTE STARTER PACK (15 & 20 MG): ORAL_TABLET | ORAL | 0 refills | Status: DC

## 2018-10-23 MED ORDER — RIVAROXABAN 15 MG PO TABS
15.0000 mg | ORAL_TABLET | Freq: Once | ORAL | Status: AC
  Administered 2018-10-23: 15 mg via ORAL
  Filled 2018-10-23: qty 1

## 2018-10-23 MED FILL — XARELTO STARTER PACK: 15 & 20 | 30 days supply | Qty: 51 | Fill #0

## 2018-10-23 NOTE — Progress Notes (Signed)
Brief cardiology note: I did not see the patient this admission, called to ask if triple therapy with DOAC acceptable with a recent STEMI.  Typically when we need to place patients on triple therapy, we will change them from ticagrelor/prasugrel to clopidogrel to improve the risk of bleeding. That would be my long term suggestion for this gentleman. However, with recent STEMI and complete occlusion of the mid RCA s/p DES, started on ticagrelor, and no active bleeding, continuing the ticagrelor short term as triple therapy is acceptable. As he is planned for discharge today, this change can be made as an outpatient.  My recommendation would be that at his cardiology outpatient visit on 6/8, he discuss loading with clopidogrel 300 mg at the time he is due for his next AM ticagrelor dose, then starting the following day continuing with clopidogrel 75 mg daily. He should contact use with any bleeding issues on this.  He needs to be on triple therapy (aspirin plus P2Y12 plus DOAC) for at least a month, and then depending on how he is doing, aspirin may be able to be discontinued. This can be evaluated on outpatient follow up.  He should be instructed to call the office with any issues with bleeding, as the adjustment to his regimen can be done sooner.  I will communicate this to Joni Reining, who he is seeing on 6/8  Jodelle Red, MD, PhD [facility]  [address] [place], [place] [address] [phone]

## 2018-10-23 NOTE — Discharge Instructions (Signed)
Information on my medicine - XARELTO (rivaroxaban)  This medication education was reviewed with me or my healthcare representative as part of my discharge preparation.  The pharmacist that spoke with me during my hospital stay was:  [facility], Maryanna Shape, [facility]  WHY WAS Carlena Hurl PRESCRIBED FOR YOU? Xarelto was prescribed to treat blood clots that may have been found in the veins of your legs (deep vein thrombosis) or in your lungs (pulmonary embolism) and to reduce the risk of them occurring again.  What do you need to know about Xarelto? The starting dose is one 15 mg tablet taken TWICE daily with food for the FIRST 21 DAYS then on 11/13/18  the dose is changed to one 20 mg tablet taken ONCE A DAY with your evening meal.  DO NOT stop taking Xarelto without talking to the health care provider who prescribed the medication.  Refill your prescription for 20 mg tablets before you run out.  After discharge, you should have regular check-up appointments with your healthcare provider that is prescribing your Xarelto.  In the future your dose may need to be changed if your kidney function changes by a significant amount.  What do you do if you miss a dose? If you are taking Xarelto TWICE DAILY and you miss a dose, take it as soon as you remember. You may take two 15 mg tablets (total 30 mg) at the same time then resume your regularly scheduled 15 mg twice daily the next day.  If you are taking Xarelto ONCE DAILY and you miss a dose, take it as soon as you remember on the same day then continue your regularly scheduled once daily regimen the next day. Do not take two doses of Xarelto at the same time.   Important Safety Information Xarelto is a blood thinner medicine that can cause bleeding. You should call your healthcare provider right away if you experience any of the following: ? Bleeding from an injury or your nose that does not stop. ? Unusual colored urine (red or dark brown) or  unusual colored stools (red or black). ? Unusual bruising for unknown reasons. ? A serious fall or if you hit your head (even if there is no bleeding).  Some medicines may interact with Xarelto and might increase your risk of bleeding while on Xarelto. To help avoid this, consult your healthcare provider or pharmacist prior to using any new prescription or non-prescription medications, including herbals, vitamins, non-steroidal anti-inflammatory drugs (NSAIDs) and supplements.  This website has more information on Xarelto: VisitDestination.com.br.

## 2018-10-23 NOTE — Telephone Encounter (Signed)
New Message    Gordon Novak is calling and has questions about the pts medications   Please call

## 2018-10-23 NOTE — Discharge Summary (Signed)
Physician Discharge Summary  Gordon Novak MWU:[id number] DOB: 05-01-1935 DOA: 10/22/2018  PCP: Gordon Banner, MD  Admit date: 10/22/2018 Discharge date: 10/23/2018  Admitted From: Home  Disposition:  Home   Recommendations for Outpatient Follow-up:  1. Follow up with PCP in 1-2 weeks 2. Please obtain BMP/CBC in one week your next doctors visit.  3. Xarelto Start Pack Prescribed.  4. In about 4 weeks determination of long-term treatment for dual antiplatelet therapy and Xarelto will be made.  This case was discussed with Dr. [name] from cardiology.  Home Health: None Equipment/Devices: None Discharge Condition: Stable CODE STATUS: Full code Diet recommendation: Cardiac  Brief/Interim Summary: 84 year old with history of CAD, diabetes mellitus type 2, gout, renal stone, gout, essential hypertension, recent STEMI discharged 2 days ago coming in with right-sided pleuritic chest pain.  Echo 5/24 showed ejection fraction 35%.  Left heart catheterization 5/24-successful PCI to RCA and drug-eluting stent.  Recommended aspirin/Brilinta for at least 1 year.  CTA chest in the ER showed acute bilateral pulmonary embolism involving segmental and subsegmental region without heart strain.  Curb sided cardiology, Dr [name], about this who recommends triple therapy (DAPT + NOAC) at least for a month and further plan to be determined thereafter.  After prolonged discussion with the family members and the patient, they have opted to be treated with Xarelto.  With the assistance of case manager and pharmacy this medication was started.  He was provided prescription medication cost along with first month free supply card.  Otherwise patient is medically stable to be discharged.  Needs to follow-up with outpatient primary care cardiology in about 2 weeks so further determination for his dual antiplatelet therapy and Xarelto can be made.  He also needs to follow-up with the primary care provider in about 2  weeks.   Discharge Diagnoses:  Principal Problem:   Pulmonary embolism (HCC) Active Problems:   Gout   Type 2 diabetes mellitus with vascular disease (HCC)   CAD (coronary artery disease)  Acute bilateral pulmonary embolism involving segmental and subsegmental without cor pulmonale - Lower extremity Dopplers-negative for lower extremity bilateral DVT -After extensive discussion with the patient and the family member they determined to go home on Xarelto.  They are aware of out-of-pocket expenses.  Appreciate input from case management and pharmacy. - Also spoke with patient's cardiology team, Dr. [name] who recommends continuing his dual antiplatelet therapy and adding NOAC.  Because of his recent stent this has to be triple therapy for at least 1 month and further determination will be made thereafter.  Coronary artery disease with recent ST elevation MI status post PCI -Drug-eluting stent placed about 2 days ago.  Continue dual antiplatelet therapy.  Currently he is chest pain-free.  Chronic congestive heart failure with reduced ejection fraction, 35% -Currently appears to be well compensated and euvolemic.  Resume his home medications including Lasix and ACE inhibitor.  History of gout -On allopurinol  Diabetes mellitus type 2 -Accu-Chek and sliding scale   Consultations:  None  Subjective: Patient feels much better and wishes to go home.  Denies any shortness of breath.  He is ambulating in the hallway on room air without any issues.  Discharge Exam: Vitals:   10/23/18 0350 10/23/18 0750  BP:  (!) 105/57  Pulse:  72  Resp: 20   Temp: 97.8 F (36.6 C) 97.9 F (36.6 C)  SpO2: 95% 94%   Vitals:   10/23/18 0000 10/23/18 0018 10/23/18 0350 10/23/18 0750  BP:  Marland Kitchen)  115/59  (!) 105/57  Pulse:    72  Resp: Temp:  97.9 F (36.6 C) 97.8 F (36.6 C) 97.9 F (36.6 C)  TempSrc:  Oral Oral Oral  SpO2:   95% 94%  Weight:      Height:         General: Pt is alert, awake, not in acute distress Cardiovascular: RRR, S1/S2 +, no rubs, no gallops Respiratory: CTA bilaterally, no wheezing, no rhonchi Abdominal: Soft, NT, ND, bowel sounds + Extremities: no edema, no cyanosis  Discharge Instructions  Discharge Instructions    Diet - low sodium heart healthy   Complete by:  As directed    Increase activity slowly   Complete by:  As directed      Allergies as of 10/23/2018   No Known Allergies     Medication List    TAKE these medications   allopurinol 300 MG tablet Commonly known as:  ZYLOPRIM Take 300 mg by mouth daily.   aspirin 81 MG EC tablet Take 1 tablet (81 mg total) by mouth daily.   atorvastatin 80 MG tablet Commonly known as:  LIPITOR Take 1 tablet (80 mg total) by mouth daily at 6 PM.   beta carotene w/minerals tablet Take 1 tablet by mouth daily.   bisacodyl 5 MG EC tablet Commonly known as:  DULCOLAX Take 1 tablet (5 mg total) by mouth daily.   DSS 100 MG Caps Take 100 mg by mouth 2 (two) times daily. What changed:  when to take this   furosemide 20 MG tablet Commonly known as:  LASIX Take 20 mg by mouth daily.   lisinopril 10 MG tablet Commonly known as:  ZESTRIL Take 10 mg by mouth daily.   metFORMIN 500 MG 24 hr tablet Commonly known as:  GLUCOPHAGE-XR Take 500 mg by mouth daily with breakfast.   metoprolol tartrate 25 MG tablet Commonly known as:  LOPRESSOR Take 0.5 tablets (12.5 mg total) by mouth 2 (two) times daily.   multivitamin with minerals Tabs tablet Take 1 tablet by mouth every evening.   nitroGLYCERIN 0.4 MG SL tablet Commonly known as:  NITROSTAT Place 1 tablet (0.4 mg total) under the tongue every 5 (five) minutes x 3 doses as needed for chest pain.   oxyCODONE-acetaminophen 5-325 MG tablet Commonly known as:  PERCOCET/ROXICET Take 2 tablets by mouth every 6 (six) hours as needed for severe pain.   potassium chloride SA 20 MEQ tablet Commonly known as:   K-DUR Take 20 mEq by mouth daily.   prednisoLONE acetate 1 % ophthalmic suspension Commonly known as:  PRED FORTE Place 1 drop into both eyes at bedtime.   Proctofoam HC rectal foam Generic drug:  hydrocortisone-pramoxine Place 1 applicator rectally 3 (three) times daily as needed for hemorrhoids or itching.   Rivaroxaban 15 & 20 MG Tbpk TStart with one  tablet by mouth twice a day with food. On Day 22, switch to one  tablet once a day with food.   tamsulosin 0.4 MG Caps capsule Commonly known as:  FLOMAX Take 2 capsules (0.8 mg total) by mouth daily. What changed:  how much to take   ticagrelor 90 MG Tabs tablet Commonly known as:  BRILINTA Take 1 tablet (90 mg total) by mouth 2 (two) times daily.   valACYclovir 500 MG tablet Commonly known as:  VALTREX Take 500 mg by mouth 2 (two) times daily as needed (cold sores).       No  Known Allergies  You were cared for by a hospitalist during your hospital stay. If you have any questions about your discharge medications or the care you received while you were in the hospital after you are discharged, you can call the unit and asked to speak with the hospitalist on call if the hospitalist that took care of you is not available. Once you are discharged, your primary care physician will handle any further medical issues. Please note that no refills for any discharge medications will be authorized once you are discharged, as it is imperative that you return to your primary care physician (or establish a relationship with a primary care physician if you do not have one) for your aftercare needs so that they can reassess your need for medications and monitor your lab values.   Procedures/Studies: Dg Chest 2 View  Result Date: 10/22/2018 CLINICAL DATA:  Chest pain for 1 day particularly on the right EXAM: CHEST - 2 VIEW COMPARISON:  10/18/2018 FINDINGS: Cardiac shadows within normal limits. Aortic calcifications are seen. The lungs  are well aerated bilaterally. No focal infiltrate or sizable effusion is seen. No bony abnormality is noted. IMPRESSION: No acute abnormality seen. Electronically Signed   By: Alcide Clever M.D.   On: 10/22/2018 01:34   Dg Chest 2 View  Result Date: 10/18/2018 CLINICAL DATA:  Chest pain EXAM: CHEST - 2 VIEW COMPARISON:  05/12/2014 FINDINGS: The heart size and mediastinal contours are within normal limits. Both lungs are clear. The visualized skeletal structures are unremarkable. IMPRESSION: No active cardiopulmonary disease. Electronically Signed   By: Marlan Palau M.D.   On: 10/18/2018 07:02   Ct Angio Chest Pe W And/or Wo Contrast  Result Date: 10/22/2018 CLINICAL DATA:  Dyspnea. EXAM: CT ANGIOGRAPHY CHEST WITH CONTRAST TECHNIQUE: Multidetector CT imaging of the chest was performed using the standard protocol during bolus administration of intravenous contrast. Multiplanar CT image reconstructions and MIPs were obtained to evaluate the vascular anatomy. CONTRAST:  80mL OMNIPAQUE IOHEXOL 350 MG/ML SOLN COMPARISON:  None. FINDINGS: Cardiovascular: Normal heart size. No pericardial effusion. There is aortic and coronary atherosclerosis. Positive for multifocal segmental and subsegmental pulmonary emboli seen in both lower lobes and the right middle lobe. No right heart dilatation. Mediastinum/Nodes: Negative for adenopathy or mass. Lungs/Pleura: Lower lobe ground-glass opacity primarily attributed atelectasis, although there may be an element of pulmonary ischemia. No pulmonary edema or pleural effusion. 4 mm right upper lobe pulmonary nodule, too small for follow-up purposes. Upper Abdomen: Multiple calcified gallstones.  No acute finding Musculoskeletal: Spondylosis. There is multilevel thoracic ankylosis. No acute osseous finding. Delay in reporting due to system wide outage that has just completed. Critical Value/emergent results were called by telephone at the time of interpretation on 10/22/2018 at 5:17  am to Dr. Drema Pry , who verbally acknowledged these results. Review of the MIP images confirms the above findings. IMPRESSION: Positive for multifocal segmental and subsegmental pulmonary emboli. No right heart dilatation. Atherosclerosis and cholelithiasis. Electronically Signed   By: Marnee Spring M.D.   On: 10/22/2018 05:17   Vas [place] Lower Extremity Venous (dvt)  Result Date: 10/22/2018  Lower Venous Study Risk Factors: Confirmed PE. Performing Technologist: Jeb Levering RDMS, RVT  Examination Guidelines: A complete evaluation includes B-mode imaging, spectral Doppler, color Doppler, and power Doppler as needed of all accessible portions of each vessel. Bilateral testing is considered an integral part of a complete examination. Limited examinations for reoccurring indications may be performed as noted.  +---------+---------------+---------+-----------+----------+-------+ RIGHT  CompressibilityPhasicitySpontaneityPropertiesSummary +---------+---------------+---------+-----------+----------+-------+ CFV      Full           Yes      Yes                          +---------+---------------+---------+-----------+----------+-------+ SFJ      Full                                                 +---------+---------------+---------+-----------+----------+-------+ FV Prox  Full                                                 +---------+---------------+---------+-----------+----------+-------+ FV Mid   Full                                                 +---------+---------------+---------+-----------+----------+-------+ FV DistalFull                                                 +---------+---------------+---------+-----------+----------+-------+ PFV      Full                                                 +---------+---------------+---------+-----------+----------+-------+ POP      Full           Yes      Yes                           +---------+---------------+---------+-----------+----------+-------+ PTV      Full                                                 +---------+---------------+---------+-----------+----------+-------+ PERO     Full                                                 +---------+---------------+---------+-----------+----------+-------+   +---------+---------------+---------+-----------+----------+-------+ LEFT     CompressibilityPhasicitySpontaneityPropertiesSummary +---------+---------------+---------+-----------+----------+-------+ CFV      Full           Yes      Yes                          +---------+---------------+---------+-----------+----------+-------+ SFJ      Full                                                 +---------+---------------+---------+-----------+----------+-------+  FV Prox  Full                                                 +---------+---------------+---------+-----------+----------+-------+ FV Mid   Full                                                 +---------+---------------+---------+-----------+----------+-------+ FV DistalFull                                                 +---------+---------------+---------+-----------+----------+-------+ PFV      Full                                                 +---------+---------------+---------+-----------+----------+-------+ POP      Full           Yes      Yes                          +---------+---------------+---------+-----------+----------+-------+ PTV      Full                                                 +---------+---------------+---------+-----------+----------+-------+ PERO     Full                                                 +---------+---------------+---------+-----------+----------+-------+     Summary: Right: There is no evidence of deep vein thrombosis in the lower extremity. No cystic structure found in the popliteal fossa. Left: There is  no evidence of deep vein thrombosis in the lower extremity. No cystic structure found in the popliteal fossa.  *See table(s) above for measurements and observations. Electronically signed by Lemar Livings MD on 10/22/2018 at 3:31:33 PM.    Final       The results of significant diagnostics from this hospitalization (including imaging, microbiology, ancillary and laboratory) are listed below for reference.     Microbiology: Recent Results (from the past 240 hour(s))  SARS Coronavirus 2 (CEPHEID - Performed in [facility] hospital lab), Hosp Order     Status: None   Collection Time: 10/18/18  8:40 AM  Result Value Ref Range Status   SARS Coronavirus 2 NEGATIVE NEGATIVE Final    Comment: (NOTE) If result is NEGATIVE SARS-CoV-2 target nucleic acids are NOT DETECTED. The SARS-CoV-2 RNA is generally detectable in upper and lower  respiratory specimens during the acute phase of infection. The lowest  concentration of SARS-CoV-2 viral copies this assay can detect is 250  copies / mL. A negative result does not preclude SARS-CoV-2 infection  and should not be used as the sole basis for treatment or other  patient management decisions.  A negative result may occur with  improper specimen collection / handling, submission of specimen other  than nasopharyngeal swab, presence of viral mutation(s) within the  areas targeted by this assay, and inadequate number of viral copies  (<250 copies / mL). A negative result must be combined with clinical  observations, patient history, and epidemiological information. If result is POSITIVE SARS-CoV-2 target nucleic acids are DETECTED. The SARS-CoV-2 RNA is generally detectable in upper and lower  respiratory specimens dur ing the acute phase of infection.  Positive  results are indicative of active infection with SARS-CoV-2.  Clinical  correlation with patient history and other diagnostic information is  necessary to determine patient infection status.   Positive results do  not rule out bacterial infection or co-infection with other viruses. If result is PRESUMPTIVE POSTIVE SARS-CoV-2 nucleic acids MAY BE PRESENT.   A presumptive positive result was obtained on the submitted specimen  and confirmed on repeat testing.  While 2019 novel coronavirus  (SARS-CoV-2) nucleic acids may be present in the submitted sample  additional confirmatory testing may be necessary for epidemiological  and / or clinical management purposes  to differentiate between  SARS-CoV-2 and other Sarbecovirus currently known to infect humans.  If clinically indicated additional testing with an alternate test  methodology [phone]) is advised. The SARS-CoV-2 RNA is generally  detectable in upper and lower respiratory sp ecimens during the acute  phase of infection. The expected result is Negative. Fact Sheet for Patients:  BoilerBrush.com.cy Fact Sheet for Healthcare Providers: https://pope.com/ This test is not yet approved or cleared by the [place] FDA and has been authorized for detection and/or diagnosis of SARS-CoV-2 by FDA under an Emergency Use Authorization (EUA).  This EUA will remain in effect (meaning this test can be used) for the duration of the COVID-19 declaration under Section 564(b)(1) of the Act, 21 U.S.C. section 360bbb-3(b)(1), unless the authorization is terminated or revoked sooner. Performed at [facility], [address], [place], [place] [address]   SARS Coronavirus 2 (CEPHEID - Performed in [facility] hospital lab), Hosp Order     Status: None   Collection Time: 10/22/18  5:52 AM  Result Value Ref Range Status   SARS Coronavirus 2 NEGATIVE NEGATIVE Final    Comment: (NOTE) If result is NEGATIVE SARS-CoV-2 target nucleic acids are NOT DETECTED. The SARS-CoV-2 RNA is generally detectable in upper and lower  respiratory specimens during the acute phase of infection. The lowest   concentration of SARS-CoV-2 viral copies this assay can detect is 250  copies / mL. A negative result does not preclude SARS-CoV-2 infection  and should not be used as the sole basis for treatment or other  patient management decisions.  A negative result may occur with  improper specimen collection / handling, submission of specimen other  than nasopharyngeal swab, presence of viral mutation(s) within the  areas targeted by this assay, and inadequate number of viral copies  (<250 copies / mL). A negative result must be combined with clinical  observations, patient history, and epidemiological information. If result is POSITIVE SARS-CoV-2 target nucleic acids are DETECTED. The SARS-CoV-2 RNA is generally detectable in upper and lower  respiratory specimens dur ing the acute phase of infection.  Positive  results are indicative of active infection with SARS-CoV-2.  Clinical  correlation with patient history and other diagnostic information is  necessary to determine patient infection status.  Positive results do  not rule out bacterial infection  or co-infection with other viruses. If result is PRESUMPTIVE POSTIVE SARS-CoV-2 nucleic acids MAY BE PRESENT.   A presumptive positive result was obtained on the submitted specimen  and confirmed on repeat testing.  While 2019 novel coronavirus  (SARS-CoV-2) nucleic acids may be present in the submitted sample  additional confirmatory testing may be necessary for epidemiological  and / or clinical management purposes  to differentiate between  SARS-CoV-2 and other Sarbecovirus currently known to infect humans.  If clinically indicated additional testing with an alternate test  methodology [phone]) is advised. The SARS-CoV-2 RNA is generally  detectable in upper and lower respiratory sp ecimens during the acute  phase of infection. The expected result is Negative. Fact Sheet for Patients:  BoilerBrush.com.cy Fact Sheet  for Healthcare Providers: https://pope.com/ This test is not yet approved or cleared by the [place] FDA and has been authorized for detection and/or diagnosis of SARS-CoV-2 by FDA under an Emergency Use Authorization (EUA).  This EUA will remain in effect (meaning this test can be used) for the duration of the COVID-19 declaration under Section 564(b)(1) of the Act, 21 U.S.C. section 360bbb-3(b)(1), unless the authorization is terminated or revoked sooner. Performed at [facility], [address] [place], [place] [address]      Labs: BNP (last 3 results) Recent Labs    10/22/18 0117  BNP 24.6   Basic Metabolic Panel: Recent Labs  Lab 10/18/18 0625 10/19/18 0238 10/22/18 0117 10/23/18 0333  NA 144 142 139 139  K 4.2 3.7 4.0 3.8  CL 103 105 104 107  CO2 26 29 23 23   GLUCOSE 163* 113* 133* 117*  BUN 18 17 27* 21  CREATININE 1.21 1.01 1.10 0.86  CALCIUM 9.9 8.8* 8.9 8.6*   Liver Function Tests: Recent Labs  Lab 10/22/18 0117 10/22/18 0717  AST 34 29  ALT 31 29  ALKPHOS 101 97  BILITOT 1.2 1.1  PROT 5.8* 5.5*  ALBUMIN 3.3* 3.1*   No results for input(s): LIPASE, AMYLASE in the last 168 hours. No results for input(s): AMMONIA in the last 168 hours. CBC: Recent Labs  Lab 10/18/18 0625 10/19/18 0238 10/22/18 0117 10/23/18 0333 10/23/18 0739  WBC 7.4 7.9 9.2 8.1 7.8  HGB 17.1* 15.0 14.9 13.8 13.9  HCT 52.1* 44.7 44.2 41.3 41.0  MCV 96.3 94.3 94.2 94.1 93.8  PLT 195 168 191 178 179   Cardiac Enzymes: Recent Labs  Lab 10/19/18 1958 10/22/18 0117 10/22/18 0717 10/22/18 1235 10/22/18 1835  TROPONINI 0.84* 0.20* 0.17* 0.14* 0.13*   BNP: Invalid input(s): POCBNP CBG: Recent Labs  Lab 10/22/18 1139 10/22/18 1652 10/22/18 2131 10/23/18 0616  GLUCAP 187* 108* 128* 120*   D-Dimer Recent Labs    10/22/18 0117  DDIMER 2.57*   Hgb A1c No results for input(s): HGBA1C in the last 72 hours. Lipid Profile No  results for input(s): CHOL, HDL, LDLCALC, TRIG, CHOLHDL, LDLDIRECT in the last 72 hours. Thyroid function studies No results for input(s): TSH, T4TOTAL, T3FREE, THYROIDAB in the last 72 hours.  Invalid input(s): FREET3 Anemia work up No results for input(s): VITAMINB12, FOLATE, FERRITIN, TIBC, IRON, RETICCTPCT in the last 72 hours. Urinalysis    Component Value Date/Time   COLORURINE AMBER (A) 05/12/2014 0718   APPEARANCEUR CLOUDY (A) 05/12/2014 0718   LABSPEC 1.020 05/12/2014 0718   PHURINE 6.0 05/12/2014 0718   GLUCOSEU NEGATIVE 05/12/2014 0718   HGBUR LARGE (A) 05/12/2014 0718   BILIRUBINUR SMALL (A) 05/12/2014 8119   [place] Atlas  15 (A) 05/12/2014 0718   PROTEINUR 100 (A) 05/12/2014 0718   UROBILINOGEN 1.0 05/12/2014 0718   NITRITE NEGATIVE 05/12/2014 0718   LEUKOCYTESUR MODERATE (A) 05/12/2014 0718   Sepsis Labs Invalid input(s): PROCALCITONIN,  WBC,  LACTICIDVEN Microbiology Recent Results (from the past 240 hour(s))  SARS Coronavirus 2 (CEPHEID - Performed in [facility] hospital lab), Hosp Order     Status: None   Collection Time: 10/18/18  8:40 AM  Result Value Ref Range Status   SARS Coronavirus 2 NEGATIVE NEGATIVE Final    Comment: (NOTE) If result is NEGATIVE SARS-CoV-2 target nucleic acids are NOT DETECTED. The SARS-CoV-2 RNA is generally detectable in upper and lower  respiratory specimens during the acute phase of infection. The lowest  concentration of SARS-CoV-2 viral copies this assay can detect is 250  copies / mL. A negative result does not preclude SARS-CoV-2 infection  and should not be used as the sole basis for treatment or other  patient management decisions.  A negative result may occur with  improper specimen collection / handling, submission of specimen other  than nasopharyngeal swab, presence of viral mutation(s) within the  areas targeted by this assay, and inadequate number of viral copies  (<250 copies / mL). A negative result must be combined  with clinical  observations, patient history, and epidemiological information. If result is POSITIVE SARS-CoV-2 target nucleic acids are DETECTED. The SARS-CoV-2 RNA is generally detectable in upper and lower  respiratory specimens dur ing the acute phase of infection.  Positive  results are indicative of active infection with SARS-CoV-2.  Clinical  correlation with patient history and other diagnostic information is  necessary to determine patient infection status.  Positive results do  not rule out bacterial infection or co-infection with other viruses. If result is PRESUMPTIVE POSTIVE SARS-CoV-2 nucleic acids MAY BE PRESENT.   A presumptive positive result was obtained on the submitted specimen  and confirmed on repeat testing.  While 2019 novel coronavirus  (SARS-CoV-2) nucleic acids may be present in the submitted sample  additional confirmatory testing may be necessary for epidemiological  and / or clinical management purposes  to differentiate between  SARS-CoV-2 and other Sarbecovirus currently known to infect humans.  If clinically indicated additional testing with an alternate test  methodology [phone]) is advised. The SARS-CoV-2 RNA is generally  detectable in upper and lower respiratory sp ecimens during the acute  phase of infection. The expected result is Negative. Fact Sheet for Patients:  BoilerBrush.com.cy Fact Sheet for Healthcare Providers: https://pope.com/ This test is not yet approved or cleared by the [place] FDA and has been authorized for detection and/or diagnosis of SARS-CoV-2 by FDA under an Emergency Use Authorization (EUA).  This EUA will remain in effect (meaning this test can be used) for the duration of the COVID-19 declaration under Section 564(b)(1) of the Act, 21 U.S.C. section 360bbb-3(b)(1), unless the authorization is terminated or revoked sooner. Performed at [facility], [address], [place], [place] [address]   SARS Coronavirus 2 (CEPHEID - Performed in [facility] hospital lab), Hosp Order     Status: None   Collection Time: 10/22/18  5:52 AM  Result Value Ref Range Status   SARS Coronavirus 2 NEGATIVE NEGATIVE Final    Comment: (NOTE) If result is NEGATIVE SARS-CoV-2 target nucleic acids are NOT DETECTED. The SARS-CoV-2 RNA is generally detectable in upper and lower  respiratory specimens during the acute phase of infection. The lowest  concentration of SARS-CoV-2  viral copies this assay can detect is 250  copies / mL. A negative result does not preclude SARS-CoV-2 infection  and should not be used as the sole basis for treatment or other  patient management decisions.  A negative result may occur with  improper specimen collection / handling, submission of specimen other  than nasopharyngeal swab, presence of viral mutation(s) within the  areas targeted by this assay, and inadequate number of viral copies  (<250 copies / mL). A negative result must be combined with clinical  observations, patient history, and epidemiological information. If result is POSITIVE SARS-CoV-2 target nucleic acids are DETECTED. The SARS-CoV-2 RNA is generally detectable in upper and lower  respiratory specimens dur ing the acute phase of infection.  Positive  results are indicative of active infection with SARS-CoV-2.  Clinical  correlation with patient history and other diagnostic information is  necessary to determine patient infection status.  Positive results do  not rule out bacterial infection or co-infection with other viruses. If result is PRESUMPTIVE POSTIVE SARS-CoV-2 nucleic acids MAY BE PRESENT.   A presumptive positive result was obtained on the submitted specimen  and confirmed on repeat testing.  While 2019 novel coronavirus  (SARS-CoV-2) nucleic acids may be present in the submitted sample  additional confirmatory testing may be necessary for epidemiological   and / or clinical management purposes  to differentiate between  SARS-CoV-2 and other Sarbecovirus currently known to infect humans.  If clinically indicated additional testing with an alternate test  methodology [phone]) is advised. The SARS-CoV-2 RNA is generally  detectable in upper and lower respiratory sp ecimens during the acute  phase of infection. The expected result is Negative. Fact Sheet for Patients:  BoilerBrush.com.cy Fact Sheet for Healthcare Providers: https://pope.com/ This test is not yet approved or cleared by the [place] FDA and has been authorized for detection and/or diagnosis of SARS-CoV-2 by FDA under an Emergency Use Authorization (EUA).  This EUA will remain in effect (meaning this test can be used) for the duration of the COVID-19 declaration under Section 564(b)(1) of the Act, 21 U.S.C. section 360bbb-3(b)(1), unless the authorization is terminated or revoked sooner. Performed at [facility], [address] [place], [place] [address]      Time coordinating discharge:  I have spent 35 minutes face to face with the patient and on the ward discussing the patients care, assessment, plan and disposition with other care givers. >50% of the time was devoted counseling the patient about the risks and benefits of treatment/Discharge disposition and coordinating care.   SIGNED:   Dimple Nanas, MD  [facility] 10/23/2018, 10:30 AM   If 7PM-7AM, please contact night-coverage [email or web address]

## 2018-10-23 NOTE — Telephone Encounter (Signed)
With verbal permission from pt, spoke to daughter who report pt was just started on Xarelto today. She state they were informed if pt start bleeding for a prolonged period of time, she wanted to make sure that Dr. Allyson Sabal is the office she is supposed to call. Informed Daughter that is correct.

## 2018-10-23 NOTE — Progress Notes (Addendum)
ANTICOAGULATION CONSULT NOTE  Pharmacy Consult for heparin Indication: pulmonary embolus  No Known Allergies  Patient Measurements: Height: 6' (182.9 cm) Weight: 180 lb 4.8 oz (81.8 kg) IBW/kg (Calculated) : 77.6 Heparin Dosing Weight: 81.2 kg  Vital Signs: Temp: 97.9 F (36.6 C) (05/29 0750) Temp Source: Oral (05/29 0750) BP: 105/57 (05/29 0750) Pulse Rate: 72 (05/29 0750)  Labs: Recent Labs    10/22/18 0117 10/22/18 0717 10/22/18 1235 10/22/18 1835 10/23/18 0333 10/23/18 0739  HGB 14.9  --   --   --  13.8 13.9  HCT 44.2  --   --   --  41.3 41.0  PLT 191  --   --   --  178 179  HEPARINUNFRC  --   --  0.63  --   --  0.96*  CREATININE 1.10  --   --   --  0.86  --   TROPONINI 0.20* 0.17* 0.14* 0.13*  --   --     Estimated Creatinine Clearance: 73.9 mL/min (by C-G formula based on SCr of 0.86 mg/dL).   Assessment: 84 yo man to start heparin for bilateral PE.  He was not on anticoagulation PTA. Of note, recent STEMI with DES to RCA on 5/24.   Heparin level supratherapeutic at 0.96 on 1450 units/hr. No overt bleeding noted but has large bruise on right arm from recent cath, CBC is stable.  Goal of Therapy:  Heparin level 0.3-0.7 units/ml Monitor platelets by anticoagulation protocol: Yes   Plan:  Decrease heparin drip to 1250 units/hr Daily heparin level, CBC Monitor for s/sx of bleeding Plan triple therapy with DOAC for month then reassess Appears apixaban may be less costly than rivaroxaban for patient   Thank you for involving pharmacy in this patient's care.  Loura Back, PharmD, BCPS Clinical Pharmacist Clinical phone for 10/23/2018 until 3p is [phone] 10/23/2018 9:05 AM  **Pharmacist phone directory can be found on [email or web address] listed under [facility]**   Addendum: Consulted to change to Xarelto.  Stop heparin drip Xarelto 15 mg PO bid with meals for 21 days then 20 mg PO daily with supper Educate prior to d/c Pharmacy to follow  peripherally  Cicero Duck, PharmD, BCPS 10:38 AM

## 2018-10-27 ENCOUNTER — Telehealth: Payer: Self-pay | Admitting: Adult Health

## 2018-10-27 ENCOUNTER — Telehealth: Payer: Self-pay | Admitting: *Deleted

## 2018-10-27 DIAGNOSIS — Z79899 Other long term (current) drug therapy: Secondary | ICD-10-CM

## 2018-10-27 NOTE — Telephone Encounter (Signed)
Returned call to daughter [name]. Patient had cath/stent on 5/24 by Dr. Allyson Sabal. He is on ASA, Brilinta, Xarelto. Daughter reports patient has bruising in lower abdomen, right upper thigh, private area. Her mother (patient's wife) reports the bruises are about a quarter width in size and have not changed from yesterday to today. He only had pain over the weekend in right abdomen area that he reported felt like a hernia but this has resolved. She reports his BP is also low - 99/44 - he is not lightheaded or dizzy. She states there was some discussion in the hospital about all the medications he was on - she states Dr. Allyson Sabal didn't want him on so much but hospitalist did (?). He has hospital follow up with NP on 6/8. Advised would route to MD & NP for review and advice.

## 2018-10-27 NOTE — Telephone Encounter (Signed)
Spoke with patient's daughter and she knows to bring Gordon Novak into the office on Friday October 30, 2018 at 11:45AM for his appointment with Dr. Allyson Sabal.

## 2018-10-27 NOTE — Telephone Encounter (Signed)
New Message    Gordon Novak is calling and says that the Pt has bruising in his lower abdomen, right upper thigh area and private area.    Please call Pt on his home phone

## 2018-10-27 NOTE — Telephone Encounter (Signed)
Let us get a CBC on him in the next couple days and keep his post hospital follow-up in the office on 6/8 unless he becomes symptomatic.

## 2018-10-27 NOTE — Telephone Encounter (Signed)

## 2018-10-27 NOTE — Telephone Encounter (Signed)
Dr. [name] addressed this on prior phone note. However if he is hypotensive and feeling badly, will need to see him sooner. Please make appointment with the [facility] or with Dr.Berry sooner than the 11/02/2018 appt. He will need to have a CBC drawn today.

## 2018-10-27 NOTE — Telephone Encounter (Signed)
Spoke with daughter and provided her advice per Dr. Allyson Sabal & K. Liborio Nixon. Patient will come today for CBC. Patient has been r/s for sooner VIDEO visit with Dr. Allyson Sabal per DNP recommendations on Friday June 5 @ 1145  Daughter Gordon Novak will be present for visit. For pre-visit work up, please call her at [phone]. For video visit on tablet email: [email or web address].      Virtual Visit Pre-Appointment Phone Call " I am calling you today to discuss your upcoming appointment. We are currently trying to limit exposure to the virus that causes COVID-19 by seeing patients at home rather than in the office."  1. "What is the BEST phone number to call the day of the visit?" - [phone]  2. "Do you have or have access to (through a family member/friend) a smartphone with video capability that we can use for your visit?" YES - in appointment notes - email: [email or web address]  3. Confirm consent - "In the setting of the current Covid19 crisis, you are scheduled for a (phone or video) visit with your provider on (date) at (time).  Just as we do with many in-office visits, in order for you to participate in this visit, we must obtain consent.  If you'd like, I can send this to your mychart (if signed up) or email for you to review.  Otherwise, I can obtain your verbal consent now.  All virtual visits are billed to your insurance company just like a normal visit would be.  By agreeing to a virtual visit, we'd like you to understand that the technology does not allow for your provider to perform an examination, and thus may limit your provider's ability to fully assess your condition. If your provider identifies any concerns that need to be evaluated in person, we will make arrangements to do so.  Finally, though the technology is pretty good, we cannot assure that it will always work on either your or our end, and in the setting of a video visit, we may have to convert it to a phone-only visit.  In either  situation, we cannot ensure that we have a secure connection.  Are you willing to proceed?" STAFF: Did the patient verbally acknowledge consent to telehealth visit? Document YES/NO here: YES  4. Advise patient to be prepared - "Two hours prior to your appointment, go ahead and check your blood pressure, pulse, oxygen saturation, and your weight (if you have the equipment to check those) and write them all down. When your visit starts, your provider will ask you for this information. If you have an Apple Watch or Kardia device, please plan to have heart rate information ready on the day of your appointment. Please have a pen and paper handy nearby the day of the visit as well."  5. Give patient instructions for MyChart download to smartphone OR Doximity/Doxy.me as below if video visit (depending on what platform provider is using)  6. Inform patient they will receive a phone call 15 minutes prior to their appointment time (may be from unknown caller ID) so they should be prepared to answer    TELEPHONE CALL NOTE  Ethridge Fittro Salom has been deemed a candidate for a follow-up tele-health visit to limit community exposure during the Covid-19 pandemic. I spoke with the patient via phone to ensure availability of phone/video source, confirm preferred email & phone number, and discuss instructions and expectations.  I reminded RAMOS SHEPPARD to be prepared with any vital sign and/or heart rhythm information that  could potentially be obtained via home monitoring, at the time of his visit. I reminded LORIE MELICHAR to expect a phone call prior to his visit.  Lindell Spar, RN 10/27/2018 11:12 AM   INSTRUCTIONS FOR DOWNLOADING THE MYCHART APP TO SMARTPHONE  - The patient must first make sure to have activated MyChart and know their login information - If Apple, go to [employer] and type in MyChart in the search bar and download the app. If Android, ask patient to go to [employer] and type in  [place] in the search bar and download the app. The app is free but as with any other app downloads, their phone may require them to verify saved payment information or Apple/Android password.  - The patient will need to then log into the app with their MyChart username and password, and select [facility] as their healthcare provider to link the account. When it is time for your visit, go to the MyChart app, find appointments, and click Begin Video Visit. Be sure to Select Allow for your device to access the Microphone and Camera for your visit. You will then be connected, and your provider will be with you shortly.  **If they have any issues connecting, or need assistance please contact MyChart service desk [phone])**  **If using a computer, in order to ensure the best quality for their visit they will need to use either of the following Internet Browsers: [employer] or Google Chrome**  IF USING DOXIMITY or DOXY.ME - The patient will receive a link just prior to their visit by text.     FULL LENGTH CONSENT FOR TELE-HEALTH VISIT   I hereby voluntarily request, consent and authorize [facility] and its employed or contracted physicians, physician assistants, nurse practitioners or other licensed health care professionals (the Practitioner), to provide me with telemedicine health care services (the "Services") as deemed necessary by the treating Practitioner. I acknowledge and consent to receive the Services by the Practitioner via telemedicine. I understand that the telemedicine visit will involve communicating with the Practitioner through live audiovisual communication technology and the disclosure of certain medical information by electronic transmission. I acknowledge that I have been given the opportunity to request an in-person assessment or other available alternative prior to the telemedicine visit and am voluntarily participating in the telemedicine visit.  I  understand that I have the right to withhold or withdraw my consent to the use of telemedicine in the course of my care at any time, without affecting my right to future care or treatment, and that the Practitioner or I may terminate the telemedicine visit at any time. I understand that I have the right to inspect all information obtained and/or recorded in the course of the telemedicine visit and may receive copies of available information for a reasonable fee.  I understand that some of the potential risks of receiving the Services via telemedicine include:  Marland Kitchen Delay or interruption in medical evaluation due to technological equipment failure or disruption; . Information transmitted may not be sufficient (e.g. poor resolution of images) to allow for appropriate medical decision making by the Practitioner; and/or  . In rare instances, security protocols could fail, causing a breach of personal health information.  Furthermore, I acknowledge that it is my responsibility to provide information about my medical history, conditions and care that is complete and accurate to the best of my ability. I acknowledge that Practitioner's advice, recommendations, and/or decision may be based on factors not  within their control, such as incomplete or inaccurate data provided by me or distortions of diagnostic images or specimens that may result from electronic transmissions. I understand that the practice of medicine is not an exact science and that Practitioner makes no warranties or guarantees regarding treatment outcomes. I acknowledge that I will receive a copy of this consent concurrently upon execution via email to the email address I last provided but may also request a printed copy by calling the office of [facility].    I understand that my insurance will be billed for this visit.   I have read or had this consent read to me. . I understand the contents of this consent, which adequately explains the  benefits and risks of the Services being provided via telemedicine.  . I have been provided ample opportunity to ask questions regarding this consent and the Services and have had my questions answered to my satisfaction. . I give my informed consent for the services to be provided through the use of telemedicine in my medical care  By participating in this telemedicine visit I agree to the above.

## 2018-10-28 LAB — CBC
Hematocrit: 39.9 % (ref 37.5–51.0)
Hemoglobin: 14.4 g/dL (ref 13.0–17.7)
MCH: 33.7 pg — ABNORMAL HIGH (ref 26.6–33.0)
MCHC: 36.1 g/dL — ABNORMAL HIGH (ref 31.5–35.7)
MCV: 93 fL (ref 79–97)
Platelets: 280 10*3/uL (ref 150–450)
RBC: 4.27 x10E6/uL (ref 4.14–5.80)
RDW: 13.2 % (ref 11.6–15.4)
WBC: 8.3 10*3/uL (ref 3.4–10.8)

## 2018-10-28 NOTE — Telephone Encounter (Signed)
Pt 6/5 appt is in the office

## 2018-10-29 ENCOUNTER — Telehealth: Payer: Self-pay | Admitting: Adult Health

## 2018-10-29 NOTE — Telephone Encounter (Signed)
Patient already has OV with Allyson Sabal on 10/30/18, may not need virtual on 11/02/18, admin staff was informed

## 2018-10-30 ENCOUNTER — Ambulatory Visit (INDEPENDENT_AMBULATORY_CARE_PROVIDER_SITE_OTHER): Payer: Medicare Other | Admitting: Cardiovascular Disease

## 2018-10-30 ENCOUNTER — Other Ambulatory Visit: Payer: Self-pay

## 2018-10-30 ENCOUNTER — Encounter: Payer: Self-pay | Admitting: Cardiovascular Disease

## 2018-10-30 DIAGNOSIS — I255 Ischemic cardiomyopathy: Secondary | ICD-10-CM

## 2018-10-30 DIAGNOSIS — I2119 ST elevation (STEMI) myocardial infarction involving other coronary artery of inferior wall: Secondary | ICD-10-CM

## 2018-10-30 DIAGNOSIS — E782 Mixed hyperlipidemia: Secondary | ICD-10-CM

## 2018-10-30 DIAGNOSIS — I2699 Other pulmonary embolism without acute cor pulmonale: Secondary | ICD-10-CM | POA: Diagnosis not present

## 2018-10-30 HISTORY — DX: Ischemic cardiomyopathy: I25.5

## 2018-10-30 MED ORDER — CLOPIDOGREL BISULFATE 75 MG PO TABS: 75.0000 mg | ORAL_TABLET | Freq: Every day | ORAL | 3 refills | Status: DC

## 2018-10-30 MED ORDER — LISINOPRIL 5 MG PO TABS: 5.0000 mg | ORAL_TABLET | Freq: Every day | ORAL | 3 refills | Status: DC

## 2018-10-30 NOTE — Assessment & Plan Note (Signed)
History of recent inferior STEMI 20 treated with ECI and drug-eluting stenting of the RCA via the right radial approach 33 minutes.  I used a 3 mm x 20 mm Synergy drug-eluting stent.  His left system had no disease.  Was placed on dual antiplatelet therapy including aspirin and Brilinta.

## 2018-10-30 NOTE — Assessment & Plan Note (Signed)
History of recently diagnosed pulmonary embolism on 10/22/2018 after coming back 2 days after discharge with pleuritic right-sided chest pain.  Chest CT showed multiple subsegmental filling defects consistent with pulmonary embolus and he was begun on Xarelto.  His chest pain symptoms have resolved.  He apparently was seen by Dr. [name] at the time who recommended stopping Brilinta and beginning Plavix but this was never done.  He does have excessive ecchymosis.

## 2018-10-30 NOTE — Assessment & Plan Note (Signed)
EF the time of his inferior MI was 35 to 40%.  He is on a low-dose beta-blocker and lisinopril.

## 2018-10-30 NOTE — Progress Notes (Signed)
10/30/2018 Gordon Novak   12-10-1934  [id number]  Primary Physician [name], MD Primary Cardiologist: Runell Gess MD Nicholes Calamity, [place]  HPI:  Gordon Novak is a 84 y.o. fit appearing married Caucasian male father of 1 daughter who presented to [facility] 03/20/2019 with chest pain since 4:00 that morning.  The pain would not improve with Tums.  He does have a history of hypertension.  He was diagnosed with an acute inferolateral STEMI was brought urgently to the Cath Lab by myself revealing an occluded right coronary artery.  I performed PCI and drug-eluting stenting using a 3 mm x 28 mm Synergy drug-eluting stent with a door to balloon time of 33 minutes.  His left system was free of disease.  His EF by echo was 35 to 40% with an inferior wall motion abnormality.  He was discharged home 2 days later and came back on the 28th of pleuritic right-sided chest pain.  Chest CTA revealed multiple succimer segmental filling defects consistent with pulmonary emboli.  He was begun on Xarelto.  His symptoms have improved although he does have diffuse ecchymosis.  He is accompanied by his daughter-in-law Shan Levans today.   Current Meds  Medication Sig  . allopurinol (ZYLOPRIM) 300 MG tablet Take 300 mg by mouth daily.   Marland Kitchen aspirin EC 81 MG EC tablet Take 1 tablet (81 mg total) by mouth daily.  Marland Kitchen atorvastatin (LIPITOR) 80 MG tablet Take 1 tablet (80 mg total) by mouth daily at 6 PM.  . beta carotene w/minerals (OCUVITE) tablet Take 1 tablet by mouth daily.   . bisacodyl (DULCOLAX) 5 MG EC tablet Take 1 tablet (5 mg total) by mouth daily.  Marland Kitchen docusate sodium 100 MG CAPS Take 100 mg by mouth 2 (two) times daily. (Patient taking differently: Take 100 mg by mouth at bedtime. )  . furosemide (LASIX) 20 MG tablet Take 20 mg by mouth daily.   Marland Kitchen lisinopril (PRINIVIL,ZESTRIL) 10 MG tablet Take 10 mg by mouth daily.   . metFORMIN (GLUCOPHAGE-XR) 500 MG 24 hr tablet Take 500 mg by  mouth daily with breakfast.  . metoprolol tartrate (LOPRESSOR) 25 MG tablet Take 0.5 tablets (12.5 mg total) by mouth 2 (two) times daily.  . Multiple Vitamin (MULTIVITAMIN WITH MINERALS) TABS tablet Take 1 tablet by mouth every evening.   . nitroGLYCERIN (NITROSTAT) 0.4 MG SL tablet Place 1 tablet (0.4 mg total) under the tongue every 5 (five) minutes x 3 doses as needed for chest pain.  Marland Kitchen oxyCODONE-acetaminophen (PERCOCET/ROXICET) 5-325 MG per tablet Take 2 tablets by mouth every 6 (six) hours as needed for severe pain.   . potassium chloride SA (K-DUR) 20 MEQ tablet Take 20 mEq by mouth daily.   . prednisoLONE acetate (PRED FORTE) 1 % ophthalmic suspension Place 1 drop into both eyes at bedtime.   Marland Kitchen PROCTOFOAM HC rectal foam Place 1 applicator rectally 3 (three) times daily as needed for hemorrhoids or itching.   . Rivaroxaban 15 & 20 MG TBPK TStart with one  tablet by mouth twice a day with food. On Day 22, switch to one  tablet once a day with food.  . tamsulosin (FLOMAX) 0.4 MG CAPS capsule Take 2 capsules (0.8 mg total) by mouth daily. (Patient taking differently: Take 0.4 mg by mouth daily. )  . ticagrelor (BRILINTA) 90 MG TABS tablet Take 1 tablet (90 mg total) by mouth 2 (two) times daily.  . valACYclovir (VALTREX)  500 MG tablet Take 500 mg by mouth 2 (two) times daily as needed (cold sores).     No Known Allergies  Social History   Socioeconomic History  . Marital status: Married    Spouse name: Not on file  . Number of children: Not on file  . Years of education: Not on file  . Highest education level: Not on file  Occupational History  . Not on file  Social Needs  . Financial resource strain: Not on file  . Food insecurity:    Worry: Not on file    Inability: Not on file  . Transportation needs:    Medical: Not on file    Non-medical: Not on file  Tobacco Use  . Smoking status: Never Smoker  . Smokeless tobacco: Never Used  Substance and Sexual Activity  .  Alcohol use: No  . Drug use: No  . Sexual activity: Not on file  Lifestyle  . Physical activity:    Days per week: Not on file    Minutes per session: Not on file  . Stress: Not on file  Relationships  . Social connections:    Talks on phone: Not on file    Gets together: Not on file    Attends religious service: Not on file    Active member of club or organization: Not on file    Attends meetings of clubs or organizations: Not on file    Relationship status: Not on file  . Intimate partner violence:    Fear of current or ex partner: Not on file    Emotionally abused: Not on file    Physically abused: Not on file    Forced sexual activity: Not on file  Other Topics Concern  . Not on file  Social History Narrative   Lives in [place] w/ his wife.  Active but does not routinely exercise.  Able to push-mow his yard.  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     Review of Systems: General: negative for chills, fever, night sweats or weight changes.  Cardiovascular: negative for chest pain, dyspnea on exertion, edema, orthopnea, palpitations, paroxysmal nocturnal dyspnea or shortness of breath Dermatological: negative for rash Respiratory: negative for cough or wheezing Urologic: negative for hematuria Abdominal: negative for nausea, vomiting, diarrhea, bright red blood per rectum, melena, or hematemesis Neurologic: negative for visual changes, syncope, or dizziness All other systems reviewed and are otherwise negative except as noted above.    Blood pressure (!) 92/54, pulse 83, temperature 98.2 F (36.8 C), height 6' (1.829 m), weight 179 lb (81.2 kg).  General appearance: alert and no distress Neck: no adenopathy, no carotid bruit, no JVD, supple, symmetrical, trachea midline and thyroid not enlarged, symmetric, no tenderness/mass/nodules Lungs: clear to auscultation bilaterally Heart: regular rate and rhythm, S1, S2 normal, no murmur, click, rub or gallop Extremities: His  right radial puncture site is well-healed.  He does have ecchymosis of his right forearm and both hips and scrotal area. Pulses: 2+ and symmetric Skin: Skin color, texture, turgor normal. No rashes or lesions Neurologic: Alert and oriented X 3, normal strength and tone. Normal symmetric reflexes. Normal coordination and gait  EKG not performed today  ASSESSMENT AND PLAN:   Acute ST elevation myocardial infarction (STEMI) of inferior wall (HCC) History of recent inferior STEMI 20 treated with ECI and drug-eluting stenting of the RCA via the right radial approach 33 minutes.  I used a 3 mm x 20 mm Synergy drug-eluting stent.  His left system had no disease.  Was placed on dual antiplatelet therapy including aspirin and Brilinta.  Hyperlipidemia History of hyperlipidemia on high-dose statin therapy with recent lipid profile performed 10/19/2018 revealing a total cholesterol 138, LDL 66 and HDL of 46.  Pulmonary embolism (HCC) History of recently diagnosed pulmonary embolism on 10/22/2018 after coming back 2 days after discharge with pleuritic right-sided chest pain.  Chest CT showed multiple subsegmental filling defects consistent with pulmonary embolus and he was begun on Xarelto.  His chest pain symptoms have resolved.  He apparently was seen by Dr. Cristal Deerhristopher at the time who recommended stopping Brilinta and beginning Plavix but this was never done.  He does have excessive ecchymosis.      Runell GessJonathan J. Macalister Arnaud MD FACP,FACC,FAHA, [facility] 10/30/2018 12:42 PM

## 2018-10-30 NOTE — Assessment & Plan Note (Signed)
History of hyperlipidemia on high-dose statin therapy with recent lipid profile performed 10/19/2018 revealing a total cholesterol 138, LDL 66 and HDL of 46.

## 2018-10-30 NOTE — Patient Instructions (Addendum)
Medication Instructions:  Your physician has recommended you make the following change in your medication:   STOP TAKING YOUR TICAGRELOR (BRILINTA)TODAY. DO NOT TAKE ANY MORE BRILINTA FROM NOW ON UNLESS OTHERWISE ADVISED BY YOUR PROVIDER.  TAKE 300 MG (4 TABLETS) OF CLOPIDOGREL (PLAVIX ) BY MOUTH TOMORROW (Saturday October 31, 2018) ONLY. THIS IS A "LOADING" DOSE.   STARTING Sunday November 01, 2018, TAKE 75 MG OF CLOPIDOGREL (PLAVIX) ONE TABLET BY MOUTH DAILY.  STOP TAKING YOUR ASPIRIN ON Sunday November 15, 2018. DO NOT TAKE ANY MORE ASPIRIN FROM NOW ON UNLESS OTHERWISE ADVISED BY YOUR PROVIDER.  DECREASE YOUR LISINOPRIL(PRINIVIL) TO 5 MG BY MOUTH DAILY.  FOLLOW THE INSTRUCTIONS PROVIDED ON YOUR RIVAROXABAN (XARELTO) PRESCRIPTION ON HOW TO TAKE YOUR XARELTO. THIS WAS SUPPOSED TO BE STARTED ON 10/23/2018.  If you need a refill on your cardiac medications before your next appointment, please call your pharmacy.   Lab work: NONE If you have labs (blood work) drawn today and your tests are completely normal, you will receive your results only by: Marland Kitchen MyChart Message (if you have MyChart) OR . A paper copy in the mail If you have any lab test that is abnormal or we need to change your treatment, we will call you to review the results.  Testing/Procedures: Your physician has requested that you have an echocardiogram. Echocardiography is a painless test that uses sound waves to create images of your heart. It provides your doctor with information about the size and shape of your heart and how well your heart's chambers and valves are working. This procedure takes approximately one hour. There are no restrictions for this procedure. LOCATION: [address], [place], [place] [address] TO BE SCHEDULED September 2020 (IN 3 MONTHS)   Follow-Up: At [facility], you and your health needs are our priority.  As part of our continuing mission to provide you with exceptional heart care, we have created  designated Provider Care Teams.  These Care Teams include your primary Cardiologist (physician) and Advanced Practice Providers (APPs -  Physician Assistants and Nurse Practitioners) who all work together to provide you with the care you need, when you need it. You will need a follow up appointment in 3 months WITH DR. Allyson Sabal.  Please call our office 2 months in advance to schedule this appointment.

## 2018-11-02 ENCOUNTER — Telehealth: Payer: Medicare Other | Admitting: Adult Health

## 2018-11-04 DIAGNOSIS — Z0189 Encounter for other specified special examinations: Secondary | ICD-10-CM

## 2018-11-10 LAB — BASIC METABOLIC PANEL
BUN/Creatinine Ratio: 16 (ref 10–24)
BUN: 15 mg/dL (ref 8–27)
CO2: 24 mmol/L (ref 20–29)
Calcium: 9.3 mg/dL (ref 8.6–10.2)
Chloride: 104 mmol/L (ref 96–106)
Creatinine, Ser: 0.91 mg/dL (ref 0.76–1.27)
GFR calc Af Amer: 91 mL/min/{1.73_m2} (ref >59)
GFR calc non Af Amer: 79 mL/min/{1.73_m2} (ref >59)
Glucose: 187 mg/dL — ABNORMAL HIGH (ref 65–99)
Potassium: 4.3 mmol/L (ref 3.5–5.2)
Sodium: 143 mmol/L (ref 134–144)

## 2018-11-16 ENCOUNTER — Other Ambulatory Visit: Payer: Self-pay | Admitting: Cardiovascular Disease

## 2018-11-16 ENCOUNTER — Telehealth: Payer: Self-pay | Admitting: Cardiovascular Disease

## 2018-11-16 MED ORDER — RIVAROXABAN 20 MG PO TABS: 20.0000 mg | ORAL_TABLET | Freq: Every day | ORAL | 1 refills | Status: DC

## 2018-11-16 NOTE — Telephone Encounter (Signed)
Pt is a 84 yr old male who saw Dr. Gwenlyn Found on 10/30/18, weight at that visit 81.2Kg. SCr on 11/10/18 was 0.91, CrCl is 42mL/min. Will refill Xarelto 20mg  QD.

## 2018-11-16 NOTE — Telephone Encounter (Signed)
°*  STAT* If patient is at the pharmacy, call can be transferred to refill team.   1. Which medications need to be refilled? (please list name of each medication and dose if known) xarelto 20 mg  2. Which pharmacy/location (including street and city if local pharmacy) is medication to be sent to?[facility] [place]   3. Do they need a 30 day or 90 day supply? 90 if the patient can

## 2018-11-16 NOTE — Telephone Encounter (Signed)
New message:     Patient daugter calling concering her fathers lab results and want medication he should be taking. Please call patient daughter.

## 2018-11-16 NOTE — Telephone Encounter (Signed)
Reviewed 6/16 BMP results with pt daughter Shirlean Mylar. Informed her of Dr. Kennon Holter recommendations that pt continue on potassium level if WNL. She states pt is changing providers and will be a pt of Dr. Ellyn Hack. Advised her to f/u with Dr. Ellyn Hack on recommendations regarding K+. Pt daughter verbalized understanding

## 2018-11-17 ENCOUNTER — Telehealth: Payer: Self-pay | Admitting: Cardiovascular Disease

## 2018-11-17 NOTE — Telephone Encounter (Signed)
Will have to get the okay from both doctors in order to do switch. Will route to both MD's to make aware.

## 2018-11-17 NOTE — Telephone Encounter (Signed)
Lets have patient set up with Dr.Harding. Thank you!

## 2018-11-17 NOTE — Telephone Encounter (Signed)
Fine with me

## 2018-11-17 NOTE — Telephone Encounter (Signed)
OK ° °DH °

## 2018-11-17 NOTE — Telephone Encounter (Signed)
  Daughter is calling because Gordon Novak would like to switch from Dr Gwenlyn Found to Dr Ellyn Hack

## 2018-11-19 NOTE — Telephone Encounter (Signed)
No message needed °

## 2018-11-19 NOTE — Telephone Encounter (Signed)
Follow up   Patient's daughter is calling has questions about an appointment being setup for her father. Please call.

## 2018-11-19 NOTE — Telephone Encounter (Signed)
Will route this message to Dr. Allison Quarry assigned scheduler and CC his covering RN, to schedule this pt an appt with Dr. Ellyn Hack to establish care.

## 2018-11-19 NOTE — Telephone Encounter (Signed)
CALLED MR [facility]- TO OBTAIN [name]'S NUMBER   spoke to daughter- robin   She would like patient to have appt to discuss  Medication patient is receiving prior to echo schedule for sept 2020.  appt schedule for  July 8  At 2:40 am

## 2018-12-02 ENCOUNTER — Ambulatory Visit (INDEPENDENT_AMBULATORY_CARE_PROVIDER_SITE_OTHER): Payer: Medicare Other | Admitting: Cardiology

## 2018-12-02 ENCOUNTER — Other Ambulatory Visit: Payer: Self-pay

## 2018-12-02 VITALS — BP 91/52 | HR 71 | Temp 98.4°F | Ht 72.0 in | Wt 179.5 lb

## 2018-12-02 DIAGNOSIS — I255 Ischemic cardiomyopathy: Secondary | ICD-10-CM

## 2018-12-02 DIAGNOSIS — I25110 Atherosclerotic heart disease of native coronary artery with unstable angina pectoris: Secondary | ICD-10-CM | POA: Diagnosis not present

## 2018-12-02 DIAGNOSIS — I2119 ST elevation (STEMI) myocardial infarction involving other coronary artery of inferior wall: Secondary | ICD-10-CM

## 2018-12-02 DIAGNOSIS — I5022 Chronic systolic (congestive) heart failure: Secondary | ICD-10-CM

## 2018-12-02 DIAGNOSIS — I2699 Other pulmonary embolism without acute cor pulmonale: Secondary | ICD-10-CM

## 2018-12-02 DIAGNOSIS — E785 Hyperlipidemia, unspecified: Secondary | ICD-10-CM

## 2018-12-02 DIAGNOSIS — I952 Hypotension due to drugs: Secondary | ICD-10-CM

## 2018-12-02 DIAGNOSIS — Z955 Presence of coronary angioplasty implant and graft: Secondary | ICD-10-CM | POA: Diagnosis not present

## 2018-12-02 MED ORDER — FUROSEMIDE 20 MG PO TABS: 20.0000 mg | ORAL_TABLET | Freq: Every day | ORAL | 3 refills | Status: DC

## 2018-12-02 MED ORDER — LISINOPRIL 2.5 MG PO TABS: 2.5000 mg | ORAL_TABLET | Freq: Every day | ORAL | 3 refills | Status: DC

## 2018-12-02 MED ORDER — METOPROLOL SUCCINATE ER 25 MG PO TB24: 12.5000 mg | ORAL_TABLET | Freq: Two times a day (BID) | ORAL | 3 refills | Status: DC

## 2018-12-02 MED ORDER — METOPROLOL SUCCINATE ER 25 MG PO TB24: 12.5000 mg | ORAL_TABLET | Freq: Every day | ORAL | 3 refills | Status: DC

## 2018-12-02 NOTE — Patient Instructions (Addendum)
Medication Instructions:  Decrease  Lisinopril to 2.5 mg one tablet daily    Change to taking Toprol XL ( metoprolol succinate ) 12.5 mg ( 1/2 TABLET  AT BEDTIME .  Sliding scale Lasix ( furosemide): Weigh yourself when you get home, then Daily in the Morning. Your dry weight will be what your scale says on the day you return home.(here is 171 lbs.).   If you gain more than 3 pounds from dry weight: Increase the Lasix dosing to 20 mg in the morning and 20 mg in the afternoon until weight returns to baseline dry weight.  If weight gain is greater than 5 pounds in 2 days: Increased to Lasix 40 mg in the morning and 20 mg in the afternoon  and contact the office for further assistance if weight does not go down the next day.  If the weight goes down more than 3 pounds from dry weight: Hold Lasix until it returns to baseline dry weight  If you need a refill on your cardiac medications before your next appointment, please call your pharmacy.   Lab work:  Not needed  Testing/Procedures:  Not needed-- keep  Appointment for Echo in Sept 2020 Follow-Up: At [facility], you and your health needs are our priority.  As part of our continuing mission to provide you with exceptional heart care, we have created designated Provider Care Teams.  These Care Teams include your primary Cardiologist (physician) and Advanced Practice Providers (APPs -  Physician Assistants and Nurse Practitioners) who all work together to provide you with the care you need, when you need it. . You will need TO KEEP  follow up appointment in Oct 2020.  Please call our office 2 months in advance to schedule this appointment.  You may see Glenetta Hew, MD or one of the following Advanced Practice Providers on your designated Care Team:   . Rosaria Ferries, PA-C . Jory Sims, DNP, ANP  Any Other Special Instructions Will Be Listed Below (If Applicable).

## 2018-12-02 NOTE — Progress Notes (Signed)
PCP: Christain Sacramento, MD  Clinic Note: Chief Complaint  Patient presents with  . Follow-up    Changed cardiologist, discussed medications  . Coronary Artery Disease    Recent STEMI, followed by pulmonary embolus  . Cardiomyopathy    HPI: Gordon Novak is a 84 y.o. male with a PMH notable for inferolateral STEMI in Oct 18, 2018 resulting in PCI to an occluded RCA, along with reduced EF of 35 to 40% followed by readmission for multiple segment subsegmental PE.  Sayer presents today for 1 month follow-up to establish new cardiologist care and to discuss upcoming procedures and multiple medications.  Nell's daughters father-in-law is a former patient of mine.  KOHEI Novak was last seen on October 30, 2018 by Dr. Quay Burow --apparently the patient was supposed to been converted from Brilinta to Plavix when starting on Xarelto. --> Recommendations were to make the conversion from Brilinta to Plavix, to take in conjunction with Xarelto.  --> Reduce lisinopril to 5 mg (due to hypotension). --> 2D echo scheduled for roughly September  Recent Hospitalizations:   May 24-26 2020: Acute inferior-inferolateral STEMI (RCA -> DES PCI to RCA.  Oct 22, 2018: Subsegmental pulmonary emboli; curbside by cardiology suggested converting from Brilinta to Plavix and start Xarelto.  Studies Personally Reviewed - (if available, images/films reviewed: From Epic Chart or Care Everywhere)  Cath-PCA 10/18/2018: 100% mRCA -- (DES PCI-Synergy 3 mm x 28 mm - 3.3 mm).  Otherwise normal coronaries  TTE 10/18/2018: EF 35-40%, diffuse hypokinesis.  GR 1 DD  Chest CT Angiogram: Multifocal segments and subsegmental pulmonary emboli seen in both lower lobes and right middle lobe.  No right heart dilation.  Atherosclerosis and cholelithiasis  Interval History: Gordon Novak is being seen today along with his daughter.  They have met multiple questions about his medications.  He says he had one episode of last week where he  has some chest discomfort that after some physical activity that was relieved with nitroglycerin.  Otherwise he has not had any chest pain or pressure with rest or exertion.  The most thing he notes is that he just feels tired and and low energy compared to last year.  He also says he may feel some skipping beats but nothing like prolonged rapid irregular heartbeat episode. He is felt quite dizzy and weak especially with standing up, with lack of get up and go.  He has not had any syncope or real near syncope though. He says that he always has left leg swelling more than the right, and it still present but not that bad.  He does not like taking diuretics.  No TIA/amaurosis fugax symptoms. No melena, hematochezia, hematuria, or epstaxis. No claudication.  ROS: A comprehensive was performed. Review of Systems  Constitutional: Negative for malaise/fatigue (Just has lower energy than usual.).  HENT: Negative for congestion.   Respiratory: Negative for cough and shortness of breath.   Cardiovascular: Positive for leg swelling (Chronic left greater than right).  Gastrointestinal: Negative for abdominal pain, constipation, diarrhea and heartburn.  Genitourinary: Negative for dysuria.  Musculoskeletal: Positive for joint pain. Negative for falls.  Neurological: Positive for dizziness (And weak) and weakness. Negative for focal weakness.  Endo/Heme/Allergies: Does not bruise/bleed easily.  Psychiatric/Behavioral: Negative for depression and memory loss. The patient is not nervous/anxious and does not have insomnia.   All other systems reviewed and are negative.   I have reviewed and (if needed) personally updated the patient's problem list, medications, allergies, past  medical and surgical history, social and family history.   Past Medical History:  Diagnosis Date  . Acute systolic heart failure (HCC)    35-40%  . Bladder outlet obstruction   . Blindness of left eye   . CAD S/P percutaneous  coronary angioplasty 10/22/2018   Cath-PCA 10/18/2018: 100% mRCA -- (DES PCI-Synergy 3 mm x 28 mm - 3.3 mm).  Otherwise normal coronaries  . Chronic Left leg swelling    a. Since 2007.  Marland Kitchen Essential hypertension   . Gout   . Hemorrhoids 05/12/2014  . Ischemic cardiomyopathy 10/30/2018   Post inferior STEMI: TTE 10/18/2018: EF 35-40%, diffuse hypokinesis.  GR 1 DD  . Nephrolithiasis   . PE (pulmonary thromboembolism) ([facility]) 10/22/2018  . STEMI (ST elevation myocardial infarction) ([facility])    10/18/18 PCI/DES x1 to the mRCA  . Type 2 diabetes mellitus ([facility])     Past Surgical History:  Procedure Laterality Date  . CATARACT EXTRACTION    . CORONARY/GRAFT ACUTE MI REVASCULARIZATION N/A 10/18/2018   Procedure: Coronary/Graft Acute MI Revascularization - Cornonary Stent Intervention;  Surgeon: Lorretta Harp, MD;  Location: [facility];;  100% mRCA -- (DES PCI-Synergy 3 mm x 28 mm - 3.3 mm).    . CYSTOSCOPY W/ URETERAL STENT PLACEMENT Left 05/07/2014   Procedure: CYSTOSCOPY WITH RETROGRADE PYELOGRAM/ LEFT URETERAL STENT PLACEMENT;  Surgeon: Ardis Hughs, MD;  Location: [facility];  Service: Urology;  Laterality: Left;  . CYSTOSCOPY WITH RETROGRADE PYELOGRAM, URETEROSCOPY AND STENT PLACEMENT Left 05/14/2014   Procedure: LEFT URETEROSCOPY AND STENT EXCHANGE,STONE BASKETRY EXTRACTION;  Surgeon: Festus Aloe, MD;  Location: [facility];  Service: Urology;  Laterality: Left;  . HOLMIUM LASER APPLICATION Left 19/14/7829   Procedure: HOLMIUM LASER APPLICATION;  Surgeon: Festus Aloe, MD;  Location: [facility];  Service: Urology;  Laterality: Left;  . INTRAOCULAR LENS INSERTION Right   . LEFT HEART CATH AND CORONARY ANGIOGRAPHY N/A 10/18/2018   Procedure: LEFT HEART CATH AND CORONARY ANGIOGRAPHY;  Surgeon: Lorretta Harp, MD;  Location: [facility];;  100% mRCA -- (DES PCI)..  Otherwise normal coronaries  . TRANSTHORACIC ECHOCARDIOGRAM  10/18/2018    Post inferior STEMI: EF 35-40%, diffuse  hypokinesis.  GR 1 DD    Current Meds  Medication Sig  . allopurinol (ZYLOPRIM) 300 MG tablet Take 300 mg by mouth daily.   Marland Kitchen atorvastatin (LIPITOR) 80 MG tablet Take 1 tablet (80 mg total) by mouth daily at 6 PM.  . beta carotene w/minerals (OCUVITE) tablet Take 1 tablet by mouth daily.   . bisacodyl (DULCOLAX) 5 MG EC tablet Take 1 tablet (5 mg total) by mouth daily.  . clopidogrel (PLAVIX) 75 MG tablet Take 1 tablet (75 mg total) by mouth daily.  Marland Kitchen docusate sodium 100 MG CAPS Take 100 mg by mouth 2 (two) times daily. (Patient taking differently: Take 100 mg by mouth at bedtime. )  . furosemide (LASIX) 20 MG tablet Take 1 tablet (20 mg total) by mouth daily. May take an additional 20 mg tablet if weight gain is greater than 3 lbs as needed daily  . metFORMIN (GLUCOPHAGE-XR) 500 MG 24 hr tablet Take 500 mg by mouth daily with breakfast.  . Multiple Vitamin (MULTIVITAMIN WITH MINERALS) TABS tablet Take 1 tablet by mouth every evening.   . nitroGLYCERIN (NITROSTAT) 0.4 MG SL tablet Place 1 tablet (0.4 mg total) under the tongue every 5 (five) minutes x 3 doses as needed for chest pain.  Marland Kitchen oxyCODONE-acetaminophen (PERCOCET/ROXICET)  5-325 MG per tablet Take 2 tablets by mouth every 6 (six) hours as needed for severe pain.   . potassium chloride SA (K-DUR) 20 MEQ tablet Take 20 mEq by mouth daily.   . prednisoLONE acetate (PRED FORTE) 1 % ophthalmic suspension Place 1 drop into both eyes at bedtime.   Marland Kitchen PROCTOFOAM HC rectal foam Place 1 applicator rectally 3 (three) times daily as needed for hemorrhoids or itching.   . rivaroxaban (XARELTO) 20 MG TABS tablet Take 1 tablet (20 mg total) by mouth daily with supper.  . tamsulosin (FLOMAX) 0.4 MG CAPS capsule Take 2 capsules (0.8 mg total) by mouth daily. (Patient taking differently: Take 0.4 mg by mouth daily. )  . valACYclovir (VALTREX) 500 MG tablet Take 500 mg by mouth 2 (two) times daily as needed (cold sores).  . [DISCONTINUED] furosemide (LASIX)  20 MG tablet Take 20 mg by mouth daily.   . [DISCONTINUED] lisinopril (ZESTRIL) 5 MG tablet Take 1 tablet (5 mg total) by mouth daily.  . [DISCONTINUED] metoprolol tartrate (LOPRESSOR) 25 MG tablet Take 0.5 tablets (12.5 mg total) by mouth 2 (two) times daily.    No Known Allergies  Social History   Tobacco Use  . Smoking status: Never Smoker  . Smokeless tobacco: Never Used  Substance Use Topics  . Alcohol use: No  . Drug use: No   Social History   Social History Narrative   Lives in [place] w/ his wife.  Active but does not routinely exercise.  Able to push-mow his yard.  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    family history includes Heart attack in his father.  Wt Readings from Last 3 Encounters:  12/02/18 179 lb 8 oz (81.4 kg)  10/30/18 179 lb (81.2 kg)  10/22/18 180 lb 4.8 oz (81.8 kg)    PHYSICAL EXAM BP (!) 91/52   Pulse 71   Temp 98.4 F (36.9 C) (Temporal)   Ht 6' (1.829 m)   Wt 179 lb 8 oz (81.4 kg)   SpO2 95%   BMI 24.34 kg/m  Physical Exam  Constitutional: He is oriented to person, place, and time. He appears well-developed and well-nourished. No distress.  Healthy-appearing.  Well-groomed  HENT:  Head: Normocephalic and atraumatic.  L eye blind  Neck: Normal range of motion. Neck supple. No hepatojugular reflux and no JVD present. Carotid bruit is not present.  Cardiovascular: Normal rate, regular rhythm, intact distal pulses and normal pulses.  Occasional extrasystoles are present. PMI is not displaced. Exam reveals no gallop and no friction rub.  No murmur heard. Pulmonary/Chest: Effort normal and breath sounds normal. No respiratory distress. He has no wheezes.  Abdominal: Soft. Bowel sounds are normal. He exhibits no distension. There is no abdominal tenderness. There is no rebound.  Musculoskeletal:        General: Edema (1-2+ left with trace 1+ right leg edema.) present.  Neurological: He is alert and oriented to person, place, and time.   Skin: Skin is warm and dry.  Psychiatric: He has a normal mood and affect. His behavior is normal. Judgment and thought content normal.  Vitals reviewed.    Adult ECG Report  Rate: 71 ;  Rhythm: normal sinus rhythm and normal axis intervals & durations;   Narrative Interpretation: normal EKG   Other studies Reviewed: Additional studies/ records that were reviewed today include:  Recent Labs:    Lab Results  Component Value Date   CHOL 138 10/19/2018   HDL 46 10/19/2018  LDLCALC 66 10/19/2018   TRIG 132 10/19/2018   CHOLHDL 3.0 10/19/2018   Lab Results  Component Value Date   CREATININE 0.91 11/10/2018   BUN 15 11/10/2018   NA 143 11/10/2018   K 4.3 11/10/2018   CL 104 11/10/2018   CO2 24 11/10/2018   Lab Results  Component Value Date   WBC 8.3 10/27/2018   HGB 14.4 10/27/2018   HCT 39.9 10/27/2018   MCV 93 10/27/2018   PLT 280 10/27/2018     ASSESSMENT / PLAN: Problem List Items Addressed This Visit    Pulmonary embolism (HCC) (Chronic)    With multifocal bilateral segmental and subsegmental pulmonary emboli, I suspect that he probably had a large DVT that mobilized.  Question if this occurred during his index hospitalization or prior to. Thankfully, there was no sign of any RV distress.  His current level of edema is stable. He is tolerating rivaroxaban at current dose.  Recommend continuing rivaroxaban for minimum of 6 months.      Relevant Medications   lisinopril (ZESTRIL) 2.5 MG tablet   furosemide (LASIX) 20 MG tablet   metoprolol succinate (TOPROL XL) 25 MG 24 hr tablet   Presence of drug-eluting stent in right coronary artery (Chronic)    Converted from aspirin plus ticagrelor to clopidogrel alone with the addition of rivaroxaban for pulmonary embolus.  Would continue uninterrupted clopidogrel for at least a year post MI and then would probably continue for least 1 more year post MI.      Relevant Orders   Lipid panel   Comprehensive metabolic  panel   Ischemic cardiomyopathy - Primary (Chronic)    EF at time of MI was 35 to 40%.  Not able to really tolerate the increased doses of ACE inhibitor.  Plan: Further reduce lisinopril to 2.5 mg daily; convert twice daily Toprol tartrate to once daily metoprolol succinate at 12.5 mg daily (thereby decreasing total daily dose by one half) Recheck 2D echo in September to reassess EF.      Relevant Medications   lisinopril (ZESTRIL) 2.5 MG tablet   furosemide (LASIX) 20 MG tablet   metoprolol succinate (TOPROL XL) 25 MG 24 hr tablet   Other Relevant Orders   Lipid panel   Comprehensive metabolic panel   Hypotension due to drugs    Concerning for an 84 year old on double anticoagulation to have blood pressures in the 90s.  He does complain about fatigue and dizziness.  Plan: Further reduce lisinopril to 2.5 mg daily and convert to once daily Toprol 12.5 mg - > Instructed the patient, that if he is feeling dizzy and lightheaded he should check his blood pressures before taking his lisinopril the morning.  If pressures are less than 100 mmHg, he should hold the lisinopril..      Relevant Medications   lisinopril (ZESTRIL) 2.5 MG tablet   furosemide (LASIX) 20 MG tablet   metoprolol succinate (TOPROL XL) 25 MG 24 hr tablet   Hyperlipidemia with target LDL less than 70 (Chronic)    Recent lipid profile in setting of MI was pretty well controlled, per patient's family he has been pretty well controlled long-term.  We will reassess lipid panel in August and if possible readjust his atorvastatin dose down to 40 mg.      Relevant Medications   lisinopril (ZESTRIL) 2.5 MG tablet   furosemide (LASIX) 20 MG tablet   metoprolol succinate (TOPROL XL) 25 MG 24 hr tablet   Other Relevant Orders  Lipid panel   Comprehensive metabolic panel   Coronary artery disease involving native coronary artery of native heart with unstable angina pectoris (HCC) (Chronic)    No further anginal symptoms  besides the one episode he had last week.  I am not sure if that was angina or related to his pulmonary emboli.  He really did not have that much in the way of other CAD noted on cath. Plan: Continue with Plavix monotherapy in conjunction with rivaroxaban to minimize bleeding.  Decrease ACE inhibitor dose (lisinopril) to 2.5 mg  Convert metoprolol tartrate 12.5 twice daily to Toprol (metoprolol succinate) 12.5 mg once daily.  For now continue high-dose of high intensity statin, but reassess lipids and adjust accordingly.      Relevant Medications   lisinopril (ZESTRIL) 2.5 MG tablet   furosemide (LASIX) 20 MG tablet   metoprolol succinate (TOPROL XL) 25 MG 24 hr tablet   Other Relevant Orders   Lipid panel   Comprehensive metabolic panel   Chronic systolic heart failure (HCC) (Chronic)    Acute finding of reduced EF without true heart failure symptoms.  Euvolemic essentially on exam besides his chronic edema.  He is on low-dose Lasix which we will continue.      Relevant Medications   lisinopril (ZESTRIL) 2.5 MG tablet   furosemide (LASIX) 20 MG tablet   metoprolol succinate (TOPROL XL) 25 MG 24 hr tablet   Acute ST elevation myocardial infarction (STEMI) of inferior wall (HCC) (Chronic)    Recent inferior STEMI with PCI to the RCA notably reduced EF.  Unfortunately shortly after discharge was readmitted with pulmonary embolus. He was discharged on multiple medications and he is now hypotensive.  We will need to titrate down his antihypertensive agents.  We will continue with low-dose of ACE inhibitor, beta-blocker.  Reassess lipids and adjust statin dose. No longer on aspirin and ticagrelor as he is now on rivaroxaban for pulmonary embolus..      Relevant Medications   lisinopril (ZESTRIL) 2.5 MG tablet   furosemide (LASIX) 20 MG tablet   metoprolol succinate (TOPROL XL) 25 MG 24 hr tablet      I spent a total of 30 minutes with the patient and chart review. >  50% of the  time was spent in direct patient consultation.  Additional 10 to 15-minute was spent in chart review reviewing cardiac catheterization films, echocardiographic images and chest CT images.  Chart was updated.  Patient has multiple medical issues as a new patient to me.  High level of medical medical thinking  Current medicines are reviewed at length with the patient today.  (+/- concerns) why is he on so much of so many medicines--statin, ACE inhibitor and beta-blocker predominantly. The following changes have been made:  See below  Patient Instructions  Medication Instructions:  Decrease  Lisinopril to 2.5 mg one tablet daily    Change to taking Toprol XL ( metoprolol succinate ) 12.5 mg ( 1/2 TABLET  AT BEDTIME .  Sliding scale Lasix ( furosemide): Weigh yourself when you get home, then Daily in the Morning. Your dry weight will be what your scale says on the day you return home.(here is 171 lbs.).   If you gain more than 3 pounds from dry weight: Increase the Lasix dosing to 20 mg in the morning and 20 mg in the afternoon until weight returns to baseline dry weight.  If weight gain is greater than 5 pounds in 2 days: Increased to Lasix 40 mg  in the morning and 20 mg in the afternoon  and contact the office for further assistance if weight does not go down the next day.  If the weight goes down more than 3 pounds from dry weight: Hold Lasix until it returns to baseline dry weight  If you need a refill on your cardiac medications before your next appointment, please call your pharmacy.   Lab work:  Not needed  Testing/Procedures:  Not needed-- keep  Appointment for Echo in Sept 2020 Follow-Up: At [facility], you and your health needs are our priority.  As part of our continuing mission to provide you with exceptional heart care, we have created designated Provider Care Teams.  These Care Teams include your primary Cardiologist (physician) and Advanced Practice Providers (APPs -   Physician Assistants and Nurse Practitioners) who all work together to provide you with the care you need, when you need it. . You will need TO KEEP  follow up appointment in Oct 2020.  Please call our office 2 months in advance to schedule this appointment.  You may see Glenetta Hew, MD or one of the following Advanced Practice Providers on your designated Care Team:   . Rosaria Ferries, PA-C . Jory Sims, DNP, ANP  Any Other Special Instructions Will Be Listed Below (If Applicable).    Studies Ordered:   Orders Placed This Encounter  Procedures  . Lipid panel  . Comprehensive metabolic panel      Glenetta Hew, M.D., M.S. Interventional Cardiologist   Pager # [phone] Phone # [phone] [address] [place], [place] [address]   Thank you for choosing Heart[facility] at [facility]!!

## 2018-12-04 ENCOUNTER — Encounter: Payer: Self-pay | Admitting: Cardiology

## 2018-12-04 ENCOUNTER — Other Ambulatory Visit (INDEPENDENT_AMBULATORY_CARE_PROVIDER_SITE_OTHER): Payer: Medicare Other

## 2018-12-04 DIAGNOSIS — I25110 Atherosclerotic heart disease of native coronary artery with unstable angina pectoris: Secondary | ICD-10-CM

## 2018-12-04 NOTE — Assessment & Plan Note (Signed)
No further anginal symptoms besides the one episode he had last week.  I am not sure if that was angina or related to his pulmonary emboli.  He really did not have that much in the way of other CAD noted on cath. Plan: Continue with Plavix monotherapy in conjunction with rivaroxaban to minimize bleeding.  Decrease ACE inhibitor dose (lisinopril) to 2.5 mg  Convert metoprolol tartrate 12.5 twice daily to Toprol (metoprolol succinate) 12.5 mg once daily.  For now continue high-dose of high intensity statin, but reassess lipids and adjust accordingly.

## 2018-12-04 NOTE — Assessment & Plan Note (Signed)
Concerning for an 84 year old on double anticoagulation to have blood pressures in the 90s.  He does complain about fatigue and dizziness.  Plan: Further reduce lisinopril to 2.5 mg daily and convert to once daily Toprol 12.5 mg - > Instructed the patient, that if he is feeling dizzy and lightheaded he should check his blood pressures before taking his lisinopril the morning.  If pressures are less than 100 mmHg, he should hold the lisinopril.Marland Kitchen

## 2018-12-04 NOTE — Assessment & Plan Note (Signed)
EF at time of MI was 35 to 40%.  Not able to really tolerate the increased doses of ACE inhibitor.  Plan: Further reduce lisinopril to 2.5 mg daily; convert twice daily Toprol tartrate to once daily metoprolol succinate at 12.5 mg daily (thereby decreasing total daily dose by one half) Recheck 2D echo in September to reassess EF.

## 2018-12-04 NOTE — Assessment & Plan Note (Signed)
Acute finding of reduced EF without true heart failure symptoms.  Euvolemic essentially on exam besides his chronic edema.  He is on low-dose Lasix which we will continue.

## 2018-12-04 NOTE — Assessment & Plan Note (Signed)
Converted from aspirin plus ticagrelor to clopidogrel alone with the addition of rivaroxaban for pulmonary embolus.  Would continue uninterrupted clopidogrel for at least a year post MI and then would probably continue for least 1 more year post MI.

## 2018-12-04 NOTE — Assessment & Plan Note (Signed)
Recent lipid profile in setting of MI was pretty well controlled, per patient's family he has been pretty well controlled long-term.  We will reassess lipid panel in August and if possible readjust his atorvastatin dose down to 40 mg.

## 2018-12-04 NOTE — Assessment & Plan Note (Signed)
Recent inferior STEMI with PCI to the RCA notably reduced EF.  Unfortunately shortly after discharge was readmitted with pulmonary embolus. He was discharged on multiple medications and he is now hypotensive.  We will need to titrate down his antihypertensive agents.  We will continue with low-dose of ACE inhibitor, beta-blocker.  Reassess lipids and adjust statin dose. No longer on aspirin and ticagrelor as he is now on rivaroxaban for pulmonary embolus.Marland Kitchen

## 2018-12-04 NOTE — Assessment & Plan Note (Signed)
With multifocal bilateral segmental and subsegmental pulmonary emboli, I suspect that he probably had a large DVT that mobilized.  Question if this occurred during his index hospitalization or prior to. Thankfully, there was no sign of any RV distress.  His current level of edema is stable. He is tolerating rivaroxaban at current dose.  Recommend continuing rivaroxaban for minimum of 6 months.

## 2019-02-05 ENCOUNTER — Other Ambulatory Visit (HOSPITAL_COMMUNITY): Payer: Medicare Other

## 2019-02-17 ENCOUNTER — Other Ambulatory Visit: Payer: Self-pay

## 2019-02-17 ENCOUNTER — Ambulatory Visit (HOSPITAL_COMMUNITY): Payer: Medicare Other | Attending: Cardiology

## 2019-02-17 DIAGNOSIS — I2119 ST elevation (STEMI) myocardial infarction involving other coronary artery of inferior wall: Secondary | ICD-10-CM | POA: Diagnosis present

## 2019-02-17 HISTORY — PX: TRANSTHORACIC ECHOCARDIOGRAM: SHX275

## 2019-02-18 ENCOUNTER — Telehealth: Payer: Self-pay | Admitting: Cardiology

## 2019-02-18 LAB — COMPREHENSIVE METABOLIC PANEL
ALT: 36 IU/L (ref 0–44)
AST: 35 IU/L (ref 0–40)
Albumin/Globulin Ratio: 2.3 — ABNORMAL HIGH (ref 1.2–2.2)
Albumin: 4.2 g/dL (ref 3.6–4.6)
Alkaline Phosphatase: 132 IU/L — ABNORMAL HIGH (ref 39–117)
BUN/Creatinine Ratio: 11 (ref 10–24)
BUN: 11 mg/dL (ref 8–27)
Bilirubin Total: 0.7 mg/dL (ref 0.0–1.2)
CO2: 25 mmol/L (ref 20–29)
Calcium: 9.5 mg/dL (ref 8.6–10.2)
Chloride: 103 mmol/L (ref 96–106)
Creatinine, Ser: 0.99 mg/dL (ref 0.76–1.27)
GFR calc Af Amer: 82 mL/min/{1.73_m2} (ref >59)
GFR calc non Af Amer: 71 mL/min/{1.73_m2} (ref >59)
Globulin, Total: 1.8 g/dL (ref 1.5–4.5)
Glucose: 146 mg/dL — ABNORMAL HIGH (ref 65–99)
Potassium: 4.4 mmol/L (ref 3.5–5.2)
Sodium: 144 mmol/L (ref 134–144)
Total Protein: 6 g/dL (ref 6.0–8.5)

## 2019-02-18 LAB — LIPID PANEL
Chol/HDL Ratio: 1.7 ratio (ref 0.0–5.0)
Cholesterol, Total: 109 mg/dL (ref 100–199)
HDL: 63 mg/dL (ref >39)
LDL Chol Calc (NIH): 28 mg/dL (ref 0–99)
Triglycerides: 92 mg/dL (ref 0–149)
VLDL Cholesterol Cal: 18 mg/dL (ref 5–40)

## 2019-02-18 NOTE — Telephone Encounter (Signed)
New message   Patient's daughter is calling to make sure that the echo results get sent to Dr. Ellyn Hack.

## 2019-02-22 NOTE — Telephone Encounter (Signed)
Daughter is aware Dr Ellyn Hack will be able to see echo report  - appt 03/03/19

## 2019-02-24 ENCOUNTER — Other Ambulatory Visit: Payer: Self-pay | Admitting: Cardiology

## 2019-03-01 ENCOUNTER — Ambulatory Visit: Payer: Medicare Other | Admitting: Cardiology

## 2019-03-03 ENCOUNTER — Other Ambulatory Visit: Payer: Self-pay

## 2019-03-03 ENCOUNTER — Encounter: Payer: Self-pay | Admitting: Cardiology

## 2019-03-03 ENCOUNTER — Ambulatory Visit (INDEPENDENT_AMBULATORY_CARE_PROVIDER_SITE_OTHER): Payer: Medicare Other | Admitting: Cardiology

## 2019-03-03 VITALS — BP 113/61 | HR 79 | Ht 72.0 in | Wt 179.0 lb

## 2019-03-03 DIAGNOSIS — I255 Ischemic cardiomyopathy: Secondary | ICD-10-CM | POA: Diagnosis not present

## 2019-03-03 DIAGNOSIS — Z955 Presence of coronary angioplasty implant and graft: Secondary | ICD-10-CM

## 2019-03-03 DIAGNOSIS — E785 Hyperlipidemia, unspecified: Secondary | ICD-10-CM

## 2019-03-03 DIAGNOSIS — Z86711 Personal history of pulmonary embolism: Secondary | ICD-10-CM

## 2019-03-03 DIAGNOSIS — I952 Hypotension due to drugs: Secondary | ICD-10-CM

## 2019-03-03 DIAGNOSIS — I25119 Atherosclerotic heart disease of native coronary artery with unspecified angina pectoris: Secondary | ICD-10-CM

## 2019-03-03 MED ORDER — ATORVASTATIN CALCIUM 40 MG PO TABS: 40.0000 mg | ORAL_TABLET | Freq: Every day | ORAL | 3 refills | Status: DC

## 2019-03-03 MED ORDER — ATORVASTATIN CALCIUM 80 MG PO TABS: 40.0000 mg | ORAL_TABLET | Freq: Every day | ORAL | 2 refills | Status: DC

## 2019-03-03 NOTE — Patient Instructions (Addendum)
Medication Instructions:  HOLD YOUR CHOLESTEROL MEDICTION FOR 1 WEEK THEN DECREASE TO 40 MG . IF MEMORY IS STILL AN ISSUE    AFTER  ONE MONTH  TAKING MEDICATION- HOLD MEDICATION FOR 2 WEEKS   AND CALL IF  STILL HAVING MEMORY    IF BLOOD PRESSURE IS BELOW 308/ SYSTOLIC THEN DO NOT TAKE LISINOPRIL THAT DAY   If you need a refill on your cardiac medications before your next appointment, please call your pharmacy.   Lab work: FASTING LABS -LIPID,CMP YOU CAN DO LABS THE DAY OF YOUR NEXT APPOINTMENT  If you have labs (blood work) drawn today and your tests are completely normal, you will receive your results only by: Marland Kitchen MyChart Message (if you have MyChart) OR . A paper copy in the mail If you have any lab test that is abnormal or we need to change your treatment, we will call you to review the results.  Testing/Procedures:  NOT NEEDED  Follow-Up: At [facility], you and your health needs are our priority.  As part of our continuing mission to provide you with exceptional heart care, we have created designated Provider Care Teams.  These Care Teams include your primary Cardiologist (physician) and Advanced Practice Providers (APPs -  Physician Assistants and Nurse Practitioners) who all work together to provide you with the care you need, when you need it. You will need a follow up appointment in 6 months.  Please call our office 2 months in advance to schedule this appointment.  You may see Glenetta Hew, MD or one of the following Advanced Practice Providers on your designated Care Team:   Rosaria Ferries, PA-C . Jory Sims, DNP, ANP  Any Other Special Instructions Will Be Listed Below (If Applicable). None

## 2019-03-03 NOTE — Progress Notes (Addendum)
PCP: Gordon Sacramento, MD  Clinic Note: Chief Complaint  Patient presents with   Follow-up    Doing well.  Here for echocardiogram review and cholesterol results   Coronary Artery Disease   Cardiomyopathy    EF now improved by echo    HPI: Gordon Novak is a 84 y.o. male with with recent inferior-inferolateral STEMI (PCI to RCA Oct 18, 2018) followed shortly thereafter by subsegmental PE (2 days post discharge) who presents now for 82-monthfollow-up and to discuss results of echocardiogram.  Gordon Novak's daughter's father-in-law is a former patient of mine, as a result, they asked to be switched from Dr. BGwenlyn Novak(who performed his catheterization and PCI in the setting of STEMI) as primary cardiologist to me.  May 24-26 2020: Acute inferior-inferolateral STEMI (RCA -> DES PCI to RCA.  Oct 22, 2018: Subsegmental pulmonary emboli; curbside by cardiology suggested converting from Brilinta to Plavix and start Xarelto.  Gordon [name] last seen on December 02, 2018 (to establish as his new primary cardiologist, and to clarify medications and indications.)  He typically comes in with his daughter.  Had one episode of chest pain relieved with nitroglycerin but then none further.  Felt tired with reduced energy levels.  Rare skipped beats.  Mostly felt dizzy and weak especially standing up.  Left greater than right leg swelling.  Does not like diuretics. --> Further reduced lisinopril 2.5 mg daily and Toprol to 12.5 mg nightly. --> Standing dose of 20 mg Lasix with sliding scale based on weights.  Recent Hospitalizations:   None  Studies Personally Reviewed - (if available, images/films reviewed: From Epic Chart or Care Everywhere)  2 D Echo 02/17/2019: EF 50 to 55%.  Residual basal-mid inferolateral and apical lateral as well as basal septal hypokinesis.  Normal RV size and function.  Relatively normal atrial size.  Moderate aortic sclerosis/calcification but no stenosis.  Bowed interatrial  septum suggesting elevated RAP.  Mildly elevated PAP.--Significant improvement from prior EF.    Interval History: Gordon Novak being seen today along with his daughter.  They have met multiple questions about his medications.  He says he had one episode of last week where he has some chest discomfort that after some physical activity that was relieved with nitroglycerin.  Otherwise he has not had any chest pain or pressure with rest or exertion.  The most thing he notes is that he just feels tired and and low energy compared to last year.  He also says he may feel some skipping beats but nothing like prolonged rapid irregular heartbeat episode. He is felt quite dizzy and weak especially with standing up, with lack of get up and go.  He has not had any syncope or real near syncope though. He says that he always has left leg swelling more than the right, and it still present but not that bad.  He does not like taking diuretics.  No TIA/amaurosis fugax symptoms. No melena, hematochezia, hematuria, or epstaxis. No claudication.  His daughter has been tracking his blood pressure and heart rate as well as weight on a spreadsheet.  Blood pressures ranged from as low as 94/54-100 20/66.  Heart rates are anywhere from 59 to 80 bpm.  Weight has been averaging between 171 to 173 pounds.  As such, he has maybe taken extra Lasix about 2 or 3 days in the last 3 months.  ROS: A comprehensive was performed. Review of Systems  Constitutional: Negative for malaise/fatigue (Just has lower  energy than usual.).  HENT: Negative for congestion.   Respiratory: Negative for cough and shortness of breath.   Cardiovascular: Positive for leg swelling (Chronic left greater than right).  Gastrointestinal: Negative for abdominal pain, constipation, diarrhea and heartburn.  Genitourinary: Negative for dysuria.  Musculoskeletal: Positive for joint pain. Negative for falls.  Neurological: Positive for dizziness (And weak) and  weakness. Negative for focal weakness.  Endo/Heme/Allergies: Does not bruise/bleed easily.  Psychiatric/Behavioral: Negative for depression and memory loss. The patient is not nervous/anxious and does not have insomnia.   All other systems reviewed and are negative.   I have reviewed and (if needed) personally updated the patient's problem list, medications, allergies, past medical and surgical history, social and family history.   Past Medical History:  Diagnosis Date   Acute systolic heart failure (HCC)    35-40%   Bladder outlet obstruction    Blindness of left eye    CAD S/P percutaneous coronary angioplasty 10/22/2018   Cath-PCA 10/18/2018: 100% mRCA -- (DES PCI-Synergy 3 mm x 28 mm - 3.3 mm).  Otherwise normal coronaries   Chronic Left leg swelling    a. Since 2007.   Essential hypertension    Gout    Hemorrhoids 05/12/2014   Ischemic cardiomyopathy--EF now resolved to 50 to 55%. 10/30/2018   Post inferior STEMI: TTE 10/18/2018: EF 35-40%, diffuse hypokinesis.  GR 1 DD --> resolved 4 months post MI. -Residual inferior and inferolateral and inferoseptal hypokinesis.   Nephrolithiasis    PE (pulmonary thromboembolism) ([place]) 10/22/2018   ST elevation myocardial infarction (STEMI) of inferior wall ([place]) 10/18/2018    PCI/DES x1 to the mRCA   Type 2 diabetes mellitus ([place])     Past Surgical History:  Procedure Laterality Date   CATARACT EXTRACTION     CORONARY/GRAFT ACUTE MI REVASCULARIZATION N/A 10/18/2018   Procedure: Coronary/Graft Acute MI Revascularization - Cornonary Stent Intervention;  Surgeon: Lorretta Harp, MD;  Location: [facility];;  100% mRCA -- (DES PCI-Synergy 3 mm x 28 mm - 3.3 mm).     CYSTOSCOPY W/ URETERAL STENT PLACEMENT Left 05/07/2014   Procedure: CYSTOSCOPY WITH RETROGRADE PYELOGRAM/ LEFT URETERAL STENT PLACEMENT;  Surgeon: Ardis Hughs, MD;  Location: [facility];  Service: Urology;  Laterality: Left;   CYSTOSCOPY WITH  RETROGRADE PYELOGRAM, URETEROSCOPY AND STENT PLACEMENT Left 05/14/2014   Procedure: LEFT URETEROSCOPY AND STENT EXCHANGE,STONE BASKETRY EXTRACTION;  Surgeon: Festus Aloe, MD;  Location: [facility];  Service: Urology;  Laterality: Left;   HOLMIUM LASER APPLICATION Left 16/02/9603   Procedure: HOLMIUM LASER APPLICATION;  Surgeon: Festus Aloe, MD;  Location: [facility];  Service: Urology;  Laterality: Left;   INTRAOCULAR LENS INSERTION Right    LEFT HEART CATH AND CORONARY ANGIOGRAPHY N/A 10/18/2018   Procedure: LEFT HEART CATH AND CORONARY ANGIOGRAPHY;  Surgeon: Lorretta Harp, MD;  Location: [facility];;  100% mRCA -- (DES PCI)..  Otherwise normal coronaries   TRANSTHORACIC ECHOCARDIOGRAM  02/17/2019   EF 50 to 55%.  Residual basal-mid inferolateral and apical lateral as well as basal septal hypokinesis.  Normal RV size and function.  Relatively normal atrial size.  Moderate aortic sclerosis/calcification but no stenosis.  Bowed interatrial septum suggesting elevated RAP.  Mildly elevated PAP.--Significant improvement -> prior EF (initially 35 to 40%) in setting of Inf-lat STEMI May 2020    Current Meds  Medication Sig   allopurinol (ZYLOPRIM) 300 MG tablet Take 300 mg by mouth daily.    beta carotene w/minerals (OCUVITE)  tablet Take 1 tablet by mouth daily.    bisacodyl (DULCOLAX) 5 MG EC tablet Take 1 tablet (5 mg total) by mouth daily.   clopidogrel (PLAVIX) 75 MG tablet Take 1 tablet (75 mg total) by mouth daily.   docusate sodium 100 MG CAPS Take 100 mg by mouth 2 (two) times daily. (Patient taking differently: Take 100 mg by mouth at bedtime. )   furosemide (LASIX) 20 MG tablet Take 1 tablet (20 mg total) by mouth daily. May take an additional 20 mg tablet if weight gain is greater than 3 lbs as needed daily   metFORMIN (GLUCOPHAGE-XR) 500 MG 24 hr tablet Take 500 mg by mouth daily with breakfast.   metoprolol succinate (TOPROL-XL) 25 MG 24 hr tablet TAKE 1/2 TABLET  (12.5 MG TOTAL) BY MOUTH AT BEDTIME.   Multiple Vitamin (MULTIVITAMIN WITH MINERALS) TABS tablet Take 1 tablet by mouth every evening.    nitroGLYCERIN (NITROSTAT) 0.4 MG SL tablet Place 1 tablet (0.4 mg total) under the tongue every 5 (five) minutes x 3 doses as needed for chest pain.   potassium chloride SA (K-DUR) 20 MEQ tablet Take 20 mEq by mouth daily.    prednisoLONE acetate (PRED FORTE) 1 % ophthalmic suspension Place 1 drop into both eyes at bedtime.    PROCTOFOAM HC rectal foam Place 1 applicator rectally 3 (three) times daily as needed for hemorrhoids or itching.    rivaroxaban (XARELTO) 20 MG TABS tablet Take 1 tablet (20 mg total) by mouth daily with supper.   tamsulosin (FLOMAX) 0.4 MG CAPS capsule Take 2 capsules (0.8 mg total) by mouth daily. (Patient taking differently: Take 0.4 mg by mouth daily. )   valACYclovir (VALTREX) 500 MG tablet Take 500 mg by mouth 2 (two) times daily as needed (cold sores).   [DISCONTINUED] atorvastatin (LIPITOR) 80 MG tablet Take 1 tablet (80 mg total) by mouth daily at 6 PM.   [DISCONTINUED] atorvastatin (LIPITOR) 80 MG tablet Take 0.5 tablets (40 mg total) by mouth daily at 6 PM.    No Known Allergies  Social History   Tobacco Use   Smoking status: Never Smoker   Smokeless tobacco: Never Used  Substance Use Topics   Alcohol use: No   Drug use: No   Social History   Social History Narrative   Lives in [place] w/ his wife.  Active but does not routinely exercise.  Able to push-mow his yard.  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    family history includes Heart attack in his father.  Wt Readings from Last 3 Encounters:  03/03/19 179 lb (81.2 kg)  12/02/18 179 lb 8 oz (81.4 kg)  10/30/18 179 lb (81.2 kg)    PHYSICAL EXAM BP 113/61    Pulse 79    Ht 6' (1.829 m)    Wt 179 lb (81.2 kg)    SpO2 94%    BMI 24.28 kg/m  Physical Exam  Constitutional: He is oriented to person, place, and time. He appears well-developed and  well-nourished. No distress.  Healthy-appearing.  Well-groomed elderly gentleman.  Appropriately wearing facemask.  HENT:  Head: Normocephalic and atraumatic.  Eyes:  L eye blind with reduced extraocular motor function  Neck: Normal range of motion. Neck supple. No hepatojugular reflux and no JVD present. Carotid bruit is not present.  Cardiovascular: Normal rate, regular rhythm, normal heart sounds, intact distal pulses and normal pulses.  No extrasystoles are present. PMI is not displaced. Exam reveals no gallop and no friction  rub.  No murmur heard. Pulmonary/Chest: Effort normal and breath sounds normal. No respiratory distress. He has no wheezes.  Abdominal: Soft. Bowel sounds are normal. He exhibits no distension. There is no abdominal tenderness. There is no rebound.  Musculoskeletal:        General: Edema (1-2+ left with trace  right leg edema. -->  Not wearing his support stockings today.) present.  Neurological: He is alert and oriented to person, place, and time.  Psychiatric: He has a normal mood and affect. His behavior is normal. Judgment and thought content normal.  Vitals reviewed.    Adult ECG Report --Not checked  Other studies Reviewed: Additional studies/ records that were reviewed today include:  Recent Labs:    Lab Results  Component Value Date   CHOL 109 02/17/2019   HDL 63 02/17/2019   LDLCALC 28 02/17/2019   TRIG 92 02/17/2019   CHOLHDL 1.7 02/17/2019   Lab Results  Component Value Date   CREATININE 0.99 02/17/2019   BUN 11 02/17/2019   NA 144 02/17/2019   K 4.4 02/17/2019   CL 103 02/17/2019   CO2 25 02/17/2019   Lab Results  Component Value Date   WBC 8.3 10/27/2018   HGB 14.4 10/27/2018   HCT 39.9 10/27/2018   MCV 93 10/27/2018   PLT 280 10/27/2018     ASSESSMENT / PLAN: Problem List Items Addressed This Visit    Personal history of PE (pulmonary embolism) (Chronic)    Very unfortunate circumstance shortly after her MI.  Was switched  from Brilinta to Plavix and started on Xarelto.  Follow-up echo shows maybe mild RA dilation but no evidence of RV strain. Plan: Continue Xarelto for minimum of 6 months, however may want to consider an entire year before stopping given the extent of emboli.      Coronary artery disease involving native coronary artery of native heart with angina pectoris ([place]) - Primary (Chronic)    He is only used nitroglycerin once since I last saw him.  3 times total.  Doing very well otherwise.  He is somewhat active.  Notable improvement in EF. He knows that he needs to probably do more exercise, but he is getting out and doing gardening etc.  No real anginal symptoms with exertion.  Interestingly, no other significant disease besides the RCA.  Plan: Converted from [place] to clopidogrel because of PE requiring DOAC (Xarelto) --> would like to have uninterrupted Thienopyridine antiplatelet agent for the first year which would be through May 2021 unless procedures are urgent.  Doing better on lower dose of ACE inhibitor.  Continue current dose but hold for SBP<100 mmHg  Continue low-dose beta-blocker taking in the evening.  Continue statin, but okay to reduce dose.      Relevant Medications   atorvastatin (LIPITOR) 40 MG tablet   Other Relevant Orders   Lipid panel   Comprehensive metabolic panel   Ischemic cardiomyopathy (Chronic)    Essentially resolved now EF is back up to borderline normal.  No active heart failure symptoms.  Continue current low-dose of ACE inhibitor and beta-blocker. Also continue low-dose furosemide with sliding scale.  He says he has had to take a few extra doses here and then but not that much.      Relevant Medications   atorvastatin (LIPITOR) 40 MG tablet   Other Relevant Orders   Lipid panel   Comprehensive metabolic panel   Presence of drug-eluting stent in right coronary artery (Chronic)    Remains  on Plavix/clopidogrel alone along with  rivaroxaban/Xarelto. This was in an response to subsequent PE after MI.  Plan uninterrupted to Plavix/clopidogrel of the first year at which point it can be held.      Hyperlipidemia with target LDL less than 70 (Chronic)    Amazing drop in LDL since his MI.  Plan was to potentially reduce his statin. He is also having a little bit of memory issues according to the daughter.  Plan for now will be to hold statin completely for 1 week and then decrease to 40 mg.  If after a month at current dose the memory issues are still present, we would then have him hold it for 2 weeks to see if memory issues improved.  If they do we will switch to likely 20 mg rosuvastatin or potentially pravastatin 40 mg.  Will be to have labs checked prior to follow-up appointment      Relevant Medications   atorvastatin (LIPITOR) 40 MG tablet   Other Relevant Orders   Lipid panel   Comprehensive metabolic panel   Hypotension due to drugs    Doing much better on reduced dose of lisinopril. Gave instructions to hold lisinopril for SBP<100 mmHg.  Would also hold Lasix      Relevant Medications   atorvastatin (LIPITOR) 40 MG tablet      I spent a total of 55mnutes with the patient and chart review. >  50% of the time was spent in direct patient consultation.  We talked for about 5 to 10 minutes about concerns with labile blood pressures as well as some borderline memory issues.  Plan is to allow for his pressures to be a little bit higher and to back off on his medications in general.  Current medicines are reviewed at length with the patient today.  (+/- concerns) : No major questions  Patient Instructions  Medication Instructions:  HOLD YOUR CHOLESTEROL MEDICTION FOR 1 WEEK THEN DECREASE TO 40 MG . IF MEMORY IS STILL AN ISSUE    AFTER  ONE MONTH  TAKING MEDICATION- HOLD MEDICATION FOR 2 WEEKS   AND CALL IF  STILL HAVING MEMORY    IF BLOOD PRESSURE IS BELOW 1062/SYSTOLIC THEN DO NOT TAKE LISINOPRIL THAT  DAY   If you need a refill on your cardiac medications before your next appointment, please call your pharmacy.   Lab work: FASTING LABS -LIPID,CMP YOU CAN DO LABS THE DAY OF YOUR NEXT APPOINTMENT  If you have labs (blood work) drawn today and your tests are completely normal, you will receive your results only by:  MyChart Message (if you have MyChart) OR  A paper copy in the mail If you have any lab test that is abnormal or we need to change your treatment, we will call you to review the results.  Testing/Procedures:  NOT NEEDED  Follow-Up: [facility] and your health needs are our priority.  As part of our continuing mission to provide you with exceptional heart care, we have created designated Provider Care Teams.  These Care Teams include your primary Cardiologist (physician) and Advanced Practice Providers (APPs -  Physician Assistants and Nurse Practitioners) who all work together to provide you with the care you need, when you need it. You will need a follow up appointment in 6 months.  Please call our office 2 months in advance to schedule this appointment.  You may see DGlenetta Hew MD or one of the following Advanced Practice Providers on your  designated Care Team:   Rosaria Ferries, PA-C  Jory Sims, DNP, ANP  Any Other Special Instructions Will Be Listed Below (If Applicable). None    Studies Ordered:   Orders Placed This Encounter  Procedures   Lipid panel   Comprehensive metabolic panel      Glenetta Hew, M.D., M.S. Interventional Cardiologist   Pager # [phone] Phone # [phone] [address] [place], [place] [address]   Thank you for choosing Heartcare at [facility]!!

## 2019-03-05 ENCOUNTER — Encounter: Payer: Self-pay | Admitting: Cardiology

## 2019-03-05 NOTE — Assessment & Plan Note (Signed)
Remains on Plavix/clopidogrel alone along with rivaroxaban/Xarelto. This was in an response to subsequent PE after MI.  Plan uninterrupted to Plavix/clopidogrel of the first year at which point it can be held.

## 2019-03-05 NOTE — Assessment & Plan Note (Signed)
Very unfortunate circumstance shortly after her MI.  Was switched from Brilinta to Plavix and started on Xarelto.  Follow-up echo shows maybe mild RA dilation but no evidence of RV strain. Plan: Continue Xarelto for minimum of 6 months, however may want to consider an entire year before stopping given the extent of emboli.

## 2019-03-05 NOTE — Assessment & Plan Note (Signed)
He is only used nitroglycerin once since I last saw him.  3 times total.  Doing very well otherwise.  He is somewhat active.  Notable improvement in EF. He knows that he needs to probably do more exercise, but he is getting out and doing gardening etc.  No real anginal symptoms with exertion.  Interestingly, no other significant disease besides the RCA.  Plan: Converted from [place] to clopidogrel because of PE requiring DOAC (Xarelto) --> would like to have uninterrupted Thienopyridine antiplatelet agent for the first year which would be through May 2021 unless procedures are urgent.  Doing better on lower dose of ACE inhibitor.  Continue current dose but hold for SBP<100 mmHg  Continue low-dose beta-blocker taking in the evening.  Continue statin, but okay to reduce dose.

## 2019-03-05 NOTE — Assessment & Plan Note (Signed)
Doing much better on reduced dose of lisinopril. Gave instructions to hold lisinopril for SBP<100 mmHg.  Would also hold Lasix

## 2019-03-05 NOTE — Assessment & Plan Note (Addendum)
Amazing drop in LDL since his MI.  Plan was to potentially reduce his statin. He is also having a little bit of memory issues according to the daughter.  Plan for now will be to hold statin completely for 1 week and then decrease to 40 mg.  If after a month at current dose the memory issues are still present, we would then have him hold it for 2 weeks to see if memory issues improved.  If they do we will switch to likely 20 mg rosuvastatin or potentially pravastatin 40 mg.  Will be to have labs checked prior to follow-up appointment

## 2019-03-05 NOTE — Assessment & Plan Note (Addendum)
Essentially resolved now EF is back up to borderline normal.  No active heart failure symptoms.  Continue current low-dose of ACE inhibitor and beta-blocker. Also continue low-dose furosemide with sliding scale.  He says he has had to take a few extra doses here and then but not that much.

## 2019-03-17 ENCOUNTER — Other Ambulatory Visit: Payer: Self-pay | Admitting: Cardiovascular Disease

## 2019-05-18 ENCOUNTER — Telehealth: Payer: Self-pay | Admitting: Cardiology

## 2019-05-18 NOTE — Telephone Encounter (Signed)
New message:     Patient calling stating that he was taken off his arthritis medication and the patient is in pain and would like to know if it is something else he could take.

## 2019-05-18 NOTE — Telephone Encounter (Signed)
Returned call to patient's daughter Shirlean Mylar.She stated father was taken off Voltaren in hospital.Stated he has arthritis pain in neck and needs to take something.Advised since he is on Xarelto he cannot take Voltaren.Advised to take arthritis strength tylenol.

## 2019-06-02 ENCOUNTER — Telehealth: Payer: Self-pay | Admitting: Cardiology

## 2019-06-02 NOTE — Telephone Encounter (Signed)
New Message     [name] is calling and is wondering if the pt needs to get the COVID vaccine     Please call

## 2019-06-02 NOTE — Telephone Encounter (Signed)
New Message     *STAT* If patient is at the pharmacy, call can be transferred to refill team.   1. Which medications need to be refilled? (please list name of each medication and dose if known) XARELTO 20 MG TABS tablet  2. Which pharmacy/location (including street and city if local pharmacy) is medication to be sent to? [id number] - [place], [place] - [address]  3. Do they need a 30 day or 90 day supply? 30 or 90   [name] is calling and says the pt needs this medication refilled. She says they have gotten it for $10 and she is needing the same coupon to be applied

## 2019-06-03 NOTE — Telephone Encounter (Signed)
I would say that given his age alone, he would be somebody who should get the Covid vaccine, but with his history of coronary disease, he is even at higher risk.  The shot is relatively safe compared to the alternative.   Bryan Lemma, MD

## 2019-06-04 MED ORDER — RIVAROXABAN 20 MG PO TABS: 20.0000 mg | ORAL_TABLET | Freq: Every day | ORAL | 1 refills | Status: DC

## 2019-06-04 NOTE — Telephone Encounter (Signed)
Spoke with [name] regarding Dr. Erich Montane recommendation for getting the COVID vaccine, she verbalized understanding and thanked me for the call.

## 2019-06-08 ENCOUNTER — Telehealth: Payer: Self-pay | Admitting: Cardiology

## 2019-06-08 MED ORDER — METOPROLOL SUCCINATE ER 25 MG PO TB24: ORAL_TABLET | ORAL | 3 refills | Status: AC

## 2019-06-08 NOTE — Telephone Encounter (Signed)
SPOKE TO DAUGHTER-  NEW RX FOR METOPROLOL SUCCINATE  E-SENT TO PHARMACY.  DAUGHTER MENTION PATIENT NEEDS  SAVING CARD FOR XARELTO - DAUGHTER STATES PATIENT HAS PRIVATE INSURANCE WHICH PAYS FOR MEDICATION  INFORMED DAUGHTER- TO GO ONLINE AN PRINT SAVING CARD FOR XARELTO  AND TAKE TO PHARMACY IN ISSUE CALL OFFICE BACK  [name] VOICE UNDERSTANDING.

## 2019-06-08 NOTE — Telephone Encounter (Signed)
New Message:      Please call, question about pt's Metoprolol.

## 2019-06-08 NOTE — Telephone Encounter (Signed)
LEFT MESSAGE TO CALL BACK

## 2019-06-22 ENCOUNTER — Telehealth: Payer: Self-pay | Admitting: Cardiology

## 2019-06-22 NOTE — Telephone Encounter (Signed)
Confirmed fax number for [facility] [phone]

## 2019-06-22 NOTE — Telephone Encounter (Signed)
Fax number [phone]

## 2019-06-22 NOTE — Telephone Encounter (Signed)
  1. What dental office are you calling from? [facility]  2. What is your office phone number? [phone]  3. What is your fax number?   4. What type of procedure is the patient having performed? Tooth extraction  5. What date is procedure scheduled or is the patient there now? 06/23/19 (if the patient is at the dentist's office question goes to their cardiologist if he/she is in the office.  If not, question should go to the DOD).   6. What is your question (ex. Antibiotics prior to procedure, holding medication-we need to know how long dentist wants pt to hold med)? Does patient to hold Xarelto and how long

## 2019-06-22 NOTE — Telephone Encounter (Signed)
Pharmacy, this pt has a history of STEMI & DES 09/2018 and needs to be on uninterrupted Plavix for 1 year through May 2021 per Dr. Elissa Hefty office note.  Pt also had subsegmental pulmonary Emboli 10/22/2018 and was started on Xarelto. For simple dental extractions we usually do not recommend holding any anticoagulants.  With his dual therapy, do you agree with this recommendation or could the Xarelto be held to reduce bleeding?

## 2019-06-22 NOTE — Telephone Encounter (Signed)
I think you are still fine to recommend NOT holding

## 2019-06-22 NOTE — Telephone Encounter (Signed)
   Primary Cardiologist: Bryan Lemma, MD  Chart reviewed as part of pre-operative protocol coverage. Simple dental extractions are considered low risk procedures per guidelines and generally do not require any specific cardiac clearance. It is also generally accepted that for simple extractions and dental cleanings, there is no need to interrupt blood thinner therapy. Please do not interrupt Plavix or Xarelto.   SBE prophylaxis is not required for the patient.  I will route this recommendation to the requesting party via Epic fax function and remove from pre-op pool.  Please call with questions.  Berton Bon, NP 06/22/2019, 1:44 PM

## 2019-06-25 ENCOUNTER — Telehealth: Payer: Self-pay | Admitting: Cardiology

## 2019-06-25 DIAGNOSIS — E785 Hyperlipidemia, unspecified: Secondary | ICD-10-CM

## 2019-06-25 NOTE — Telephone Encounter (Signed)
  Pt c/o medication issue:  1. Name of Medication: lisinopril (ZESTRIL) 2.5 MG tablet  2. How are you currently taking this medication (dosage and times per day)? Once a day  3. Are you having a reaction (difficulty breathing--STAT)? no  4. What is your medication issue? Patient's daughter states the medication is still messing with the patient's mind. She says he can't remember well and gets very agitated and angry. She states his dose was cut in half but he is still having the problem. She would like a call back with advise.

## 2019-06-25 NOTE — Telephone Encounter (Signed)
Please advise, patient is having issues with the medication- can it cause memory issues?   Will route to PharmD to advise.  Thank you!

## 2019-06-25 NOTE — Telephone Encounter (Signed)
Lisinopril in NOT associated with memory loss. Some studies sugest benefits on memory protection ,but is daughter still worried about Gordon Novak memory problems, she can HOLD lisinopril dose for 3-4 days to assess response.  Is memory issues not improved after 4 days off the medication, medication should resume immediatly after. If memory issues improve, please contact us to initiate alternative therapy.  Possible alternative (olmesartan)

## 2019-06-25 NOTE — Telephone Encounter (Signed)
Called patient and spoke to daughter- she states it was not the Lisinopril, it was the Lipitor- she did not say the correct medication.  Patient states he had this problem before, and the cut dose down, but not it has come back- his last note states to change to another medication, but unsure if this is still the plan. Please advise. Will route back to PharmD and MD to advise of plan.  Thank you!

## 2019-06-25 NOTE — Telephone Encounter (Signed)
Called and notified daughter- She verbalized understanding.  Blood work ordered.  Medication added to allergy list.

## 2019-06-25 NOTE — Telephone Encounter (Signed)
Okay. STOP taking Lipitor (d/c from med list) and add to allergy list. Repeat fasting lipid panel in 2 months.   We can recommend alternative therapy once we know cholesterol level without medication.

## 2019-06-26 NOTE — Telephone Encounter (Signed)
I am fine with stopping statin altogether.  Bryan Lemma, MD

## 2019-06-28 NOTE — Telephone Encounter (Signed)
Noted  

## 2019-08-23 ENCOUNTER — Telehealth: Payer: Self-pay | Admitting: Cardiology

## 2019-08-23 DIAGNOSIS — I25119 Atherosclerotic heart disease of native coronary artery with unspecified angina pectoris: Secondary | ICD-10-CM

## 2019-08-23 DIAGNOSIS — I255 Ischemic cardiomyopathy: Secondary | ICD-10-CM

## 2019-08-23 DIAGNOSIS — E785 Hyperlipidemia, unspecified: Secondary | ICD-10-CM

## 2019-08-23 DIAGNOSIS — R5383 Other fatigue: Secondary | ICD-10-CM

## 2019-08-23 NOTE — Telephone Encounter (Signed)
We can add Vit D & TSH (for Fatigue) to labs - minimize lab sticks.    Bryan Lemma, MD

## 2019-08-23 NOTE — Telephone Encounter (Signed)
Fasting labs are in the computer, daughter notified to come in Thursday 4-1 to have these drawn. She is asking if pt can have iron and vitamin D drawn also. She states that pt has no energy (BP is fine). Informed her that this is not usually a cardiac order, she states that she would like to know if we can also send this message to Dr Andrey Campanile to see if he will order so she can have them both done. Daughter verbalizes understanding. Will also forward to Dr Andrey Campanile via Epic fax function

## 2019-08-23 NOTE — Telephone Encounter (Signed)
New Message:  She wants to know if pt need lab work before his appt on 08-30-19? If he needs lab work, could you check his Vitamin D and Iron please. Pt have no energy.

## 2019-08-24 NOTE — Telephone Encounter (Signed)
Entered lab orders and Vit D and fatigue is not a covered dx. S/w daughter Gordon Novak G.V. (Gordon Novak) she states that she will call PCP to discus this with them. She will CB if anything else is needed.

## 2019-08-26 ENCOUNTER — Other Ambulatory Visit: Payer: Self-pay

## 2019-08-26 DIAGNOSIS — E785 Hyperlipidemia, unspecified: Secondary | ICD-10-CM

## 2019-08-26 LAB — LIPID PANEL
Chol/HDL Ratio: 2.9 ratio (ref 0.0–5.0)
Cholesterol, Total: 178 mg/dL (ref 100–199)
HDL: 62 mg/dL (ref >39)
LDL Chol Calc (NIH): 93 mg/dL (ref 0–99)
Triglycerides: 131 mg/dL (ref 0–149)
VLDL Cholesterol Cal: 23 mg/dL (ref 5–40)

## 2019-08-26 LAB — COMPREHENSIVE METABOLIC PANEL
ALT: 17 IU/L (ref 0–44)
AST: 21 IU/L (ref 0–40)
Albumin/Globulin Ratio: 2.1 (ref 1.2–2.2)
Albumin: 3.9 g/dL (ref 3.6–4.6)
Alkaline Phosphatase: 105 IU/L (ref 39–117)
BUN/Creatinine Ratio: 12 (ref 10–24)
BUN: 12 mg/dL (ref 8–27)
Bilirubin Total: 0.5 mg/dL (ref 0.0–1.2)
CO2: 25 mmol/L (ref 20–29)
Calcium: 9.3 mg/dL (ref 8.6–10.2)
Chloride: 103 mmol/L (ref 96–106)
Creatinine, Ser: 0.97 mg/dL (ref 0.76–1.27)
GFR calc Af Amer: 84 mL/min/{1.73_m2} (ref >59)
GFR calc non Af Amer: 72 mL/min/{1.73_m2} (ref >59)
Globulin, Total: 1.9 g/dL (ref 1.5–4.5)
Glucose: 147 mg/dL — ABNORMAL HIGH (ref 65–99)
Potassium: 4.9 mmol/L (ref 3.5–5.2)
Sodium: 142 mmol/L (ref 134–144)
Total Protein: 5.8 g/dL — ABNORMAL LOW (ref 6.0–8.5)

## 2019-08-30 ENCOUNTER — Ambulatory Visit (INDEPENDENT_AMBULATORY_CARE_PROVIDER_SITE_OTHER): Payer: Medicare Other | Admitting: Cardiology

## 2019-08-30 ENCOUNTER — Encounter: Payer: Self-pay | Admitting: Cardiology

## 2019-08-30 ENCOUNTER — Other Ambulatory Visit: Payer: Self-pay

## 2019-08-30 VITALS — BP 116/72 | HR 66 | Ht 72.0 in | Wt 183.0 lb

## 2019-08-30 DIAGNOSIS — E785 Hyperlipidemia, unspecified: Secondary | ICD-10-CM | POA: Diagnosis not present

## 2019-08-30 DIAGNOSIS — I255 Ischemic cardiomyopathy: Secondary | ICD-10-CM

## 2019-08-30 DIAGNOSIS — Z955 Presence of coronary angioplasty implant and graft: Secondary | ICD-10-CM

## 2019-08-30 DIAGNOSIS — I25119 Atherosclerotic heart disease of native coronary artery with unspecified angina pectoris: Secondary | ICD-10-CM | POA: Diagnosis not present

## 2019-08-30 DIAGNOSIS — I5042 Chronic combined systolic (congestive) and diastolic (congestive) heart failure: Secondary | ICD-10-CM

## 2019-08-30 DIAGNOSIS — I2119 ST elevation (STEMI) myocardial infarction involving other coronary artery of inferior wall: Secondary | ICD-10-CM

## 2019-08-30 DIAGNOSIS — I952 Hypotension due to drugs: Secondary | ICD-10-CM

## 2019-08-30 DIAGNOSIS — Z86711 Personal history of pulmonary embolism: Secondary | ICD-10-CM

## 2019-08-30 MED ORDER — ROSUVASTATIN CALCIUM 10 MG PO TABS: 10.0000 mg | ORAL_TABLET | Freq: Every day | ORAL | 3 refills | Status: AC

## 2019-08-30 NOTE — Patient Instructions (Signed)
Medication Instructions:   Stop taking Lisinopril    after May 31,2021 stop taking Xarelto   Start taking Rosuvastatin 10 mg one tablet daily    *If you need a refill on your cardiac medications before your next appointment, please call your pharmacy*   Lab Work: Not needed  Testing/Procedures: Not needed   Follow-Up: At [facility], you and your health needs are our priority.  As part of our continuing mission to provide you with exceptional heart care, we have created designated Provider Care Teams.  These Care Teams include your primary Cardiologist (physician) and Advanced Practice Providers (APPs -  Physician Assistants and Nurse Practitioners) who all work together to provide you with the care you need, when you need it.    Your next appointment:   6 to 7  month(s)  The format for your next appointment:   In Person  Provider:   Bryan Lemma, MD   Other Instructions N/A

## 2019-08-30 NOTE — Progress Notes (Signed)
Primary Care Provider: [name], MD Cardiologist: Bryan Lemma, MD Electrophysiologist: None  Clinic Note: Chief Complaint  Patient presents with  . Follow-up    6 months  . Coronary Artery Disease    No further angina, just fatigue    HPI:    Gordon Novak is a 85 y.o. male with a PMH notable for inferolateral STEMI-CAD (PCI RCA May 2020) and subsegmental PE who presents today for 72-month follow-up.   May 24-26 2020: Acute inferior-inferolateral STEMI (RCA -> DES PCI to RCA.  Oct 22, 2018: Subsegmental pulmonary emboli; curbside by cardiology suggested converting from Brilinta to Plavix and start Xarelto.  Gordon Novak was last seen on March 03, 2019 to discuss results of his echocardiogram  Concerned over mild fatigue and memory issues.  Plan was to reduce and then potentially hold statin.  Also mentioned if systolic pressures less than 100 mmHg, hold lisinopril.  Recent Hospitalizations: None  Reviewed  CV studies:    The following studies were reviewed today: (if available, images/films reviewed: From Epic Chart or Care Everywhere) . None:   Interval History:   Gordon Novak returns here today with presumably his daughter still he residual fairly well from a cardiac standpoint.  He is just not as energetic as he used to be.  He used to go mild wall etc. but does not do that since his MI.  Sees tired quite a bit but has not had any of his anginal symptoms with rest or exertion no resting exertional dyspnea.  Just tired.  He does like to go outside and do gardening and other chores, but just not able to do as long as he had before.  He has a little bit positional dizziness but not significant.  He said that the memory issues are still present.  There is a little bit improvement when he initially stopped the statin, but not really now.  CV Review of Symptoms (Summary): no chest pain or dyspnea on exertion positive for - Fatigue, occasional  lightheadedness.  Exercise intolerance negative for - edema, irregular heartbeat, orthopnea, palpitations, paroxysmal nocturnal dyspnea, rapid heart rate, shortness of breath or Syncope or near syncope, TIA/amaurosis fugax,  Claudication  The patient does not have symptoms concerning for COVID-19 infection (fever, chills, cough, or new shortness of breath).  The patient is practicing social distancing & Masking.   He has completed both COVID-19 vaccine injections (second dose on February 15)  REVIEWED OF SYSTEMS   Review of Systems  Constitutional: Positive for malaise/fatigue (More tired than he used to be.  Exercise intolerance.). Negative for weight loss.  HENT: Negative for congestion and nosebleeds.   Respiratory: Negative for shortness of breath.   Gastrointestinal: Negative for abdominal pain, blood in stool and melena.  Genitourinary: Negative for hematuria.  Musculoskeletal: Positive for joint pain. Negative for falls.  Neurological: Positive for dizziness (Sometimes with position, orthostatic) and weakness (Generalized). Negative for headaches.  Psychiatric/Behavioral: Positive for memory loss. Negative for depression. The patient is not nervous/anxious and does not have insomnia.    I have reviewed and (if needed) personally updated the patient's problem list, medications, allergies, past medical and surgical history, social and family history.   PAST MEDICAL HISTORY   Past Medical History:  Diagnosis Date  . Acute systolic heart failure (HCC)    35-40%  . Bladder outlet obstruction   . Blindness of left eye   . CAD S/P percutaneous coronary angioplasty 10/22/2018   Cath-PCA 10/18/2018:  100% mRCA -- (DES PCI-Synergy 3 mm x 28 mm - 3.3 mm).  Otherwise normal coronaries  . Chronic Left leg swelling    a. Since 2007.  Marland Kitchen Essential hypertension   . Gout   . Hemorrhoids 05/12/2014  . Ischemic cardiomyopathy--EF now resolved to 50 to 55%. 10/30/2018   Post inferior STEMI: TTE  10/18/2018: EF 35-40%, diffuse hypokinesis.  GR 1 DD --> resolved 4 months post MI. -Residual inferior and inferolateral and inferoseptal hypokinesis.  . Nephrolithiasis   . PE (pulmonary thromboembolism) (HCC) 10/22/2018  . ST elevation myocardial infarction (STEMI) of inferior wall (HCC) 10/18/2018    PCI/DES x1 to the mRCA  . Type 2 diabetes mellitus (HCC)     PAST SURGICAL HISTORY   Past Surgical History:  Procedure Laterality Date  . CATARACT EXTRACTION    . CORONARY/GRAFT ACUTE MI REVASCULARIZATION N/A 10/18/2018   Procedure: Coronary/Graft Acute MI Revascularization - Cornonary Stent Intervention;  Surgeon: Runell Gess, MD;  Location: [facility];;  100% mRCA -- (DES PCI-Synergy 3 mm x 28 mm - 3.3 mm).    . CYSTOSCOPY W/ URETERAL STENT PLACEMENT Left 05/07/2014   Procedure: CYSTOSCOPY WITH RETROGRADE PYELOGRAM/ LEFT URETERAL STENT PLACEMENT;  Surgeon: Crist Fat, MD;  Location: [facility];  Service: Urology;  Laterality: Left;  . CYSTOSCOPY WITH RETROGRADE PYELOGRAM, URETEROSCOPY AND STENT PLACEMENT Left 05/14/2014   Procedure: LEFT URETEROSCOPY AND STENT EXCHANGE,STONE BASKETRY EXTRACTION;  Surgeon: Jerilee Field, MD;  Location: [facility];  Service: Urology;  Laterality: Left;  . HOLMIUM LASER APPLICATION Left 05/14/2014   Procedure: HOLMIUM LASER APPLICATION;  Surgeon: Jerilee Field, MD;  Location: [facility];  Service: Urology;  Laterality: Left;  . INTRAOCULAR LENS INSERTION Right   . LEFT HEART CATH AND CORONARY ANGIOGRAPHY N/A 10/18/2018   Procedure: LEFT HEART CATH AND CORONARY ANGIOGRAPHY;  Surgeon: Runell Gess, MD;  Location: [facility];;  100% mRCA -- (DES PCI)..  Otherwise normal coronaries  . TRANSTHORACIC ECHOCARDIOGRAM  02/17/2019   EF 50 to 55%.  Residual basal-mid inferolateral and apical lateral as well as basal septal hypokinesis.  Normal RV size and function.  Relatively normal atrial size.  Moderate aortic sclerosis/calcification but no  stenosis.  Bowed interatrial septum suggesting elevated RAP.  Mildly elevated PAP.--Significant improvement -> prior EF (initially 35 to 40%) in setting of Inf-lat STEMI May 2020   10/18/2018 post PCI imaging:    MEDICATIONS/ALLERGIES   Current Meds  Medication Sig  . docusate sodium (COLACE) 100 MG capsule Take 100 mg by mouth 2 (two) times daily. Take daily at bedtime  . tamsulosin (FLOMAX) 0.4 MG CAPS capsule Take 0.4 mg by mouth daily.    Allergies  Allergen Reactions  . Lipitor [Atorvastatin Calcium] Other (See Comments)    Patient becomes agitated, has memory issues.    SOCIAL HISTORY/FAMILY HISTORY   Reviewed in Epic:  Pertinent findings: No real change  OBJCTIVE -PE, EKG, labs   Wt Readings from Last 3 Encounters:  08/30/19 183 lb (83 kg)  03/03/19 179 lb (81.2 kg)  12/02/18 179 lb 8 oz (81.4 kg)    Physical Exam: BP 116/72   Pulse 66   Ht 6' (1.829 m)   Wt 183 lb (83 kg)   SpO2 95%   BMI 24.82 kg/m  Physical Exam  Constitutional: He is oriented to person, place, and time. He appears well-developed and well-nourished. No distress.  Otherwise healthy-appearing elderly gentleman.  Well-groomed appropriately wearing facemask.  HENT:  Head: Normocephalic and atraumatic.  Eyes:  L eye blind with reduced extraocular motor function   Neck: No hepatojugular reflux and no JVD present. Carotid bruit is not present.  Cardiovascular: Normal rate, regular rhythm, normal heart sounds and intact distal pulses.  No extrasystoles are present. PMI is not displaced. Exam reveals no gallop and no friction rub.  No murmur heard. Pulmonary/Chest: Breath sounds normal. No respiratory distress. He has no wheezes. He has no rales.  Abdominal: Soft. Bowel sounds are normal. He exhibits no distension. There is no abdominal tenderness.  Musculoskeletal:        General: Edema (Trivial) present. Normal range of motion.     Cervical back: Normal range of motion and neck supple.    Neurological: He is alert and oriented to person, place, and time.  Psychiatric: He has a normal mood and affect. Judgment and thought content normal.  A little slow to answer questions.  Vitals reviewed.    Adult ECG Report  Rate: 66 ;  Rhythm: normal sinus rhythm and Normal axis, intervals and durations.  Narrative Interpretation: Normal/stable EKG.  Recent Labs:    Lab Results  Component Value Date   CHOL 178 08/26/2019   HDL 62 08/26/2019   LDLCALC 93 08/26/2019   TRIG 131 08/26/2019   CHOLHDL 2.9 08/26/2019   Lab Results  Component Value Date   CREATININE 0.97 08/26/2019   BUN 12 08/26/2019   NA 142 08/26/2019   K 4.9 08/26/2019   CL 103 08/26/2019   CO2 25 08/26/2019   No results found for: TSH  ASSESSMENT/PLAN    Problem List Items Addressed This Visit    Chronic combined systolic and diastolic heart failure (HCC) (Chronic)    Significant improvement in EF from MI on most recent echo.  Euvolemic on exam with minimal edema.  There is chronic and predates his MI.  Usually well controlled with standing dose of Lasix very infrequently uses as needed dosing.   Plan: Continue low-dose Toprol, but because of fatigue and dizziness, we will simply stop lisinopril altogether. Continue low-dose Lasix.      Relevant Medications   rosuvastatin (CRESTOR) 10 MG tablet   Personal history of PE (pulmonary embolism) (Chronic)    His PE was shortly after the MRI.  Likely related to his sedentary state following the MI.  Follow-up echo shows stable RV function with mild RA dilation.  No signs of cor pulmonale.  Has been on Xarelto now almost for a year which was a target dosing interval..  We will have him stop after May 31 which would put him a year out.  He was in continue clopidogrel going forward.  After the second year, we can determine whether we go back to [place] versus continue on Plavix.      Coronary artery disease involving native coronary artery of native heart with  angina pectoris ([place]) - Primary (Chronic)    Pretty decent size DES stent to the RCA.  Improved EF but existing wall motion hypokinesis from infarct. Not really having any anginal symptoms.  He is not all that active and has not really been doing much of any exercise.  Needs to get back out and do more.  Hopefully as weather changes.  He will be a little better.  Some mostly related to hypotension.  Plan:  Continue low-dose Toprol as he has not had any further anginal symptoms.  Stopping lisinopril to allow for more blood pressure and less dizziness.  Hopefully this  will help his fatigue.  We had stopped his statin because of memory issues.  No real improvement. ->  Convert to low-dose rosuvastatin.  We will continue Plavix out for least another year, and stop Xarelto at 1 year from MI/PE      Relevant Medications   rosuvastatin (CRESTOR) 10 MG tablet   Other Relevant Orders   EKG 12-Lead (Completed)   Ischemic cardiomyopathy (Chronic)    EF back up to borderline normal now.  No active heart failure symptoms. Not able to tolerate combination of low-dose ACE inhibitor and beta-blocker.  D/C ACE inhibitor..  Continue low-dose beta-blocker along with furosemide with sliding scale.      Relevant Medications   rosuvastatin (CRESTOR) 10 MG tablet   Other Relevant Orders   EKG 12-Lead (Completed)   Presence of drug-eluting stent in right coronary artery (Chronic)    For now the plan will be to continue clopidogrel.  As of May 31, this can be held for procedures.  We can stop Xarelto at that point.   As of October 26, 2019, okay to hold clopidogrel for procedures 5-7 days preop      Hyperlipidemia with target LDL less than 70 (Chronic)    After having amazing drop on atorvastatin, his LDL has gone back up now since being off.  I do not know that his memory issues really improved that much. Plan will be to convert to low-dose rosuvastatin and reassess labs in roughly 6 months during  follow-up.      Relevant Medications   rosuvastatin (CRESTOR) 10 MG tablet   Other Relevant Orders   EKG 12-Lead (Completed)   Acute ST elevation myocardial infarction (STEMI) of inferior wall (HCC) (Chronic)    Almost a year out from inferior STEMI with RCA PCI.  He had pretty significant collateral flow to the RCA and has residual wall motion abnormality consistent with prior MI.  Unfortunately he had a pulmonary embolus shortly after his admission. Has had issues with tolerating blood pressure medications including ACE inhibitor and beta-blocker. Major symptom now since the MI is fatigue      Relevant Medications   rosuvastatin (CRESTOR) 10 MG tablet   Hypotension due to drugs    We have reduced his dose of lisinopril, but he still has lower usual blood pressure readings and has had dizziness, fatigue.  Thankfully no falls.  Plan: DC lisinopril.  We will try to continue with low-dose Toprol, but may need to stop that as well.      Relevant Medications   rosuvastatin (CRESTOR) 10 MG tablet       COVID-19 Education: The signs and symptoms of COVID-19 were discussed with the patient and how to seek care for testing (follow up with PCP or arrange E-visit).   The importance of social distancing was discussed today.  I spent a total of 21minutes with the patient. >  50% of the time was spent in direct patient consultation.  Additional time spent with chart review  / charting (studies, outside notes, etc): 10 Total Time: 31 min   Current medicines are reviewed at length with the patient today.  (+/- concerns) none  Notice: This dictation was prepared with Dragon dictation along with smaller phrase technology. Any transcriptional errors that result from this process are unintentional and may not be corrected upon review.  Patient Instructions / Medication Changes & Studies & Tests Ordered   Patient Instructions  Medication Instructions:   Stop taking Lisinopril    after  May  31,2021 stop taking Xarelto   Start taking Rosuvastatin 10 mg one tablet daily    *If you need a refill on your cardiac medications before your next appointment, please call your pharmacy*   Lab Work: Not needed  Testing/Procedures: Not needed   Follow-Up: At [facility], you and your health needs are our priority.  As part of our continuing mission to provide you with exceptional heart care, we have created designated Provider Care Teams.  These Care Teams include your primary Cardiologist (physician) and Advanced Practice Providers (APPs -  Physician Assistants and Nurse Practitioners) who all work together to provide you with the care you need, when you need it.    Your next appointment:   6 to 7  month(s)  The format for your next appointment:   In Person  Provider:   Bryan Lemma, MD   Other Instructions N/A    Studies Ordered:   Orders Placed This Encounter  Procedures  . EKG 12-Lead     Bryan Lemma, M.D., M.S. Interventional Cardiologist   Pager # [phone] Phone # [phone] [address] [place], [place] [address]   Thank you for choosing Heartcare at [facility]!!

## 2019-09-01 ENCOUNTER — Other Ambulatory Visit: Payer: Self-pay | Admitting: Cardiovascular Disease

## 2019-09-01 ENCOUNTER — Encounter: Payer: Self-pay | Admitting: Cardiology

## 2019-09-01 NOTE — Assessment & Plan Note (Signed)
We have reduced his dose of lisinopril, but he still has lower usual blood pressure readings and has had dizziness, fatigue.  Thankfully no falls.  Plan: DC lisinopril.  We will try to continue with low-dose Toprol, but may need to stop that as well.

## 2019-09-01 NOTE — Assessment & Plan Note (Signed)
EF back up to borderline normal now.  No active heart failure symptoms. Not able to tolerate combination of low-dose ACE inhibitor and beta-blocker.  D/C ACE inhibitor..  Continue low-dose beta-blocker along with furosemide with sliding scale.

## 2019-09-01 NOTE — Assessment & Plan Note (Addendum)
Significant improvement in EF from MI on most recent echo.  Euvolemic on exam with minimal edema.  There is chronic and predates his MI.  Usually well controlled with standing dose of Lasix very infrequently uses as needed dosing.   Plan: Continue low-dose Toprol, but because of fatigue and dizziness, we will simply stop lisinopril altogether. Continue low-dose Lasix.

## 2019-09-01 NOTE — Assessment & Plan Note (Addendum)
Pretty decent size DES stent to the RCA.  Improved EF but existing wall motion hypokinesis from infarct. Not really having any anginal symptoms.  He is not all that active and has not really been doing much of any exercise.  Needs to get back out and do more.  Hopefully as weather changes.  He will be a little better.  Some mostly related to hypotension.  Plan:  Continue low-dose Toprol as he has not had any further anginal symptoms.  Stopping lisinopril to allow for more blood pressure and less dizziness.  Hopefully this will help his fatigue.  We had stopped his statin because of memory issues.  No real improvement. ->  Convert to low-dose rosuvastatin.  We will continue Plavix out for least another year, and stop Xarelto at 1 year from MI/PE

## 2019-09-01 NOTE — Assessment & Plan Note (Signed)
His PE was shortly after the MRI.  Likely related to his sedentary state following the MI.  Follow-up echo shows stable RV function with mild RA dilation.  No signs of cor pulmonale.  Has been on Xarelto now almost for a year which was a target dosing interval..  We will have him stop after May 31 which would put him a year out.  He was in continue clopidogrel going forward.  After the second year, we can determine whether we go back to DOAC versus continue on Plavix.

## 2019-09-01 NOTE — Assessment & Plan Note (Signed)
For now the plan will be to continue clopidogrel.  As of May 31, this can be held for procedures.  We can stop Xarelto at that point.   As of October 26, 2019, okay to hold clopidogrel for procedures 5-7 days preop

## 2019-09-01 NOTE — Assessment & Plan Note (Signed)
After having amazing drop on atorvastatin, his LDL has gone back up now since being off.  I do not know that his memory issues really improved that much. Plan will be to convert to low-dose rosuvastatin and reassess labs in roughly 6 months during follow-up.

## 2019-09-01 NOTE — Assessment & Plan Note (Signed)
Almost a year out from inferior STEMI with RCA PCI.  He had pretty significant collateral flow to the RCA and has residual wall motion abnormality consistent with prior MI.  Unfortunately he had a pulmonary embolus shortly after his admission. Has had issues with tolerating blood pressure medications including ACE inhibitor and beta-blocker. Major symptom now since the MI is fatigue

## 2019-11-07 ENCOUNTER — Other Ambulatory Visit: Payer: Self-pay | Admitting: Cardiology

## 2020-02-09 ENCOUNTER — Emergency Department (HOSPITAL_COMMUNITY)
Admission: EM | Admit: 2020-02-09 | Discharge: 2020-02-09 | Disposition: A | Discharge status: Home / Self Care | Payer: Medicare Other | Attending: Emergency Medicine | Admitting: Emergency Medicine

## 2020-02-09 ENCOUNTER — Other Ambulatory Visit: Payer: Self-pay

## 2020-02-09 ENCOUNTER — Emergency Department (HOSPITAL_COMMUNITY): Payer: Medicare Other

## 2020-02-09 DIAGNOSIS — Y92002 Bathroom of unspecified non-institutional (private) residence single-family (private) house as the place of occurrence of the external cause: Secondary | ICD-10-CM | POA: Diagnosis not present

## 2020-02-09 DIAGNOSIS — W19XXXA Unspecified fall, initial encounter: Secondary | ICD-10-CM | POA: Insufficient documentation

## 2020-02-09 DIAGNOSIS — Y999 Unspecified external cause status: Secondary | ICD-10-CM | POA: Diagnosis not present

## 2020-02-09 DIAGNOSIS — S59901A Unspecified injury of right elbow, initial encounter: Secondary | ICD-10-CM | POA: Diagnosis present

## 2020-02-09 DIAGNOSIS — S50311A Abrasion of right elbow, initial encounter: Secondary | ICD-10-CM | POA: Diagnosis not present

## 2020-02-09 DIAGNOSIS — S0990XA Unspecified injury of head, initial encounter: Secondary | ICD-10-CM | POA: Insufficient documentation

## 2020-02-09 DIAGNOSIS — Y9301 Activity, walking, marching and hiking: Secondary | ICD-10-CM | POA: Diagnosis not present

## 2020-02-09 MED ORDER — ACETAMINOPHEN 325 MG PO TABS
650.0000 mg | ORAL_TABLET | Freq: Once | ORAL | Status: AC
  Administered 2020-02-09: 650 mg via ORAL
  Filled 2020-02-09: qty 2

## 2020-02-09 NOTE — ED Provider Notes (Signed)
[facility] EMERGENCY DEPARTMENT Provider Note   CSN: [id number] Arrival date & time: 02/09/20  0024   History Chief Complaint  Patient presents with  . Fall  . LEVEL 2    KOWEN KLUTH is a 85 y.o. male.  The history is provided by the patient and the EMS personnel.  Fall  He has history of hypertension, ischemic cardiomyopathy, pulmonary embolism anticoagulated on rivaroxaban and comes in as a level 2 trauma following a fall at home.  He suffered some bruising of his right elbow and thinks he may have hit his head.  He denies loss of consciousness.  Fall occurred as he was walking to the bathroom.  He does not recall if he tripped or just lost his balance.  He states he was unable to get up once he fell.  He denies hurting anywhere besides his right elbow to my questioning, but had told triage nurse that he was having some pain in his neck.  No past medical history on file.  There are no problems to display for this patient.   ** The histories are not reviewed yet. Please review them in the "History" navigator section and refresh this SmartLink.     No family history on file.  Social History   Tobacco Use  . Smoking status: Not on file  Substance Use Topics  . Alcohol use: Not on file  . Drug use: Not on file    Home Medications Prior to Admission medications   Not on File    Allergies    Patient has no allergy information on record.  Review of Systems   Review of Systems  All other systems reviewed and are negative.   Physical Exam Updated Vital Signs BP (!) 147/61   Pulse 98   Temp 98.6 F (37 C) (Oral)   Resp (!) 23   Ht 6\' 1"  (1.854 m)   Wt 90.7 kg   SpO2 97%   BMI 26.39 kg/m   Physical Exam Vitals and nursing note reviewed.   85 year old male, resting comfortably and in no acute distress. Vital signs are significant for mildly elevated blood pressure and slightly elevated respiratory rate. Oxygen saturation is 97%, which  is normal. Head is normocephalic and atraumatic. PERRLA, EOMI. Oropharynx is clear. Neck is immobilized in a stiff cervical collar and is  supple without adenopathy or JVD. Back is nontender and there is no CVA tenderness. Lungs are clear without rales, wheezes, or rhonchi. Chest is nontender. Heart has regular rate and rhythm without murmur. Abdomen is soft, flat, nontender without masses or hepatosplenomegaly and peristalsis is normoactive. Pelvis is stable and nontender. Extremities have no cyanosis or edema, full range of motion is present.  Minor abrasion noted right elbow.  No elbow swelling.  There is 1+ edema of the left leg which is slightly swollen compared with the right.  This is chronic, per the patient. Skin is warm and dry without rash. Neurologic: Mental status is normal, cranial nerves are intact, there are no motor or sensory deficits.  He moves all extremities equally.  ED Results / Procedures / Treatments   Labs (all labs ordered are listed, but only abnormal results are displayed) Labs Reviewed - No data to display  EKG EKG Interpretation  Date/Time:  Wednesday February 09 2020 00:26:44 EDT Ventricular Rate:  92 PR Interval:    QRS Duration: 72 QT Interval:  324 QTC Calculation: 401 R Axis:   72 Text Interpretation:  Sinus rhythm Abnormal R-wave progression, early transition No old tracing to compare Confirmed by Dione Booze ([id number]) on 02/09/2020 12:50:06 AM   Radiology CT Head Wo Contrast  Result Date: 02/09/2020 CLINICAL DATA:  Trauma, fall on blood thinners EXAM: CT HEAD WITHOUT CONTRAST CT CERVICAL SPINE WITHOUT CONTRAST TECHNIQUE: Multidetector CT imaging of the head and cervical spine was performed following the standard protocol without intravenous contrast. Multiplanar CT image reconstructions of the cervical spine were also generated. COMPARISON:  None. FINDINGS: CT HEAD FINDINGS Brain: No acute territorial infarction, hemorrhage, or intracranial mass.  Moderate atrophy. Mild hypodensity in the white matter consistent with chronic small vessel ischemic change. The ventricles are nonenlarged. Vascular: No hyperdense vessels.  Carotid vascular calcification. Skull: No depressed skull fracture. Sinuses/Orbits: Mucosal thickening in the maxillary and ethmoid sinuses. Small fluid level in the left sphenoid sinus but no central skull base lucency is seen. Possible chronic left nasal bone fracture. Other: Mild right posterior scalp swelling. CT CERVICAL SPINE FINDINGS Alignment: No subluxation.  Facet alignment within normal limits. Skull base and vertebrae: No acute fracture. No primary bone lesion or focal pathologic process. Soft tissues and spinal canal: No prevertebral fluid or swelling. No visible canal hematoma. Disc levels: Multiple level degenerative change with moderate severe disease at C5-C6 and C6-C7. Facet degenerative changes at multiple levels with foraminal narrowing. Upper chest: Apical scarring Other: None IMPRESSION: 1. No CT evidence for acute intracranial abnormality. Atrophy and mild chronic small vessel ischemic changes of the white matter. 2. Degenerative changes of the cervical spine. No acute osseous abnormality. Electronically Signed   By: Jasmine Pang M.D.   On: 02/09/2020 01:13   CT Cervical Spine Wo Contrast  Result Date: 02/09/2020 CLINICAL DATA:  Trauma, fall on blood thinners EXAM: CT HEAD WITHOUT CONTRAST CT CERVICAL SPINE WITHOUT CONTRAST TECHNIQUE: Multidetector CT imaging of the head and cervical spine was performed following the standard protocol without intravenous contrast. Multiplanar CT image reconstructions of the cervical spine were also generated. COMPARISON:  None. FINDINGS: CT HEAD FINDINGS Brain: No acute territorial infarction, hemorrhage, or intracranial mass. Moderate atrophy. Mild hypodensity in the white matter consistent with chronic small vessel ischemic change. The ventricles are nonenlarged. Vascular: No  hyperdense vessels.  Carotid vascular calcification. Skull: No depressed skull fracture. Sinuses/Orbits: Mucosal thickening in the maxillary and ethmoid sinuses. Small fluid level in the left sphenoid sinus but no central skull base lucency is seen. Possible chronic left nasal bone fracture. Other: Mild right posterior scalp swelling. CT CERVICAL SPINE FINDINGS Alignment: No subluxation.  Facet alignment within normal limits. Skull base and vertebrae: No acute fracture. No primary bone lesion or focal pathologic process. Soft tissues and spinal canal: No prevertebral fluid or swelling. No visible canal hematoma. Disc levels: Multiple level degenerative change with moderate severe disease at C5-C6 and C6-C7. Facet degenerative changes at multiple levels with foraminal narrowing. Upper chest: Apical scarring Other: None IMPRESSION: 1. No CT evidence for acute intracranial abnormality. Atrophy and mild chronic small vessel ischemic changes of the white matter. 2. Degenerative changes of the cervical spine. No acute osseous abnormality. Electronically Signed   By: Jasmine Pang M.D.   On: 02/09/2020 01:13    Procedures Procedures  CRITICAL CARE Performed by: Dione Booze Total critical care time: 35 minutes Critical care time was exclusive of separately billable procedures and treating other patients. Critical care was necessary to treat or prevent imminent or life-threatening deterioration. Critical care was time spent personally by me on the following activities:  development of treatment plan with patient and/or surrogate as well as nursing, discussions with consultants, evaluation of patient's response to treatment, examination of patient, obtaining history from patient or surrogate, ordering and performing treatments and interventions, ordering and review of laboratory studies, ordering and review of radiographic studies, pulse oximetry and re-evaluation of patient's condition.  Medications Ordered in  ED Medications - No data to display  ED Course  I have reviewed the triage vital signs and the nursing notes.  Pertinent labs & imaging results that were available during my care of the patient were reviewed by me and considered in my medical decision making (see chart for details).  MDM Rules/Calculators/A&P Fall and patient who is chronically anticoagulated.  Minor skin tears of the right elbow.  Possible minor head injury and neck injury.  He will be sent for CT scans.  No indication for any other imaging or lab work.  Old records are reviewed confirming history of coronary artery disease with ischemic cardiomyopathy and history of pulmonary embolism.  CT scan showed no acute injury to head or cervical spine.  Patient was ambulated in the emergency department and was able to do so without difficulty, although he was noted to be somewhat unsteady on his feet.  He is discharged with instructions to use his walker when ambulating at home.  Also, of note, he reportedly tested positive for COVID-19 on September 10.  Final Clinical Impression(s) / ED Diagnoses Final diagnoses:  Fall at home, initial encounter  Abrasion of right elbow, initial encounter    Rx / DC Orders ED Discharge Orders    None       Dione Booze, MD 02/09/20 [phone]

## 2020-02-09 NOTE — ED Notes (Addendum)
Updated family. Informed pt will be able to go home if able to ambulate. Daughter concerned of transportation home for pt. As he lives with wife and wife is unable to drive. RN informed daughter he could go home via cab/ambulance. Daughter unable to pick up pt d/t exposure since he tested +COVID last Friday at [facility].

## 2020-02-09 NOTE — ED Notes (Signed)
Pt ambulated in the hall gait was unstable states he uses a walker to get around at home.

## 2020-02-09 NOTE — ED Notes (Signed)
Pt discharged via [facility] at this time

## 2020-02-09 NOTE — ED Triage Notes (Signed)
Pt presents to ED BIB [facility]. Pt presents as LEVEL 2 trauma. Pt had mechanical fall this evening. Per pt he hit head, no trauma to head. Per EMS pt AAO at baseline. Injury to elbow.+ blood thinners

## 2020-02-09 NOTE — ED Notes (Signed)
T 101.2 per Dr. Preston Fleeting likely d/t COVID+ RN to give 650 tylenol prior to discharge

## 2020-02-09 NOTE — Progress Notes (Signed)
Orthopedic Tech Progress Note Patient Details:  Gordon Novak 1934-12-06 [id number] Level 2 Trauma Patient ID: Gordon Novak, male   DOB: 1935/04/24, 85 y.o.   MRN: [id number]   Gerald Stabs 02/09/2020, 6:45 AM

## 2020-02-09 NOTE — Discharge Instructions (Signed)
Make sure you are using your walker when waking in the house.

## 2020-02-09 NOTE — Progress Notes (Signed)
   02/09/20 0000  Clinical Encounter Type  Visited With Patient not available  Visit Type ED  Referral From Nurse  Consult/Referral To Chaplain  Chaplain responded to page. No family present. No spiritual care needs at this time. Chaplain will follow up as needed.

## 2020-02-09 NOTE — ED Notes (Signed)
Pt transported to CT ?

## 2020-02-10 ENCOUNTER — Telehealth: Payer: Self-pay | Admitting: Cardiology

## 2020-02-10 NOTE — Telephone Encounter (Signed)
EKG looks okay.  Nonspecific changes.  Nothing more to worry about,  Bryan Lemma, MD\

## 2020-02-10 NOTE — Telephone Encounter (Signed)
Daughter of the patient called back. Call was transferred to [facility]

## 2020-02-10 NOTE — Telephone Encounter (Signed)
Returned call to number provided. Pt state daughter has already left but will have her call back.

## 2020-02-10 NOTE — Telephone Encounter (Signed)
Gordon Novak, Daughter of the patient called. The patient fell overnight Tuesday and was sent to [facility]. Based on the visit notes from the hospital, the patient had an Abnormal r- wave progression on his EKG. He also has a significant amount of bruising and is on Plavix. The Daughter of the patient wanted to know if there is anything she should be concerned about in regards to the abnormality in his EKG. Please call and let her know

## 2020-02-10 NOTE — Telephone Encounter (Signed)
Spoke with pt's daughter who report pt had a fall yesterday and was evaluated by ER. Daughter state she read on mychart that EKG was abnormal and wanted Dr. Herbie Baltimore to review.   Message routed to MD

## 2020-02-11 NOTE — Telephone Encounter (Signed)
Daughter updated and verbalized understanding. She also wanted to make MD aware that pt had to take 2 nitro last night but feeling better this morning. She report they are unsure if CP was related to him currently having COVID.  Will route to MD to make aware.

## 2020-02-12 NOTE — Telephone Encounter (Signed)
It is hard to tell what the Covid or anything else.  Provided he does not have any more symptoms, I probably would not worry about it.  If he has more episodes, then we probably want to consider some type of ischemic evaluation.  Bryan Lemma, MD

## 2020-02-13 ENCOUNTER — Other Ambulatory Visit: Payer: Self-pay | Admitting: Cardiology

## 2020-02-14 NOTE — Telephone Encounter (Signed)
Daughter updated and verbalized understanding.  

## 2020-02-15 ENCOUNTER — Emergency Department (HOSPITAL_COMMUNITY): Payer: Medicare Other

## 2020-02-15 ENCOUNTER — Other Ambulatory Visit: Payer: Self-pay

## 2020-02-15 ENCOUNTER — Inpatient Hospital Stay (HOSPITAL_COMMUNITY)
Admission: EM | Admit: 2020-02-15 | Discharge: 2020-03-08 | DRG: 177 | Disposition: A | Discharge status: Home Health | Payer: Medicare Other | Attending: Internal Medicine | Admitting: Internal Medicine

## 2020-02-15 ENCOUNTER — Telehealth: Payer: Self-pay | Admitting: Cardiology

## 2020-02-15 ENCOUNTER — Inpatient Hospital Stay (HOSPITAL_COMMUNITY): Payer: Medicare Other

## 2020-02-15 ENCOUNTER — Ambulatory Visit (HOSPITAL_COMMUNITY): Admit: 2020-02-15 | Payer: Medicare Other | Admitting: Cardiovascular Disease

## 2020-02-15 ENCOUNTER — Encounter (HOSPITAL_COMMUNITY)
Admission: EM | Disposition: A | Discharge status: Home Health | Payer: Self-pay | Source: Home / Self Care | Attending: Internal Medicine

## 2020-02-15 DIAGNOSIS — I10 Essential (primary) hypertension: Secondary | ICD-10-CM | POA: Diagnosis not present

## 2020-02-15 DIAGNOSIS — I5032 Chronic diastolic (congestive) heart failure: Secondary | ICD-10-CM | POA: Diagnosis present

## 2020-02-15 DIAGNOSIS — R200 Anesthesia of skin: Secondary | ICD-10-CM | POA: Diagnosis present

## 2020-02-15 DIAGNOSIS — D849 Immunodeficiency, unspecified: Secondary | ICD-10-CM | POA: Diagnosis present

## 2020-02-15 DIAGNOSIS — K5900 Constipation, unspecified: Secondary | ICD-10-CM | POA: Diagnosis not present

## 2020-02-15 DIAGNOSIS — I255 Ischemic cardiomyopathy: Secondary | ICD-10-CM | POA: Diagnosis present

## 2020-02-15 DIAGNOSIS — J9601 Acute respiratory failure with hypoxia: Secondary | ICD-10-CM | POA: Diagnosis present

## 2020-02-15 DIAGNOSIS — R778 Other specified abnormalities of plasma proteins: Secondary | ICD-10-CM | POA: Diagnosis present

## 2020-02-15 DIAGNOSIS — R54 Age-related physical debility: Secondary | ICD-10-CM | POA: Diagnosis present

## 2020-02-15 DIAGNOSIS — K8020 Calculus of gallbladder without cholecystitis without obstruction: Secondary | ICD-10-CM | POA: Diagnosis present

## 2020-02-15 DIAGNOSIS — I110 Hypertensive heart disease with heart failure: Secondary | ICD-10-CM | POA: Diagnosis present

## 2020-02-15 DIAGNOSIS — R0602 Shortness of breath: Secondary | ICD-10-CM

## 2020-02-15 DIAGNOSIS — Z8249 Family history of ischemic heart disease and other diseases of the circulatory system: Secondary | ICD-10-CM

## 2020-02-15 DIAGNOSIS — N400 Enlarged prostate without lower urinary tract symptoms: Secondary | ICD-10-CM | POA: Diagnosis present

## 2020-02-15 DIAGNOSIS — J1282 Pneumonia due to coronavirus disease 2019: Secondary | ICD-10-CM | POA: Diagnosis present

## 2020-02-15 DIAGNOSIS — R7989 Other specified abnormal findings of blood chemistry: Secondary | ICD-10-CM | POA: Diagnosis present

## 2020-02-15 DIAGNOSIS — E875 Hyperkalemia: Secondary | ICD-10-CM | POA: Diagnosis not present

## 2020-02-15 DIAGNOSIS — R748 Abnormal levels of other serum enzymes: Secondary | ICD-10-CM | POA: Diagnosis present

## 2020-02-15 DIAGNOSIS — Z79899 Other long term (current) drug therapy: Secondary | ICD-10-CM

## 2020-02-15 DIAGNOSIS — R001 Bradycardia, unspecified: Secondary | ICD-10-CM | POA: Diagnosis present

## 2020-02-15 DIAGNOSIS — I2510 Atherosclerotic heart disease of native coronary artery without angina pectoris: Secondary | ICD-10-CM | POA: Diagnosis present

## 2020-02-15 DIAGNOSIS — Z7984 Long term (current) use of oral hypoglycemic drugs: Secondary | ICD-10-CM

## 2020-02-15 DIAGNOSIS — U071 COVID-19: Secondary | ICD-10-CM | POA: Diagnosis present

## 2020-02-15 DIAGNOSIS — E785 Hyperlipidemia, unspecified: Secondary | ICD-10-CM | POA: Diagnosis present

## 2020-02-15 DIAGNOSIS — E119 Type 2 diabetes mellitus without complications: Secondary | ICD-10-CM

## 2020-02-15 DIAGNOSIS — J982 Interstitial emphysema: Secondary | ICD-10-CM | POA: Diagnosis not present

## 2020-02-15 DIAGNOSIS — Z955 Presence of coronary angioplasty implant and graft: Secondary | ICD-10-CM

## 2020-02-15 DIAGNOSIS — E11649 Type 2 diabetes mellitus with hypoglycemia without coma: Secondary | ICD-10-CM | POA: Diagnosis not present

## 2020-02-15 DIAGNOSIS — Z86711 Personal history of pulmonary embolism: Secondary | ICD-10-CM | POA: Diagnosis not present

## 2020-02-15 DIAGNOSIS — M7989 Other specified soft tissue disorders: Secondary | ICD-10-CM | POA: Diagnosis present

## 2020-02-15 DIAGNOSIS — I259 Chronic ischemic heart disease, unspecified: Secondary | ICD-10-CM

## 2020-02-15 DIAGNOSIS — R7982 Elevated C-reactive protein (CRP): Secondary | ICD-10-CM | POA: Diagnosis present

## 2020-02-15 DIAGNOSIS — I252 Old myocardial infarction: Secondary | ICD-10-CM

## 2020-02-15 DIAGNOSIS — Z66 Do not resuscitate: Secondary | ICD-10-CM | POA: Diagnosis not present

## 2020-02-15 DIAGNOSIS — M109 Gout, unspecified: Secondary | ICD-10-CM | POA: Diagnosis present

## 2020-02-15 DIAGNOSIS — Z87442 Personal history of urinary calculi: Secondary | ICD-10-CM

## 2020-02-15 DIAGNOSIS — I248 Other forms of acute ischemic heart disease: Secondary | ICD-10-CM | POA: Diagnosis not present

## 2020-02-15 DIAGNOSIS — I361 Nonrheumatic tricuspid (valve) insufficiency: Secondary | ICD-10-CM | POA: Diagnosis not present

## 2020-02-15 LAB — FIBRINOGEN: Fibrinogen: 567 mg/dL — ABNORMAL HIGH (ref 210–475)

## 2020-02-15 LAB — CBG MONITORING, ED
Glucose-Capillary: 233 mg/dL — ABNORMAL HIGH (ref 70–99)
Glucose-Capillary: 230 mg/dL — ABNORMAL HIGH (ref 70–99)

## 2020-02-15 LAB — COMPREHENSIVE METABOLIC PANEL
ALT: 216 U/L — ABNORMAL HIGH (ref 0–44)
ALT: 189 U/L — ABNORMAL HIGH (ref 0–44)
AST: 195 U/L — ABNORMAL HIGH (ref 15–41)
AST: 157 U/L — ABNORMAL HIGH (ref 15–41)
Albumin: 2.3 g/dL — ABNORMAL LOW (ref 3.5–5.0)
Albumin: 2.2 g/dL — ABNORMAL LOW (ref 3.5–5.0)
Alkaline Phosphatase: 144 U/L — ABNORMAL HIGH (ref 38–126)
Alkaline Phosphatase: 133 U/L — ABNORMAL HIGH (ref 38–126)
Anion gap: 13 (ref 5–15)
Anion gap: 11 (ref 5–15)
BUN: 22 mg/dL (ref 8–23)
BUN: 22 mg/dL (ref 8–23)
CO2: 21 mmol/L — ABNORMAL LOW (ref 22–32)
CO2: 21 mmol/L — ABNORMAL LOW (ref 22–32)
Calcium: 8.4 mg/dL — ABNORMAL LOW (ref 8.9–10.3)
Calcium: 8.3 mg/dL — ABNORMAL LOW (ref 8.9–10.3)
Chloride: 102 mmol/L (ref 98–111)
Chloride: 105 mmol/L (ref 98–111)
Creatinine, Ser: 0.99 mg/dL (ref 0.61–1.24)
Creatinine, Ser: 0.97 mg/dL (ref 0.61–1.24)
GFR calc Af Amer: >60 mL/min (ref >60)
GFR calc Af Amer: >60 mL/min (ref >60)
GFR calc non Af Amer: >60 mL/min (ref >60)
GFR calc non Af Amer: >60 mL/min (ref >60)
Glucose, Bld: 159 mg/dL — ABNORMAL HIGH (ref 70–99)
Glucose, Bld: 194 mg/dL — ABNORMAL HIGH (ref 70–99)
Potassium: 4.4 mmol/L (ref 3.5–5.1)
Potassium: 4.6 mmol/L (ref 3.5–5.1)
Sodium: 136 mmol/L (ref 135–145)
Sodium: 137 mmol/L (ref 135–145)
Total Bilirubin: 1.1 mg/dL (ref 0.3–1.2)
Total Bilirubin: 1.1 mg/dL (ref 0.3–1.2)
Total Protein: 5.9 g/dL — ABNORMAL LOW (ref 6.5–8.1)
Total Protein: 5.6 g/dL — ABNORMAL LOW (ref 6.5–8.1)

## 2020-02-15 LAB — CBC WITH DIFFERENTIAL/PLATELET
Abs Immature Granulocytes: 0.09 10*3/uL — ABNORMAL HIGH (ref 0.00–0.07)
Abs Immature Granulocytes: 0.11 10*3/uL — ABNORMAL HIGH (ref 0.00–0.07)
Basophils Absolute: 0 10*3/uL (ref 0.0–0.1)
Basophils Absolute: 0 10*3/uL (ref 0.0–0.1)
Basophils Relative: 0 %
Basophils Relative: 0 %
Eosinophils Absolute: 0 10*3/uL (ref 0.0–0.5)
Eosinophils Absolute: 0 10*3/uL (ref 0.0–0.5)
Eosinophils Relative: 0 %
Eosinophils Relative: 0 %
HCT: 47.4 % (ref 39.0–52.0)
HCT: 44.7 % (ref 39.0–52.0)
Hemoglobin: 15.8 g/dL (ref 13.0–17.0)
Hemoglobin: 15.1 g/dL (ref 13.0–17.0)
Immature Granulocytes: 1 %
Immature Granulocytes: 1 %
Lymphocytes Relative: 9 %
Lymphocytes Relative: 5 %
Lymphs Abs: 1.1 10*3/uL (ref 0.7–4.0)
Lymphs Abs: 0.6 10*3/uL — ABNORMAL LOW (ref 0.7–4.0)
MCH: 31.2 pg (ref 26.0–34.0)
MCH: 31.3 pg (ref 26.0–34.0)
MCHC: 33.3 g/dL (ref 30.0–36.0)
MCHC: 33.8 g/dL (ref 30.0–36.0)
MCV: 93.5 fL (ref 80.0–100.0)
MCV: 92.7 fL (ref 80.0–100.0)
Monocytes Absolute: 0.6 10*3/uL (ref 0.1–1.0)
Monocytes Absolute: 0.3 10*3/uL (ref 0.1–1.0)
Monocytes Relative: 5 %
Monocytes Relative: 3 %
Neutro Abs: 9.7 10*3/uL — ABNORMAL HIGH (ref 1.7–7.7)
Neutro Abs: 10 10*3/uL — ABNORMAL HIGH (ref 1.7–7.7)
Neutrophils Relative %: 85 %
Neutrophils Relative %: 91 %
Platelets: 250 10*3/uL (ref 150–400)
Platelets: 238 10*3/uL (ref 150–400)
RBC: 5.07 MIL/uL (ref 4.22–5.81)
RBC: 4.82 MIL/uL (ref 4.22–5.81)
RDW: 13.3 % (ref 11.5–15.5)
RDW: 13.2 % (ref 11.5–15.5)
WBC: 11.5 10*3/uL — ABNORMAL HIGH (ref 4.0–10.5)
WBC: 11 10*3/uL — ABNORMAL HIGH (ref 4.0–10.5)
nRBC: 0.2 % (ref 0.0–0.2)
nRBC: 0 % (ref 0.0–0.2)

## 2020-02-15 LAB — HEPARIN LEVEL (UNFRACTIONATED)
Heparin Unfractionated: <0.1 IU/mL — ABNORMAL LOW (ref 0.30–0.70)
Heparin Unfractionated: 0.51 IU/mL (ref 0.30–0.70)

## 2020-02-15 LAB — TROPONIN I (HIGH SENSITIVITY)
Troponin I (High Sensitivity): 16 ng/L (ref <18)
Troponin I (High Sensitivity): 44 ng/L — ABNORMAL HIGH (ref <18)
Troponin I (High Sensitivity): 72 ng/L — ABNORMAL HIGH (ref <18)
Troponin I (High Sensitivity): 152 ng/L (ref <18)

## 2020-02-15 LAB — PROTIME-INR
INR: 1.2 (ref 0.8–1.2)
Prothrombin Time: 14.6 seconds (ref 11.4–15.2)

## 2020-02-15 LAB — C-REACTIVE PROTEIN: CRP: 8.5 mg/dL — ABNORMAL HIGH (ref <1.0)

## 2020-02-15 LAB — LIPID PANEL
Cholesterol: 76 mg/dL (ref 0–200)
HDL: 39 mg/dL — ABNORMAL LOW (ref >40)
LDL Cholesterol: 17 mg/dL (ref 0–99)
Total CHOL/HDL Ratio: 1.9 RATIO
Triglycerides: 101 mg/dL (ref <150)
VLDL: 20 mg/dL (ref 0–40)

## 2020-02-15 LAB — HEPATITIS B SURFACE ANTIGEN: Hepatitis B Surface Ag: NONREACTIVE

## 2020-02-15 LAB — FERRITIN: Ferritin: 2859 ng/mL — ABNORMAL HIGH (ref 24–336)

## 2020-02-15 LAB — HEMOGLOBIN A1C
Hgb A1c MFr Bld: 7.1 % — ABNORMAL HIGH (ref 4.8–5.6)
Mean Plasma Glucose: 157.07 mg/dL

## 2020-02-15 LAB — APTT: aPTT: 24 seconds (ref 24–36)

## 2020-02-15 LAB — BRAIN NATRIURETIC PEPTIDE: B Natriuretic Peptide: 199.1 pg/mL — ABNORMAL HIGH (ref 0.0–100.0)

## 2020-02-15 LAB — LACTATE DEHYDROGENASE: LDH: 488 U/L — ABNORMAL HIGH (ref 98–192)

## 2020-02-15 LAB — PROCALCITONIN: Procalcitonin: 2.21 ng/mL

## 2020-02-15 LAB — D-DIMER, QUANTITATIVE: D-Dimer, Quant: 18.6 ug/mL-FEU — ABNORMAL HIGH (ref 0.00–0.50)

## 2020-02-15 SURGERY — INVASIVE LAB ABORTED CASE

## 2020-02-15 MED ORDER — BARICITINIB 2 MG PO TABS
4.0000 mg | ORAL_TABLET | Freq: Every day | ORAL | Status: AC
  Administered 2020-02-15 – 2020-02-28 (×14): 4 mg via ORAL
  Filled 2020-02-15 (×17): qty 2

## 2020-02-15 MED ORDER — INSULIN ASPART 100 UNIT/ML ~~LOC~~ SOLN
0.0000 [IU] | Freq: Three times a day (TID) | SUBCUTANEOUS | Status: DC
  Administered 2020-02-15: 5 [IU] via SUBCUTANEOUS
  Administered 2020-02-16 – 2020-02-17 (×4): 3 [IU] via SUBCUTANEOUS
  Administered 2020-02-17: 5 [IU] via SUBCUTANEOUS
  Administered 2020-02-18 – 2020-02-19 (×4): 3 [IU] via SUBCUTANEOUS
  Administered 2020-02-19: 8 [IU] via SUBCUTANEOUS
  Administered 2020-02-19: 3 [IU] via SUBCUTANEOUS
  Administered 2020-02-20: 8 [IU] via SUBCUTANEOUS
  Administered 2020-02-20: 5 [IU] via SUBCUTANEOUS
  Administered 2020-02-20: 3 [IU] via SUBCUTANEOUS
  Administered 2020-02-21: 5 [IU] via SUBCUTANEOUS
  Administered 2020-02-21: 3 [IU] via SUBCUTANEOUS
  Administered 2020-02-21: 5 [IU] via SUBCUTANEOUS
  Administered 2020-02-22: 2 [IU] via SUBCUTANEOUS
  Administered 2020-02-22: 3 [IU] via SUBCUTANEOUS
  Administered 2020-02-22: 2 [IU] via SUBCUTANEOUS
  Administered 2020-02-23: 5 [IU] via SUBCUTANEOUS
  Administered 2020-02-23: 2 [IU] via SUBCUTANEOUS
  Administered 2020-02-24 (×2): 5 [IU] via SUBCUTANEOUS
  Administered 2020-02-25: 2 [IU] via SUBCUTANEOUS
  Administered 2020-02-25: 3 [IU] via SUBCUTANEOUS
  Administered 2020-02-25: 5 [IU] via SUBCUTANEOUS
  Administered 2020-02-26: 2 [IU] via SUBCUTANEOUS
  Administered 2020-02-26 – 2020-02-27 (×2): 3 [IU] via SUBCUTANEOUS
  Administered 2020-02-27 – 2020-02-28 (×5): 2 [IU] via SUBCUTANEOUS
  Administered 2020-02-29: 3 [IU] via SUBCUTANEOUS
  Administered 2020-02-29: 2 [IU] via SUBCUTANEOUS
  Administered 2020-03-01: 5 [IU] via SUBCUTANEOUS
  Administered 2020-03-02: 3 [IU] via SUBCUTANEOUS
  Administered 2020-03-03: 2 [IU] via SUBCUTANEOUS
  Administered 2020-03-03: 5 [IU] via SUBCUTANEOUS
  Administered 2020-03-04 – 2020-03-05 (×4): 3 [IU] via SUBCUTANEOUS
  Administered 2020-03-06 (×2): 2 [IU] via SUBCUTANEOUS
  Administered 2020-03-06: 3 [IU] via SUBCUTANEOUS
  Administered 2020-03-07: 5 [IU] via SUBCUTANEOUS
  Administered 2020-03-07: 2 [IU] via SUBCUTANEOUS
  Administered 2020-03-08 (×2): 3 [IU] via SUBCUTANEOUS

## 2020-02-15 MED ORDER — ASPIRIN 325 MG PO TABS: 325.0000 mg | ORAL_TABLET | Freq: Once | ORAL | Status: AC

## 2020-02-15 MED ORDER — SODIUM CHLORIDE 0.9 % IV SOLN
200.0000 mg | Freq: Once | INTRAVENOUS | Status: AC
  Administered 2020-02-15: 200 mg via INTRAVENOUS
  Filled 2020-02-15: qty 40

## 2020-02-15 MED ORDER — INSULIN ASPART 100 UNIT/ML ~~LOC~~ SOLN
0.0000 [IU] | Freq: Every day | SUBCUTANEOUS | Status: DC
  Administered 2020-02-15 – 2020-02-19 (×3): 2 [IU] via SUBCUTANEOUS
  Administered 2020-02-20: 3 [IU] via SUBCUTANEOUS
  Administered 2020-02-23: 2 [IU] via SUBCUTANEOUS

## 2020-02-15 MED ORDER — MIDAZOLAM HCL 2 MG/2ML IJ SOLN
INTRAMUSCULAR | Status: AC
  Filled 2020-02-15: qty 2

## 2020-02-15 MED ORDER — SODIUM CHLORIDE 0.9 % IV SOLN
100.0000 mg | Freq: Every day | INTRAVENOUS | Status: AC
  Administered 2020-02-16 – 2020-02-19 (×4): 100 mg via INTRAVENOUS
  Filled 2020-02-15 (×5): qty 20

## 2020-02-15 MED ORDER — LIDOCAINE HCL (PF) 1 % IJ SOLN
INTRAMUSCULAR | Status: AC
  Filled 2020-02-15: qty 30

## 2020-02-15 MED ORDER — SODIUM CHLORIDE 0.9 % IV SOLN: INTRAVENOUS | Status: DC

## 2020-02-15 MED ORDER — PREDNISONE 20 MG PO TABS: 50.0000 mg | ORAL_TABLET | Freq: Every day | ORAL | Status: DC

## 2020-02-15 MED ORDER — ROSUVASTATIN CALCIUM 5 MG PO TABS
10.0000 mg | ORAL_TABLET | Freq: Every day | ORAL | Status: DC
  Administered 2020-02-15 – 2020-03-08 (×23): 10 mg via ORAL
  Filled 2020-02-15 (×23): qty 2

## 2020-02-15 MED ORDER — ONDANSETRON HCL 4 MG PO TABS: 4.0000 mg | ORAL_TABLET | Freq: Four times a day (QID) | ORAL | Status: DC | PRN

## 2020-02-15 MED ORDER — FENTANYL CITRATE (PF) 100 MCG/2ML IJ SOLN
INTRAMUSCULAR | Status: AC
  Filled 2020-02-15: qty 2

## 2020-02-15 MED ORDER — NITROGLYCERIN 1 MG/10 ML FOR IR/CATH LAB
INTRA_ARTERIAL | Status: AC
  Filled 2020-02-15: qty 10

## 2020-02-15 MED ORDER — METHYLPREDNISOLONE SODIUM SUCC 125 MG IJ SOLR
85.0000 mg | Freq: Two times a day (BID) | INTRAMUSCULAR | Status: DC
  Administered 2020-02-15 – 2020-02-17 (×4): 85 mg via INTRAVENOUS
  Filled 2020-02-15 (×4): qty 2

## 2020-02-15 MED ORDER — HEPARIN SODIUM (PORCINE) 5000 UNIT/ML IJ SOLN
4000.0000 [IU] | Freq: Once | INTRAMUSCULAR | Status: AC
  Administered 2020-02-15: 4000 [IU] via INTRAVENOUS

## 2020-02-15 MED ORDER — ASCORBIC ACID 500 MG PO TABS
500.0000 mg | ORAL_TABLET | Freq: Every day | ORAL | Status: DC
  Administered 2020-02-15 – 2020-03-08 (×23): 500 mg via ORAL
  Filled 2020-02-15 (×23): qty 1

## 2020-02-15 MED ORDER — ALLOPURINOL 300 MG PO TABS
300.0000 mg | ORAL_TABLET | Freq: Every day | ORAL | Status: DC
  Administered 2020-02-15 – 2020-03-08 (×23): 300 mg via ORAL
  Filled 2020-02-15 (×24): qty 1

## 2020-02-15 MED ORDER — VERAPAMIL HCL 2.5 MG/ML IV SOLN
INTRAVENOUS | Status: AC
  Filled 2020-02-15: qty 2

## 2020-02-15 MED ORDER — TAMSULOSIN HCL 0.4 MG PO CAPS
0.4000 mg | ORAL_CAPSULE | Freq: Every day | ORAL | Status: DC
  Administered 2020-02-15 – 2020-03-07 (×22): 0.4 mg via ORAL
  Filled 2020-02-15 (×22): qty 1

## 2020-02-15 MED ORDER — ONDANSETRON HCL 4 MG/2ML IJ SOLN: 4.0000 mg | Freq: Four times a day (QID) | INTRAMUSCULAR | Status: DC | PRN

## 2020-02-15 MED ORDER — HEPARIN (PORCINE) 25000 UT/250ML-% IV SOLN
1250.0000 [IU]/h | INTRAVENOUS | Status: DC
  Administered 2020-02-15: 1000 [IU]/h via INTRAVENOUS
  Filled 2020-02-15 (×3): qty 250

## 2020-02-15 MED ORDER — ZINC SULFATE 220 (50 ZN) MG PO CAPS
220.0000 mg | ORAL_CAPSULE | Freq: Every day | ORAL | Status: DC
  Administered 2020-02-15 – 2020-03-08 (×23): 220 mg via ORAL
  Filled 2020-02-15 (×23): qty 1

## 2020-02-15 MED ORDER — FUROSEMIDE 20 MG PO TABS
20.0000 mg | ORAL_TABLET | Freq: Every day | ORAL | Status: DC
  Administered 2020-02-15 – 2020-02-24 (×10): 20 mg via ORAL
  Filled 2020-02-15 (×9): qty 1

## 2020-02-15 MED ORDER — HEPARIN (PORCINE) IN NACL 1000-0.9 UT/500ML-% IV SOLN
INTRAVENOUS | Status: AC
  Filled 2020-02-15: qty 1000

## 2020-02-15 MED ORDER — HEPARIN SODIUM (PORCINE) 1000 UNIT/ML IJ SOLN
INTRAMUSCULAR | Status: AC
  Filled 2020-02-15: qty 1

## 2020-02-15 MED ORDER — METOPROLOL SUCCINATE ER 25 MG PO TB24
12.5000 mg | ORAL_TABLET | Freq: Every day | ORAL | Status: DC
  Administered 2020-02-15 – 2020-02-27 (×13): 12.5 mg via ORAL
  Filled 2020-02-15 (×13): qty 1

## 2020-02-15 MED ORDER — IOHEXOL 350 MG/ML SOLN
INTRAVENOUS | Status: AC
  Filled 2020-02-15: qty 1

## 2020-02-15 MED ORDER — ASPIRIN EC 81 MG PO TBEC
81.0000 mg | DELAYED_RELEASE_TABLET | Freq: Every day | ORAL | Status: DC
  Administered 2020-02-16 – 2020-03-08 (×22): 81 mg via ORAL
  Filled 2020-02-15 (×22): qty 1

## 2020-02-15 MED ORDER — GUAIFENESIN-DM 100-10 MG/5ML PO SYRP
10.0000 mL | ORAL_SOLUTION | ORAL | Status: DC | PRN
  Filled 2020-02-15: qty 10

## 2020-02-15 MED ORDER — NITROGLYCERIN IN D5W 200-5 MCG/ML-% IV SOLN
0.0000 ug/min | INTRAVENOUS | Status: DC
  Administered 2020-02-15: 5 ug/min via INTRAVENOUS
  Filled 2020-02-15: qty 250

## 2020-02-15 MED ORDER — HYDROCOD POLST-CPM POLST ER 10-8 MG/5ML PO SUER: 5.0000 mL | Freq: Two times a day (BID) | ORAL | Status: DC | PRN

## 2020-02-15 MED ORDER — ENOXAPARIN SODIUM 40 MG/0.4ML ~~LOC~~ SOLN: 40.0000 mg | SUBCUTANEOUS | Status: DC

## 2020-02-15 MED ORDER — ACETAMINOPHEN 325 MG PO TABS
650.0000 mg | ORAL_TABLET | Freq: Four times a day (QID) | ORAL | Status: DC | PRN
  Administered 2020-03-07: 650 mg via ORAL
  Filled 2020-02-15: qty 2

## 2020-02-15 SURGICAL SUPPLY — 7 items
GLIDESHEATH SLEND SS 6F .021 (SHEATH) IMPLANT
KIT ENCORE 26 ADVANTAGE (KITS) IMPLANT
KIT HEART LEFT (KITS) ×1 IMPLANT
PACK CARDIAC CATHETERIZATION (CUSTOM PROCEDURE TRAY) ×1 IMPLANT
SHEATH PROBE COVER 6X72 (BAG) IMPLANT
TRANSDUCER W/STOPCOCK (MISCELLANEOUS) ×1 IMPLANT
TUBING CIL FLEX 10 FLL-RA (TUBING) ×1 IMPLANT

## 2020-02-15 NOTE — Telephone Encounter (Signed)
New Message   Patient daughter is calling because the patient is in the hospital. They are wanting to get the status of the patient. The last they were told was that he may of had a heart attack and that someone from our team has seen patient. Please advise.

## 2020-02-15 NOTE — Progress Notes (Signed)
ANTICOAGULATION CONSULT NOTE - Initial Consult  Pharmacy Consult for heparin Indication: chest pain/ACS  Allergies  Allergen Reactions  . Lipitor [Atorvastatin Calcium] Other (See Comments)    Patient becomes agitated, has memory issues.    Patient Measurements:   Heparin Dosing Weight: 83.9kg  Vital Signs: Temp: 96.9 F (36.1 C) (09/21 1015) Temp Source: Oral (09/21 1015) BP: 137/68 (09/21 1015) Pulse Rate: 78 (09/21 1015)  Labs: No results for input(s): HGB, HCT, PLT, APTT, LABPROT, INR, HEPARINUNFRC, HEPRLOWMOCWT, CREATININE, CKTOTAL, CKMB, TROPONINIHS in the last 72 hours.  CrCl cannot be calculated (Patient's most recent lab result is older than the maximum 21 days allowed.).   Medical History: Past Medical History:  Diagnosis Date  . Acute systolic heart failure (HCC)    35-40%  . Bladder outlet obstruction   . Blindness of left eye   . CAD S/P percutaneous coronary angioplasty 10/22/2018   Cath-PCA 10/18/2018: 100% mRCA -- (DES PCI-Synergy 3 mm x 28 mm - 3.3 mm).  Otherwise normal coronaries  . Chronic Left leg swelling    a. Since 2007.  Marland Kitchen Essential hypertension   . Gout   . Hemorrhoids 05/12/2014  . Ischemic cardiomyopathy--EF now resolved to 50 to 55%. 10/30/2018   Post inferior STEMI: TTE 10/18/2018: EF 35-40%, diffuse hypokinesis.  GR 1 DD --> resolved 4 months post MI. -Residual inferior and inferolateral and inferoseptal hypokinesis.  . Nephrolithiasis   . PE (pulmonary thromboembolism) (HCC) 10/22/2018  . ST elevation myocardial infarction (STEMI) of inferior wall (HCC) 10/18/2018    PCI/DES x1 to the mRCA  . Type 2 diabetes mellitus (HCC)    Assessment: 67 YOM presenting in respiratory distress in setting of known COVID infection, code STEMI by EMS.  Improved EKG with better O2 sats in ED.  Not on anticoagulation PTA.  Received 4000 unit bolus in ED as code STEMI, currently holding on cath.    Goal of Therapy:  Heparin level 0.3-0.7  units/ml Monitor platelets by anticoagulation protocol: Yes   Plan:  Heparin gtt at 1000 units/hr  F/u 8 hour heparin level Cards plan  Daylene Posey, PharmD Clinical Pharmacist ED Pharmacist Phone # [phone] 02/15/2020 10:37 AM

## 2020-02-15 NOTE — ED Notes (Signed)
[name] [phone] please call with questions or concerns she will be by the phone

## 2020-02-15 NOTE — Progress Notes (Signed)
ANTICOAGULATION CONSULT NOTE - Follow Up Consult  Pharmacy Consult for IV Heparin Indication: chest pain/ACS, R/O PE/DVT  Allergies  Allergen Reactions  . Lipitor [Atorvastatin Calcium] Other (See Comments)    Patient becomes agitated, has memory issues.    Patient Measurements: Height: 6' (182.9 cm) Weight: 83 kg (182 lb 15.7 oz) IBW/kg (Calculated) : 77.6 Heparin Dosing Weight: 83 kg  Vital Signs: Temp: 96.9 F (36.1 C) (09/21 1015) Temp Source: Oral (09/21 1015) BP: 131/70 (09/21 2100) Pulse Rate: 62 (09/21 2106)  Labs: Recent Labs    02/15/20 1022 02/15/20 1022 02/15/20 1049 02/15/20 1218 02/15/20 1322 02/15/20 1522 02/15/20 2040  HGB 15.8  --   --   --  15.1  --   --   HCT 47.4  --   --   --  44.7  --   --   PLT 250  --   --   --  238  --   --   APTT 24  --   --   --   --   --   --   LABPROT 14.6  --   --   --   --   --   --   INR 1.2  --   --   --   --   --   --   HEPARINUNFRC  --   --  <0.10*  --   --   --  0.51  CREATININE 0.99  --   --   --  0.97  --   --   TROPONINIHS 16   < >  --  44* 72* 152*  --    < > = values in this interval not displayed.    Estimated Creatinine Clearance: 64.4 mL/min (by C-G formula based on SCr of 0.97 mg/dL).   Medical History: Past Medical History:  Diagnosis Date  . Acute systolic heart failure (HCC)    35-40%  . Bladder outlet obstruction   . Blindness of left eye   . CAD S/P percutaneous coronary angioplasty 10/22/2018   Cath-PCA 10/18/2018: 100% mRCA -- (DES PCI-Synergy 3 mm x 28 mm - 3.3 mm).  Otherwise normal coronaries  . Chronic Left leg swelling    a. Since 2007.  Marland Kitchen Essential hypertension   . Gout   . Hemorrhoids 05/12/2014  . Ischemic cardiomyopathy--EF now resolved to 50 to 55%. 10/30/2018   Post inferior STEMI: TTE 10/18/2018: EF 35-40%, diffuse hypokinesis.  GR 1 DD --> resolved 4 months post MI. -Residual inferior and inferolateral and inferoseptal hypokinesis.  . Nephrolithiasis   . PE (pulmonary  thromboembolism) (HCC) 10/22/2018  . ST elevation myocardial infarction (STEMI) of inferior wall (HCC) 10/18/2018    PCI/DES x1 to the mRCA  . Type 2 diabetes mellitus [facility])    Assessment: 85 yr old man presented in respiratory distress in setting of known COVID infection, code STEMI by EMS. Pharmacy was consulted to dose heparin; pt was not on anticoagulation PTA.  Pt rec'd heparin 4000 units IV bolus in ED as code STEMI; Cardiology recommended to hold off on cath for now, as pt's chest pain and ECG improved.  Pt has hx of PE in the past for which he took Xarelto; d-dimer elevated at 18.60. CT scan, Dopplers ordered to R/O PE/DVT.  Initial heparin level ~9.5 hrs after initiating heparin infusion at 1000 units/hr is 0.51 units/ml, which is within the goal range for this pt. H/H, platelets WNL. Per ED RN, no issues with  IV or bleeding observed.  Goal of Therapy:  Heparin level 0.3-0.7 units/ml Monitor platelets by anticoagulation protocol: Yes   Plan:  Continue heparin infusion at 1000 units/hr  Check confirmatory heparin level lin 8 hrs Monitor daily heparin level, CBC Monitor for signs/symptoms of bleeding F/U CT, Dopplers  Vicki Mallet, PharmD, BCPS, [facility] Clinical Pharmacist 02/15/2020 9:31 PM

## 2020-02-15 NOTE — Telephone Encounter (Signed)
Returned call to patient's daughter. Patient was transported via EMS to [facility] for acute respiratory failure r/t COVID, cardiac ischemia. Explained that per chart review, Dr. Tresa Endo examined patient in ED with plan for cath when stable from respiratory standpoint. Patient is still in ED and daughter has only been able to talk to ED nurse. Advised she plan to call later tonight, when patient will hopefully have a bed, and speak with his assigned nurse.   Will notify primary MD/nurse

## 2020-02-15 NOTE — ED Triage Notes (Signed)
EMS called for hypoxia-68% SpO2 room air. Mild chest pain with inspiration since yesterday afternoon. After given 200 NS bolus, pt's chest pain increased. Repeat EKG done which showed STEMI. 324 ASA and 1 nitro given PTA.

## 2020-02-15 NOTE — Progress Notes (Signed)
Patient is a stemi. Set up on HFNC per MD based on WOB and o2 sats. 35L/100%. Tolerating well.

## 2020-02-15 NOTE — H&P (Addendum)
History and Physical    Gordon Novak ZOX:[id number] DOB: 12/04/1934 DOA: 02/15/2020  PCP: [name], MD  Patient coming from: home  I have personally briefly reviewed patient's old medical records in [facility]  Chief Complaint: Hypoxia, Covid positive  HPI: Gordon Novak is a 85 y.o. male with medical history significant of coronary artery disease status post PCI in 2020, hypertension, hyperlipidemia, type 2 diabetes mellitus, PE-not on anticoagulation, presents to emergency department for evaluation of hypoxia.  Patient tested positive for COVID-19 on 9/13.  He is fully vaccinated against COVID-19 with [employer].  He began having COVID-19 symptoms 10 days ago such as fever, cough, shortness of breath.  He was evaluated on 9/15 for a fall however at that time he was maintaining oxygen saturation on room air and he had mild symptoms at that time therefore he was discharged home.  Patient reports worsening shortness of breath therefore he called EMS and was found to have oxygen saturation 68% on room air.  He was placed on nonrebreather during transport and began having some chest pain.  EKG showed STEMI inferior leadstherefore code STEMI was called.  He received aspirin prior to arrival.  Repeat EKG shows no ST EMI.  His initial troponin came back negative.  He was started on NTG and heparin drip.  Patient denies chest pain.  Cardiology was consulted who recommended to hold off catheterization for now as patient's chest pain and EKG improved.  His symptoms were likely secondary to hypoxia.  Upon my evaluation: Patient on high flow nasal cannula, not in acute respiratory distress, communicating well, alert and oriented x3.  Reports that he is feeling much better.  Reports some cough and shortness of breath however denies chest pain, wheezing, fever, chills, nausea, vomiting, abdominal pain, diarrhea, urinary changes.  He has chronic left lower extremity swelling since 2007.  He denies  pain or redness.  He denies use of smoking, alcohol, listed drug use.  He wants to remain full code.  ED Course: Upon arrival to ED: Patient was tachypneic, hypoxic requiring high flow nasal cannula, CBC shows leukocytosis of 11.5, troponin x1 -, PT APTT: WNL, BNP: 199, CMP shows AST of 195, ALT: 216, alkaline phosphatase: 144.  Chest x-ray shows multifocal pneumonia.  A1c: 7.1.  Patient started on heparin and NTG drip in ED.  Cardiology consulted.  [facility] hospital[facility] consulted for admission for acute hypoxemic respiratory failure secondary to COVID-19 pneumonia.  Review of Systems: As per HPI otherwise negative.    Past Medical History:  Diagnosis Date  . Acute systolic heart failure (HCC)    35-40%  . Bladder outlet obstruction   . Blindness of left eye   . CAD S/P percutaneous coronary angioplasty 10/22/2018   Cath-PCA 10/18/2018: 100% mRCA -- (DES PCI-Synergy 3 mm x 28 mm - 3.3 mm).  Otherwise normal coronaries  . Chronic Left leg swelling    a. Since 2007.  Marland Kitchen Essential hypertension   . Gout   . Hemorrhoids 05/12/2014  . Ischemic cardiomyopathy--EF now resolved to 50 to 55%. 10/30/2018   Post inferior STEMI: TTE 10/18/2018: EF 35-40%, diffuse hypokinesis.  GR 1 DD --> resolved 4 months post MI. -Residual inferior and inferolateral and inferoseptal hypokinesis.  . Nephrolithiasis   . PE (pulmonary thromboembolism) (HCC) 10/22/2018  . ST elevation myocardial infarction (STEMI) of inferior wall (HCC) 10/18/2018    PCI/DES x1 to the mRCA  . Type 2 diabetes mellitus [facility])     Past Surgical  History:  Procedure Laterality Date  . CATARACT EXTRACTION    . CORONARY/GRAFT ACUTE MI REVASCULARIZATION N/A 10/18/2018   Procedure: Coronary/Graft Acute MI Revascularization - Cornonary Stent Intervention;  Surgeon: Runell Gess, MD;  Location: [facility] INVASIVE CV LAB;;  100% mRCA -- (DES PCI-Synergy 3 mm x 28 mm - 3.3 mm).    . CYSTOSCOPY W/ URETERAL STENT PLACEMENT Left 05/07/2014   Procedure:  CYSTOSCOPY WITH RETROGRADE PYELOGRAM/ LEFT URETERAL STENT PLACEMENT;  Surgeon: Crist Fat, MD;  Location: [facility] ORS;  Service: Urology;  Laterality: Left;  . CYSTOSCOPY WITH RETROGRADE PYELOGRAM, URETEROSCOPY AND STENT PLACEMENT Left 05/14/2014   Procedure: LEFT URETEROSCOPY AND STENT EXCHANGE,STONE BASKETRY EXTRACTION;  Surgeon: Jerilee Field, MD;  Location: [facility] ORS;  Service: Urology;  Laterality: Left;  . HOLMIUM LASER APPLICATION Left 05/14/2014   Procedure: HOLMIUM LASER APPLICATION;  Surgeon: Jerilee Field, MD;  Location: [facility] ORS;  Service: Urology;  Laterality: Left;  . INTRAOCULAR LENS INSERTION Right   . LEFT HEART CATH AND CORONARY ANGIOGRAPHY N/A 10/18/2018   Procedure: LEFT HEART CATH AND CORONARY ANGIOGRAPHY;  Surgeon: Runell Gess, MD;  Location: [facility] INV[facility]IVE CV LAB;;  100% mRCA -- (DES PCI)..  Otherwise normal coronaries  . TRANSTHORACIC ECHOCARDIOGRAM  02/17/2019   EF 50 to 55%.  Residual basal-mid inferolateral and apical lateral as well as basal septal hypokinesis.  Normal RV size and function.  Relatively normal atrial size.  Moderate aortic sclerosis/calcification but no stenosis.  Bowed interatrial septum suggesting elevated RAP.  Mildly elevated PAP.--Significant improvement -> prior EF (initially 35 to 40%) in setting of Inf-lat STEMI May 2020     reports that he has never smoked. He has never used smokeless tobacco. He reports that he does not drink alcohol and does not use drugs.  Allergies  Allergen Reactions  . Lipitor [Atorvastatin Calcium] Other (See Comments)    Patient becomes agitated, has memory issues.    Family History  Problem Relation Age of Onset  . Heart attack Father        died @ 62    Prior to Admission medications   Medication Sig Start Date End Date Taking? Authorizing Provider  allopurinol (ZYLOPRIM) 300 MG tablet Take 300 mg by mouth daily.  02/15/14  Yes [provider]  azithromycin (ZITHROMAX) 250 MG tablet Take 250-500  mg by mouth See admin instructions. Take 500mg  on the first day, then take 250mg  daily until gone. 02/11/20  Yes [provider]  beta carotene w/minerals (OCUVITE) tablet Take 1 tablet by mouth daily.    Yes [provider]  bisacodyl (DULCOLAX) 5 MG EC tablet Take 1 tablet (5 mg total) by mouth daily. 05/14/14  Yes Tat, 02/13/20, MD  clopidogrel (PLAVIX) 75 MG tablet TAKE 1 TABLET BY MOUTH EVERY DAY Patient taking differently: Take 75 mg by mouth daily.  09/02/19  Yes Onalee Hua, MD  docusate sodium (COLACE) 100 MG capsule Take 100 mg by mouth 2 (two) times daily. Take daily at bedtime   Yes [provider]  furosemide (LASIX) 20 MG tablet TAKE 1 TABLET BY MOUTH DAILY. MAY TAKE AN ADDITIONAL 20 MG TABLET IF WEIGHT GAIN IS GREATER THAN 3 LBS AS NEEDED DAILY Patient taking differently: Take 20 mg by mouth See admin instructions. Take 20mg  by mouth once daily.  Take an additional 20mg  as needed for weight gain or leg swelling. 02/14/20  Yes Runell Gess, MD  metFORMIN (GLUCOPHAGE-XR) 500 MG 24 hr tablet Take 500 mg by  mouth daily with breakfast. 10/07/18  Yes [provider]  metoprolol succinate (TOPROL-XL) 25 MG 24 hr tablet TAKE 1/2 TABLET (12.5 MG TOTAL) BY MOUTH AT BEDTIME. Patient taking differently: Take 12.5 mg by mouth at bedtime.  06/08/19  Yes Marykay Lex, MD  Multiple Vitamin (MULTIVITAMIN WITH MINERALS) TABS tablet Take 1 tablet by mouth every evening.    Yes [provider]  nitroGLYCERIN (NITROSTAT) 0.4 MG SL tablet Place 1 tablet (0.4 mg total) under the tongue every 5 (five) minutes x 3 doses as needed for chest pain. 10/20/18  Yes Laverda Page B, NP  potassium chloride SA (K-DUR) 20 MEQ tablet Take 20 mEq by mouth daily.  12/18/17  Yes [provider]  prednisoLONE acetate (PRED FORTE) 1 % ophthalmic suspension Place 1 drop into both eyes at bedtime.  05/09/14  Yes [provider]  predniSONE (STERAPRED UNI-PAK 21  TAB) 5 MG (21) TBPK tablet Take by mouth as directed. 6 day course 02/11/20  Yes [provider]  PROCTOFOAM [facility] rectal foam Place 1 applicator rectally 3 (three) times daily as needed for hemorrhoids or itching.  05/16/14  Yes [provider]  rosuvastatin (CRESTOR) 10 MG tablet Take 1 tablet (10 mg total) by mouth daily. 08/30/19 02/15/20 Yes Marykay Lex, MD  tamsulosin (FLOMAX) 0.4 MG CAPS capsule Take 0.4 mg by mouth at bedtime.    Yes [provider]  valACYclovir (VALTREX) 500 MG tablet Take 500 mg by mouth 2 (two) times daily as needed (cold sores).   Yes [provider]    Physical Exam: Vitals:   02/15/20 1130 02/15/20 1145 02/15/20 1200 02/15/20 1215  BP: 126/65 122/65 124/61 119/66  Pulse: 71 71 70 66  Resp: (!) 24 (!) 25 (!) 24 (!) 24  Temp:      TempSrc:      SpO2: 93% 91% 93% 94%  Weight:      Height:        Constitutional: NAD, calm, comfortable, on high flow nasal cannula, thin and lean, appears dehydrated.  Not in acute distress, communicating well Eyes: PERRL, lids and conjunctivae normal ENMT: Mucous membranes are dry. Posterior pharynx clear of any exudate or lesions.Normal dentition.  Neck: normal, supple, no masses, no thyromegaly Respiratory: Tachypneic clear to auscultation bilaterally, no wheezing, no crackles. Normal respiratory effort. No accessory muscle use.  Cardiovascular: Regular rate and rhythm, no murmurs / rubs / gallops.  Left lower extremity swelling positive.. 2+ pedal pulses. No carotid bruits.  Abdomen: no tenderness, no masses palpated. No hepatosplenomegaly. Bowel sounds positive.  Musculoskeletal: no clubbing / cyanosis. No joint deformity upper and lower extremities. Good ROM, no contractures. Normal muscle tone.  Skin: no rashes, lesions, ulcers. No induration Neurologic: CN 2-12 grossly intact. Sensation intact, DTR normal. Strength 5/5 in all 4.  Psychiatric: Normal judgment and insight. Alert and  oriented x 3. Normal mood.    Labs on Admission: I have personally reviewed following labs and imaging studies  CBC: Recent Labs  Lab 02/15/20 1022  WBC 11.5*  NEUTROABS 9.7*  HGB 15.8  HCT 47.4  MCV 93.5  PLT 250   Basic Metabolic Panel: Recent Labs  Lab 02/15/20 1022  NA 136  K 4.4  CL 102  CO2 21*  GLUCOSE 159*  BUN 22  CREATININE 0.99  CALCIUM 8.4*   GFR: Estimated Creatinine Clearance: 63.1 mL/min (by C-G formula based on SCr of 0.99 mg/dL). Liver Function Tests: Recent Labs  Lab 02/15/20 1022  AST 195*  ALT 216*  ALKPHOS 144*  BILITOT 1.1  PROT 5.9*  ALBUMIN 2.3*   No results for input(s): LIPASE, AMYLASE in the last 168 hours. No results for input(s): AMMONIA in the last 168 hours. Coagulation Profile: Recent Labs  Lab 02/15/20 1022  INR 1.2   Cardiac Enzymes: No results for input(s): CKTOTAL, CKMB, CKMBINDEX, TROPONINI in the last 168 hours. BNP (last 3 results) No results for input(s): PROBNP in the last 8760 hours. HbA1C: Recent Labs    02/15/20 1022  HGBA1C 7.1*   CBG: No results for input(s): GLUCAP in the last 168 hours. Lipid Profile: Recent Labs    02/15/20 1022  CHOL 76  HDL 39*  LDLCALC 17  TRIG 540101  CHOLHDL 1.9   Thyroid Function Tests: No results for input(s): TSH, T4TOTAL, FREET4, T3FREE, THYROIDAB in the last 72 hours. Anemia Panel: No results for input(s): VITAMINB12, FOLATE, FERRITIN, TIBC, IRON, RETICCTPCT in the last 72 hours. Urine analysis:    Component Value Date/Time   COLORURINE AMBER (A) 05/12/2014 0718   APPEARANCEUR CLOUDY (A) 05/12/2014 0718   LABSPEC 1.020 05/12/2014 0718   PHURINE 6.0 05/12/2014 0718   GLUCOSEU NEGATIVE 05/12/2014 0718   HGBUR LARGE (A) 05/12/2014 0718   BILIRUBINUR SMALL (A) 05/12/2014 0718   KETONESUR 15 (A) 05/12/2014 0718   PROTEINUR 100 (A) 05/12/2014 0718   UROBILINOGEN 1.0 05/12/2014 0718   NITRITE NEGATIVE 05/12/2014 0718   LEUKOCYTESUR MODERATE (A) 05/12/2014 0718     Radiological Exams on Admission: DG Chest Port 1 View  Result Date: 02/15/2020 CLINICAL DATA:  COVID positive.  Hypoxia. EXAM: PORTABLE CHEST 1 VIEW COMPARISON:  Oct 22, 2018 FINDINGS: Diffuse bilateral interstitial and airspace opacities, new from prior. No visible pleural effusion. No pneumothorax. Cardiac silhouette is accentuated by low lung volumes and portable technique. Aortic atherosclerosis. IMPRESSION: Diffuse bilateral interstitial and airspace opacities, likely multifocal pneumonia given the clinical history. Electronically Signed   By: Feliberto HartsFrederick S Jones MD   On: 02/15/2020 10:56    EKG: Independently reviewed.  Sinus rhythm.  ST elevation in inferior leads. Assessment/Plan Principal Problem:   Acute hypoxemic respiratory failure due to COVID-19 [facility](HCC) Active Problems:   Acute ST elevation myocardial infarction (STEMI) of inferior wall (HCC)   Type 2 diabetes mellitus (HCC)   Elevated liver enzymes   Essential hypertension   Acute hypoxemic respiratory failure secondary to COVID-19 pneumonia: -Patient tested positive for COVID-19 on 9/13 presented with worsening shortness of breath and hypoxia requiring high flow nasal cannula.  Reviewed chest x-ray.  He is afebrile with leukocytosis of 11.5. -Admit patient to stepdown unit for close monitoring.  On continuous pulse ox.  We will try to wean off of oxygen as tolerated. -Start patient on remdesivir as per pharmacy and Solu-Medrol.  Patient is requiring high flow nasal cannula therefore discussed with the patient about Bracitinib-he denies known history of tuberculosis or hepatitis, we discussed risk and benefits and wants to proceed with Bracitinib -Start on p.o. vitamin, albuterol every 6 hours and antitussive. -Ordered inflammatory markers-all pending. -Get blood culture and procalcitonin level.  Repeat inflammatory markers tomorrow AM. -Monitor vitals closely.  STEMI: -Patient has history of coronary artery disease status  post PCI. -Likely demand ischemia due to hypoxia.  Initial troponin negative.  Patient started on heparin and NTG drip in ED. -Cardiology consulted who recommended to hold off cath as of now as patient is symptom-free and EKG shows improvement.  Trend troponin.  Elevated d dimer: in the  setting of covid 19. Will get Doppler ultrasound lower extremity.  He has history of PE in the past and was on Xarelto.  -We will get CT angio of chest to rule out PE.  Continue heparin gtt.    Elevated liver enzymes: AST: 195, ALT: 216, alkaline phosphatase 144.  PT/INR, APTT: WNL.  Likely in the setting of COVID-19 pneumonia. -We will get right upper quadrant ultrasound.  Hypertension: Stable -Continue metoprolol.  Monitor blood pressure closely.  Type 2 diabetes mellitus: A1c 7.1%.  Hold Metformin for now.  Start patient on sliding scale insulin and monitor blood sugar closely.  Chronic systolic CHF/ischemic cardiomyopathy: Patient appears euvolemic on exam. -Reviewed echo from 02/17/2019 which showed ejection fraction of 50 to 55%. -Continue Lasix.  Strict INO's and daily weight.  Monitor signs of fluid overload.  Check electrolytes. -Continue statin, metoprolol.  Gout: Continue allopurinol  BPH: Continue Flomax  History of PE: Was on Xarelto for 1 year.  Chronic left lower extremity swelling: Patient tells me that he has chronic left lower extremity after a kidney stone removal procedure?  Back in 2007.  Hyperlipidemia: -Continue statin  Please note: Patient's troponin trended up.  Patient reports chest pain only with deep breathing.  I talked to cardiology on-call Dr. Rennis Golden and discussed about elevated troponin.  He recommended to get echo, start patient on aspirin.  We will get CT angio of chest to rule out PE as patient is complaining of pleuritic chest pain.  Continue heparin gtt-no cardiac intervention necessary at this time.  DVT prophylaxis: Heparin/SCD Code Status: Full code-confirmed  with the patient Family Communication: None present at bedside.  Plan of care discussed with patient in length and he verbalized understanding and agreed with it.  I called patient's daughter and discussed plan of care and she verbalized understanding.  Disposition Plan: Home in 3 to 4 days Consults called: Cardiology Admission status: Inpatient   Ollen Bowl MD [facility]  If 7PM-7AM, please contact night-coverage [email or web address] Password [email or web address]  02/15/2020, 1:02 PM

## 2020-02-15 NOTE — ED Provider Notes (Signed)
[facility] EMERGENCY DEPARTMENT Provider Note   CSN: [id number] Arrival date & time: 02/15/20  1002     History No chief complaint on file.   Gordon Novak is a 85 y.o. male.  85yo M w/ PMH including CAD s/p stenting, HTN, PE not currently anticoagulated, T2DM who p/w hypoxia and chest pain.  Patient began having COVID-19 symptoms 10 days ago and subsequently tested positive.  He has had fevers, cough, and progressively worsening shortness of breath.  He was evaluated on 9/15 for a fall and at that time was stable on room air and Covid symptoms were mild therefore he was discharged home.  He reports that recently his shortness of breath has worsened and he has had hypoxia at home.  EMS was called today due to his shortness of breath and they noted that he was 68% on room air initially.  He was placed on nonrebreather and during transport he began having some chest pain.  They obtained a repeat EKG which showed STEMI inferiorly therefore code STEMI was called in transport.  He received aspirin prior to arrival.  He notes that he was having some chest pain last night that has been waxing and waning.  Currently he denies any chest pain.  He has chronic left lower extremity swelling from previous surgery and states that it has been this way for a long time.  The history is provided by the patient and the EMS personnel.       Past Medical History:  Diagnosis Date  . Acute systolic heart failure (HCC)    35-40%  . Bladder outlet obstruction   . Blindness of left eye   . CAD S/P percutaneous coronary angioplasty 10/22/2018   Cath-PCA 10/18/2018: 100% mRCA -- (DES PCI-Synergy 3 mm x 28 mm - 3.3 mm).  Otherwise normal coronaries  . Chronic Left leg swelling    a. Since 2007.  Marland Kitchen Essential hypertension   . Gout   . Hemorrhoids 05/12/2014  . Ischemic cardiomyopathy--EF now resolved to 50 to 55%. 10/30/2018   Post inferior STEMI: TTE 10/18/2018: EF 35-40%, diffuse  hypokinesis.  GR 1 DD --> resolved 4 months post MI. -Residual inferior and inferolateral and inferoseptal hypokinesis.  . Nephrolithiasis   . PE (pulmonary thromboembolism) (HCC) 10/22/2018  . ST elevation myocardial infarction (STEMI) of inferior wall (HCC) 10/18/2018    PCI/DES x1 to the mRCA  . Type 2 diabetes mellitus [facility])     Patient Active Problem List   Diagnosis Date Noted  . Acute hypoxemic respiratory failure due to COVID-19 (HCC) 02/15/2020  . Elevated liver enzymes 02/15/2020  . Essential hypertension   . Presence of drug-eluting stent in right coronary artery 12/02/2018  . Hypotension due to drugs 12/02/2018  . Ischemic cardiomyopathy 10/30/2018  . Personal history of PE (pulmonary embolism) 10/22/2018  . Type 2 diabetes mellitus (HCC) 10/22/2018  . Coronary artery disease involving native coronary artery of native heart with angina pectoris (HCC) 10/22/2018  . Hyperlipidemia with target LDL less than 70 10/20/2018  . Chronic combined systolic and diastolic heart failure (HCC) 10/20/2018  . Acute ST elevation myocardial infarction (STEMI) of inferior wall (HCC) 10/18/2018  . Proctitis 05/14/2014  . Urinary tract infection 05/12/2014  . Leukocytosis 05/12/2014  . Tachycardia 05/12/2014  . Constipation 05/12/2014  . Hemorrhoids 05/12/2014  . Gout 05/12/2014  . Urinary tract infectious disease   . Nephrolithiasis 05/07/2014    Past Surgical History:  Procedure Laterality Date  . CATARACT  EXTRACTION    . CORONARY/GRAFT ACUTE MI REVASCULARIZATION N/A 10/18/2018   Procedure: Coronary/Graft Acute MI Revascularization - Cornonary Stent Intervention;  Surgeon: Runell Gess, MD;  Location: [facility] INVASIVE CV LAB;;  100% mRCA -- (DES PCI-Synergy 3 mm x 28 mm - 3.3 mm).    . CYSTOSCOPY W/ URETERAL STENT PLACEMENT Left 05/07/2014   Procedure: CYSTOSCOPY WITH RETROGRADE PYELOGRAM/ LEFT URETERAL STENT PLACEMENT;  Surgeon: Crist Fat, MD;  Location: [facility] ORS;  Service:  Urology;  Laterality: Left;  . CYSTOSCOPY WITH RETROGRADE PYELOGRAM, URETEROSCOPY AND STENT PLACEMENT Left 05/14/2014   Procedure: LEFT URETEROSCOPY AND STENT EXCHANGE,STONE BASKETRY EXTRACTION;  Surgeon: Jerilee Field, MD;  Location: [facility] ORS;  Service: Urology;  Laterality: Left;  . HOLMIUM LASER APPLICATION Left 05/14/2014   Procedure: HOLMIUM LASER APPLICATION;  Surgeon: Jerilee Field, MD;  Location: [facility] ORS;  Service: Urology;  Laterality: Left;  . INTRAOCULAR LENS INSERTION Right   . LEFT HEART CATH AND CORONARY ANGIOGRAPHY N/A 10/18/2018   Procedure: LEFT HEART CATH AND CORONARY ANGIOGRAPHY;  Surgeon: Runell Gess, MD;  Location: [facility] INVASIVE CV LAB;;  100% mRCA -- (DES PCI)..  Otherwise normal coronaries  . TRANSTHORACIC ECHOCARDIOGRAM  02/17/2019   EF 50 to 55%.  Residual basal-mid inferolateral and apical lateral as well as basal septal hypokinesis.  Normal RV size and function.  Relatively normal atrial size.  Moderate aortic sclerosis/calcification but no stenosis.  Bowed interatrial septum suggesting elevated RAP.  Mildly elevated PAP.--Significant improvement -> prior EF (initially 35 to 40%) in setting of Inf-lat STEMI May 2020       Family History  Problem Relation Age of Onset  . Heart attack Father        died @ 72    Social History   Tobacco Use  . Smoking status: Never Smoker  . Smokeless tobacco: Never Used  Vaping Use  . Vaping Use: Never used  Substance Use Topics  . Alcohol use: No  . Drug use: No    Home Medications Prior to Admission medications   Medication Sig Start Date End Date Taking? Authorizing Provider  allopurinol (ZYLOPRIM) 300 MG tablet Take 300 mg by mouth daily.  02/15/14  Yes [provider]  azithromycin (ZITHROMAX) 250 MG tablet Take 250-500 mg by mouth See admin instructions. Take  on the first day, then take  daily until gone. 02/11/20  Yes [provider]  beta carotene w/minerals (OCUVITE) tablet Take 1  tablet by mouth daily.    Yes [provider]  bisacodyl (DULCOLAX) 5 MG EC tablet Take 1 tablet (5 mg total) by mouth daily. 05/14/14  Yes Tat, Onalee Hua, MD  clopidogrel (PLAVIX) 75 MG tablet TAKE 1 TABLET BY MOUTH EVERY DAY Patient taking differently: Take 75 mg by mouth daily.  09/02/19  Yes Runell Gess, MD  docusate sodium (COLACE) 100 MG capsule Take 100 mg by mouth 2 (two) times daily. Take daily at bedtime   Yes [provider]  furosemide (LASIX) 20 MG tablet TAKE 1 TABLET BY MOUTH DAILY. MAY TAKE AN ADDITIONAL 20 MG TABLET IF WEIGHT GAIN IS GREATER THAN 3 LBS AS NEEDED DAILY Patient taking differently: Take 20 mg by mouth See admin instructions. Take  by mouth once daily.  Take an additional  as needed for weight gain or leg swelling. 02/14/20  Yes Marykay Lex, MD  metFORMIN (GLUCOPHAGE-XR) 500 MG 24 hr tablet Take 500 mg by mouth daily with breakfast. 10/07/18  Yes [provider]  metoprolol succinate (TOPROL-XL) 25 MG 24 hr tablet TAKE 1/2 TABLET (12.5 MG TOTAL) BY MOUTH AT BEDTIME. Patient taking differently: Take 12.5 mg by mouth at bedtime.  06/08/19  Yes Marykay LexHarding, David W, MD  Multiple Vitamin (MULTIVITAMIN WITH MINERALS) TABS tablet Take 1 tablet by mouth every evening.    Yes [provider]  nitroGLYCERIN (NITROSTAT) 0.4 MG SL tablet Place 1 tablet (0.4 mg total) under the tongue every 5 (five) minutes x 3 doses as needed for chest pain. 10/20/18  Yes Laverda Pageoberts, Lindsay B, NP  potassium chloride SA (K-DUR) 20 MEQ tablet Take 20 mEq by mouth daily.  12/18/17  Yes [provider]  prednisoLONE acetate (PRED FORTE) 1 % ophthalmic suspension Place 1 drop into both eyes at bedtime.  05/09/14  Yes [provider]  predniSONE (STERAPRED UNI-PAK 21 TAB) 5 MG (21) TBPK tablet Take by mouth as directed. 6 day course 02/11/20  Yes [provider]  [facility]AM [facility] Place 1 applicator rectally 3 (three) times daily as  needed for hemorrhoids or itching.  05/16/14  Yes [provider]  rosuvastatin (CRESTOR) 10 MG tablet Take 1 tablet (10 mg total) by mouth daily. 08/30/19 02/15/20 Yes Marykay LexHarding, David W, MD  tamsulosin (FLOMAX) 0.4 MG CAPS capsule Take 0.4 mg by mouth at bedtime.    Yes [provider]  valACYclovir (VALTREX) 500 MG tablet Take 500 mg by mouth 2 (two) times daily as needed (cold sores).   Yes [provider]    Allergies    Lipitor [atorvastatin calcium]  Review of Systems   Review of Systems All other systems reviewed and are negative except that which was mentioned in HPI  Physical Exam Updated Vital Signs BP 119/66   Pulse 66   Temp (!) 96.9 F (36.1 C) (Oral)   Resp (!) 24   Ht 6' (1.829 m)   Wt 83 kg   SpO2 94%   BMI 24.82 kg/m   Physical Exam Vitals and nursing note reviewed.  Constitutional:      General: He is not in acute distress.    Appearance: He is well-developed. He is ill-appearing.  HENT:     Head: Normocephalic and atraumatic.  Eyes:     Conjunctiva/sclera: Conjunctivae normal.  Cardiovascular:     Rate and Rhythm: Normal rate and regular rhythm.     Heart sounds: Normal heart sounds. No murmur heard.   Pulmonary:     Breath sounds: No wheezing.     Comments: Mildly tachypneic without distress; diminished b/l bases Abdominal:     General: Bowel sounds are normal. There is no distension.     Palpations: Abdomen is soft.     Tenderness: There is no abdominal tenderness.  Musculoskeletal:        General: No tenderness.     Cervical back: Neck supple.     Left lower leg: Edema present.  Skin:    General: Skin is warm and dry.  Neurological:     Mental Status: He is alert and oriented to person, place, and time.     Comments: Fluent speech  Psychiatric:        Judgment: Judgment normal.     ED Results / Procedures / Treatments   Labs (all labs ordered are listed, but only abnormal results are displayed) Labs Reviewed   HEMOGLOBIN A1C - Abnormal; Notable for the following components:      Result Value   Hgb A1c MFr Bld 7.1 (*)  All other components within normal limits  CBC WITH DIFFERENTIAL/PLATELET - Abnormal; Notable for the following components:   WBC 11.5 (*)    Neutro Abs 9.7 (*)    Abs Immature Granulocytes 0.09 (*)    All other components within normal limits  COMPREHENSIVE METABOLIC PANEL - Abnormal; Notable for the following components:   CO2 21 (*)    Glucose, Bld 159 (*)    Calcium 8.4 (*)    Total Protein 5.9 (*)    Albumin 2.3 (*)    AST 195 (*)    ALT 216 (*)    Alkaline Phosphatase 144 (*)    All other components within normal limits  LIPID PANEL - Abnormal; Notable for the following components:   HDL 39 (*)    All other components within normal limits  HEPARIN LEVEL (UNFRACTIONATED) - Abnormal; Notable for the following components:   Heparin Unfractionated <0.10 (*)    All other components within normal limits  BRAIN NATRIURETIC PEPTIDE - Abnormal; Notable for the following components:   B Natriuretic Peptide 199.1 (*)    All other components within normal limits  CULTURE, BLOOD (ROUTINE X 2)  CULTURE, BLOOD (ROUTINE X 2)  PROTIME-INR  APTT  C-REACTIVE PROTEIN  COMPREHENSIVE METABOLIC PANEL  CBC WITH DIFFERENTIAL/PLATELET  D-DIMER, QUANTITATIVE (NOT AT [facility])  FERRITIN  FIBRINOGEN  HEPATITIS B SURFACE ANTIGEN  LACTATE DEHYDROGENASE  PROCALCITONIN  I-STAT ARTERIAL BLOOD GAS, ED  TROPONIN I (HIGH SENSITIVITY)  TROPONIN I (HIGH SENSITIVITY)  TROPONIN I (HIGH SENSITIVITY)    EKG EKG Interpretation  Date/Time:  Tuesday February 15 2020 10:29:34 EDT Ventricular Rate:  84 PR Interval:    QRS Duration: 79 QT Interval:  376 QTC Calculation: 445 R Axis:   65 Text Interpretation: Sinus rhythm Abnormal R-wave progression, early transition ST elevation, consider inferior injury No significant change since last tracing Confirmed by Frederick Peers [phone]) on 02/15/2020  11:20:02 AM   Radiology DG Chest Port 1 View  Result Date: 02/15/2020 CLINICAL DATA:  COVID positive.  Hypoxia. EXAM: PORTABLE CHEST 1 VIEW COMPARISON:  Oct 22, 2018 FINDINGS: Diffuse bilateral interstitial and airspace opacities, new from prior. No visible pleural effusion. No pneumothorax. Cardiac silhouette is accentuated by low lung volumes and portable technique. Aortic atherosclerosis. IMPRESSION: Diffuse bilateral interstitial and airspace opacities, likely multifocal pneumonia given the clinical history. Electronically Signed   By: Feliberto Harts MD   On: 02/15/2020 10:56    Procedures .Critical Care Performed by: Laurence Spates, MD Authorized by: Laurence Spates, MD   Critical care provider statement:    Critical care time (minutes):  40   Critical care time was exclusive of:  Separately billable procedures and treating other patients   Critical care was necessary to treat or prevent imminent or life-threatening deterioration of the following conditions:  Cardiac failure and respiratory failure   Critical care was time spent personally by me on the following activities:  Development of treatment plan with patient or surrogate, discussions with consultants, evaluation of patient's response to treatment, examination of patient, obtaining history from patient or surrogate, ordering and performing treatments and interventions, ordering and review of laboratory studies, ordering and review of radiographic studies, re-evaluation of patient's condition and review of old charts   (including critical care time)  Medications Ordered in ED Medications  0.9 %  sodium chloride infusion ( Intravenous Not Given 02/15/20 1304)  nitroGLYCERIN 50 mg in dextrose 5 % 250 mL (0.2 mg/mL) infusion (5 mcg/min Intravenous New Bag/Given  02/15/20 1058)  heparin ADULT infusion 100 units/mL (25000 units/257mL sodium chloride 0.45%) (1,000 Units/hr Intravenous New Bag/Given 02/15/20 1101)   remdesivir 200 mg in sodium chloride 0.9% 250 mL IVPB (has no administration in time range)    Followed by  remdesivir 100 mg in sodium chloride 0.9 % 100 mL IVPB (has no administration in time range)  methylPREDNISolone sodium succinate (SOLU-MEDROL) 125 mg/2 mL injection 83.125 mg (has no administration in time range)    Followed by  predniSONE (DELTASONE) tablet 50 mg (has no administration in time range)  guaiFENesin-dextromethorphan (ROBITUSSIN DM) 100-10 MG/5ML syrup 10 mL (has no administration in time range)  chlorpheniramine-HYDROcodone (TUSSIONEX) 10-8 MG/5ML suspension 5 mL (has no administration in time range)  ascorbic acid (VITAMIN C) tablet 500 mg (has no administration in time range)  zinc sulfate capsule 220 mg (has no administration in time range)  baricitinib (OLUMIANT) tablet 4 mg (has no administration in time range)  acetaminophen (TYLENOL) tablet 650 mg (has no administration in time range)  ondansetron (ZOFRAN) tablet 4 mg (has no administration in time range)    Or  ondansetron (ZOFRAN) injection 4 mg (has no administration in time range)  heparin injection 4,000 Units (4,000 Units Intravenous Given 02/15/20 1028)    ED Course  I have reviewed the triage vital signs and the nursing notes.  Pertinent labs & imaging results that were available during my care of the patient were reviewed by me and considered in my medical decision making (see chart for details).    MDM Rules/Calculators/A&P                          Pt initially arrived as code STEMI. His EKG on ambulance showed inferior ST elevation w/ reciprocal changes, however initial EKG here was improved. Repeat EKG with normal O2 sat on NRB then high flow [place] was non-ischemic. Dr. Tresa Endo at bedside. We discussed at length and decided that his pulmonary failure was likely his primary process leading to secondary ischemia due to profound hypoxia. He has been given heparin bolus followed by ggt and started on NTG  ggt. Cardiology will follow along and likely consider catherization when more stabilized from respiratory standpoint. Currently chest pain free.  CXR w/ multifocal opacities c/w COVID-19. Initial trop normal. Mild elevation of LFTs which may be related to viral process. WBC 11.5. Initial trop negative, BNP 199. PT has stabilized on HFNC. He has confirmed that he is FULL CODE.  Discussed admission w/ [facility], Dr. Jacqulyn Bath.   ETAN VASUDEVAN was evaluated in Emergency Department on 02/15/2020 for the symptoms described in the history of present illness. He was evaluated in the context of the global COVID-19 pandemic, which necessitated consideration that the patient might be at risk for infection with the SARS-CoV-2 virus that causes COVID-19. Institutional protocols and algorithms that pertain to the evaluation of patients at risk for COVID-19 are in a state of rapid change based on information released by regulatory bodies including the CDC and federal and state organizations. These policies and algorithms were followed during the patient's care in the ED.  Final Clinical Impression(s) / ED Diagnoses Final diagnoses:  Acute hypoxemic respiratory failure due to COVID-19 [facility])  Cardiac ischemia    Rx / DC Orders ED Discharge Orders    None       Iyahna Obriant, Ambrose Finland, MD 02/15/20 1305

## 2020-02-15 NOTE — Progress Notes (Signed)
   Called by Dr. Jacqulyn Bath regarding Gordon Novak - evaluated today for possible STEMI by Dr. Tresa Endo, however, not taken to the cath lab as it was not felt to be a true STEMI and he is COVID+. Troponins have been trending up to 152. He is on IV heparin, statin and nitro gtts - reports some pleurtic chest pain. Advised starting aspirin. Latest EKG reviewed from 5:13 pm, demonstrates sinus rhythm with some diffuse ST changes, but not diagnostic for STEMI - PVC's noted - could be myopericarditis who could explain the troponin elevation. Recommended repeat echo. D-dimer is quite high - would be reasonable to exclude PE as cause of chest pain. He had a history of PE last year in 09/2018 - was on Xarelto, but completed a course of treatment. D/w Dr. Jacqulyn Bath.  Chrystie Nose, MD, [facility], FACP  [place]  [facility]  Medical Director of the [facility]  Cardiovascular Risk Reduction Clinic Diplomate of the American Board of Clinical Lipidology Attending Cardiologist  Direct Dial: [phone]  Fax: [phone]  Website:  [email or web address]

## 2020-02-16 ENCOUNTER — Inpatient Hospital Stay (HOSPITAL_COMMUNITY): Payer: Medicare Other

## 2020-02-16 DIAGNOSIS — M7989 Other specified soft tissue disorders: Secondary | ICD-10-CM

## 2020-02-16 DIAGNOSIS — I248 Other forms of acute ischemic heart disease: Secondary | ICD-10-CM

## 2020-02-16 DIAGNOSIS — I361 Nonrheumatic tricuspid (valve) insufficiency: Secondary | ICD-10-CM

## 2020-02-16 DIAGNOSIS — I10 Essential (primary) hypertension: Secondary | ICD-10-CM

## 2020-02-16 DIAGNOSIS — I2119 ST elevation (STEMI) myocardial infarction involving other coronary artery of inferior wall: Secondary | ICD-10-CM

## 2020-02-16 DIAGNOSIS — R748 Abnormal levels of other serum enzymes: Secondary | ICD-10-CM

## 2020-02-16 DIAGNOSIS — R7989 Other specified abnormal findings of blood chemistry: Secondary | ICD-10-CM

## 2020-02-16 HISTORY — PX: TRANSTHORACIC ECHOCARDIOGRAM: SHX275

## 2020-02-16 LAB — COMPREHENSIVE METABOLIC PANEL
ALT: 134 U/L — ABNORMAL HIGH (ref 0–44)
AST: 82 U/L — ABNORMAL HIGH (ref 15–41)
Albumin: 1.9 g/dL — ABNORMAL LOW (ref 3.5–5.0)
Alkaline Phosphatase: 110 U/L (ref 38–126)
Anion gap: 11 (ref 5–15)
BUN: 26 mg/dL — ABNORMAL HIGH (ref 8–23)
CO2: 22 mmol/L (ref 22–32)
Calcium: 8 mg/dL — ABNORMAL LOW (ref 8.9–10.3)
Chloride: 107 mmol/L (ref 98–111)
Creatinine, Ser: 0.96 mg/dL (ref 0.61–1.24)
GFR calc Af Amer: >60 mL/min (ref >60)
GFR calc non Af Amer: >60 mL/min (ref >60)
Glucose, Bld: 162 mg/dL — ABNORMAL HIGH (ref 70–99)
Potassium: 4.6 mmol/L (ref 3.5–5.1)
Sodium: 140 mmol/L (ref 135–145)
Total Bilirubin: 1.1 mg/dL (ref 0.3–1.2)
Total Protein: 5.2 g/dL — ABNORMAL LOW (ref 6.5–8.1)

## 2020-02-16 LAB — CBC WITH DIFFERENTIAL/PLATELET
Abs Immature Granulocytes: 0.07 10*3/uL (ref 0.00–0.07)
Basophils Absolute: 0 10*3/uL (ref 0.0–0.1)
Basophils Relative: 0 %
Eosinophils Absolute: 0 10*3/uL (ref 0.0–0.5)
Eosinophils Relative: 0 %
HCT: 41.7 % (ref 39.0–52.0)
Hemoglobin: 14 g/dL (ref 13.0–17.0)
Immature Granulocytes: 1 %
Lymphocytes Relative: 8 %
Lymphs Abs: 0.9 10*3/uL (ref 0.7–4.0)
MCH: 30.9 pg (ref 26.0–34.0)
MCHC: 33.6 g/dL (ref 30.0–36.0)
MCV: 92.1 fL (ref 80.0–100.0)
Monocytes Absolute: 0.4 10*3/uL (ref 0.1–1.0)
Monocytes Relative: 4 %
Neutro Abs: 9.2 10*3/uL — ABNORMAL HIGH (ref 1.7–7.7)
Neutrophils Relative %: 87 %
Platelets: 245 10*3/uL (ref 150–400)
RBC: 4.53 MIL/uL (ref 4.22–5.81)
RDW: 13.3 % (ref 11.5–15.5)
WBC: 10.5 10*3/uL (ref 4.0–10.5)
nRBC: 0 % (ref 0.0–0.2)

## 2020-02-16 LAB — D-DIMER, QUANTITATIVE: D-Dimer, Quant: 5.93 ug/mL-FEU — ABNORMAL HIGH (ref 0.00–0.50)

## 2020-02-16 LAB — ECHOCARDIOGRAM LIMITED
Area-P 1/2: 5.38 cm2
Height: 72 in
S' Lateral: 2.7 cm
Weight: 2927.71 oz

## 2020-02-16 LAB — PHOSPHORUS: Phosphorus: 4.1 mg/dL (ref 2.5–4.6)

## 2020-02-16 LAB — CBG MONITORING, ED: Glucose-Capillary: 164 mg/dL — ABNORMAL HIGH (ref 70–99)

## 2020-02-16 LAB — HEPARIN LEVEL (UNFRACTIONATED)
Heparin Unfractionated: 0.22 IU/mL — ABNORMAL LOW (ref 0.30–0.70)
Heparin Unfractionated: 0.14 IU/mL — ABNORMAL LOW (ref 0.30–0.70)

## 2020-02-16 LAB — FERRITIN: Ferritin: 2329 ng/mL — ABNORMAL HIGH (ref 24–336)

## 2020-02-16 LAB — C-REACTIVE PROTEIN: CRP: 11.7 mg/dL — ABNORMAL HIGH (ref <1.0)

## 2020-02-16 LAB — GLUCOSE, CAPILLARY
Glucose-Capillary: 148 mg/dL — ABNORMAL HIGH (ref 70–99)
Glucose-Capillary: 183 mg/dL — ABNORMAL HIGH (ref 70–99)

## 2020-02-16 LAB — MAGNESIUM: Magnesium: 2.3 mg/dL (ref 1.7–2.4)

## 2020-02-16 MED ORDER — PANTOPRAZOLE SODIUM 40 MG PO TBEC
40.0000 mg | DELAYED_RELEASE_TABLET | Freq: Every day | ORAL | Status: DC
  Administered 2020-02-16 – 2020-03-08 (×22): 40 mg via ORAL
  Filled 2020-02-16 (×21): qty 1

## 2020-02-16 MED ORDER — ENOXAPARIN SODIUM 80 MG/0.8ML ~~LOC~~ SOLN
80.0000 mg | Freq: Two times a day (BID) | SUBCUTANEOUS | Status: DC
  Administered 2020-02-17 (×2): 80 mg via SUBCUTANEOUS
  Filled 2020-02-16 (×2): qty 0.8

## 2020-02-16 MED ORDER — SODIUM CHLORIDE 0.9 % IV SOLN
2.0000 g | INTRAVENOUS | Status: AC
  Administered 2020-02-16 – 2020-02-20 (×5): 2 g via INTRAVENOUS
  Filled 2020-02-16 (×6): qty 20

## 2020-02-16 MED ORDER — SODIUM CHLORIDE 0.9 % IV SOLN
500.0000 mg | INTRAVENOUS | Status: AC
  Administered 2020-02-16 – 2020-02-20 (×5): 500 mg via INTRAVENOUS
  Filled 2020-02-16 (×5): qty 500

## 2020-02-16 MED ORDER — IOHEXOL 350 MG/ML SOLN
100.0000 mL | Freq: Once | INTRAVENOUS | Status: AC | PRN
  Administered 2020-02-16: 100 mL via INTRAVENOUS

## 2020-02-16 MED ORDER — ENOXAPARIN SODIUM 80 MG/0.8ML ~~LOC~~ SOLN
80.0000 mg | SUBCUTANEOUS | Status: AC
  Administered 2020-02-16: 80 mg via SUBCUTANEOUS
  Filled 2020-02-16: qty 0.8

## 2020-02-16 NOTE — ED Notes (Signed)
Daughter updated 

## 2020-02-16 NOTE — Progress Notes (Signed)
Patient transported from ED to [phone] without complications.

## 2020-02-16 NOTE — Progress Notes (Addendum)
ANTICOAGULATION CONSULT NOTE - Follow Up Consult  Pharmacy Consult for IV Heparin>Lovenox Indication: chest pain/ACS, R/O PE/DVT  Allergies  Allergen Reactions  . Lipitor [Atorvastatin Calcium] Other (See Comments)    Patient becomes agitated, has memory issues.    Patient Measurements: Height: 6' (182.9 cm) Weight: 83 kg (182 lb 15.7 oz) IBW/kg (Calculated) : 77.6 Heparin Dosing Weight: 83 kg  Vital Signs: Temp: 77 F (25 C) (09/22 1355) Temp Source: Oral (09/22 1152) BP: 114/53 (09/22 1152) Pulse Rate: 77 (09/22 1152)  Labs: Recent Labs    02/15/20 1022 02/15/20 1022 02/15/20 1049 02/15/20 1218 02/15/20 1322 02/15/20 1522 02/15/20 2040 02/16/20 0500 02/16/20 0527 02/16/20 1319  HGB 15.8   < >  --   --  15.1  --   --   --  14.0  --   HCT 47.4  --   --   --  44.7  --   --   --  41.7  --   PLT 250  --   --   --  238  --   --   --  245  --   APTT 24  --   --   --   --   --   --   --   --   --   LABPROT 14.6  --   --   --   --   --   --   --   --   --   INR 1.2  --   --   --   --   --   --   --   --   --   HEPARINUNFRC  --   --    < >  --   --   --  0.51 0.22*  --  0.14*  CREATININE 0.99  --   --   --  0.97  --   --   --  0.96  --   TROPONINIHS 16  --    < > 44* 72* 152*  --   --   --   --    < > = values in this interval not displayed.    Estimated Creatinine Clearance: 65.1 mL/min (by C-G formula based on SCr of 0.96 mg/dL).   Medical History: Past Medical History:  Diagnosis Date  . Acute systolic heart failure (HCC)    35-40%  . Bladder outlet obstruction   . Blindness of left eye   . CAD S/P percutaneous coronary angioplasty 10/22/2018   Cath-PCA 10/18/2018: 100% mRCA -- (DES PCI-Synergy 3 mm x 28 mm - 3.3 mm).  Otherwise normal coronaries  . Chronic Left leg swelling    a. Since 2007.  Marland Kitchen Essential hypertension   . Gout   . Hemorrhoids 05/12/2014  . Ischemic cardiomyopathy--EF now resolved to 50 to 55%. 10/30/2018   Post inferior STEMI: TTE  10/18/2018: EF 35-40%, diffuse hypokinesis.  GR 1 DD --> resolved 4 months post MI. -Residual inferior and inferolateral and inferoseptal hypokinesis.  . Nephrolithiasis   . PE (pulmonary thromboembolism) (HCC) 10/22/2018  . ST elevation myocardial infarction (STEMI) of inferior wall (HCC) 10/18/2018    PCI/DES x1 to the mRCA  . Type 2 diabetes mellitus [facility])    Assessment: 85 yr old man presented in respiratory distress in setting of known COVID infection, code STEMI by EMS. Pharmacy was consulted to dose heparin; pt was not on anticoagulation PTA.  Pt rec'd heparin 4000 units IV bolus in  ED as code STEMI; Cardiology recommended to hold off on cath for now, as pt's chest pain and ECG improved.  Pt has hx of PE in the past for which he took Xarelto; d-dimer elevated at 18.60. CT scan, Dopplers ordered to R/O PE/DVT.  Heparin came back subtherapeutic again this PM. We will increase the rate for now and d/w Dr. Randol Kern about changing it to Lovenox.   Addendum: Ok to change to Lovenox per Dr. Randol Kern. CrCl>30 ml/min  Goal of Therapy:  Anti-Xa 0.5-1 Monitor platelets by anticoagulation protocol: Yes   Plan:  Change heparin to Lovenox 80mg  SQ q12 Monitor for signs/symptoms of bleeding F/U CT, Dopplers  , PharmD, BCIDP, AAHIVP, CPP Infectious Disease Pharmacist 02/16/2020 2:52 PM

## 2020-02-16 NOTE — Progress Notes (Signed)
ANTICOAGULATION CONSULT NOTE - Follow Up Consult  Pharmacy Consult for IV Heparin Indication: chest pain/ACS, R/O PE/DVT  Assessment: 85 yr old man presented in respiratory distress in setting of known COVID infection, code STEMI by EMS. Pharmacy was consulted to dose heparin; pt was not on anticoagulation PTA.  Pt rec'd heparin 4000 units IV bolus in ED as code STEMI; Cardiology recommended to hold off on cath for now, as pt's chest pain and ECG improved.  Pt has hx of PE in the past for which he took Xarelto; d-dimer elevated at 18.60. CT scan, Dopplers ordered to R/O PE/DVT.  Heparin level this am 0.22 units/ml.  No bleeding noted  Goal of Therapy:  Heparin level 0.3-0.7 units/ml Monitor platelets by anticoagulation protocol: Yes   Plan:  Increase heparin infusion to 1100 units/hr  Check heparin level in ~ 6 hours Monitor daily heparin level, CBC  Thanks for allowing pharmacy to be a part of this patient's care.  Talbert Cage, PharmD Clinical Pharmacist  02/16/2020 6:30 AM

## 2020-02-16 NOTE — Consult Note (Addendum)
Cardiology Consultation:   Patient ID: Gordon Novak MRN: [id number]; DOB: 11/09/1934  Admit date: 02/15/2020 Date of Consult: 02/16/2020  Primary Care Provider: Barbie BannerWilson, Gordon H, MD [facility] Cardiologist: Gordon Lemmaavid Harding, MD  [facility] Electrophysiologist:  None    Patient Profile:   Gordon Novak is a 85 y.o. male with a hx of inferior lateral STEMI status post PCI RCA May 2020, subsegmental PE, ischemic cardiomyopathy, diabetes, hyperlipidemia who is being seen today for the evaluation of elevated troponin/abnormal EKG at the request of Dr. Randol Novak.  History of Present Illness:   Mr. Gordon Novak is an 85 year old male with past medical history noted above.  Followed by Gordon Novak as an outpatient.  He presented in May 2020 with an inferior lateral STEMI with PCI of the RCA.  EF was reduced at 35 to 40%.  He was readmitted 2 days later with subsegmental PE.  He was started on Xarelto.  Apparently recommendations were given to stop Brilinta and switch to Plavix, but this was not done until he was seen in his first follow-up appt. he was started on Toprol and lisinopril and had improvement in his EF to 50 to 55% on subsequent echocardiogram, along with stable RV function and mild RA dilation.  His last visit on 4/21 with Gordon Novak it was noted he had been on Xarelto for almost a year.  Plan was to stop on May 31 and continue on clopidogrel for an additional year.  His lisinopril was stopped secondary to dizziness.  His statin has been stopped because of memory issues.  He was converted to low-dose Crestor.  He was seen in the ED on 02/09/2020 after a mechanical fall. CT head without acute injury and he was discharged home. Notes in the ED reported he had tested positive for COVID 9/10. EKG during that visit showed SR with abnormal r wave progression but no ST/T wave abnormalities.   He then presented to the ED on 9/21 with shortness of breath and hypoxia. On EMS arrival he was hypoxic  with sats at 68% on RA. He was placed on a NRB. Reported chest pain. EKG in the field showed ST elevation in inferior leads. A CODE STEMI was called. He received ASA via EMS. He was evaluated by Gordon Novak in the ED and repeat EKG showed improvement with no ST elevation, PVCs. STEMI was canceled. Started on IV heparin and nitro.   Labs in the ED showed stable electrolytes, AST-195, ALT-216, BNP 199, WBC 11.5, Ddimer 18.60>>5.93. He was admitted to IM for further management. Started on remdesivir and solu-medrol. EKG from 6:13pm showed SR with more diffuse ST elevations raising the question of possible myopericarditis. Echo from today showed normal LV function with normal RV, aneuyrsmal interatrial septum. LE doppler were negative for DVT.   I talked with the patient over the phone. He reports he did stop his Xarelto back on May 31 but has continued his plavix. His main complaint when calling EMS yesterday was shortness of breath and profound weakness. Didn't really have chest pain or pressure. He received his Pfizer vaccines back in january and February. Reports feeling much better today. No chest pain.    Past Medical History:  Diagnosis Date  . Acute systolic heart failure (HCC)    35-40%  . Bladder outlet obstruction   . Blindness of left eye   . CAD S/P percutaneous coronary angioplasty 10/22/2018   Cath-PCA 10/18/2018: 100% mRCA -- (DES PCI-Synergy 3 mm x 28 mm - 3.3  mm).  Otherwise normal coronaries  . Chronic Left leg swelling    a. Since 2007.  Marland Kitchen Essential hypertension   . Gout   . Hemorrhoids 05/12/2014  . Ischemic cardiomyopathy--EF now resolved to 50 to 55%. 10/30/2018   Post inferior STEMI: TTE 10/18/2018: EF 35-40%, diffuse hypokinesis.  GR 1 DD --> resolved 4 months post MI. -Residual inferior and inferolateral and inferoseptal hypokinesis.  . Nephrolithiasis   . PE (pulmonary thromboembolism) (HCC) 10/22/2018  . ST elevation myocardial infarction (STEMI) of inferior wall (HCC)  10/18/2018    PCI/DES x1 to the mRCA  . Type 2 diabetes mellitus (HCC)     Past Surgical History:  Procedure Laterality Date  . CATARACT EXTRACTION    . CORONARY/GRAFT ACUTE MI REVASCULARIZATION N/A 10/18/2018   Procedure: Coronary/Graft Acute MI Revascularization - Cornonary Stent Intervention;  Surgeon: Runell Gess, MD;  Location: [facility];;  100% mRCA -- (DES PCI-Synergy 3 mm x 28 mm - 3.3 mm).    . CYSTOSCOPY W/ URETERAL STENT PLACEMENT Left 05/07/2014   Procedure: CYSTOSCOPY WITH RETROGRADE PYELOGRAM/ LEFT URETERAL STENT PLACEMENT;  Surgeon: Crist Fat, MD;  Location: [facility];  Service: Urology;  Laterality: Left;  . CYSTOSCOPY WITH RETROGRADE PYELOGRAM, URETEROSCOPY AND STENT PLACEMENT Left 05/14/2014   Procedure: LEFT URETEROSCOPY AND STENT EXCHANGE,STONE BASKETRY EXTRACTION;  Surgeon: Jerilee Field, MD;  Location: [facility];  Service: Urology;  Laterality: Left;  . HOLMIUM LASER APPLICATION Left 05/14/2014   Procedure: HOLMIUM LASER APPLICATION;  Surgeon: Jerilee Field, MD;  Location: [facility];  Service: Urology;  Laterality: Left;  . INTRAOCULAR LENS INSERTION Right   . LEFT HEART CATH AND CORONARY ANGIOGRAPHY N/A 10/18/2018   Procedure: LEFT HEART CATH AND CORONARY ANGIOGRAPHY;  Surgeon: Runell Gess, MD;  Location: [facility];;  100% mRCA -- (DES PCI)..  Otherwise normal coronaries  . TRANSTHORACIC ECHOCARDIOGRAM  02/17/2019   EF 50 to 55%.  Residual basal-mid inferolateral and apical lateral as well as basal septal hypokinesis.  Normal RV size and function.  Relatively normal atrial size.  Moderate aortic sclerosis/calcification but no stenosis.  Bowed interatrial septum suggesting elevated RAP.  Mildly elevated PAP.--Significant improvement -> prior EF (initially 35 to 40%) in setting of Inf-lat STEMI May 2020     Home Medications:  Prior to Admission medications   Medication Sig Start Date End Date Taking? Authorizing Provider  allopurinol  (ZYLOPRIM) 300 MG tablet Take 300 mg by mouth daily.  02/15/14  Yes [provider]  azithromycin (ZITHROMAX) 250 MG tablet Take 250-500 mg by mouth See admin instructions. Take 500mg  on the first day, then take 250mg  daily until gone. 02/11/20  Yes [provider]  beta carotene w/minerals (OCUVITE) tablet Take 1 tablet by mouth daily.    Yes [provider]  bisacodyl (DULCOLAX) 5 MG EC tablet Take 1 tablet (5 mg total) by mouth daily. 05/14/14  Yes Tat, 02/13/20, MD  clopidogrel (PLAVIX) 75 MG tablet TAKE 1 TABLET BY MOUTH EVERY DAY Patient taking differently: Take 75 mg by mouth daily.  09/02/19  Yes Onalee Hua, MD  docusate sodium (COLACE) 100 MG capsule Take 100 mg by mouth 2 (two) times daily. Take daily at bedtime   Yes [provider]  furosemide (LASIX) 20 MG tablet TAKE 1 TABLET BY MOUTH DAILY. MAY TAKE AN ADDITIONAL 20 MG TABLET IF WEIGHT GAIN IS GREATER THAN 3 LBS AS NEEDED DAILY Patient taking differently: Take 20 mg by mouth See  admin instructions. Take  by mouth once daily.  Take an additional  as needed for weight gain or leg swelling. 02/14/20  Yes Marykay Lex, MD  metFORMIN (GLUCOPHAGE-XR) 500 MG 24 hr tablet Take 500 mg by mouth daily with breakfast. 10/07/18  Yes [provider]  metoprolol succinate (TOPROL-XL) 25 MG 24 hr tablet TAKE 1/2 TABLET (12.5 MG TOTAL) BY MOUTH AT BEDTIME. Patient taking differently: Take 12.5 mg by mouth at bedtime.  06/08/19  Yes Marykay Lex, MD  Multiple Vitamin (MULTIVITAMIN WITH MINERALS) TABS tablet Take 1 tablet by mouth every evening.    Yes [provider]  nitroGLYCERIN (NITROSTAT) 0.4 MG SL tablet Place 1 tablet (0.4 mg total) under the tongue every 5 (five) minutes x 3 doses as needed for chest pain. 10/20/18  Yes Laverda Page B, NP  potassium chloride SA (K-DUR) 20 MEQ tablet Take 20 mEq by mouth daily.  12/18/17  Yes [provider]  prednisoLONE acetate (PRED  FORTE) 1 % ophthalmic suspension Place 1 drop into both eyes at bedtime.  05/09/14  Yes [provider]  predniSONE (STERAPRED UNI-PAK 21 TAB) 5 MG (21) TBPK tablet Take by mouth as directed. 6 day course 02/11/20  Yes [provider]  PROCTOFOAM [facility] rectal foam Place 1 applicator rectally 3 (three) times daily as needed for hemorrhoids or itching.  05/16/14  Yes [provider]  rosuvastatin (CRESTOR) 10 MG tablet Take 1 tablet (10 mg total) by mouth daily. 08/30/19 02/15/20 Yes Marykay Lex, MD  tamsulosin (FLOMAX) 0.4 MG CAPS capsule Take 0.4 mg by mouth at bedtime.    Yes [provider]  valACYclovir (VALTREX) 500 MG tablet Take 500 mg by mouth 2 (two) times daily as needed (cold sores).   Yes [provider]    Inpatient Medications: Scheduled Meds: . allopurinol  300 mg Oral Daily  . vitamin C  500 mg Oral Daily  . aspirin EC  81 mg Oral Daily  . baricitinib  4 mg Oral Daily  . [START ON 02/17/2020] enoxaparin (LOVENOX) injection  80 mg Subcutaneous BID  . furosemide  20 mg Oral Daily  . insulin aspart  0-15 Units Subcutaneous TID WC  . insulin aspart  0-5 Units Subcutaneous QHS  . methylPREDNISolone (SOLU-MEDROL) injection  85 mg Intravenous Q12H   Followed by  . [START ON 02/18/2020] predniSONE  50 mg Oral Daily  . metoprolol succinate  12.5 mg Oral QHS  . pantoprazole  40 mg Oral Daily  . rosuvastatin  10 mg Oral Daily  . tamsulosin  0.4 mg Oral QHS  . zinc sulfate  220 mg Oral Daily   Continuous Infusions: . sodium chloride    . azithromycin    . cefTRIAXone (ROCEPHIN)  IV 2 g (02/16/20 1445)  . remdesivir 100 mg in NS 100 mL 100 mg (02/16/20 1200)   PRN Meds: acetaminophen, chlorpheniramine-HYDROcodone, guaiFENesin-dextromethorphan, ondansetron **OR** ondansetron (ZOFRAN) IV  Allergies:    Allergies  Allergen Reactions  . Lipitor [Atorvastatin Calcium] Other (See Comments)    Patient becomes agitated, has memory issues.     Social History:   Social History   Socioeconomic History  . Marital status: Married    Spouse name: Not on file  . Number of children: Not on file  . Years of education: Not on file  . Highest education level: Not on file  Occupational History  . Not on file  Tobacco Use  . Smoking status: Never Smoker  .  Smokeless tobacco: Never Used  Vaping Use  . Vaping Use: Never used  Substance and Sexual Activity  . Alcohol use: No  . Drug use: No  . Sexual activity: Not on file  Other Topics Concern  . Not on file  Social History Narrative   Lives in [place] w/ his wife.  Active but does not routinely exercise.  Able to push-mow his yard.  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   Social Determinants of Health   Financial Resource Strain:   . Difficulty of Paying Living Expenses: Not on file  Food Insecurity:   . Worried About Programme researcher, broadcasting/film/video in the Last Year: Not on file  . Ran Out of Food in the Last Year: Not on file  Transportation Needs:   . Lack of Transportation (Medical): Not on file  . Lack of Transportation (Non-Medical): Not on file  Physical Activity:   . Days of Exercise per Week: Not on file  . Minutes of Exercise per Session: Not on file  Stress:   . Feeling of Stress : Not on file  Social Connections:   . Frequency of Communication with Friends and Family: Not on file  . Frequency of Social Gatherings with Friends and Family: Not on file  . Attends Religious Services: Not on file  . Active Member of Clubs or Organizations: Not on file  . Attends Banker Meetings: Not on file  . Marital Status: Not on file  Intimate Partner Violence:   . Fear of Current or Ex-Partner: Not on file  . Emotionally Abused: Not on file  . Physically Abused: Not on file  . Sexually Abused: Not on file    Family History:    Family History  Problem Relation Age of Onset  . Heart attack Father        died @ 1     ROS:  Please see the history of present  illness.   All other ROS reviewed and negative.     Physical Exam/Data:   Vitals:   02/16/20 1015 02/16/20 1152 02/16/20 1355 02/16/20 1512  BP:  (!) 114/53    Pulse:  77    Resp:  18    Temp: (!) 97.1 F (36.2 C) 98.1 F (36.7 C) (!) 77 F (25 C) 98.1 F (36.7 C)  TempSrc: Oral Oral    SpO2:  91% (!) 89%   Weight:      Height:       No intake or output data in the 24 hours ending 02/16/20 1547 Last 3 Weights 02/15/2020 02/09/2020 02/09/2020  Weight (lbs) 182 lb 15.7 oz 185 lb 200 lb  Weight (kg) 83 kg 83.915 kg 90.719 kg     Body mass index is 24.82 kg/m.   Physical Exam Limited due to phone conversation. MD to see  EKG:  The EKG was personally reviewed and demonstrates:   9/21 1029am SR with ST elevation in inferior leads 9/21 1813 SR with more so diffuse ST elevation  Relevant CV Studies:  Echo: 02/16/20  IMPRESSIONS    1. Left ventricular ejection fraction, by estimation, is 55 to 60%.  2. Right ventricular systolic function is normal. The right ventricular  size is normal. There is normal pulmonary artery systolic pressure.  3. The interatrial septum is aneuyrsmal with frequent bowing from right  to left.  4. The mitral valve is normal in structure. Trivial mitral valve  regurgitation.  5. There is mild calcification of the aortic  valve. There is mild  thickening of the aortic valve. Mild aortic valve sclerosis is present,  with no evidence of aortic valve stenosis.   Comparison(s): Compared to prior echo on 01/2019, the LVEF appears mildly  improved to 55-60%.   Laboratory Data:  High Sensitivity Troponin:   Recent Labs  Lab 02/15/20 1022 02/15/20 1218 02/15/20 1322 02/15/20 1522  TROPONINIHS 16 44* 72* 152*     Chemistry Recent Labs  Lab 02/15/20 1022 02/15/20 1322 02/16/20 0527  NA 136 137 140  K 4.4 4.6 4.6  CL 102 105 107  CO2 21* 21* 22  GLUCOSE 159* 194* 162*  BUN 22 22 26*  CREATININE 0.99 0.97 0.96  CALCIUM 8.4* 8.3* 8.0*   GFRNONAA >60 >60 >60  GFRAA >60 >60 >60  ANIONGAP 13 11 11     Recent Labs  Lab 02/15/20 1022 02/15/20 1322 02/16/20 0527  PROT 5.9* 5.6* 5.2*  ALBUMIN 2.3* 2.2* 1.9*  AST 195* 157* 82*  ALT 216* 189* 134*  ALKPHOS 144* 133* 110  BILITOT 1.1 1.1 1.1   Hematology Recent Labs  Lab 02/15/20 1022 02/15/20 1322 02/16/20 0527  WBC 11.5* 11.0* 10.5  RBC 5.07 4.82 4.53  HGB 15.8 15.1 14.0  HCT 47.4 44.7 41.7  MCV 93.5 92.7 92.1  MCH 31.2 31.3 30.9  MCHC 33.3 33.8 33.6  RDW 13.3 13.2 13.3  PLT 250 238 245   BNP Recent Labs  Lab 02/15/20 1040  BNP 199.1*    DDimer  Recent Labs  Lab 02/15/20 1322 02/16/20 0527  DDIMER 18.60* 5.93*     Radiology/Studies:  DG Chest Port 1 View  Result Date: 02/15/2020 CLINICAL DATA:  COVID positive.  Hypoxia. EXAM: PORTABLE CHEST 1 VIEW COMPARISON:  Oct 22, 2018 FINDINGS: Diffuse bilateral interstitial and airspace opacities, new from prior. No visible pleural effusion. No pneumothorax. Cardiac silhouette is accentuated by low lung volumes and portable technique. Aortic atherosclerosis. IMPRESSION: Diffuse bilateral interstitial and airspace opacities, likely multifocal pneumonia given the clinical history. Electronically Signed   By: Feliberto Harts MD   On: 02/15/2020 10:56   VAS [place] LOWER EXTREMITY VENOUS (DVT)  Result Date: 02/16/2020  Lower Venous DVTStudy Indications: Swelling, and elevated d dimer.  Risk Factors: + covid. Performing Technologist: Jannet Askew RCT RDMS  Examination Guidelines: A complete evaluation includes B-mode imaging, spectral Doppler, color Doppler, and power Doppler as needed of all accessible portions of each vessel. Bilateral testing is considered an integral part of a complete examination. Limited examinations for reoccurring indications may be performed as noted. The reflux portion of the exam is performed with the patient in reverse Trendelenburg.   +---------+---------------+---------+-----------+----------+--------------+ RIGHT    CompressibilityPhasicitySpontaneityPropertiesThrombus Aging +---------+---------------+---------+-----------+----------+--------------+ CFV      Full           Yes      Yes                                 +---------+---------------+---------+-----------+----------+--------------+ SFJ      Full                                                        +---------+---------------+---------+-----------+----------+--------------+ FV Prox  Full                                                        +---------+---------------+---------+-----------+----------+--------------+  FV Mid   Full                                                        +---------+---------------+---------+-----------+----------+--------------+ FV DistalFull                                                        +---------+---------------+---------+-----------+----------+--------------+ PFV      Full                                                        +---------+---------------+---------+-----------+----------+--------------+ POP      Full           Yes      Yes                                 +---------+---------------+---------+-----------+----------+--------------+ PTV      Full                                                        +---------+---------------+---------+-----------+----------+--------------+ PERO     Full                                                        +---------+---------------+---------+-----------+----------+--------------+   +---------+---------------+---------+-----------+----------+--------------+ LEFT     CompressibilityPhasicitySpontaneityPropertiesThrombus Aging +---------+---------------+---------+-----------+----------+--------------+ CFV      Full           Yes      Yes                                  +---------+---------------+---------+-----------+----------+--------------+ SFJ      Full                                                        +---------+---------------+---------+-----------+----------+--------------+ FV Prox  Full                                                        +---------+---------------+---------+-----------+----------+--------------+ FV Mid   Full                                                        +---------+---------------+---------+-----------+----------+--------------+  FV DistalFull                                                        +---------+---------------+---------+-----------+----------+--------------+ PFV      Full                                                        +---------+---------------+---------+-----------+----------+--------------+ POP      Full           Yes      Yes                                 +---------+---------------+---------+-----------+----------+--------------+ PTV      Full                                                        +---------+---------------+---------+-----------+----------+--------------+ PERO     Full                                                        +---------+---------------+---------+-----------+----------+--------------+     Summary: RIGHT: - There is no evidence of deep vein thrombosis in the lower extremity.  - No cystic structure found in the popliteal fossa.  LEFT: - There is no evidence of deep vein thrombosis in the lower extremity.  - No cystic structure found in the popliteal fossa.  *See table(s) above for measurements and observations.    Preliminary    ABORTED INVASIVE LAB PROCEDURE  Result Date: 02/15/2020 This case was aborted.  US Abdomen Limited RUQ  Result Date: 02/15/2020 CLINICAL DATA:  Elevated liver enzymes. EXAM: ULTRASOUND ABDOMEN LIMITED RIGHT UPPER QUADRANT COMPARISON:  CT abdomen 07/06/2019. FINDINGS: Gallbladder: Gallstones  measuring up to 7 mm noted. Gallbladder wall thickness normal. No Murphy sign. Common bile duct: Diameter: Not visualized due to overlying bowel gas. Liver: Left no not visualized due to overlying bowel gas. No focal lesion identified. Within normal limits in parenchymal echogenicity. Portal vein is patent on color Doppler imaging with normal direction of blood flow towards the liver. Other: Limited study due to overlying bowel gas. IMPRESSION: 1.  Multiple small gallstones.  No gallbladder wall thickening. 2. Limited exam due to overlying bowel gas. Common bile duct not visualized. Portions of the liver not visualized. Electronically Signed   By: Maisie Fus  Register   On: 02/15/2020 15:35    Assessment and Plan:   Gordon Novak is a 85 y.o. male with a hx of inferior lateral STEMI status post PCI RCA May 2020, subsegmental PE, ischemic cardiomyopathy, diabetes, hyperlipidemia who is being seen today for the evaluation of elevated troponin/abnormal EKG at the request of Dr. Randol Kern.  1. Abnormal EKG in the setting of hypoxia: Initially called a STEMI but canceled on arrival by Dr. Tresa Endo  as ST changes had resolved. Sats were in the 60% on EMS arrival but improved on NRB. Follow up ekg showed more diffuse ST elevation appearing consistent with a myopericarditis. He reports he didn't really have chest pain prior to admission but felt tight in his chest. hsTn peaked at 152. Initially placed on IV heparin on admission but has been transitioned to lovenox. -- given his symptoms have improved will review with MD regarding treatment. He is already steroids for COVID. Suspect no further interventions. Would continue the same.   2. Acute hypoxia 2/2 COVID PNA: inflammatory markers elevated on admission but now trending down.  -- being treated with remdesivir, steriods, antibiotics and baricitib  3. Hx of PE: he stopped his Xarelto back on 5/31 after a year of treatment. Ddimer was elevated and now trending down.  Difficult to interpret in the setting of COVID. LE dopplers were neg. CTA is ordered.    4. Elevated liver enzymes: suspect 2/2 to COVID now down trending. Abd [place] with no acute process.  5. Hx of ICM: EF down to 35-40% after his MI with recovery shortly after to 50-55%. Echo this admission with normal EF. BNP mildly elevated, on lasix 20mg  daily.  -- on metoprolol 12.5mg  daily. Lisinopril was stopped at last office visit as he c/o dizziness.   For questions or updates, please contact [facility] Please consult [email or web address] for contact info under    Signed, , NP  02/16/2020 3:47 PM   Patient seen, examined. Available data reviewed. Agree with findings, assessment, and plan as outlined by 02/18/2020, NP.  The patient is independently interviewed and examined.  He is currently on oxygen per facemask but is breathing comfortably.  JVP is mildly elevated.  HEENT is normal.  Lungs with diffuse rhonchi.  Heart is regular rate and rhythm with no murmur or gallop.  Abdomen is soft and nontender.  Extremities have no edema.  Skin is warm and dry with no rash.  EKG shows sinus bradycardia with mild diffuse ST elevation (submillimeter).  There are PVCs.  Troponin levels are only mildly elevated trending from 44-72 -152.  The patient has had no chest pain or symptoms reminiscent of his heart attack.  I do not think he needs further cardiac evaluation at this time.  He has had an echocardiogram which shows normal LV function with LVEF 55 to 60%.  Would continue him on metoprolol, aspirin, and a statin drug.  No other cardiac-specific recommendations at this time.  Please call if any questions arise.  Laverda Page, M.D. 02/16/2020 6:35 PM

## 2020-02-16 NOTE — Progress Notes (Signed)
PROGRESS NOTE                                                                                                                                                                                                             Patient Demographics:    Gordon Novak, is a 85 y.o. male, DOB - 1934-07-19, NWG:[id number]  Admit date - 02/15/2020   Admitting Physician Ollen Bowl, MD  Outpatient Primary MD for the patient is [name], MD  LOS - 1   No chief complaint on file.      Brief Narrative     85 y.o. male with medical history significant of coronary artery disease status post PCI in 2020, hypertension, hyperlipidemia, type 2 diabetes mellitus, PE-not on anticoagulation, presents to emergency department for evaluation of hypoxia.  Patient tested positive for COVID-19 on 9/13.  He is fully vaccinated against COVID-19 with [employer].  He began having COVID-19 symptoms 10 days ago such as fever, cough, shortness of breath.  He was evaluated on 9/15 for a fall however at that time he was maintaining oxygen saturation on room air and he had mild symptoms at that time therefore he was discharged home.  Patient reports worsening shortness of breath therefore he called EMS and was found to have oxygen saturation 68% on room air.  He was placed on nonrebreather during transport and began having some chest pain.  EKG showed STEMI inferior leadstherefore code STEMI was called.  He received aspirin prior to arrival.  Repeat EKG shows no ST EMI.  His initial troponin came back negative.  He was started on NTG and heparin drip.  Patient denies chest pain.  Cardiology was consulted who recommended to hold off catheterization for now as patient's chest pain and EKG improved.  His symptoms were likely secondary to hypoxia.  Upon my evaluation: Patient on high flow nasal cannula, not in acute respiratory distress, communicating well, alert and oriented x3.  Reports that  he is feeling much better.  Reports some cough and shortness of breath however denies chest pain, wheezing, fever, chills, nausea, vomiting, abdominal pain, diarrhea, urinary changes.  He has chronic left lower extremity swelling since 2007.  He denies pain or redness.  He denies use of smoking, alcohol, listed drug use.  He wants to remain full code.  ED Course: Upon  arrival to ED: Patient was tachypneic, hypoxic requiring high flow nasal cannula, CBC shows leukocytosis of 11.5, troponin x1 -, PT APTT: WNL, BNP: 199, CMP shows AST of 195, ALT: 216, alkaline phosphatase: 144.  Chest x-ray shows multifocal pneumonia.  A1c: 7.1.  Patient started on heparin and NTG drip in ED.  Cardiology consulted.  [facility] consulted for admission for acute hypoxemic respiratory failure secondary to COVID-19 pneumonia.    Subjective:    Gordon Novak today as of cough, dyspnea, he denies any chest pain currently.  .   Assessment  & Plan :    Principal Problem:   Acute hypoxemic respiratory failure due to COVID-19 [facility]) Active Problems:   Type 2 diabetes mellitus (HCC)   Elevated liver enzymes   Essential hypertension   Acute hypoxemic respiratory failure secondary to COVID-19 pneumonia: -Patient is fully vaccinated against Covid, this is breakthrough COVID-19 infection. -Peers to be having severe COVID-19 pneumonia, with significant hypoxia with significant oxygen requirement 35 L heated high flow nasal cannula and 15 L NRB. - Continue with IV steroids. -Continue with IV remdesivir -Continue with baricitinib. -Continue to trend inflammatory markers specially with elevated CRP and D-dimers. -Elevated procalcitonin at 2.2, will start on IV Rocephin and azithromycin for community-acquired pneumonia. -Cussed at length with the patient, he was encouraged use incentive spirometry, flutter valve, out of bed to chair, and to prone once in bed..  Elevated troponins -Most likely due to demand  ischemia from his hypoxia. -Patient has history of coronary artery disease status post PCI. -Cardiology input greatly appreciated, could be myopericarditis, follow on 2D echo, as well elevated D-dimers, continue with heparin drip till CTA rules out PE. -Will place official cardiology consult per family request   Elevated d dimer:  -in the setting of covid 9.  But as well high risk for VTE especially in the setting of bilateral PE during recent hospitalization, for now continue with heparin GTT, follow on venous Dopplers and CTA chest. -We will check venous Doppler as well.  Elevated liver enzymes: -To COVID-19 infection, continue to trend. -Right upper quadrant ultrasound significant for gallstone, but no evidence of acute process.  Hypertension: Stable -Continue metoprolol.  Monitor blood pressure closely.  Type 2 diabetes mellitus: A1c 7.1%.  -  Hold Metformin for now.  Start patient on sliding scale insulin and monitor blood sugar closely.  Chronic systolic CHF/ischemic cardiomyopathy: Patient appears euvolemic on exam. -Reviewed echo from 02/17/2019 which showed ejection fraction of 50 to 55%. -Continue Lasix.  Strict INO's and daily weight.  Monitor signs of fluid overload.  Check electrolytes. -Continue statin, metoprolol.  Gout: Continue allopurinol  BPH: Continue Flomax  History of PE: Was on Xarelto for 1 year.  Chronic left lower extremity swelling: Patient tells me that he has chronic left lower extremity after a kidney stone removal procedure?  Back in 2007.  Hyperlipidemia: -Continue statin   COVID-19 Labs  Recent Labs    02/15/20 1322 02/16/20 0527  DDIMER 18.60* 5.93*  FERRITIN 2,859* 2,329*  LDH 488*  --   CRP 8.5* 11.7*    Lab Results  Component Value Date   SARSCOV2NAA NEGATIVE 10/22/2018   SARSCOV2NAA NEGATIVE 10/18/2018     Code Status : Full  Family Communication  : daughter updated by Phone  Disposition Plan  :  Status is:  Inpatient  Remains inpatient appropriate because:Hemodynamically unstable, Unsafe d/c plan and IV treatments appropriate due to intensity of illness or inability to take PO   Dispo:  The patient is from: Home              Anticipated d/c is to: Home              Anticipated d/c date is: > 3 days              Patient currently is not medically stable to d/c.     Consults  :  cardiology  Procedures  : none  DVT Prophylaxis  :  On Heparin GTT  Lab Results  Component Value Date   PLT 245 02/16/2020    Antibiotics  :   Anti-infectives (From admission, onward)   Start     Dose/Rate Route Frequency Ordered Stop   02/16/20 1000  remdesivir 100 mg in sodium chloride 0.9 % 100 mL IVPB       "Followed by" Linked Group Details   100 mg 200 mL/hr over 30 Minutes Intravenous Daily 02/15/20 1301 02/20/20 0959   02/15/20 1400  remdesivir 200 mg in sodium chloride 0.9% 250 mL IVPB       "Followed by" Linked Group Details   200 mg 580 mL/hr over 30 Minutes Intravenous Once 02/15/20 1301 02/15/20 1504        Objective:   Vitals:   02/16/20 0915 02/16/20 0930 02/16/20 1015 02/16/20 1152  BP: 119/64 117/70  (!) 114/53  Pulse: 75 69  77  Resp: 12 17  18   Temp:   (!) 97.1 F (36.2 C) 98.1 F (36.7 C)  TempSrc:   Oral Oral  SpO2: 99% 97%  91%  Weight:      Height:        Wt Readings from Last 3 Encounters:  02/15/20 83 kg  02/09/20 83.9 kg  08/30/19 83 kg     Intake/Output Summary (Last 24 hours) at 02/16/2020 1236 Last data filed at 02/15/2020 1504 Gross per 24 hour  Intake 279.61 ml  Output 50 ml  Net 229.61 ml     Physical Exam  Awake Alert, Oriented X 3, No new F.N deficits, Normal affect Symmetrical Chest wall movement, Good air movement bilaterally, CTAB RRR,No Gallops,Rubs or new Murmurs, No Parasternal Heave +ve B.Sounds, Abd Soft, No tenderness,No rebound - guarding or rigidity. No Cyanosis, Clubbing , sniffing and left lower extremity edema +2, no right  lower extremity edema.    Data Review:    CBC Recent Labs  Lab 02/15/20 1022 02/15/20 1322 02/16/20 0527  WBC 11.5* 11.0* 10.5  HGB 15.8 15.1 14.0  HCT 47.4 44.7 41.7  PLT 250 238 245  MCV 93.5 92.7 92.1  MCH 31.2 31.3 30.9  MCHC 33.3 33.8 33.6  RDW 13.3 13.2 13.3  LYMPHSABS 1.1 0.6* 0.9  MONOABS 0.6 0.3 0.4  EOSABS 0.0 0.0 0.0  BASOSABS 0.0 0.0 0.0    Chemistries  Recent Labs  Lab 02/15/20 1022 02/15/20 1322 02/16/20 0527  NA 136 137 140  K 4.4 4.6 4.6  CL 102 105 107  CO2 21* 21* 22  GLUCOSE 159* 194* 162*  BUN 22 22 26*  CREATININE 0.99 0.97 0.96  CALCIUM 8.4* 8.3* 8.0*  MG  --   --  2.3  AST 195* 157* 82*  ALT 216* 189* 134*  ALKPHOS 144* 133* 110  BILITOT 1.1 1.1 1.1   ------------------------------------------------------------------------------------------------------------------ Recent Labs    02/15/20 1022  CHOL 76  HDL 39*  LDLCALC 17  TRIG 02/17/20  CHOLHDL 1.9    Lab Results  Component Value Date  HGBA1C 7.1 (H) 02/15/2020   ------------------------------------------------------------------------------------------------------------------ No results for input(s): TSH, T4TOTAL, T3FREE, THYROIDAB in the last 72 hours.  Invalid input(s): FREET3 ------------------------------------------------------------------------------------------------------------------ Recent Labs    02/15/20 1322 02/16/20 0527  FERRITIN 2,859* 2,329*    Coagulation profile Recent Labs  Lab 02/15/20 1022  INR 1.2    Recent Labs    02/15/20 1322 02/16/20 0527  DDIMER 18.60* 5.93*    Cardiac Enzymes No results for input(s): CKMB, TROPONINI, MYOGLOBIN in the last 168 hours.  Invalid input(s): CK ------------------------------------------------------------------------------------------------------------------    Component Value Date/Time   BNP 199.1 (H) 02/15/2020 1040    Inpatient Medications  Scheduled Meds: . allopurinol  300 mg Oral Daily  .  vitamin C  500 mg Oral Daily  . aspirin EC  81 mg Oral Daily  . baricitinib  4 mg Oral Daily  . furosemide  20 mg Oral Daily  . insulin aspart  0-15 Units Subcutaneous TID WC  . insulin aspart  0-5 Units Subcutaneous QHS  . methylPREDNISolone (SOLU-MEDROL) injection  85 mg Intravenous Q12H   Followed by  . [START ON 02/18/2020] predniSONE  50 mg Oral Daily  . metoprolol succinate  12.5 mg Oral QHS  . rosuvastatin  10 mg Oral Daily  . tamsulosin  0.4 mg Oral QHS  . zinc sulfate  220 mg Oral Daily   Continuous Infusions: . sodium chloride    . heparin 1,100 Units/hr (02/16/20 [id number])  . remdesivir 100 mg in NS 100 mL     PRN Meds:.acetaminophen, chlorpheniramine-HYDROcodone, guaiFENesin-dextromethorphan, ondansetron **OR** ondansetron (ZOFRAN) IV  Micro Results No results found for this or any previous visit (from the past 240 hour(s)).  Radiology Reports CT Head Wo Contrast  Result Date: 02/09/2020 CLINICAL DATA:  Trauma, fall on blood thinners EXAM: CT HEAD WITHOUT CONTRAST CT CERVICAL SPINE WITHOUT CONTRAST TECHNIQUE: Multidetector CT imaging of the head and cervical spine was performed following the standard protocol without intravenous contrast. Multiplanar CT image reconstructions of the cervical spine were also generated. COMPARISON:  None. FINDINGS: CT HEAD FINDINGS Brain: No acute territorial infarction, hemorrhage, or intracranial mass. Moderate atrophy. Mild hypodensity in the white matter consistent with chronic small vessel ischemic change. The ventricles are nonenlarged. Vascular: No hyperdense vessels.  Carotid vascular calcification. Skull: No depressed skull fracture. Sinuses/Orbits: Mucosal thickening in the maxillary and ethmoid sinuses. Small fluid level in the left sphenoid sinus but no central skull base lucency is seen. Possible chronic left nasal bone fracture. Other: Mild right posterior scalp swelling. CT CERVICAL SPINE FINDINGS Alignment: No subluxation.  Facet  alignment within normal limits. Skull base and vertebrae: No acute fracture. No primary bone lesion or focal pathologic process. Soft tissues and spinal canal: No prevertebral fluid or swelling. No visible canal hematoma. Disc levels: Multiple level degenerative change with moderate severe disease at C5-C6 and C6-C7. Facet degenerative changes at multiple levels with foraminal narrowing. Upper chest: Apical scarring Other: None IMPRESSION: 1. No CT evidence for acute intracranial abnormality. Atrophy and mild chronic small vessel ischemic changes of the white matter. 2. Degenerative changes of the cervical spine. No acute osseous abnormality. Electronically Signed   By: Jasmine PangKim  Fujinaga M.D.   On: 02/09/2020 01:13   CT Cervical Spine Wo Contrast  Result Date: 02/09/2020 CLINICAL DATA:  Trauma, fall on blood thinners EXAM: CT HEAD WITHOUT CONTRAST CT CERVICAL SPINE WITHOUT CONTRAST TECHNIQUE: Multidetector CT imaging of the head and cervical spine was performed following the standard protocol without intravenous contrast. Multiplanar CT image reconstructions  of the cervical spine were also generated. COMPARISON:  None. FINDINGS: CT HEAD FINDINGS Brain: No acute territorial infarction, hemorrhage, or intracranial mass. Moderate atrophy. Mild hypodensity in the white matter consistent with chronic small vessel ischemic change. The ventricles are nonenlarged. Vascular: No hyperdense vessels.  Carotid vascular calcification. Skull: No depressed skull fracture. Sinuses/Orbits: Mucosal thickening in the maxillary and ethmoid sinuses. Small fluid level in the left sphenoid sinus but no central skull base lucency is seen. Possible chronic left nasal bone fracture. Other: Mild right posterior scalp swelling. CT CERVICAL SPINE FINDINGS Alignment: No subluxation.  Facet alignment within normal limits. Skull base and vertebrae: No acute fracture. No primary bone lesion or focal pathologic process. Soft tissues and spinal canal:  No prevertebral fluid or swelling. No visible canal hematoma. Disc levels: Multiple level degenerative change with moderate severe disease at C5-C6 and C6-C7. Facet degenerative changes at multiple levels with foraminal narrowing. Upper chest: Apical scarring Other: None IMPRESSION: 1. No CT evidence for acute intracranial abnormality. Atrophy and mild chronic small vessel ischemic changes of the white matter. 2. Degenerative changes of the cervical spine. No acute osseous abnormality. Electronically Signed   By: Jasmine Pang M.D.   On: 02/09/2020 01:13   DG Chest Port 1 View  Result Date: 02/15/2020 CLINICAL DATA:  COVID positive.  Hypoxia. EXAM: PORTABLE CHEST 1 VIEW COMPARISON:  Oct 22, 2018 FINDINGS: Diffuse bilateral interstitial and airspace opacities, new from prior. No visible pleural effusion. No pneumothorax. Cardiac silhouette is accentuated by low lung volumes and portable technique. Aortic atherosclerosis. IMPRESSION: Diffuse bilateral interstitial and airspace opacities, likely multifocal pneumonia given the clinical history. Electronically Signed   By: Feliberto Harts MD   On: 02/15/2020 10:56   ABORTED INVASIVE LAB PROCEDURE  Result Date: 02/15/2020 This case was aborted.  US Abdomen Limited RUQ  Result Date: 02/15/2020 CLINICAL DATA:  Elevated liver enzymes. EXAM: ULTRASOUND ABDOMEN LIMITED RIGHT UPPER QUADRANT COMPARISON:  CT abdomen 07/06/2019. FINDINGS: Gallbladder: Gallstones measuring up to 7 mm noted. Gallbladder wall thickness normal. No Murphy sign. Common bile duct: Diameter: Not visualized due to overlying bowel gas. Liver: Left no not visualized due to overlying bowel gas. No focal lesion identified. Within normal limits in parenchymal echogenicity. Portal vein is patent on color Doppler imaging with normal direction of blood flow towards the liver. Other: Limited study due to overlying bowel gas. IMPRESSION: 1.  Multiple small gallstones.  No gallbladder wall thickening.  2. Limited exam due to overlying bowel gas. Common bile duct not visualized. Portions of the liver not visualized. Electronically Signed   By: Maisie Fus  Register   On: 02/15/2020 15:35     Huey Bienenstock M.D on 02/16/2020 at 12:36 PM    [facility] -  Office  [phone]

## 2020-02-16 NOTE — Progress Notes (Signed)
Patient holding in ED for progressive bed and was seen in f/u rounds. Appears to be stable with no acute needs. Care discussed with his RN.

## 2020-02-16 NOTE — Progress Notes (Signed)
Lower Ext study completed.  ° °See CVProc for preliminary results.  ° °Neeka Urista, RDMS, RVT ° °

## 2020-02-17 DIAGNOSIS — E119 Type 2 diabetes mellitus without complications: Secondary | ICD-10-CM

## 2020-02-17 LAB — MAGNESIUM: Magnesium: 2.5 mg/dL — ABNORMAL HIGH (ref 1.7–2.4)

## 2020-02-17 LAB — COMPREHENSIVE METABOLIC PANEL
ALT: 114 U/L — ABNORMAL HIGH (ref 0–44)
AST: 63 U/L — ABNORMAL HIGH (ref 15–41)
Albumin: 2 g/dL — ABNORMAL LOW (ref 3.5–5.0)
Alkaline Phosphatase: 107 U/L (ref 38–126)
Anion gap: 13 (ref 5–15)
BUN: 33 mg/dL — ABNORMAL HIGH (ref 8–23)
CO2: 18 mmol/L — ABNORMAL LOW (ref 22–32)
Calcium: 8.2 mg/dL — ABNORMAL LOW (ref 8.9–10.3)
Chloride: 110 mmol/L (ref 98–111)
Creatinine, Ser: 1.07 mg/dL (ref 0.61–1.24)
GFR calc Af Amer: >60 mL/min (ref >60)
GFR calc non Af Amer: >60 mL/min (ref >60)
Glucose, Bld: 252 mg/dL — ABNORMAL HIGH (ref 70–99)
Potassium: 4.1 mmol/L (ref 3.5–5.1)
Sodium: 141 mmol/L (ref 135–145)
Total Bilirubin: 0.8 mg/dL (ref 0.3–1.2)
Total Protein: 5.9 g/dL — ABNORMAL LOW (ref 6.5–8.1)

## 2020-02-17 LAB — CBC WITH DIFFERENTIAL/PLATELET
Abs Immature Granulocytes: 0.14 10*3/uL — ABNORMAL HIGH (ref 0.00–0.07)
Basophils Absolute: 0 10*3/uL (ref 0.0–0.1)
Basophils Relative: 0 %
Eosinophils Absolute: 0 10*3/uL (ref 0.0–0.5)
Eosinophils Relative: 0 %
HCT: 45.7 % (ref 39.0–52.0)
Hemoglobin: 15.4 g/dL (ref 13.0–17.0)
Immature Granulocytes: 1 %
Lymphocytes Relative: 6 %
Lymphs Abs: 0.8 10*3/uL (ref 0.7–4.0)
MCH: 31.2 pg (ref 26.0–34.0)
MCHC: 33.7 g/dL (ref 30.0–36.0)
MCV: 92.7 fL (ref 80.0–100.0)
Monocytes Absolute: 0.4 10*3/uL (ref 0.1–1.0)
Monocytes Relative: 3 %
Neutro Abs: 11.1 10*3/uL — ABNORMAL HIGH (ref 1.7–7.7)
Neutrophils Relative %: 90 %
Platelets: 290 10*3/uL (ref 150–400)
RBC: 4.93 MIL/uL (ref 4.22–5.81)
RDW: 13.5 % (ref 11.5–15.5)
WBC: 12.4 10*3/uL — ABNORMAL HIGH (ref 4.0–10.5)
nRBC: 0 % (ref 0.0–0.2)

## 2020-02-17 LAB — C-REACTIVE PROTEIN: CRP: 8 mg/dL — ABNORMAL HIGH (ref <1.0)

## 2020-02-17 LAB — PHOSPHORUS: Phosphorus: 4.6 mg/dL (ref 2.5–4.6)

## 2020-02-17 LAB — FERRITIN: Ferritin: 2532 ng/mL — ABNORMAL HIGH (ref 24–336)

## 2020-02-17 LAB — D-DIMER, QUANTITATIVE: D-Dimer, Quant: 2.93 ug/mL-FEU — ABNORMAL HIGH (ref 0.00–0.50)

## 2020-02-17 MED ORDER — ENOXAPARIN SODIUM 40 MG/0.4ML ~~LOC~~ SOLN
40.0000 mg | Freq: Two times a day (BID) | SUBCUTANEOUS | Status: DC
  Administered 2020-02-17 – 2020-03-08 (×40): 40 mg via SUBCUTANEOUS
  Filled 2020-02-17 (×40): qty 0.4

## 2020-02-17 MED ORDER — METHYLPREDNISOLONE SODIUM SUCC 125 MG IJ SOLR
80.0000 mg | Freq: Two times a day (BID) | INTRAMUSCULAR | Status: DC
  Administered 2020-02-17 – 2020-02-24 (×14): 80 mg via INTRAVENOUS
  Filled 2020-02-17 (×14): qty 2

## 2020-02-17 NOTE — Progress Notes (Signed)
PROGRESS NOTE                                                                                                                                                                                                             Patient Demographics:    Gordon Novak, is a 85 y.o. male, DOB - 12-19-1934, BWI:[id number]  Admit date - 02/15/2020   Admitting Physician Ollen Bowl, MD  Outpatient Primary MD for the patient is [name], MD  LOS - 2   No chief complaint on file.      Brief Narrative     85 y.o. male with medical history significant of coronary artery disease status post PCI in 2020, hypertension, hyperlipidemia, type 2 diabetes mellitus, PE-not on anticoagulation, presents to emergency department for evaluation of hypoxia.  Patient tested positive for COVID-19 on 9/13.  He is fully vaccinated against COVID-19 with [employer].  He began having COVID-19 symptoms 10 days ago such as fever, cough, shortness of breath.  He was evaluated on 9/15 for a fall however at that time he was maintaining oxygen saturation on room air and he had mild symptoms at that time therefore he was discharged home.  Patient reports worsening shortness of breath therefore he called EMS and was found to have oxygen saturation 68% on room air.  He was placed on nonrebreather during transport and began having some chest pain.  EKG showed STEMI inferior leadstherefore code STEMI was called.  He received aspirin prior to arrival.  Repeat EKG shows no ST EMI.  His initial troponin came back negative.  He was started on NTG and heparin drip.  Patient denies chest pain.  Cardiology was consulted who recommended to hold off catheterization for now as patient's chest pain and EKG improved.  His symptoms were likely secondary to hypoxia.  Upon my evaluation: Patient on high flow nasal cannula, not in acute respiratory distress, communicating well, alert and oriented x3.  Reports that  he is feeling much better.  Reports some cough and shortness of breath however denies chest pain, wheezing, fever, chills, nausea, vomiting, abdominal pain, diarrhea, urinary changes.  He has chronic left lower extremity swelling since 2007.  He denies pain or redness.  He denies use of smoking, alcohol, listed drug use.  He wants to remain full code.  ED Course: Upon  arrival to ED: Patient was tachypneic, hypoxic requiring high flow nasal cannula, CBC shows leukocytosis of 11.5, troponin x1 -, PT APTT: WNL, BNP: 199, CMP shows AST of 195, ALT: 216, alkaline phosphatase: 144.  Chest x-ray shows multifocal pneumonia.  A1c: 7.1.  Patient started on heparin and NTG drip in ED.  Cardiology consulted.  [facility] hospitalist consulted for admission for acute hypoxemic respiratory failure secondary to COVID-19 pneumonia.    Subjective:    Gordon Novak today reports dyspnea, worsened by minimal activity, still reports cough, he denies any chest pain .    Assessment  & Plan :    Principal Problem:   Acute hypoxemic respiratory failure due to COVID-19 [facility]) Active Problems:   Type 2 diabetes mellitus (HCC)   Elevated liver enzymes   Essential hypertension   Acute hypoxemic respiratory failure secondary to COVID-19 pneumonia: -Patient is fully vaccinated against Covid, this is breakthrough COVID-19 infection. -Patient appears to be with severe COVID-19 for pneumonia, with clear lung parenchymal injury due to COVID-19 pneumonia, he remains with significant oxygen requirement, he is on 30 L heated high flow nasal cannula and 15 L NRB.  . - Continue with IV steroids. -Continue with IV remdesivir -Continue with baricitinib. -Continue to trend inflammatory markers specially with elevated CRP and D-dimers. -Patient is on empiric coverage for bacterial pneumonia on IV Rocephin and azithromycin given elevated procalcitonin of 2.2 on admission . -Patient encouraged to use incentive spirometry, flutter  valve. COVID-19 Labs  Recent Labs    02/15/20 1322 02/16/20 0527 02/17/20 1136  DDIMER 18.60* 5.93* 2.93*  FERRITIN 2,859* 2,329* 2,532*  LDH 488*  --   --   CRP 8.5* 11.7* 8.0*    Lab Results  Component Value Date   SARSCOV2NAA NEGATIVE 10/22/2018   SARSCOV2NAA NEGATIVE 10/18/2018     Elevated troponins -Most likely due to demand ischemia from his hypoxia. -Patient has history of coronary artery disease status post PCI. -Cardiology input greatly appreciated, this is most likely in setting of demand ischemia, GI evaluation indicated at this point. -Continue with aspirin, metoprolol and statin.   Elevated d dimer:  -in the setting of covid 19.  But as well high risk for VTE especially in the setting of bilateral PE during during previous hospitalization. -On heparin GTT, currently transitioned to full dose subcu Lovenox, venous Dopplers and CTA with no evidence of DVT or PE. -We will change Lovenox to 0.5 mg/kg every 12 hours.   Elevated liver enzymes: -To COVID-19 infection, continue to trend. -Right upper quadrant ultrasound significant for gallstone, but no evidence of acute process.  Hypertension: Stable -Continue metoprolol.  Monitor blood pressure closely.  Type 2 diabetes mellitus: A1c 7.1%.  -  Hold Metformin for now.  Start patient on sliding scale insulin and monitor blood sugar closely.  Chronic systolic CHF/ischemic cardiomyopathy: Patient appears euvolemic on exam. -Reviewed echo from 02/17/2019 which showed ejection fraction of 50 to 55%. -Continue Lasix.  Strict INO's and daily weight.  Monitor signs of fluid overload.  Check electrolytes. -Continue statin, metoprolol.  Gout: Continue allopurinol  BPH: Continue Flomax  History of PE: Was on Xarelto for 1 year.    Chronic left lower extremity swelling: Patient tells me that he has chronic left lower extremity after a kidney stone removal procedure?  Back in  2007.  Hyperlipidemia: -Continue statin   COVID-19 Labs  Recent Labs    02/15/20 1322 02/16/20 0527 02/17/20 1136  DDIMER 18.60* 5.93* 2.93*  FERRITIN 2,859* 2,329* 2,532*  LDH 488*  --   --   CRP 8.5* 11.7* 8.0*    Lab Results  Component Value Date   SARSCOV2NAA NEGATIVE 10/22/2018   SARSCOV2NAA NEGATIVE 10/18/2018     Code Status : Full  Family Communication  : daughter [name]  updated by Phone daily.  Disposition Plan  :  Status is: Inpatient  Remains inpatient appropriate because:Hemodynamically unstable, Unsafe d/c plan and IV treatments appropriate due to intensity of illness or inability to take PO   Dispo: The patient is from: Home              Anticipated d/c is to: Home              Anticipated d/c date is: > 3 days              Patient currently is not medically stable to d/c.     Consults  :  cardiology  Procedures  : none  DVT Prophylaxis  :  On Heparin GTT  Lab Results  Component Value Date   PLT 290 02/17/2020    Antibiotics  :   Anti-infectives (From admission, onward)   Start     Dose/Rate Route Frequency Ordered Stop   02/16/20 1330  azithromycin (ZITHROMAX) 500 mg in sodium chloride 0.9 % 250 mL IVPB        500 mg 250 mL/hr over 60 Minutes Intravenous Every 24 hours 02/16/20 1237 02/20/20 2359   02/16/20 1300  cefTRIAXone (ROCEPHIN) 2 g in sodium chloride 0.9 % 100 mL IVPB        2 g 200 mL/hr over 30 Minutes Intravenous Every 24 hours 02/16/20 1237 02/20/20 2359   02/16/20 1000  remdesivir 100 mg in sodium chloride 0.9 % 100 mL IVPB       "Followed by" Linked Group Details   100 mg 200 mL/hr over 30 Minutes Intravenous Daily 02/15/20 1301 02/20/20 0959   02/15/20 1400  remdesivir 200 mg in sodium chloride 0.9% 250 mL IVPB       "Followed by" Linked Group Details   200 mg 580 mL/hr over 30 Minutes Intravenous Once 02/15/20 1301 02/15/20 1504        Objective:   Vitals:   02/17/20 0337 02/17/20 0405 02/17/20 0734  02/17/20 1207  BP:  (!) 107/58 103/69 (!) 104/53  Pulse: 63 62 68 76  Resp: 16 16 17 20   Temp:  97.8 F (36.6 C) 98.3 F (36.8 C) 97.9 F (36.6 C)  TempSrc:  Axillary Oral Oral  SpO2: 95% 94% 94% 92%  Weight:      Height:        Wt Readings from Last 3 Encounters:  02/15/20 83 kg  02/09/20 83.9 kg  08/30/19 83 kg     Intake/Output Summary (Last 24 hours) at 02/17/2020 1447 Last data filed at 02/17/2020 1440 Gross per 24 hour  Intake 821.59 ml  Output 550 ml  Net 271.59 ml     Physical Exam  Awake Alert, Oriented X 3, No new F.N deficits, Normal affect Symmetrical Chest wall movement, Good air movement bilaterally, scattered rales RRR,No Gallops,Rubs or new Murmurs, No Parasternal Heave +ve B.Sounds, Abd Soft, No tenderness, No rebound - guarding or rigidity. No Cyanosis, Clubbing , chronic left lower extremity edema.     Data Review:    CBC Recent Labs  Lab 02/15/20 1022 02/15/20 1322 02/16/20 0527 02/17/20 1136  WBC 11.5* 11.0* 10.5 12.4*  HGB 15.8 15.1 14.0 15.4  HCT  47.4 44.7 41.7 45.7  PLT 250 238 245 290  MCV 93.5 92.7 92.1 92.7  MCH 31.2 31.3 30.9 31.2  MCHC 33.3 33.8 33.6 33.7  RDW 13.3 13.2 13.3 13.5  LYMPHSABS 1.1 0.6* 0.9 0.8  MONOABS 0.6 0.3 0.4 0.4  EOSABS 0.0 0.0 0.0 0.0  BASOSABS 0.0 0.0 0.0 0.0    Chemistries  Recent Labs  Lab 02/15/20 1022 02/15/20 1322 02/16/20 0527 02/17/20 1136  NA 136 137 140 141  K 4.4 4.6 4.6 4.1  CL 102 105 107 110  CO2 21* 21* 22 18*  GLUCOSE 159* 194* 162* 252*  BUN 22 22 26* 33*  CREATININE 0.99 0.97 0.96 1.07  CALCIUM 8.4* 8.3* 8.0* 8.2*  MG  --   --  2.3 2.5*  AST 195* 157* 82* 63*  ALT 216* 189* 134* 114*  ALKPHOS 144* 133* 110 107  BILITOT 1.1 1.1 1.1 0.8   ------------------------------------------------------------------------------------------------------------------ Recent Labs    02/15/20 1022  CHOL 76  HDL 39*  LDLCALC 17  TRIG 161  CHOLHDL 1.9    Lab Results  Component  Value Date   HGBA1C 7.1 (H) 02/15/2020   ------------------------------------------------------------------------------------------------------------------ No results for input(s): TSH, T4TOTAL, T3FREE, THYROIDAB in the last 72 hours.  Invalid input(s): FREET3 ------------------------------------------------------------------------------------------------------------------ Recent Labs    02/16/20 0527 02/17/20 1136  FERRITIN 2,329* 2,532*    Coagulation profile Recent Labs  Lab 02/15/20 1022  INR 1.2    Recent Labs    02/16/20 0527 02/17/20 1136  DDIMER 5.93* 2.93*    Cardiac Enzymes No results for input(s): CKMB, TROPONINI, MYOGLOBIN in the last 168 hours.  Invalid input(s): CK ------------------------------------------------------------------------------------------------------------------    Component Value Date/Time   BNP 199.1 (H) 02/15/2020 1040    Inpatient Medications  Scheduled Meds: . allopurinol  300 mg Oral Daily  . vitamin C  500 mg Oral Daily  . aspirin EC  81 mg Oral Daily  . baricitinib  4 mg Oral Daily  . enoxaparin (LOVENOX) injection  80 mg Subcutaneous BID  . furosemide  20 mg Oral Daily  . insulin aspart  0-15 Units Subcutaneous TID WC  . insulin aspart  0-5 Units Subcutaneous QHS  . methylPREDNISolone (SOLU-MEDROL) injection  85 mg Intravenous Q12H   Followed by  . [START ON 02/18/2020] predniSONE  50 mg Oral Daily  . metoprolol succinate  12.5 mg Oral QHS  . pantoprazole  40 mg Oral Daily  . rosuvastatin  10 mg Oral Daily  . tamsulosin  0.4 mg Oral QHS  . zinc sulfate  220 mg Oral Daily   Continuous Infusions: . sodium chloride 10 mL/hr at 02/17/20 0500  . azithromycin 500 mg (02/16/20 1612)  . cefTRIAXone (ROCEPHIN)  IV Stopped (02/16/20 1610)  . remdesivir 100 mg in NS 100 mL 100 mg (02/17/20 0843)   PRN Meds:.acetaminophen, chlorpheniramine-HYDROcodone, guaiFENesin-dextromethorphan, ondansetron **OR** ondansetron (ZOFRAN)  IV  Micro Results Recent Results (from the past 240 hour(s))  Culture, blood (Routine X 2) w Reflex to ID Panel     Status: None (Preliminary result)   Collection Time: 02/15/20  1:24 PM   Specimen: BLOOD  Result Value Ref Range Status   Specimen Description BLOOD RIGHT ANTECUBITAL  Final   Special Requests   Final    BOTTLES DRAWN AEROBIC AND ANAEROBIC Blood Culture adequate volume   Culture   Final    NO GROWTH 2 DAYS Performed at [facility], [address], [place], [place] [address]    Report  Status PENDING  Incomplete  Culture, blood (Routine X 2) w Reflex to ID Panel     Status: None (Preliminary result)   Collection Time: 02/15/20  1:24 PM   Specimen: BLOOD  Result Value Ref Range Status   Specimen Description BLOOD BLOOD LEFT HAND  Final   Special Requests   Final    BOTTLES DRAWN AEROBIC AND ANAEROBIC Blood Culture adequate volume   Culture   Final    NO GROWTH 2 DAYS Performed at [facility], [address] [place], [place] [address]    Report Status PENDING  Incomplete    Radiology Reports CT Head Wo Contrast  Result Date: 02/09/2020 CLINICAL DATA:  Trauma, fall on blood thinners EXAM: CT HEAD WITHOUT CONTRAST CT CERVICAL SPINE WITHOUT CONTRAST TECHNIQUE: Multidetector CT imaging of the head and cervical spine was performed following the standard protocol without intravenous contrast. Multiplanar CT image reconstructions of the cervical spine were also generated. COMPARISON:  None. FINDINGS: CT HEAD FINDINGS Brain: No acute territorial infarction, hemorrhage, or intracranial mass. Moderate atrophy. Mild hypodensity in the white matter consistent with chronic small vessel ischemic change. The ventricles are nonenlarged. Vascular: No hyperdense vessels.  Carotid vascular calcification. Skull: No depressed skull fracture. Sinuses/Orbits: Mucosal thickening in the maxillary and ethmoid sinuses. Small fluid level in the left sphenoid sinus but no central skull  base lucency is seen. Possible chronic left nasal bone fracture. Other: Mild right posterior scalp swelling. CT CERVICAL SPINE FINDINGS Alignment: No subluxation.  Facet alignment within normal limits. Skull base and vertebrae: No acute fracture. No primary bone lesion or focal pathologic process. Soft tissues and spinal canal: No prevertebral fluid or swelling. No visible canal hematoma. Disc levels: Multiple level degenerative change with moderate severe disease at C5-C6 and C6-C7. Facet degenerative changes at multiple levels with foraminal narrowing. Upper chest: Apical scarring Other: None IMPRESSION: 1. No CT evidence for acute intracranial abnormality. Atrophy and mild chronic small vessel ischemic changes of the white matter. 2. Degenerative changes of the cervical spine. No acute osseous abnormality. Electronically Signed   By: Jasmine PangKim  Fujinaga M.D.   On: 02/09/2020 01:13   CT ANGIO CHEST PE W OR WO CONTRAST  Result Date: 02/16/2020 CLINICAL DATA:  COVID positive peer EXAM: CT ANGIOGRAPHY CHEST WITH CONTRAST TECHNIQUE: Multidetector CT imaging of the chest was performed using the standard protocol during bolus administration of intravenous contrast. Multiplanar CT image reconstructions and MIPs were obtained to evaluate the vascular anatomy. CONTRAST:  100mL OMNIPAQUE IOHEXOL 350 MG/ML SOLN COMPARISON:  Oct 22, 2018 FINDINGS: Cardiovascular: There is moderate severity calcification of the thoracic aorta. Satisfactory opacification of the pulmonary arteries to the segmental level. No evidence of pulmonary embolism. Normal heart size. No pericardial effusion. Mediastinum/Nodes: No enlarged mediastinal, hilar, or axillary lymph nodes. Thyroid gland, trachea, and esophagus demonstrate no significant findings. Lungs/Pleura: Marked severity patchy and ground-glass appearing infiltrates are seen throughout both lungs. There is no evidence of a pleural effusion or pneumothorax. Upper Abdomen: Numerous  subcentimeter gallstones are seen within the lumen of an otherwise normal-appearing gallbladder. Musculoskeletal: No chest wall abnormality. No acute or significant osseous findings. Review of the MIP images confirms the above findings. IMPRESSION: 1. Marked severity bilateral patchy and ground-glass appearing infiltrates. 2. Cholelithiasis. 3. Aortic atherosclerosis. Aortic Atherosclerosis (ICD10-I70.0). Electronically Signed   By: Aram Candelahaddeus  Houston M.D.   On: 02/16/2020 22:13   CT Cervical Spine Wo Contrast  Result Date: 02/09/2020 CLINICAL DATA:  Trauma, fall on blood thinners EXAM:  CT HEAD WITHOUT CONTRAST CT CERVICAL SPINE WITHOUT CONTRAST TECHNIQUE: Multidetector CT imaging of the head and cervical spine was performed following the standard protocol without intravenous contrast. Multiplanar CT image reconstructions of the cervical spine were also generated. COMPARISON:  None. FINDINGS: CT HEAD FINDINGS Brain: No acute territorial infarction, hemorrhage, or intracranial mass. Moderate atrophy. Mild hypodensity in the white matter consistent with chronic small vessel ischemic change. The ventricles are nonenlarged. Vascular: No hyperdense vessels.  Carotid vascular calcification. Skull: No depressed skull fracture. Sinuses/Orbits: Mucosal thickening in the maxillary and ethmoid sinuses. Small fluid level in the left sphenoid sinus but no central skull base lucency is seen. Possible chronic left nasal bone fracture. Other: Mild right posterior scalp swelling. CT CERVICAL SPINE FINDINGS Alignment: No subluxation.  Facet alignment within normal limits. Skull base and vertebrae: No acute fracture. No primary bone lesion or focal pathologic process. Soft tissues and spinal canal: No prevertebral fluid or swelling. No visible canal hematoma. Disc levels: Multiple level degenerative change with moderate severe disease at C5-C6 and C6-C7. Facet degenerative changes at multiple levels with foraminal narrowing. Upper  chest: Apical scarring Other: None IMPRESSION: 1. No CT evidence for acute intracranial abnormality. Atrophy and mild chronic small vessel ischemic changes of the white matter. 2. Degenerative changes of the cervical spine. No acute osseous abnormality. Electronically Signed   By: Jasmine Pang M.D.   On: 02/09/2020 01:13   DG Chest Port 1 View  Result Date: 02/15/2020 CLINICAL DATA:  COVID positive.  Hypoxia. EXAM: PORTABLE CHEST 1 VIEW COMPARISON:  Oct 22, 2018 FINDINGS: Diffuse bilateral interstitial and airspace opacities, new from prior. No visible pleural effusion. No pneumothorax. Cardiac silhouette is accentuated by low lung volumes and portable technique. Aortic atherosclerosis. IMPRESSION: Diffuse bilateral interstitial and airspace opacities, likely multifocal pneumonia given the clinical history. Electronically Signed   By: Feliberto Harts MD   On: 02/15/2020 10:56   VAS [place] LOWER EXTREMITY VENOUS (DVT)  Result Date: 02/16/2020  Lower Venous DVTStudy Indications: Swelling, and elevated d dimer.  Risk Factors: + covid. Performing Technologist: Jannet Askew RCT RDMS  Examination Guidelines: A complete evaluation includes B-mode imaging, spectral Doppler, color Doppler, and power Doppler as needed of all accessible portions of each vessel. Bilateral testing is considered an integral part of a complete examination. Limited examinations for reoccurring indications may be performed as noted. The reflux portion of the exam is performed with the patient in reverse Trendelenburg.  +---------+---------------+---------+-----------+----------+--------------+ RIGHT    CompressibilityPhasicitySpontaneityPropertiesThrombus Aging +---------+---------------+---------+-----------+----------+--------------+ CFV      Full           Yes      Yes                                 +---------+---------------+---------+-----------+----------+--------------+ SFJ      Full                                                         +---------+---------------+---------+-----------+----------+--------------+ FV Prox  Full                                                        +---------+---------------+---------+-----------+----------+--------------+  FV Mid   Full                                                        +---------+---------------+---------+-----------+----------+--------------+ FV DistalFull                                                        +---------+---------------+---------+-----------+----------+--------------+ PFV      Full                                                        +---------+---------------+---------+-----------+----------+--------------+ POP      Full           Yes      Yes                                 +---------+---------------+---------+-----------+----------+--------------+ PTV      Full                                                        +---------+---------------+---------+-----------+----------+--------------+ PERO     Full                                                        +---------+---------------+---------+-----------+----------+--------------+   +---------+---------------+---------+-----------+----------+--------------+ LEFT     CompressibilityPhasicitySpontaneityPropertiesThrombus Aging +---------+---------------+---------+-----------+----------+--------------+ CFV      Full           Yes      Yes                                 +---------+---------------+---------+-----------+----------+--------------+ SFJ      Full                                                        +---------+---------------+---------+-----------+----------+--------------+ FV Prox  Full                                                        +---------+---------------+---------+-----------+----------+--------------+ FV Mid   Full                                                         +---------+---------------+---------+-----------+----------+--------------+  FV DistalFull                                                        +---------+---------------+---------+-----------+----------+--------------+ PFV      Full                                                        +---------+---------------+---------+-----------+----------+--------------+ POP      Full           Yes      Yes                                 +---------+---------------+---------+-----------+----------+--------------+ PTV      Full                                                        +---------+---------------+---------+-----------+----------+--------------+ PERO     Full                                                        +---------+---------------+---------+-----------+----------+--------------+     Summary: RIGHT: - There is no evidence of deep vein thrombosis in the lower extremity.  - No cystic structure found in the popliteal fossa.  LEFT: - There is no evidence of deep vein thrombosis in the lower extremity.  - No cystic structure found in the popliteal fossa.  *See table(s) above for measurements and observations. Electronically signed by Coral Else MD on 02/16/2020 at 9:33:24 PM.    Final    ECHOCARDIOGRAM LIMITED  Result Date: 02/16/2020    ECHOCARDIOGRAM LIMITED REPORT   Patient Name:   KAVONTAE PRITCHARD Date of Exam: 02/16/2020 Medical Rec #:  [id number]       Height:       72.0 in Accession #:    [id number]      Weight:       183.0 lb Date of Birth:  01-24-1935       BSA:          2.052 m Patient Age:    84 years        BP:           114/53 mmHg Patient Gender: M               HR:           77 bpm. Exam Location:  Inpatient Procedure: Limited Echo, Cardiac Doppler and Color Doppler Indications:    Elevated Troponin  History:        Patient has prior history of Echocardiogram examinations, most                 recent 02/17/2019. CHF and Cardiomyopathy, CAD, Arrythmias:Atrial  Fibrillation, Signs/Symptoms:Shortness of Breath and Fever; Risk                 Factors:Hypertension, Dyslipidemia, OBESITY and Diabetes.                 COVID+.  Sonographer:    Lavenia Atlas Referring Phys: [id number] Kasandra Knudsen PAHWANI IMPRESSIONS  1. Left ventricular ejection fraction, by estimation, is 55 to 60%.  2. Right ventricular systolic function is normal. The right ventricular size is normal. There is normal pulmonary artery systolic pressure.  3. The interatrial septum is aneuyrsmal with frequent bowing from right to left.  4. The mitral valve is normal in structure. Trivial mitral valve regurgitation.  5. There is mild calcification of the aortic valve. There is mild thickening of the aortic valve. Mild aortic valve sclerosis is present, with no evidence of aortic valve stenosis. Comparison(s): Compared to prior echo on 01/2019, the LVEF appears mildly improved to 55-60%. FINDINGS  Left Ventricle: Septal motion is abnormal consistent with prior surgery. Left ventricular ejection fraction, by estimation, is 55 to 60%. The left ventricular internal cavity size was normal in size. There is no left ventricular hypertrophy. Right Ventricle: The right ventricular size is normal. Right ventricular systolic function is normal. There is normal pulmonary artery systolic pressure. The tricuspid regurgitant velocity is 2.41 m/s, and with an assumed right atrial pressure of 3 mmHg,  the estimated right ventricular systolic pressure is 26.2 mmHg. Left Atrium: Left atrial size was normal in size. Pericardium: There is no evidence of pericardial effusion. Mitral Valve: The mitral valve is normal in structure. There is mild thickening of the mitral valve leaflet(s). Mild mitral annular calcification. Trivial mitral valve regurgitation. Tricuspid Valve: The tricuspid valve is normal in structure. Tricuspid valve regurgitation is mild. Aortic Valve: There is mild calcification of the aortic valve. There is  mild thickening of the aortic valve. Mild aortic valve sclerosis is present, with no evidence of aortic valve stenosis. Pulmonic Valve: The pulmonic valve was not assessed. Aorta: The aortic root is normal in size and structure. LEFT VENTRICLE PLAX 2D LVIDd:         3.80 cm  Diastology LVIDs:         2.70 cm  LV e' medial:    7.51 cm/s LV PW:         1.20 cm  LV E/e' medial:  7.7 LV IVS:        0.90 cm  LV e' lateral:   5.98 cm/s LVOT diam:     1.90 cm  LV E/e' lateral: 9.7 LVOT Area:     2.84 cm  RIGHT VENTRICLE RV S prime:     13.30 cm/s LEFT ATRIUM         Index LA diam:    2.80 cm 1.36 cm/m   AORTA Ao Root diam: 2.50 cm MITRAL VALVE               TRICUSPID VALVE MV Area (PHT): 5.38 cm    TR Peak grad:   23.2 mmHg MV Decel Time: 141 msec    TR Vmax:        241.00 cm/s MV E velocity: 57.80 cm/s MV A velocity: 70.30 cm/s  SHUNTS MV E/A ratio:  0.82        Systemic Diam: 1.90 cm Laurance Flatten MD Electronically signed by Laurance Flatten MD Signature Date/Time: 02/16/2020/4:23:30 PM    Final    ABORTED INVASIVE LAB PROCEDURE  Result Date: 02/15/2020  This case was aborted.  US Abdomen Limited RUQ  Result Date: 02/15/2020 CLINICAL DATA:  Elevated liver enzymes. EXAM: ULTRASOUND ABDOMEN LIMITED RIGHT UPPER QUADRANT COMPARISON:  CT abdomen 07/06/2019. FINDINGS: Gallbladder: Gallstones measuring up to 7 mm noted. Gallbladder wall thickness normal. No Murphy sign. Common bile duct: Diameter: Not visualized due to overlying bowel gas. Liver: Left no not visualized due to overlying bowel gas. No focal lesion identified. Within normal limits in parenchymal echogenicity. Portal vein is patent on color Doppler imaging with normal direction of blood flow towards the liver. Other: Limited study due to overlying bowel gas. IMPRESSION: 1.  Multiple small gallstones.  No gallbladder wall thickening. 2. Limited exam due to overlying bowel gas. Common bile duct not visualized. Portions of the liver not visualized.  Electronically Signed   By: Maisie Fus  Register   On: 02/15/2020 15:35     Huey Bienenstock M.D on 02/17/2020 at 2:47 PM    [facility] -  Office  [phone]

## 2020-02-18 LAB — COMPREHENSIVE METABOLIC PANEL
ALT: 119 U/L — ABNORMAL HIGH (ref 0–44)
AST: 82 U/L — ABNORMAL HIGH (ref 15–41)
Albumin: 1.8 g/dL — ABNORMAL LOW (ref 3.5–5.0)
Alkaline Phosphatase: 96 U/L (ref 38–126)
Anion gap: 11 (ref 5–15)
BUN: 34 mg/dL — ABNORMAL HIGH (ref 8–23)
CO2: 22 mmol/L (ref 22–32)
Calcium: 8 mg/dL — ABNORMAL LOW (ref 8.9–10.3)
Chloride: 108 mmol/L (ref 98–111)
Creatinine, Ser: 0.96 mg/dL (ref 0.61–1.24)
GFR calc Af Amer: >60 mL/min (ref >60)
GFR calc non Af Amer: >60 mL/min (ref >60)
Glucose, Bld: 214 mg/dL — ABNORMAL HIGH (ref 70–99)
Potassium: 4.4 mmol/L (ref 3.5–5.1)
Sodium: 141 mmol/L (ref 135–145)
Total Bilirubin: 0.6 mg/dL (ref 0.3–1.2)
Total Protein: 5 g/dL — ABNORMAL LOW (ref 6.5–8.1)

## 2020-02-18 LAB — CBC WITH DIFFERENTIAL/PLATELET
Abs Immature Granulocytes: 0.1 10*3/uL — ABNORMAL HIGH (ref 0.00–0.07)
Basophils Absolute: 0 10*3/uL (ref 0.0–0.1)
Basophils Relative: 0 %
Eosinophils Absolute: 0 10*3/uL (ref 0.0–0.5)
Eosinophils Relative: 0 %
HCT: 39.9 % (ref 39.0–52.0)
Hemoglobin: 13.6 g/dL (ref 13.0–17.0)
Immature Granulocytes: 1 %
Lymphocytes Relative: 5 %
Lymphs Abs: 0.6 10*3/uL — ABNORMAL LOW (ref 0.7–4.0)
MCH: 31.6 pg (ref 26.0–34.0)
MCHC: 34.1 g/dL (ref 30.0–36.0)
MCV: 92.6 fL (ref 80.0–100.0)
Monocytes Absolute: 0.3 10*3/uL (ref 0.1–1.0)
Monocytes Relative: 3 %
Neutro Abs: 10.1 10*3/uL — ABNORMAL HIGH (ref 1.7–7.7)
Neutrophils Relative %: 91 %
Platelets: 275 10*3/uL (ref 150–400)
RBC: 4.31 MIL/uL (ref 4.22–5.81)
RDW: 13.4 % (ref 11.5–15.5)
WBC: 11.2 10*3/uL — ABNORMAL HIGH (ref 4.0–10.5)
nRBC: 0 % (ref 0.0–0.2)

## 2020-02-18 LAB — PROCALCITONIN: Procalcitonin: 0.47 ng/mL

## 2020-02-18 LAB — MAGNESIUM: Magnesium: 2.7 mg/dL — ABNORMAL HIGH (ref 1.7–2.4)

## 2020-02-18 LAB — FERRITIN: Ferritin: 3671 ng/mL — ABNORMAL HIGH (ref 24–336)

## 2020-02-18 LAB — GLUCOSE, CAPILLARY
Glucose-Capillary: 180 mg/dL — ABNORMAL HIGH (ref 70–99)
Glucose-Capillary: 182 mg/dL — ABNORMAL HIGH (ref 70–99)

## 2020-02-18 LAB — D-DIMER, QUANTITATIVE: D-Dimer, Quant: 1.97 ug/mL-FEU — ABNORMAL HIGH (ref 0.00–0.50)

## 2020-02-18 LAB — C-REACTIVE PROTEIN: CRP: 4.8 mg/dL — ABNORMAL HIGH (ref <1.0)

## 2020-02-18 LAB — PHOSPHORUS: Phosphorus: 4.2 mg/dL (ref 2.5–4.6)

## 2020-02-18 MED ORDER — FUROSEMIDE 10 MG/ML IJ SOLN
20.0000 mg | Freq: Once | INTRAMUSCULAR | Status: AC
  Administered 2020-02-18: 20 mg via INTRAVENOUS
  Filled 2020-02-18: qty 2

## 2020-02-18 NOTE — Progress Notes (Signed)
PROGRESS NOTE                                                                                                                                                                                                             Patient Demographics:    Gordon Novak, is a 85 y.o. male, DOB - 1934/11/11, ZOX:[id number]  Admit date - 02/15/2020   Admitting Physician Ollen Bowl, MD  Outpatient Primary MD for the patient is [name], MD  LOS - 3   No chief complaint on file.      Brief Narrative     85 y.o. male with medical history significant of coronary artery disease status post PCI in 2020, hypertension, hyperlipidemia, type 2 diabetes mellitus, PE-not on anticoagulation, presents to emergency department for evaluation of hypoxia.  Patient tested positive for COVID-19 on 9/13.  He is fully vaccinated against COVID-19 with [employer].  He began having COVID-19 symptoms 10 days ago such as fever, cough, shortness of breath.  He was evaluated on 9/15 for a fall however at that time he was maintaining oxygen saturation on room air and he had mild symptoms at that time therefore he was discharged home.  Patient reports worsening shortness of breath therefore he called EMS and was found to have oxygen saturation 68% on room air.  He was placed on nonrebreather during transport and began having some chest pain.  EKG showed STEMI inferior leadstherefore code STEMI was called.  He received aspirin prior to arrival.  Repeat EKG shows no ST EMI.  His initial troponin came back negative.  He was started on NTG and heparin drip.  Patient denies chest pain.  Cardiology was consulted who recommended to hold off catheterization for now as patient's chest pain and EKG improved.  His symptoms were likely secondary to hypoxia.  Upon my evaluation: Patient on high flow nasal cannula, not in acute respiratory distress, communicating well, alert and oriented x3.  Reports that  he is feeling much better.  Reports some cough and shortness of breath however denies chest pain, wheezing, fever, chills, nausea, vomiting, abdominal pain, diarrhea, urinary changes.  He has chronic left lower extremity swelling since 2007.  He denies pain or redness.  He denies use of smoking, alcohol, listed drug use.  He wants to remain full code.  ED Course: Upon  arrival to ED: Patient was tachypneic, hypoxic requiring high flow nasal cannula, CBC shows leukocytosis of 11.5, troponin x1 -, PT APTT: WNL, BNP: 199, CMP shows AST of 195, ALT: 216, alkaline phosphatase: 144.  Chest x-ray shows multifocal pneumonia.  A1c: 7.1.  Patient started on heparin and NTG drip in ED.  Cardiology consulted.  [facility] hospitalist consulted for admission for acute hypoxemic respiratory failure secondary to COVID-19 pneumonia.    Subjective:    Gordon Novak today reports good night sleep, still reports dyspnea, cough, he denies any chest pain.     Assessment  & Plan :    Principal Problem:   Acute hypoxemic respiratory failure due to COVID-19 [facility]) Active Problems:   Type 2 diabetes mellitus (HCC)   Elevated liver enzymes   Essential hypertension   Acute hypoxemic respiratory failure secondary to COVID-19 pneumonia: -Patient is fully vaccinated against Covid, this is breakthrough COVID-19 infection. -Patient appears to be with severe COVID-19 for pneumonia, with clear lung parenchymal injury due to COVID-19 pneumonia, he remains with significant oxygen requirement, some mild improvement this morning, he is on 25 L heated high flow on top of 15 L nonrebreather.   - Continue with IV steroids. -Continue with IV remdesivir -Continue with baricitinib. -Continue to trend inflammatory markers specially with elevated CRP and D-dimers. -Patient is on empiric coverage for bacterial pneumonia on IV Rocephin and azithromycin given elevated procalcitonin of 2.2 on admission . -Patient encouraged to use incentive  spirometry, flutter valve. -We will give 1 dose of 20 mg IV Lasix today. COVID-19 Labs  Recent Labs    02/16/20 0527 02/17/20 1136 02/18/20 0358  DDIMER 5.93* 2.93* 1.97*  FERRITIN 2,329* 2,532* 3,671*  CRP 11.7* 8.0* 4.8*    Lab Results  Component Value Date   SARSCOV2NAA NEGATIVE 10/22/2018   SARSCOV2NAA NEGATIVE 10/18/2018     Elevated troponins -Most likely due to demand ischemia from his hypoxia. -Patient has history of coronary artery disease status post PCI. -Cardiology input greatly appreciated, this is most likely in setting of demand ischemia, GI evaluation indicated at this point. -Continue with aspirin, metoprolol and statin.   Elevated d dimer:  -in the setting of covid 19.  But as well high risk for VTE especially in the setting of bilateral PE during during previous hospitalization. -Initially on heparin GTT, currently transitioned to full dose subcu Lovenox, venous Dopplers and CTA with no evidence of DVT or PE.  His Lovenox dose was lowered to 0.5 mg/kg every 12 hours.  Elevated liver enzymes: -To COVID-19 infection, continue to trend. -Right upper quadrant ultrasound significant for gallstone, but no evidence of acute process.  Hypertension: Stable -Continue metoprolol.  Monitor blood pressure closely.  Type 2 diabetes mellitus: A1c 7.1%.  -  Hold Metformin for now.  Start patient on sliding scale insulin and monitor blood sugar closely.  Chronic systolic CHF/ischemic cardiomyopathy: Patient appears euvolemic on exam. -Reviewed echo from 02/17/2019 which showed ejection fraction of 50 to 55%. -Continue Lasix.  Strict INO's and daily weight.  Monitor signs of fluid overload.  Check electrolytes. -Continue statin, metoprolol.  Gout: Continue allopurinol  BPH: Continue Flomax  History of PE: Was on Xarelto for 1 year.    Chronic left lower extremity swelling: Patient tells me that he has chronic left lower extremity after a kidney stone  removal procedure?  Back in 2007.  Hyperlipidemia: -Continue statin   COVID-19 Labs  Recent Labs    02/16/20 0527 02/17/20 1136 02/18/20 0358  DDIMER  5.93* 2.93* 1.97*  FERRITIN 2,329* 2,532* 3,671*  CRP 11.7* 8.0* 4.8*    Lab Results  Component Value Date   SARSCOV2NAA NEGATIVE 10/22/2018   SARSCOV2NAA NEGATIVE 10/18/2018     Code Status : Full  Family Communication  : daughter [name]  updated by Phone daily.  Disposition Plan  :  Status is: Inpatient  Remains inpatient appropriate because:Hemodynamically unstable, Unsafe d/c plan and IV treatments appropriate due to intensity of illness or inability to take PO   Dispo: The patient is from: Home              Anticipated d/c is to: Home              Anticipated d/c date is: > 3 days              Patient currently is not medically stable to d/c.     Consults  :  cardiology  Procedures  : none  DVT Prophylaxis  :  On Heparin GTT  Lab Results  Component Value Date   PLT 275 02/18/2020    Antibiotics  :   Anti-infectives (From admission, onward)   Start     Dose/Rate Route Frequency Ordered Stop   02/16/20 1330  azithromycin (ZITHROMAX) 500 mg in sodium chloride 0.9 % 250 mL IVPB        500 mg 250 mL/hr over 60 Minutes Intravenous Every 24 hours 02/16/20 1237 02/20/20 2359   02/16/20 1300  cefTRIAXone (ROCEPHIN) 2 g in sodium chloride 0.9 % 100 mL IVPB        2 g 200 mL/hr over 30 Minutes Intravenous Every 24 hours 02/16/20 1237 02/20/20 2359   02/16/20 1000  remdesivir 100 mg in sodium chloride 0.9 % 100 mL IVPB       "Followed by" Linked Group Details   100 mg 200 mL/hr over 30 Minutes Intravenous Daily 02/15/20 1301 02/20/20 0959   02/15/20 1400  remdesivir 200 mg in sodium chloride 0.9% 250 mL IVPB       "Followed by" Linked Group Details   200 mg 580 mL/hr over 30 Minutes Intravenous Once 02/15/20 1301 02/15/20 1504        Objective:   Vitals:   02/18/20 0757 02/18/20 0910 02/18/20 1142  02/18/20 1319  BP: 123/61  114/62   Pulse: 85 78 75 86  Resp: (!) 24 17 20 20   Temp: 98 F (36.7 C)  98.2 F (36.8 C)   TempSrc: Oral  Oral   SpO2: 90% 93% 90% 90%  Weight:      Height:        Wt Readings from Last 3 Encounters:  02/15/20 83 kg  02/09/20 83.9 kg  08/30/19 83 kg     Intake/Output Summary (Last 24 hours) at 02/18/2020 1556 Last data filed at 02/18/2020 1142 Gross per 24 hour  Intake 120 ml  Output 400 ml  Net -280 ml     Physical Exam  Awake Alert, Oriented X 3, No new F.N deficits, Normal affect Symmetrical Chest wall movement, Good air movement bilaterally, CTAB RRR,No Gallops,Rubs or new Murmurs, No Parasternal Heave +ve B.Sounds, Abd Soft, No tenderness, No rebound - guarding or rigidity. No Cyanosis, Clubbing , chronic left lower extremity edema.      Data Review:    CBC Recent Labs  Lab 02/15/20 1022 02/15/20 1322 02/16/20 0527 02/17/20 1136 02/18/20 0358  WBC 11.5* 11.0* 10.5 12.4* 11.2*  HGB 15.8 15.1 14.0 15.4 13.6  HCT  47.4 44.7 41.7 45.7 39.9  PLT 250 238 245 290 275  MCV 93.5 92.7 92.1 92.7 92.6  MCH 31.2 31.3 30.9 31.2 31.6  MCHC 33.3 33.8 33.6 33.7 34.1  RDW 13.3 13.2 13.3 13.5 13.4  LYMPHSABS 1.1 0.6* 0.9 0.8 0.6*  MONOABS 0.6 0.3 0.4 0.4 0.3  EOSABS 0.0 0.0 0.0 0.0 0.0  BASOSABS 0.0 0.0 0.0 0.0 0.0    Chemistries  Recent Labs  Lab 02/15/20 1022 02/15/20 1322 02/16/20 0527 02/17/20 1136 02/18/20 0358  NA 136 137 140 141 141  K 4.4 4.6 4.6 4.1 4.4  CL 102 105 107 110 108  CO2 21* 21* 22 18* 22  GLUCOSE 159* 194* 162* 252* 214*  BUN 22 22 26* 33* 34*  CREATININE 0.99 0.97 0.96 1.07 0.96  CALCIUM 8.4* 8.3* 8.0* 8.2* 8.0*  MG  --   --  2.3 2.5* 2.7*  AST 195* 157* 82* 63* 82*  ALT 216* 189* 134* 114* 119*  ALKPHOS 144* 133* 110 107 96  BILITOT 1.1 1.1 1.1 0.8 0.6   ------------------------------------------------------------------------------------------------------------------ No results for input(s):  CHOL, HDL, LDLCALC, TRIG, CHOLHDL, LDLDIRECT in the last 72 hours.  Lab Results  Component Value Date   HGBA1C 7.1 (H) 02/15/2020   ------------------------------------------------------------------------------------------------------------------ No results for input(s): TSH, T4TOTAL, T3FREE, THYROIDAB in the last 72 hours.  Invalid input(s): FREET3 ------------------------------------------------------------------------------------------------------------------ Recent Labs    02/17/20 1136 02/18/20 0358  FERRITIN 2,532* 3,671*    Coagulation profile Recent Labs  Lab 02/15/20 1022  INR 1.2    Recent Labs    02/17/20 1136 02/18/20 0358  DDIMER 2.93* 1.97*    Cardiac Enzymes No results for input(s): CKMB, TROPONINI, MYOGLOBIN in the last 168 hours.  Invalid input(s): CK ------------------------------------------------------------------------------------------------------------------    Component Value Date/Time   BNP 199.1 (H) 02/15/2020 1040    Inpatient Medications  Scheduled Meds: . allopurinol  300 mg Oral Daily  . vitamin C  500 mg Oral Daily  . aspirin EC  81 mg Oral Daily  . baricitinib  4 mg Oral Daily  . enoxaparin (LOVENOX) injection  40 mg Subcutaneous BID  . furosemide  20 mg Oral Daily  . insulin aspart  0-15 Units Subcutaneous TID WC  . insulin aspart  0-5 Units Subcutaneous QHS  . methylPREDNISolone (SOLU-MEDROL) injection  80 mg Intravenous Q12H  . metoprolol succinate  12.5 mg Oral QHS  . pantoprazole  40 mg Oral Daily  . rosuvastatin  10 mg Oral Daily  . tamsulosin  0.4 mg Oral QHS  . zinc sulfate  220 mg Oral Daily   Continuous Infusions: . sodium chloride 10 mL/hr at 02/17/20 0500  . azithromycin 500 mg (02/18/20 1349)  . cefTRIAXone (ROCEPHIN)  IV 2 g (02/18/20 1350)  . remdesivir 100 mg in NS 100 mL 100 mg (02/18/20 0851)   PRN Meds:.acetaminophen, chlorpheniramine-HYDROcodone, guaiFENesin-dextromethorphan, ondansetron **OR**  ondansetron (ZOFRAN) IV  Micro Results Recent Results (from the past 240 hour(s))  Culture, blood (Routine X 2) w Reflex to ID Panel     Status: None (Preliminary result)   Collection Time: 02/15/20  1:24 PM   Specimen: BLOOD  Result Value Ref Range Status   Specimen Description BLOOD RIGHT ANTECUBITAL  Final   Special Requests   Final    BOTTLES DRAWN AEROBIC AND ANAEROBIC Blood Culture adequate volume   Culture   Final    NO GROWTH 2 DAYS Performed at [facility], [address], [place], [place] [address]  Report Status PENDING  Incomplete  Culture, blood (Routine X 2) w Reflex to ID Panel     Status: None (Preliminary result)   Collection Time: 02/15/20  1:24 PM   Specimen: BLOOD  Result Value Ref Range Status   Specimen Description BLOOD BLOOD LEFT HAND  Final   Special Requests   Final    BOTTLES DRAWN AEROBIC AND ANAEROBIC Blood Culture adequate volume   Culture   Final    NO GROWTH 2 DAYS Performed at [facility], [address], [place], [place] [address]    Report Status PENDING  Incomplete    Radiology Reports CT Head Wo Contrast  Result Date: 02/09/2020 CLINICAL DATA:  Trauma, fall on blood thinners EXAM: CT HEAD WITHOUT CONTRAST CT CERVICAL SPINE WITHOUT CONTRAST TECHNIQUE: Multidetector CT imaging of the head and cervical spine was performed following the standard protocol without intravenous contrast. Multiplanar CT image reconstructions of the cervical spine were also generated. COMPARISON:  None. FINDINGS: CT HEAD FINDINGS Brain: No acute territorial infarction, hemorrhage, or intracranial mass. Moderate atrophy. Mild hypodensity in the white matter consistent with chronic small vessel ischemic change. The ventricles are nonenlarged. Vascular: No hyperdense vessels.  Carotid vascular calcification. Skull: No depressed skull fracture. Sinuses/Orbits: Mucosal thickening in the maxillary and ethmoid sinuses. Small fluid level in the left sphenoid sinus  but no central skull base lucency is seen. Possible chronic left nasal bone fracture. Other: Mild right posterior scalp swelling. CT CERVICAL SPINE FINDINGS Alignment: No subluxation.  Facet alignment within normal limits. Skull base and vertebrae: No acute fracture. No primary bone lesion or focal pathologic process. Soft tissues and spinal canal: No prevertebral fluid or swelling. No visible canal hematoma. Disc levels: Multiple level degenerative change with moderate severe disease at C5-C6 and C6-C7. Facet degenerative changes at multiple levels with foraminal narrowing. Upper chest: Apical scarring Other: None IMPRESSION: 1. No CT evidence for acute intracranial abnormality. Atrophy and mild chronic small vessel ischemic changes of the white matter. 2. Degenerative changes of the cervical spine. No acute osseous abnormality. Electronically Signed   By: Jasmine Pang M.D.   On: 02/09/2020 01:13   CT ANGIO CHEST PE W OR WO CONTRAST  Result Date: 02/16/2020 CLINICAL DATA:  COVID positive peer EXAM: CT ANGIOGRAPHY CHEST WITH CONTRAST TECHNIQUE: Multidetector CT imaging of the chest was performed using the standard protocol during bolus administration of intravenous contrast. Multiplanar CT image reconstructions and MIPs were obtained to evaluate the vascular anatomy. CONTRAST:  OMNIPAQUE IOHEXOL 350 MG/ML SOLN COMPARISON:  Oct 22, 2018 FINDINGS: Cardiovascular: There is moderate severity calcification of the thoracic aorta. Satisfactory opacification of the pulmonary arteries to the segmental level. No evidence of pulmonary embolism. Normal heart size. No pericardial effusion. Mediastinum/Nodes: No enlarged mediastinal, hilar, or axillary lymph nodes. Thyroid gland, trachea, and esophagus demonstrate no significant findings. Lungs/Pleura: Marked severity patchy and ground-glass appearing infiltrates are seen throughout both lungs. There is no evidence of a pleural effusion or pneumothorax. Upper Abdomen:  Numerous subcentimeter gallstones are seen within the lumen of an otherwise normal-appearing gallbladder. Musculoskeletal: No chest wall abnormality. No acute or significant osseous findings. Review of the MIP images confirms the above findings. IMPRESSION: 1. Marked severity bilateral patchy and ground-glass appearing infiltrates. 2. Cholelithiasis. 3. Aortic atherosclerosis. Aortic Atherosclerosis (ICD10-I70.0). Electronically Signed   By: Aram Candela M.D.   On: 02/16/2020 22:13   CT Cervical Spine Wo Contrast  Result Date: 02/09/2020 CLINICAL DATA:  Trauma, fall on blood thinners  EXAM: CT HEAD WITHOUT CONTRAST CT CERVICAL SPINE WITHOUT CONTRAST TECHNIQUE: Multidetector CT imaging of the head and cervical spine was performed following the standard protocol without intravenous contrast. Multiplanar CT image reconstructions of the cervical spine were also generated. COMPARISON:  None. FINDINGS: CT HEAD FINDINGS Brain: No acute territorial infarction, hemorrhage, or intracranial mass. Moderate atrophy. Mild hypodensity in the white matter consistent with chronic small vessel ischemic change. The ventricles are nonenlarged. Vascular: No hyperdense vessels.  Carotid vascular calcification. Skull: No depressed skull fracture. Sinuses/Orbits: Mucosal thickening in the maxillary and ethmoid sinuses. Small fluid level in the left sphenoid sinus but no central skull base lucency is seen. Possible chronic left nasal bone fracture. Other: Mild right posterior scalp swelling. CT CERVICAL SPINE FINDINGS Alignment: No subluxation.  Facet alignment within normal limits. Skull base and vertebrae: No acute fracture. No primary bone lesion or focal pathologic process. Soft tissues and spinal canal: No prevertebral fluid or swelling. No visible canal hematoma. Disc levels: Multiple level degenerative change with moderate severe disease at C5-C6 and C6-C7. Facet degenerative changes at multiple levels with foraminal  narrowing. Upper chest: Apical scarring Other: None IMPRESSION: 1. No CT evidence for acute intracranial abnormality. Atrophy and mild chronic small vessel ischemic changes of the white matter. 2. Degenerative changes of the cervical spine. No acute osseous abnormality. Electronically Signed   By: Jasmine Pang M.D.   On: 02/09/2020 01:13   DG Chest Port 1 View  Result Date: 02/15/2020 CLINICAL DATA:  COVID positive.  Hypoxia. EXAM: PORTABLE CHEST 1 VIEW COMPARISON:  Oct 22, 2018 FINDINGS: Diffuse bilateral interstitial and airspace opacities, new from prior. No visible pleural effusion. No pneumothorax. Cardiac silhouette is accentuated by low lung volumes and portable technique. Aortic atherosclerosis. IMPRESSION: Diffuse bilateral interstitial and airspace opacities, likely multifocal pneumonia given the clinical history. Electronically Signed   By: Feliberto Harts MD   On: 02/15/2020 10:56   VAS [place] LOWER EXTREMITY VENOUS (DVT)  Result Date: 02/16/2020  Lower Venous DVTStudy Indications: Swelling, and elevated d dimer.  Risk Factors: + covid. Performing Technologist: Jannet Askew RCT RDMS  Examination Guidelines: A complete evaluation includes B-mode imaging, spectral Doppler, color Doppler, and power Doppler as needed of all accessible portions of each vessel. Bilateral testing is considered an integral part of a complete examination. Limited examinations for reoccurring indications may be performed as noted. The reflux portion of the exam is performed with the patient in reverse Trendelenburg.  +---------+---------------+---------+-----------+----------+--------------+ RIGHT    CompressibilityPhasicitySpontaneityPropertiesThrombus Aging +---------+---------------+---------+-----------+----------+--------------+ CFV      Full           Yes      Yes                                 +---------+---------------+---------+-----------+----------+--------------+ SFJ      Full                                                         +---------+---------------+---------+-----------+----------+--------------+ FV Prox  Full                                                        +---------+---------------+---------+-----------+----------+--------------+  FV Mid   Full                                                        +---------+---------------+---------+-----------+----------+--------------+ FV DistalFull                                                        +---------+---------------+---------+-----------+----------+--------------+ PFV      Full                                                        +---------+---------------+---------+-----------+----------+--------------+ POP      Full           Yes      Yes                                 +---------+---------------+---------+-----------+----------+--------------+ PTV      Full                                                        +---------+---------------+---------+-----------+----------+--------------+ PERO     Full                                                        +---------+---------------+---------+-----------+----------+--------------+   +---------+---------------+---------+-----------+----------+--------------+ LEFT     CompressibilityPhasicitySpontaneityPropertiesThrombus Aging +---------+---------------+---------+-----------+----------+--------------+ CFV      Full           Yes      Yes                                 +---------+---------------+---------+-----------+----------+--------------+ SFJ      Full                                                        +---------+---------------+---------+-----------+----------+--------------+ FV Prox  Full                                                        +---------+---------------+---------+-----------+----------+--------------+ FV Mid   Full                                                         +---------+---------------+---------+-----------+----------+--------------+  FV DistalFull                                                        +---------+---------------+---------+-----------+----------+--------------+ PFV      Full                                                        +---------+---------------+---------+-----------+----------+--------------+ POP      Full           Yes      Yes                                 +---------+---------------+---------+-----------+----------+--------------+ PTV      Full                                                        +---------+---------------+---------+-----------+----------+--------------+ PERO     Full                                                        +---------+---------------+---------+-----------+----------+--------------+     Summary: RIGHT: - There is no evidence of deep vein thrombosis in the lower extremity.  - No cystic structure found in the popliteal fossa.  LEFT: - There is no evidence of deep vein thrombosis in the lower extremity.  - No cystic structure found in the popliteal fossa.  *See table(s) above for measurements and observations. Electronically signed by Coral Else MD on 02/16/2020 at 9:33:24 PM.    Final    ECHOCARDIOGRAM LIMITED  Result Date: 02/16/2020    ECHOCARDIOGRAM LIMITED REPORT   Patient Name:   KAVONTAE PRITCHARD Date of Exam: 02/16/2020 Medical Rec #:  [id number]       Height:       72.0 in Accession #:    [id number]      Weight:       183.0 lb Date of Birth:  01-24-1935       BSA:          2.052 m Patient Age:    84 years        BP:           114/53 mmHg Patient Gender: M               HR:           77 bpm. Exam Location:  Inpatient Procedure: Limited Echo, Cardiac Doppler and Color Doppler Indications:    Elevated Troponin  History:        Patient has prior history of Echocardiogram examinations, most                 recent 02/17/2019. CHF and Cardiomyopathy, CAD, Arrythmias:Atrial  Fibrillation, Signs/Symptoms:Shortness of Breath and Fever; Risk                 Factors:Hypertension, Dyslipidemia, OBESITY and Diabetes.                 COVID+.  Sonographer:    Lavenia Atlas Referring Phys: [id number] Kasandra Knudsen PAHWANI IMPRESSIONS  1. Left ventricular ejection fraction, by estimation, is 55 to 60%.  2. Right ventricular systolic function is normal. The right ventricular size is normal. There is normal pulmonary artery systolic pressure.  3. The interatrial septum is aneuyrsmal with frequent bowing from right to left.  4. The mitral valve is normal in structure. Trivial mitral valve regurgitation.  5. There is mild calcification of the aortic valve. There is mild thickening of the aortic valve. Mild aortic valve sclerosis is present, with no evidence of aortic valve stenosis. Comparison(s): Compared to prior echo on 01/2019, the LVEF appears mildly improved to 55-60%. FINDINGS  Left Ventricle: Septal motion is abnormal consistent with prior surgery. Left ventricular ejection fraction, by estimation, is 55 to 60%. The left ventricular internal cavity size was normal in size. There is no left ventricular hypertrophy. Right Ventricle: The right ventricular size is normal. Right ventricular systolic function is normal. There is normal pulmonary artery systolic pressure. The tricuspid regurgitant velocity is 2.41 m/s, and with an assumed right atrial pressure of 3 mmHg,  the estimated right ventricular systolic pressure is 26.2 mmHg. Left Atrium: Left atrial size was normal in size. Pericardium: There is no evidence of pericardial effusion. Mitral Valve: The mitral valve is normal in structure. There is mild thickening of the mitral valve leaflet(s). Mild mitral annular calcification. Trivial mitral valve regurgitation. Tricuspid Valve: The tricuspid valve is normal in structure. Tricuspid valve regurgitation is mild. Aortic Valve: There is mild calcification of the aortic valve. There is  mild thickening of the aortic valve. Mild aortic valve sclerosis is present, with no evidence of aortic valve stenosis. Pulmonic Valve: The pulmonic valve was not assessed. Aorta: The aortic root is normal in size and structure. LEFT VENTRICLE PLAX 2D LVIDd:         3.80 cm  Diastology LVIDs:         2.70 cm  LV e' medial:    7.51 cm/s LV PW:         1.20 cm  LV E/e' medial:  7.7 LV IVS:        0.90 cm  LV e' lateral:   5.98 cm/s LVOT diam:     1.90 cm  LV E/e' lateral: 9.7 LVOT Area:     2.84 cm  RIGHT VENTRICLE RV S prime:     13.30 cm/s LEFT ATRIUM         Index LA diam:    2.80 cm 1.36 cm/m   AORTA Ao Root diam: 2.50 cm MITRAL VALVE               TRICUSPID VALVE MV Area (PHT): 5.38 cm    TR Peak grad:   23.2 mmHg MV Decel Time: 141 msec    TR Vmax:        241.00 cm/s MV E velocity: 57.80 cm/s MV A velocity: 70.30 cm/s  SHUNTS MV E/A ratio:  0.82        Systemic Diam: 1.90 cm Laurance Flatten MD Electronically signed by Laurance Flatten MD Signature Date/Time: 02/16/2020/4:23:30 PM    Final    ABORTED INVASIVE LAB PROCEDURE  Result Date: 02/15/2020  This case was aborted.  US Abdomen Limited RUQ  Result Date: 02/15/2020 CLINICAL DATA:  Elevated liver enzymes. EXAM: ULTRASOUND ABDOMEN LIMITED RIGHT UPPER QUADRANT COMPARISON:  CT abdomen 07/06/2019. FINDINGS: Gallbladder: Gallstones measuring up to 7 mm noted. Gallbladder wall thickness normal. No Murphy sign. Common bile duct: Diameter: Not visualized due to overlying bowel gas. Liver: Left no not visualized due to overlying bowel gas. No focal lesion identified. Within normal limits in parenchymal echogenicity. Portal vein is patent on color Doppler imaging with normal direction of blood flow towards the liver. Other: Limited study due to overlying bowel gas. IMPRESSION: 1.  Multiple small gallstones.  No gallbladder wall thickening. 2. Limited exam due to overlying bowel gas. Common bile duct not visualized. Portions of the liver not visualized.  Electronically Signed   By: Maisie Fushomas  Register   On: 02/15/2020 15:35     Huey Bienenstockawood Kathrynne Kulinski M.D on 02/18/2020 at 3:56 PM    [facility] -  Office  [phone]

## 2020-02-18 NOTE — TOC Initial Note (Signed)
Transition of Care [facility]) - Initial/Assessment Note    Patient Details  Name: Gordon Novak MRN: [id number] Date of Birth: 1934/07/11  [facility] CM/SW Contact:    Lockie Pares, RN Phone Number: 02/18/2020, 3:55 PM  Clinical Narrative:                 From home with spouse breakthrough COVID infection . On high flow oxygen currently receiving IV medication for COVID will likely need home oxygen and Home Health post hospitalization. Cm will follow for needs.   Expected Discharge Plan: Home w Home Health Services Barriers to Discharge: Continued Medical Work up   Patient Goals and CMS Choice        Expected Discharge Plan and Services Expected Discharge Plan: Home w Home Health Services   Discharge Planning Services: CM Consult   Living arrangements for the past 2 months: Single Family Home                                      Prior Living Arrangements/Services Living arrangements for the past 2 months: Single Family Home Lives with:: Spouse Patient language and need for interpreter reviewed:: Yes        Need for Family Participation in Patient Care: Yes (Comment) Care giver support system in place?: Yes (comment)   Criminal Activity/Legal Involvement Pertinent to Current Situation/Hospitalization: No - Comment as needed  Activities of Daily Living      Permission Sought/Granted                  Emotional Assessment       Orientation: : Oriented to Self, Oriented to Place, Oriented to  Time, Oriented to Situation Alcohol / Substance Use: Not Applicable Psych Involvement: No (comment)  Admission diagnosis:  Cardiac ischemia [I25.9] Elevated liver enzymes [R74.8] Acute hypoxemic respiratory failure due to COVID-19 (HCC) [U07.1, J96.01] Patient Active Problem List   Diagnosis Date Noted  . Acute hypoxemic respiratory failure due to COVID-19 (HCC) 02/15/2020  . Elevated liver enzymes 02/15/2020  . Essential hypertension   .  Presence of drug-eluting stent in right coronary artery 12/02/2018  . Hypotension due to drugs 12/02/2018  . Ischemic cardiomyopathy 10/30/2018  . Personal history of PE (pulmonary embolism) 10/22/2018  . Type 2 diabetes mellitus (HCC) 10/22/2018  . Coronary artery disease involving native coronary artery of native heart with angina pectoris (HCC) 10/22/2018  . Hyperlipidemia with target LDL less than 70 10/20/2018  . Chronic combined systolic and diastolic heart failure (HCC) 10/20/2018  . Acute ST elevation myocardial infarction (STEMI) of inferior wall (HCC) 10/18/2018  . Proctitis 05/14/2014  . Urinary tract infection 05/12/2014  . Leukocytosis 05/12/2014  . Tachycardia 05/12/2014  . Constipation 05/12/2014  . Hemorrhoids 05/12/2014  . Gout 05/12/2014  . Urinary tract infectious disease   . Nephrolithiasis 05/07/2014   PCP:  [name], MD Pharmacy:   [phone] - [place], [place] - [phone] [place] [address] Phone: [phone] Fax: [phone]  [facility] [place], [place] - [address] [place] [address] Phone: [phone] Fax: [phone]     Social Determinants of Health (SDOH) Interventions    Readmission Risk Interventions No flowsheet data found.

## 2020-02-19 LAB — COMPREHENSIVE METABOLIC PANEL
ALT: 117 U/L — ABNORMAL HIGH (ref 0–44)
AST: 73 U/L — ABNORMAL HIGH (ref 15–41)
Albumin: 1.9 g/dL — ABNORMAL LOW (ref 3.5–5.0)
Alkaline Phosphatase: 101 U/L (ref 38–126)
Anion gap: 8 (ref 5–15)
BUN: 36 mg/dL — ABNORMAL HIGH (ref 8–23)
CO2: 23 mmol/L (ref 22–32)
Calcium: 7.7 mg/dL — ABNORMAL LOW (ref 8.9–10.3)
Chloride: 108 mmol/L (ref 98–111)
Creatinine, Ser: 0.95 mg/dL (ref 0.61–1.24)
GFR calc Af Amer: >60 mL/min (ref >60)
GFR calc non Af Amer: >60 mL/min (ref >60)
Glucose, Bld: 191 mg/dL — ABNORMAL HIGH (ref 70–99)
Potassium: 4.5 mmol/L (ref 3.5–5.1)
Sodium: 139 mmol/L (ref 135–145)
Total Bilirubin: 0.5 mg/dL (ref 0.3–1.2)
Total Protein: 4.8 g/dL — ABNORMAL LOW (ref 6.5–8.1)

## 2020-02-19 LAB — CBC WITH DIFFERENTIAL/PLATELET
Abs Immature Granulocytes: 0.06 10*3/uL (ref 0.00–0.07)
Basophils Absolute: 0 10*3/uL (ref 0.0–0.1)
Basophils Relative: 0 %
Eosinophils Absolute: 0 10*3/uL (ref 0.0–0.5)
Eosinophils Relative: 0 %
HCT: 39.5 % (ref 39.0–52.0)
Hemoglobin: 13.2 g/dL (ref 13.0–17.0)
Immature Granulocytes: 1 %
Lymphocytes Relative: 4 %
Lymphs Abs: 0.3 10*3/uL — ABNORMAL LOW (ref 0.7–4.0)
MCH: 31 pg (ref 26.0–34.0)
MCHC: 33.4 g/dL (ref 30.0–36.0)
MCV: 92.7 fL (ref 80.0–100.0)
Monocytes Absolute: 0.2 10*3/uL (ref 0.1–1.0)
Monocytes Relative: 3 %
Neutro Abs: 7.5 10*3/uL (ref 1.7–7.7)
Neutrophils Relative %: 92 %
Platelets: 250 10*3/uL (ref 150–400)
RBC: 4.26 MIL/uL (ref 4.22–5.81)
RDW: 13.3 % (ref 11.5–15.5)
WBC: 8.1 10*3/uL (ref 4.0–10.5)
nRBC: 0 % (ref 0.0–0.2)

## 2020-02-19 LAB — PHOSPHORUS: Phosphorus: 4.2 mg/dL (ref 2.5–4.6)

## 2020-02-19 LAB — MAGNESIUM: Magnesium: 2.5 mg/dL — ABNORMAL HIGH (ref 1.7–2.4)

## 2020-02-19 LAB — FERRITIN: Ferritin: 3725 ng/mL — ABNORMAL HIGH (ref 24–336)

## 2020-02-19 LAB — GLUCOSE, CAPILLARY
Glucose-Capillary: 185 mg/dL — ABNORMAL HIGH (ref 70–99)
Glucose-Capillary: 201 mg/dL — ABNORMAL HIGH (ref 70–99)
Glucose-Capillary: 277 mg/dL — ABNORMAL HIGH (ref 70–99)

## 2020-02-19 LAB — PROCALCITONIN: Procalcitonin: 0.26 ng/mL

## 2020-02-19 LAB — D-DIMER, QUANTITATIVE: D-Dimer, Quant: 1.78 ug/mL-FEU — ABNORMAL HIGH (ref 0.00–0.50)

## 2020-02-19 LAB — C-REACTIVE PROTEIN: CRP: 2.9 mg/dL — ABNORMAL HIGH (ref <1.0)

## 2020-02-19 MED ORDER — LINAGLIPTIN 5 MG PO TABS
5.0000 mg | ORAL_TABLET | Freq: Every day | ORAL | Status: DC
  Administered 2020-02-19 – 2020-02-29 (×11): 5 mg via ORAL
  Filled 2020-02-19 (×10): qty 1

## 2020-02-19 NOTE — Progress Notes (Signed)
PROGRESS NOTE                                                                                                                                                                                                             Patient Demographics:    Gordon Novak, is a 85 y.o. male, DOB - May 10, 1935, ZOX:[id number]  Admit date - 02/15/2020   Admitting Physician Ollen Bowl, MD  Outpatient Primary MD for the patient is [name], MD  LOS - 4   No chief complaint on file.      Brief Narrative     85 y.o. male with medical history significant of coronary artery disease status post PCI in 2020, hypertension, hyperlipidemia, type 2 diabetes mellitus, PE-not on anticoagulation, presents to emergency department for evaluation of hypoxia.  Patient tested positive for COVID-19 on 9/13.  He is fully vaccinated against COVID-19 with [employer].  He began having COVID-19 symptoms 10 days ago such as fever, cough, shortness of breath.  He was evaluated on 9/15 for a fall however at that time he was maintaining oxygen saturation on room air and he had mild symptoms at that time therefore he was discharged home.  Patient reports worsening shortness of breath therefore he called EMS and was found to have oxygen saturation 68% on room air.  He was placed on nonrebreather during transport and began having some chest pain.  EKG showed STEMI inferior leadstherefore code STEMI was called.  He received aspirin prior to arrival.  Repeat EKG shows no ST EMI.  His initial troponin came back negative.  He was started on NTG and heparin drip.  Patient denies chest pain.  Cardiology was consulted who recommended to hold off catheterization for now as patient's chest pain and EKG improved.  His symptoms were likely secondary to hypoxia.  Upon my evaluation: Patient on high flow nasal cannula, not in acute respiratory distress, communicating well, alert and oriented x3.  Reports that  he is feeling much better.  Reports some cough and shortness of breath however denies chest pain, wheezing, fever, chills, nausea, vomiting, abdominal pain, diarrhea, urinary changes.  He has chronic left lower extremity swelling since 2007.  He denies pain or redness.  He denies use of smoking, alcohol, listed drug use.  He wants to remain full code.  ED Course: Upon  arrival to ED: Patient was tachypneic, hypoxic requiring high flow nasal cannula, CBC shows leukocytosis of 11.5, troponin x1 -, PT APTT: WNL, BNP: 199, CMP shows AST of 195, ALT: 216, alkaline phosphatase: 144.  Chest x-ray shows multifocal pneumonia.  A1c: 7.1.  Patient started on heparin and NTG drip in ED.  Cardiology consulted.  [facility] hospitalist consulted for admission for acute hypoxemic respiratory failure secondary to COVID-19 pneumonia.    Subjective:    Gordon Novak today denies any complaints, sitting in a chair, reports dyspnea at baseline, reports some cough .    Assessment  & Plan :    Principal Problem:   Acute hypoxemic respiratory failure due to COVID-19 [facility]) Active Problems:   Type 2 diabetes mellitus (HCC)   Elevated liver enzymes   Essential hypertension   Acute hypoxemic respiratory failure secondary to COVID-19 pneumonia: -Patient is fully vaccinated against Covid, this is breakthrough COVID-19 infection. -Patient appears to be with severe COVID-19 for pneumonia, with severe lung parenchymal injury due to COVID-19 pneumonia, he remains with significant oxygen requirement, this morning he is back on 3 L oxygen via heated high flow nasal cannula on top of the 15 L oxygen via nonrebreather bag . - Continue with IV steroids. -Treated with IV remdesivir -Continue with baricitinib. -Continue to trend inflammatory markers specially with elevated CRP and D-dimers. -Patient is on empiric coverage for bacterial pneumonia on IV Rocephin and azithromycin given elevated procalcitonin of 2.2 on admission  . -Patient encouraged to use incentive spirometry, flutter valve. -On Lasix as needed, will give 20 mg of IV Lasix today.  SpO2: (!) 89 % O2 Flow Rate (L/min): 30 L/min FiO2 (%): 100 %  COVID-19 Labs  Recent Labs    02/17/20 1136 02/18/20 0358 02/19/20 0226  DDIMER 2.93* 1.97* 1.78*  FERRITIN 2,532* 3,671* 3,725*  CRP 8.0* 4.8* 2.9*    Lab Results  Component Value Date   SARSCOV2NAA NEGATIVE 10/22/2018   SARSCOV2NAA NEGATIVE 10/18/2018     Elevated troponins -Most likely due to demand ischemia from his hypoxia. -Patient has history of coronary artery disease status post PCI. -Cardiology input greatly appreciated, this is most likely in setting of demand ischemia, GI evaluation indicated at this point. -Continue with aspirin, metoprolol and statin.   Elevated d dimer:  -in the setting of covid 19.  But as well high risk for VTE especially in the setting of bilateral PE during during previous hospitalization. -Initially on heparin GTT, currently transitioned to full dose subcu Lovenox, venous Dopplers and CTA with no evidence of DVT or PE.  His Lovenox dose was lowered to 0.5 mg/kg every 12 hours.  Elevated liver enzymes: -To COVID-19 infection, continue to trend.  Trending down slowly. -Right upper quadrant ultrasound significant for gallstone, but no evidence of acute process.  Hypertension: Stable -Continue metoprolol.  Monitor blood pressure closely.  Type 2 diabetes mellitus: A1c 7.1%.  -  Hold Metformin for now.  Start patient on sliding scale insulin and monitor blood sugar closely.  Chronic systolic CHF/ischemic cardiomyopathy: Patient appears euvolemic on exam. -Reviewed echo from 02/17/2019 which showed ejection fraction of 50 to 55%. -Continue Lasix.  Strict INO's and daily weight.  Monitor signs of fluid overload.  Check electrolytes. -Continue statin, metoprolol.  Gout: Continue allopurinol  BPH: Continue Flomax  History of PE: Was on Xarelto  for 1 year.    Chronic left lower extremity swelling: Patient tells me that he has chronic left lower extremity after a kidney stone removal  procedure?  Back in 2007.  Hyperlipidemia: -Continue statin   COVID-19 Labs  Recent Labs    02/17/20 1136 02/18/20 0358 02/19/20 0226  DDIMER 2.93* 1.97* 1.78*  FERRITIN 2,532* 3,671* 3,725*  CRP 8.0* 4.8* 2.9*    Lab Results  Component Value Date   SARSCOV2NAA NEGATIVE 10/22/2018   SARSCOV2NAA NEGATIVE 10/18/2018     Code Status : Full  Family Communication  : daughter [name]  updated by Phone daily.  Disposition Plan  :  Status is: Inpatient  Remains inpatient appropriate because:Hemodynamically unstable, Unsafe d/c plan and IV treatments appropriate due to intensity of illness or inability to take PO   Dispo: The patient is from: Home              Anticipated d/c is to: Home              Anticipated d/c date is: > 3 days              Patient currently is not medically stable to d/c.     Consults  :  cardiology  Procedures  : none  DVT Prophylaxis  :  On Heparin GTT  Lab Results  Component Value Date   PLT 250 02/19/2020    Antibiotics  :   Anti-infectives (From admission, onward)   Start     Dose/Rate Route Frequency Ordered Stop   02/16/20 1330  azithromycin (ZITHROMAX) 500 mg in sodium chloride 0.9 % 250 mL IVPB        500 mg 250 mL/hr over 60 Minutes Intravenous Every 24 hours 02/16/20 1237 02/20/20 2359   02/16/20 1300  cefTRIAXone (ROCEPHIN) 2 g in sodium chloride 0.9 % 100 mL IVPB        2 g 200 mL/hr over 30 Minutes Intravenous Every 24 hours 02/16/20 1237 02/20/20 2359   02/16/20 1000  remdesivir 100 mg in sodium chloride 0.9 % 100 mL IVPB       "Followed by" Linked Group Details   100 mg 200 mL/hr over 30 Minutes Intravenous Daily 02/15/20 1301 02/19/20 0855   02/15/20 1400  remdesivir 200 mg in sodium chloride 0.9% 250 mL IVPB       "Followed by" Linked Group Details   200 mg 580 mL/hr over  30 Minutes Intravenous Once 02/15/20 1301 02/15/20 1504        Objective:   Vitals:   02/19/20 0326 02/19/20 0802 02/19/20 0807 02/19/20 1222  BP: 128/60 135/66 135/66 (!) 112/54  Pulse: 81 61 60 73  Resp: 19  15 18   Temp: 97.9 F (36.6 C) 97.8 F (36.6 C)  97.9 F (36.6 C)  TempSrc: Oral Oral  Oral  SpO2: 90% 90%  (!) 89%  Weight:      Height:        Wt Readings from Last 3 Encounters:  02/15/20 83 kg  02/09/20 83.9 kg  08/30/19 83 kg     Intake/Output Summary (Last 24 hours) at 02/19/2020 1601 Last data filed at 02/19/2020 1447 Gross per 24 hour  Intake 960 ml  Output 900 ml  Net 60 ml     Physical Exam  Awake Alert, Oriented X 3, No new F.N deficits, Normal affect Symmetrical Chest wall movement, Good air movement bilaterally, CTAB RRR,No Gallops,Rubs or new Murmurs, No Parasternal Heave +ve B.Sounds, Abd Soft, No tenderness, No rebound - guarding or rigidity. No Cyanosis, Clubbing, chronic left lower extremity edema.       Data Review:  CBC Recent Labs  Lab 02/15/20 1322 02/16/20 0527 02/17/20 1136 02/18/20 0358 02/19/20 0226  WBC 11.0* 10.5 12.4* 11.2* 8.1  HGB 15.1 14.0 15.4 13.6 13.2  HCT 44.7 41.7 45.7 39.9 39.5  PLT 238 245 290 275 250  MCV 92.7 92.1 92.7 92.6 92.7  MCH 31.3 30.9 31.2 31.6 31.0  MCHC 33.8 33.6 33.7 34.1 33.4  RDW 13.2 13.3 13.5 13.4 13.3  LYMPHSABS 0.6* 0.9 0.8 0.6* 0.3*  MONOABS 0.3 0.4 0.4 0.3 0.2  EOSABS 0.0 0.0 0.0 0.0 0.0  BASOSABS 0.0 0.0 0.0 0.0 0.0    Chemistries  Recent Labs  Lab 02/15/20 1322 02/16/20 0527 02/17/20 1136 02/18/20 0358 02/19/20 0226  NA 137 140 141 141 139  K 4.6 4.6 4.1 4.4 4.5  CL 105 107 110 108 108  CO2 21* 22 18* 22 23  GLUCOSE 194* 162* 252* 214* 191*  BUN 22 26* 33* 34* 36*  CREATININE 0.97 0.96 1.07 0.96 0.95  CALCIUM 8.3* 8.0* 8.2* 8.0* 7.7*  MG  --  2.3 2.5* 2.7* 2.5*  AST 157* 82* 63* 82* 73*  ALT 189* 134* 114* 119* 117*  ALKPHOS 133* 110 107 96 101  BILITOT 1.1  1.1 0.8 0.6 0.5   ------------------------------------------------------------------------------------------------------------------ No results for input(s): CHOL, HDL, LDLCALC, TRIG, CHOLHDL, LDLDIRECT in the last 72 hours.  Lab Results  Component Value Date   HGBA1C 7.1 (H) 02/15/2020   ------------------------------------------------------------------------------------------------------------------ No results for input(s): TSH, T4TOTAL, T3FREE, THYROIDAB in the last 72 hours.  Invalid input(s): FREET3 ------------------------------------------------------------------------------------------------------------------ Recent Labs    02/18/20 0358 02/19/20 0226  FERRITIN 3,671* 3,725*    Coagulation profile Recent Labs  Lab 02/15/20 1022  INR 1.2    Recent Labs    02/18/20 0358 02/19/20 0226  DDIMER 1.97* 1.78*    Cardiac Enzymes No results for input(s): CKMB, TROPONINI, MYOGLOBIN in the last 168 hours.  Invalid input(s): CK ------------------------------------------------------------------------------------------------------------------    Component Value Date/Time   BNP 199.1 (H) 02/15/2020 1040    Inpatient Medications  Scheduled Meds: . allopurinol  300 mg Oral Daily  . vitamin C  500 mg Oral Daily  . aspirin EC  81 mg Oral Daily  . baricitinib  4 mg Oral Daily  . enoxaparin (LOVENOX) injection  40 mg Subcutaneous BID  . furosemide  20 mg Oral Daily  . insulin aspart  0-15 Units Subcutaneous TID WC  . insulin aspart  0-5 Units Subcutaneous QHS  . methylPREDNISolone (SOLU-MEDROL) injection  80 mg Intravenous Q12H  . metoprolol succinate  12.5 mg Oral QHS  . pantoprazole  40 mg Oral Daily  . rosuvastatin  10 mg Oral Daily  . tamsulosin  0.4 mg Oral QHS  . zinc sulfate  220 mg Oral Daily   Continuous Infusions: . sodium chloride 10 mL/hr at 02/17/20 0500  . azithromycin 500 mg (02/19/20 1229)  . cefTRIAXone (ROCEPHIN)  IV 2 g (02/18/20 1350)   PRN  Meds:.acetaminophen, chlorpheniramine-HYDROcodone, guaiFENesin-dextromethorphan, ondansetron **OR** ondansetron (ZOFRAN) IV  Micro Results Recent Results (from the past 240 hour(s))  Culture, blood (Routine X 2) w Reflex to ID Panel     Status: None (Preliminary result)   Collection Time: 02/15/20  1:24 PM   Specimen: BLOOD  Result Value Ref Range Status   Specimen Description BLOOD RIGHT ANTECUBITAL  Final   Special Requests   Final    BOTTLES DRAWN AEROBIC AND ANAEROBIC Blood Culture adequate volume   Culture   Final    NO GROWTH 4  DAYS Performed at [facility], [address], [place], [place] [address]    Report Status PENDING  Incomplete  Culture, blood (Routine X 2) w Reflex to ID Panel     Status: None (Preliminary result)   Collection Time: 02/15/20  1:24 PM   Specimen: BLOOD  Result Value Ref Range Status   Specimen Description BLOOD BLOOD LEFT HAND  Final   Special Requests   Final    BOTTLES DRAWN AEROBIC AND ANAEROBIC Blood Culture adequate volume   Culture   Final    NO GROWTH 4 DAYS Performed at [facility], [address] [place], [place] [address]    Report Status PENDING  Incomplete    Radiology Reports CT Head Wo Contrast  Result Date: 02/09/2020 CLINICAL DATA:  Trauma, fall on blood thinners EXAM: CT HEAD WITHOUT CONTRAST CT CERVICAL SPINE WITHOUT CONTRAST TECHNIQUE: Multidetector CT imaging of the head and cervical spine was performed following the standard protocol without intravenous contrast. Multiplanar CT image reconstructions of the cervical spine were also generated. COMPARISON:  None. FINDINGS: CT HEAD FINDINGS Brain: No acute territorial infarction, hemorrhage, or intracranial mass. Moderate atrophy. Mild hypodensity in the white matter consistent with chronic small vessel ischemic change. The ventricles are nonenlarged. Vascular: No hyperdense vessels.  Carotid vascular calcification. Skull: No depressed skull fracture. Sinuses/Orbits:  Mucosal thickening in the maxillary and ethmoid sinuses. Small fluid level in the left sphenoid sinus but no central skull base lucency is seen. Possible chronic left nasal bone fracture. Other: Mild right posterior scalp swelling. CT CERVICAL SPINE FINDINGS Alignment: No subluxation.  Facet alignment within normal limits. Skull base and vertebrae: No acute fracture. No primary bone lesion or focal pathologic process. Soft tissues and spinal canal: No prevertebral fluid or swelling. No visible canal hematoma. Disc levels: Multiple level degenerative change with moderate severe disease at C5-C6 and C6-C7. Facet degenerative changes at multiple levels with foraminal narrowing. Upper chest: Apical scarring Other: None IMPRESSION: 1. No CT evidence for acute intracranial abnormality. Atrophy and mild chronic small vessel ischemic changes of the white matter. 2. Degenerative changes of the cervical spine. No acute osseous abnormality. Electronically Signed   By: Jasmine Pang M.D.   On: 02/09/2020 01:13   CT ANGIO CHEST PE W OR WO CONTRAST  Result Date: 02/16/2020 CLINICAL DATA:  COVID positive peer EXAM: CT ANGIOGRAPHY CHEST WITH CONTRAST TECHNIQUE: Multidetector CT imaging of the chest was performed using the standard protocol during bolus administration of intravenous contrast. Multiplanar CT image reconstructions and MIPs were obtained to evaluate the vascular anatomy. CONTRAST:  OMNIPAQUE IOHEXOL 350 MG/ML SOLN COMPARISON:  Oct 22, 2018 FINDINGS: Cardiovascular: There is moderate severity calcification of the thoracic aorta. Satisfactory opacification of the pulmonary arteries to the segmental level. No evidence of pulmonary embolism. Normal heart size. No pericardial effusion. Mediastinum/Nodes: No enlarged mediastinal, hilar, or axillary lymph nodes. Thyroid gland, trachea, and esophagus demonstrate no significant findings. Lungs/Pleura: Marked severity patchy and ground-glass appearing infiltrates are  seen throughout both lungs. There is no evidence of a pleural effusion or pneumothorax. Upper Abdomen: Numerous subcentimeter gallstones are seen within the lumen of an otherwise normal-appearing gallbladder. Musculoskeletal: No chest wall abnormality. No acute or significant osseous findings. Review of the MIP images confirms the above findings. IMPRESSION: 1. Marked severity bilateral patchy and ground-glass appearing infiltrates. 2. Cholelithiasis. 3. Aortic atherosclerosis. Aortic Atherosclerosis (ICD10-I70.0). Electronically Signed   By: Aram Candela M.D.   On: 02/16/2020 22:13  CT Cervical Spine Wo Contrast  Result Date: 02/09/2020 CLINICAL DATA:  Trauma, fall on blood thinners EXAM: CT HEAD WITHOUT CONTRAST CT CERVICAL SPINE WITHOUT CONTRAST TECHNIQUE: Multidetector CT imaging of the head and cervical spine was performed following the standard protocol without intravenous contrast. Multiplanar CT image reconstructions of the cervical spine were also generated. COMPARISON:  None. FINDINGS: CT HEAD FINDINGS Brain: No acute territorial infarction, hemorrhage, or intracranial mass. Moderate atrophy. Mild hypodensity in the white matter consistent with chronic small vessel ischemic change. The ventricles are nonenlarged. Vascular: No hyperdense vessels.  Carotid vascular calcification. Skull: No depressed skull fracture. Sinuses/Orbits: Mucosal thickening in the maxillary and ethmoid sinuses. Small fluid level in the left sphenoid sinus but no central skull base lucency is seen. Possible chronic left nasal bone fracture. Other: Mild right posterior scalp swelling. CT CERVICAL SPINE FINDINGS Alignment: No subluxation.  Facet alignment within normal limits. Skull base and vertebrae: No acute fracture. No primary bone lesion or focal pathologic process. Soft tissues and spinal canal: No prevertebral fluid or swelling. No visible canal hematoma. Disc levels: Multiple level degenerative change with moderate  severe disease at C5-C6 and C6-C7. Facet degenerative changes at multiple levels with foraminal narrowing. Upper chest: Apical scarring Other: None IMPRESSION: 1. No CT evidence for acute intracranial abnormality. Atrophy and mild chronic small vessel ischemic changes of the white matter. 2. Degenerative changes of the cervical spine. No acute osseous abnormality. Electronically Signed   By: Jasmine Pang M.D.   On: 02/09/2020 01:13   DG Chest Port 1 View  Result Date: 02/15/2020 CLINICAL DATA:  COVID positive.  Hypoxia. EXAM: PORTABLE CHEST 1 VIEW COMPARISON:  Oct 22, 2018 FINDINGS: Diffuse bilateral interstitial and airspace opacities, new from prior. No visible pleural effusion. No pneumothorax. Cardiac silhouette is accentuated by low lung volumes and portable technique. Aortic atherosclerosis. IMPRESSION: Diffuse bilateral interstitial and airspace opacities, likely multifocal pneumonia given the clinical history. Electronically Signed   By: Feliberto Harts MD   On: 02/15/2020 10:56   VAS [place] LOWER EXTREMITY VENOUS (DVT)  Result Date: 02/16/2020  Lower Venous DVTStudy Indications: Swelling, and elevated d dimer.  Risk Factors: + covid. Performing Technologist: Jannet Askew RCT RDMS  Examination Guidelines: A complete evaluation includes B-mode imaging, spectral Doppler, color Doppler, and power Doppler as needed of all accessible portions of each vessel. Bilateral testing is considered an integral part of a complete examination. Limited examinations for reoccurring indications may be performed as noted. The reflux portion of the exam is performed with the patient in reverse Trendelenburg.  +---------+---------------+---------+-----------+----------+--------------+ RIGHT    CompressibilityPhasicitySpontaneityPropertiesThrombus Aging +---------+---------------+---------+-----------+----------+--------------+ CFV      Full           Yes      Yes                                  +---------+---------------+---------+-----------+----------+--------------+ SFJ      Full                                                        +---------+---------------+---------+-----------+----------+--------------+ FV Prox  Full                                                        +---------+---------------+---------+-----------+----------+--------------+  FV Mid   Full                                                        +---------+---------------+---------+-----------+----------+--------------+ FV DistalFull                                                        +---------+---------------+---------+-----------+----------+--------------+ PFV      Full                                                        +---------+---------------+---------+-----------+----------+--------------+ POP      Full           Yes      Yes                                 +---------+---------------+---------+-----------+----------+--------------+ PTV      Full                                                        +---------+---------------+---------+-----------+----------+--------------+ PERO     Full                                                        +---------+---------------+---------+-----------+----------+--------------+   +---------+---------------+---------+-----------+----------+--------------+ LEFT     CompressibilityPhasicitySpontaneityPropertiesThrombus Aging +---------+---------------+---------+-----------+----------+--------------+ CFV      Full           Yes      Yes                                 +---------+---------------+---------+-----------+----------+--------------+ SFJ      Full                                                        +---------+---------------+---------+-----------+----------+--------------+ FV Prox  Full                                                         +---------+---------------+---------+-----------+----------+--------------+ FV Mid   Full                                                        +---------+---------------+---------+-----------+----------+--------------+  FV DistalFull                                                        +---------+---------------+---------+-----------+----------+--------------+ PFV      Full                                                        +---------+---------------+---------+-----------+----------+--------------+ POP      Full           Yes      Yes                                 +---------+---------------+---------+-----------+----------+--------------+ PTV      Full                                                        +---------+---------------+---------+-----------+----------+--------------+ PERO     Full                                                        +---------+---------------+---------+-----------+----------+--------------+     Summary: RIGHT: - There is no evidence of deep vein thrombosis in the lower extremity.  - No cystic structure found in the popliteal fossa.  LEFT: - There is no evidence of deep vein thrombosis in the lower extremity.  - No cystic structure found in the popliteal fossa.  *See table(s) above for measurements and observations. Electronically signed by Coral Else MD on 02/16/2020 at 9:33:24 PM.    Final    ECHOCARDIOGRAM LIMITED  Result Date: 02/16/2020    ECHOCARDIOGRAM LIMITED REPORT   Patient Name:   Gordon Novak Date of Exam: 02/16/2020 Medical Rec #:  [id number]       Height:       72.0 in Accession #:    [id number]      Weight:       183.0 lb Date of Birth:  02-13-1935       BSA:          2.052 m Patient Age:    84 years        BP:           114/53 mmHg Patient Gender: M               HR:           77 bpm. Exam Location:  Inpatient Procedure: Limited Echo, Cardiac Doppler and Color Doppler Indications:    Elevated Troponin  History:         Patient has prior history of Echocardiogram examinations, most                 recent 02/17/2019. CHF and Cardiomyopathy, CAD, Arrythmias:Atrial  Fibrillation, Signs/Symptoms:Shortness of Breath and Fever; Risk                 Factors:Hypertension, Dyslipidemia, OBESITY and Diabetes.                 COVID+.  Sonographer:    Lavenia Atlas Referring Phys: [id number] Kasandra Knudsen PAHWANI IMPRESSIONS  1. Left ventricular ejection fraction, by estimation, is 55 to 60%.  2. Right ventricular systolic function is normal. The right ventricular size is normal. There is normal pulmonary artery systolic pressure.  3. The interatrial septum is aneuyrsmal with frequent bowing from right to left.  4. The mitral valve is normal in structure. Trivial mitral valve regurgitation.  5. There is mild calcification of the aortic valve. There is mild thickening of the aortic valve. Mild aortic valve sclerosis is present, with no evidence of aortic valve stenosis. Comparison(s): Compared to prior echo on 01/2019, the LVEF appears mildly improved to 55-60%. FINDINGS  Left Ventricle: Septal motion is abnormal consistent with prior surgery. Left ventricular ejection fraction, by estimation, is 55 to 60%. The left ventricular internal cavity size was normal in size. There is no left ventricular hypertrophy. Right Ventricle: The right ventricular size is normal. Right ventricular systolic function is normal. There is normal pulmonary artery systolic pressure. The tricuspid regurgitant velocity is 2.41 m/s, and with an assumed right atrial pressure of 3 mmHg,  the estimated right ventricular systolic pressure is 26.2 mmHg. Left Atrium: Left atrial size was normal in size. Pericardium: There is no evidence of pericardial effusion. Mitral Valve: The mitral valve is normal in structure. There is mild thickening of the mitral valve leaflet(s). Mild mitral annular calcification. Trivial mitral valve regurgitation. Tricuspid Valve: The  tricuspid valve is normal in structure. Tricuspid valve regurgitation is mild. Aortic Valve: There is mild calcification of the aortic valve. There is mild thickening of the aortic valve. Mild aortic valve sclerosis is present, with no evidence of aortic valve stenosis. Pulmonic Valve: The pulmonic valve was not assessed. Aorta: The aortic root is normal in size and structure. LEFT VENTRICLE PLAX 2D LVIDd:         3.80 cm  Diastology LVIDs:         2.70 cm  LV e' medial:    7.51 cm/s LV PW:         1.20 cm  LV E/e' medial:  7.7 LV IVS:        0.90 cm  LV e' lateral:   5.98 cm/s LVOT diam:     1.90 cm  LV E/e' lateral: 9.7 LVOT Area:     2.84 cm  RIGHT VENTRICLE RV S prime:     13.30 cm/s LEFT ATRIUM         Index LA diam:    2.80 cm 1.36 cm/m   AORTA Ao Root diam: 2.50 cm MITRAL VALVE               TRICUSPID VALVE MV Area (PHT): 5.38 cm    TR Peak grad:   23.2 mmHg MV Decel Time: 141 msec    TR Vmax:        241.00 cm/s MV E velocity: 57.80 cm/s MV A velocity: 70.30 cm/s  SHUNTS MV E/A ratio:  0.82        Systemic Diam: 1.90 cm Laurance Flatten MD Electronically signed by Laurance Flatten MD Signature Date/Time: 02/16/2020/4:23:30 PM    Final    ABORTED INVASIVE LAB PROCEDURE  Result Date: 02/15/2020  This case was aborted.  US Abdomen Limited RUQ  Result Date: 02/15/2020 CLINICAL DATA:  Elevated liver enzymes. EXAM: ULTRASOUND ABDOMEN LIMITED RIGHT UPPER QUADRANT COMPARISON:  CT abdomen 07/06/2019. FINDINGS: Gallbladder: Gallstones measuring up to 7 mm noted. Gallbladder wall thickness normal. No Murphy sign. Common bile duct: Diameter: Not visualized due to overlying bowel gas. Liver: Left no not visualized due to overlying bowel gas. No focal lesion identified. Within normal limits in parenchymal echogenicity. Portal vein is patent on color Doppler imaging with normal direction of blood flow towards the liver. Other: Limited study due to overlying bowel gas. IMPRESSION: 1.  Multiple small gallstones.   No gallbladder wall thickening. 2. Limited exam due to overlying bowel gas. Common bile duct not visualized. Portions of the liver not visualized. Electronically Signed   By: Maisie Fus  Register   On: 02/15/2020 15:35     Huey Bienenstock M.D on 02/19/2020 at 4:01 PM    [facility] -  Office  [phone]

## 2020-02-20 LAB — COMPREHENSIVE METABOLIC PANEL
ALT: 109 U/L — ABNORMAL HIGH (ref 0–44)
AST: 58 U/L — ABNORMAL HIGH (ref 15–41)
Albumin: 1.8 g/dL — ABNORMAL LOW (ref 3.5–5.0)
Alkaline Phosphatase: 93 U/L (ref 38–126)
Anion gap: 9 (ref 5–15)
BUN: 35 mg/dL — ABNORMAL HIGH (ref 8–23)
CO2: 23 mmol/L (ref 22–32)
Calcium: 7.8 mg/dL — ABNORMAL LOW (ref 8.9–10.3)
Chloride: 107 mmol/L (ref 98–111)
Creatinine, Ser: 0.92 mg/dL (ref 0.61–1.24)
GFR calc Af Amer: >60 mL/min (ref >60)
GFR calc non Af Amer: >60 mL/min (ref >60)
Glucose, Bld: 211 mg/dL — ABNORMAL HIGH (ref 70–99)
Potassium: 4.5 mmol/L (ref 3.5–5.1)
Sodium: 139 mmol/L (ref 135–145)
Total Bilirubin: 0.8 mg/dL (ref 0.3–1.2)
Total Protein: 4.7 g/dL — ABNORMAL LOW (ref 6.5–8.1)

## 2020-02-20 LAB — CBC WITH DIFFERENTIAL/PLATELET
Abs Immature Granulocytes: 0.06 10*3/uL (ref 0.00–0.07)
Basophils Absolute: 0 10*3/uL (ref 0.0–0.1)
Basophils Relative: 0 %
Eosinophils Absolute: 0 10*3/uL (ref 0.0–0.5)
Eosinophils Relative: 0 %
HCT: 38.7 % — ABNORMAL LOW (ref 39.0–52.0)
Hemoglobin: 13.1 g/dL (ref 13.0–17.0)
Immature Granulocytes: 1 %
Lymphocytes Relative: 2 %
Lymphs Abs: 0.2 10*3/uL — ABNORMAL LOW (ref 0.7–4.0)
MCH: 31.6 pg (ref 26.0–34.0)
MCHC: 33.9 g/dL (ref 30.0–36.0)
MCV: 93.3 fL (ref 80.0–100.0)
Monocytes Absolute: 0.2 10*3/uL (ref 0.1–1.0)
Monocytes Relative: 3 %
Neutro Abs: 8.4 10*3/uL — ABNORMAL HIGH (ref 1.7–7.7)
Neutrophils Relative %: 94 %
Platelets: 252 10*3/uL (ref 150–400)
RBC: 4.15 MIL/uL — ABNORMAL LOW (ref 4.22–5.81)
RDW: 13.4 % (ref 11.5–15.5)
WBC: 8.9 10*3/uL (ref 4.0–10.5)
nRBC: 0 % (ref 0.0–0.2)

## 2020-02-20 LAB — CULTURE, BLOOD (ROUTINE X 2)
Culture: NO GROWTH
Culture: NO GROWTH
Special Requests: ADEQUATE
Special Requests: ADEQUATE

## 2020-02-20 LAB — FERRITIN: Ferritin: 3195 ng/mL — ABNORMAL HIGH (ref 24–336)

## 2020-02-20 LAB — C-REACTIVE PROTEIN: CRP: 2 mg/dL — ABNORMAL HIGH (ref <1.0)

## 2020-02-20 LAB — PHOSPHORUS: Phosphorus: 3.6 mg/dL (ref 2.5–4.6)

## 2020-02-20 LAB — D-DIMER, QUANTITATIVE: D-Dimer, Quant: 1.6 ug/mL-FEU — ABNORMAL HIGH (ref 0.00–0.50)

## 2020-02-20 LAB — MAGNESIUM: Magnesium: 2.7 mg/dL — ABNORMAL HIGH (ref 1.7–2.4)

## 2020-02-20 MED ORDER — INSULIN DETEMIR 100 UNIT/ML ~~LOC~~ SOLN
6.0000 [IU] | Freq: Every day | SUBCUTANEOUS | Status: DC
  Filled 2020-02-20 (×2): qty 0.06

## 2020-02-20 NOTE — Progress Notes (Signed)
Patient's daughter called and stated that pt recently had a cornea transplant and is supposed to be taking prescription eye drops. Prednisolone acetate 1% ophthalmic suspension. 1 drop in each eye twice day. Will endorse to day shift RN to alert physician.

## 2020-02-20 NOTE — Progress Notes (Signed)
PROGRESS NOTE                                                                                                                                                                                                             Patient Demographics:    Gordon Novak, is a 85 y.o. male, DOB - 06-04-1934, WGN:[id number]  Admit date - 02/15/2020   Admitting Physician Ollen Bowl, MD  Outpatient Primary MD for the patient is [name], MD  LOS - 5   No chief complaint on file.      Brief Narrative     85 y.o. male with medical history significant of coronary artery disease status post PCI in 2020, hypertension, hyperlipidemia, type 2 diabetes mellitus, PE-not on anticoagulation, presents to emergency department for evaluation of hypoxia.  Patient tested positive for COVID-19 on 9/13.  He is fully vaccinated against COVID-19 with [employer].  He began having COVID-19 symptoms 10 days ago such as fever, cough, shortness of breath.  He was evaluated on 9/15 for a fall however at that time he was maintaining oxygen saturation on room air and he had mild symptoms at that time therefore he was discharged home.  Patient reports worsening shortness of breath therefore he called EMS and was found to have oxygen saturation 68% on room air.  He was placed on nonrebreather during transport and began having some chest pain.  EKG showed STEMI inferior leadstherefore code STEMI was called.  He received aspirin prior to arrival.  Repeat EKG shows no ST EMI.  His initial troponin came back negative.  He was started on NTG and heparin drip.  Patient denies chest pain.  Cardiology was consulted who recommended to hold off catheterization for now as patient's chest pain and EKG improved.  His symptoms were likely secondary to hypoxia.  Upon my evaluation: Patient on high flow nasal cannula, not in acute respiratory distress, communicating well, alert and oriented x3.  Reports that  he is feeling much better.  Reports some cough and shortness of breath however denies chest pain, wheezing, fever, chills, nausea, vomiting, abdominal pain, diarrhea, urinary changes.  He has chronic left lower extremity swelling since 2007.  He denies pain or redness.  He denies use of smoking, alcohol, listed drug use.  He wants to remain full code.  ED Course: Upon  arrival to ED: Patient was tachypneic, hypoxic requiring high flow nasal cannula, CBC shows leukocytosis of 11.5, troponin x1 -, PT APTT: WNL, BNP: 199, CMP shows AST of 195, ALT: 216, alkaline phosphatase: 144.  Chest x-ray shows multifocal pneumonia.  A1c: 7.1.  Patient started on heparin and NTG drip in ED.  Cardiology consulted.  [facility] hospitalist consulted for admission for acute hypoxemic respiratory failure secondary to COVID-19 pneumonia.    Subjective:    Gordon Novak today denies any complaints, sitting in a chair, reports dyspnea at baseline, reports some cough .    Assessment  & Plan :    Principal Problem:   Acute hypoxemic respiratory failure due to COVID-19 [facility]) Active Problems:   Type 2 diabetes mellitus (HCC)   Elevated liver enzymes   Essential hypertension   Acute hypoxemic respiratory failure secondary to COVID-19 pneumonia: -Patient is fully vaccinated against Covid, this is breakthrough COVID-19 infection. -Patient appears to be with severe COVID-19 for pneumonia, with severe lung parenchymal injury due to COVID-19 pneumonia, he is with significant oxygen requirement, this has been gradually improving, this morning he is on 20 L heated high flow nasal cannula , still requiring NRB with activity . - Continue with IV steroids. -Treated with IV remdesivir -Continue with baricitinib. -Continue to trend inflammatory markers specially with elevated CRP and D-dimers. -Patient is on empiric coverage for bacterial pneumonia on IV Rocephin and azithromycin given elevated procalcitonin of 2.2 on admission  . -Patient encouraged to use incentive spirometry, flutter valve. -On Lasix as needed, will give 20 mg of IV Lasix today.  SpO2: 94 % O2 Flow Rate (L/min): 20 L/min FiO2 (%): 90 %  COVID-19 Labs  Recent Labs    02/18/20 0358 02/19/20 0226 02/20/20 0338  DDIMER 1.97* 1.78* 1.60*  FERRITIN 3,671* 3,725* 3,195*  CRP 4.8* 2.9* 2.0*    Lab Results  Component Value Date   SARSCOV2NAA NEGATIVE 10/22/2018   SARSCOV2NAA NEGATIVE 10/18/2018     Elevated troponins -Most likely due to demand ischemia from his hypoxia. -Patient has history of coronary artery disease status post PCI. -Cardiology input greatly appreciated, this is most likely in setting of demand ischemia, GI evaluation indicated at this point. -Continue with aspirin, metoprolol and statin.   Elevated d dimer:  -in the setting of covid 19.  But as well high risk for VTE especially in the setting of bilateral PE during during previous hospitalization. -Initially on heparin GTT, currently transitioned to full dose subcu Lovenox, venous Dopplers and CTA with no evidence of DVT or PE.  His Lovenox dose was lowered to 0.5 mg/kg every 12 hours.  Elevated liver enzymes: -To COVID-19 infection, continue to trend.  Trending down slowly. -Right upper quadrant ultrasound significant for gallstone, but no evidence of acute process.  Hypertension: Stable -Continue metoprolol.  Monitor blood pressure closely.  Type 2 diabetes mellitus: A1c 7.1%.  -  Hold Metformin for now.  Start patient on sliding scale insulin and monitor blood sugar closely.  Chronic systolic CHF/ischemic cardiomyopathy: Patient appears euvolemic on exam. -Reviewed echo from 02/17/2019 which showed ejection fraction of 50 to 55%. -Continue Lasix.  Strict INO's and daily weight.  Monitor signs of fluid overload.  Check electrolytes. -Continue statin, metoprolol.  Gout: Continue allopurinol  BPH: Continue Flomax  History of PE: Was on Xarelto for  1 year.    Chronic left lower extremity swelling: Patient tells me that he has chronic left lower extremity after a kidney stone removal procedure?  Back  in 2007.  Hyperlipidemia: -Continue statin   COVID-19 Labs  Recent Labs    02/18/20 0358 02/19/20 0226 02/20/20 0338  DDIMER 1.97* 1.78* 1.60*  FERRITIN 3,671* 3,725* 3,195*  CRP 4.8* 2.9* 2.0*    Lab Results  Component Value Date   SARSCOV2NAA NEGATIVE 10/22/2018   SARSCOV2NAA NEGATIVE 10/18/2018     Code Status : Full  Family Communication  : daughter [name]  updated by Phone, most recent 9/25  Disposition Plan  :  Status is: Inpatient  Remains inpatient appropriate because:Hemodynamically unstable, Unsafe d/c plan and IV treatments appropriate due to intensity of illness or inability to take PO   Dispo: The patient is from: Home              Anticipated d/c is to: Home              Anticipated d/c date is: > 3 days              Patient currently is not medically stable to d/c.     Consults  :  cardiology  Procedures  : none  DVT Prophylaxis  :  On Heparin GTT  Lab Results  Component Value Date   PLT 252 02/20/2020    Antibiotics  :   Anti-infectives (From admission, onward)   Start     Dose/Rate Route Frequency Ordered Stop   02/16/20 1330  azithromycin (ZITHROMAX) 500 mg in sodium chloride 0.9 % 250 mL IVPB        500 mg 250 mL/hr over 60 Minutes Intravenous Every 24 hours 02/16/20 1237 02/20/20 1320   02/16/20 1300  cefTRIAXone (ROCEPHIN) 2 g in sodium chloride 0.9 % 100 mL IVPB        2 g 200 mL/hr over 30 Minutes Intravenous Every 24 hours 02/16/20 1237 02/20/20 1252   02/16/20 1000  remdesivir 100 mg in sodium chloride 0.9 % 100 mL IVPB       "Followed by" Linked Group Details   100 mg 200 mL/hr over 30 Minutes Intravenous Daily 02/15/20 1301 02/19/20 0855   02/15/20 1400  remdesivir 200 mg in sodium chloride 0.9% 250 mL IVPB       "Followed by" Linked Group Details   200 mg 580  mL/hr over 30 Minutes Intravenous Once 02/15/20 1301 02/15/20 1504        Objective:   Vitals:   02/20/20 0700 02/20/20 0752 02/20/20 1143 02/20/20 1528  BP:  126/61 (!) 102/45   Pulse:  68 78   Resp:  17 17   Temp:  98.1 F (36.7 C) 98 F (36.7 C)   TempSrc:  Oral Oral   SpO2: 90% 90% 90% 94%  Weight:      Height:        Wt Readings from Last 3 Encounters:  02/15/20 83 kg  02/09/20 83.9 kg  08/30/19 83 kg     Intake/Output Summary (Last 24 hours) at 02/20/2020 1545 Last data filed at 02/20/2020 1450 Gross per 24 hour  Intake 590 ml  Output 300 ml  Net 290 ml     Physical Exam  Awake Alert, Oriented X 3, No new F.N deficits, Normal affect Symmetrical Chest wall movement, Good air movement bilaterally, CTAB RRR,No Gallops,Rubs or new Murmurs, No Parasternal Heave +ve B.Sounds, Abd Soft, No tenderness, No rebound - guarding or rigidity. No Cyanosis, Clubbing or edema, No new Rash or bruise         Data Review:    CBC  Recent Labs  Lab 02/16/20 0527 02/17/20 1136 02/18/20 0358 02/19/20 0226 02/20/20 0338  WBC 10.5 12.4* 11.2* 8.1 8.9  HGB 14.0 15.4 13.6 13.2 13.1  HCT 41.7 45.7 39.9 39.5 38.7*  PLT 245 290 275 250 252  MCV 92.1 92.7 92.6 92.7 93.3  MCH 30.9 31.2 31.6 31.0 31.6  MCHC 33.6 33.7 34.1 33.4 33.9  RDW 13.3 13.5 13.4 13.3 13.4  LYMPHSABS 0.9 0.8 0.6* 0.3* 0.2*  MONOABS 0.4 0.4 0.3 0.2 0.2  EOSABS 0.0 0.0 0.0 0.0 0.0  BASOSABS 0.0 0.0 0.0 0.0 0.0    Chemistries  Recent Labs  Lab 02/16/20 0527 02/17/20 1136 02/18/20 0358 02/19/20 0226 02/20/20 0338  NA 140 141 141 139 139  K 4.6 4.1 4.4 4.5 4.5  CL 107 110 108 108 107  CO2 22 18* 22 23 23   GLUCOSE 162* 252* 214* 191* 211*  BUN 26* 33* 34* 36* 35*  CREATININE 0.96 1.07 0.96 0.95 0.92  CALCIUM 8.0* 8.2* 8.0* 7.7* 7.8*  MG 2.3 2.5* 2.7* 2.5* 2.7*  AST 82* 63* 82* 73* 58*  ALT 134* 114* 119* 117* 109*  ALKPHOS 110 107 96 101 93  BILITOT 1.1 0.8 0.6 0.5 0.8    ------------------------------------------------------------------------------------------------------------------ No results for input(s): CHOL, HDL, LDLCALC, TRIG, CHOLHDL, LDLDIRECT in the last 72 hours.  Lab Results  Component Value Date   HGBA1C 7.1 (H) 02/15/2020   ------------------------------------------------------------------------------------------------------------------ No results for input(s): TSH, T4TOTAL, T3FREE, THYROIDAB in the last 72 hours.  Invalid input(s): FREET3 ------------------------------------------------------------------------------------------------------------------ Recent Labs    02/19/20 0226 02/20/20 0338  FERRITIN 3,725* 3,195*    Coagulation profile Recent Labs  Lab 02/15/20 1022  INR 1.2    Recent Labs    02/19/20 0226 02/20/20 0338  DDIMER 1.78* 1.60*    Cardiac Enzymes No results for input(s): CKMB, TROPONINI, MYOGLOBIN in the last 168 hours.  Invalid input(s): CK ------------------------------------------------------------------------------------------------------------------    Component Value Date/Time   BNP 199.1 (H) 02/15/2020 1040    Inpatient Medications  Scheduled Meds: . allopurinol  300 mg Oral Daily  . vitamin C  500 mg Oral Daily  . aspirin EC  81 mg Oral Daily  . baricitinib  4 mg Oral Daily  . enoxaparin (LOVENOX) injection  40 mg Subcutaneous BID  . furosemide  20 mg Oral Daily  . insulin aspart  0-15 Units Subcutaneous TID WC  . insulin aspart  0-5 Units Subcutaneous QHS  . linagliptin  5 mg Oral Daily  . methylPREDNISolone (SOLU-MEDROL) injection  80 mg Intravenous Q12H  . metoprolol succinate  12.5 mg Oral QHS  . pantoprazole  40 mg Oral Daily  . rosuvastatin  10 mg Oral Daily  . tamsulosin  0.4 mg Oral QHS  . zinc sulfate  220 mg Oral Daily   Continuous Infusions: . sodium chloride 10 mL/hr at 02/17/20 0500   PRN Meds:.acetaminophen, chlorpheniramine-HYDROcodone,  guaiFENesin-dextromethorphan, ondansetron **OR** ondansetron (ZOFRAN) IV  Micro Results Recent Results (from the past 240 hour(s))  Culture, blood (Routine X 2) w Reflex to ID Panel     Status: None   Collection Time: 02/15/20  1:24 PM   Specimen: BLOOD  Result Value Ref Range Status   Specimen Description BLOOD RIGHT ANTECUBITAL  Final   Special Requests   Final    BOTTLES DRAWN AEROBIC AND ANAEROBIC Blood Culture adequate volume   Culture   Final    NO GROWTH 5 DAYS Performed at [facility], [address] [place], [place] [address]  Report Status 02/20/2020 FINAL  Final  Culture, blood (Routine X 2) w Reflex to ID Panel     Status: None   Collection Time: 02/15/20  1:24 PM   Specimen: BLOOD  Result Value Ref Range Status   Specimen Description BLOOD BLOOD LEFT HAND  Final   Special Requests   Final    BOTTLES DRAWN AEROBIC AND ANAEROBIC Blood Culture adequate volume   Culture   Final    NO GROWTH 5 DAYS Performed at [facility], [address] [place], [place] [address]    Report Status 02/20/2020 FINAL  Final    Radiology Reports CT Head Wo Contrast  Result Date: 02/09/2020 CLINICAL DATA:  Trauma, fall on blood thinners EXAM: CT HEAD WITHOUT CONTRAST CT CERVICAL SPINE WITHOUT CONTRAST TECHNIQUE: Multidetector CT imaging of the head and cervical spine was performed following the standard protocol without intravenous contrast. Multiplanar CT image reconstructions of the cervical spine were also generated. COMPARISON:  None. FINDINGS: CT HEAD FINDINGS Brain: No acute territorial infarction, hemorrhage, or intracranial mass. Moderate atrophy. Mild hypodensity in the white matter consistent with chronic small vessel ischemic change. The ventricles are nonenlarged. Vascular: No hyperdense vessels.  Carotid vascular calcification. Skull: No depressed skull fracture. Sinuses/Orbits: Mucosal thickening in the maxillary and ethmoid sinuses. Small fluid level in the left  sphenoid sinus but no central skull base lucency is seen. Possible chronic left nasal bone fracture. Other: Mild right posterior scalp swelling. CT CERVICAL SPINE FINDINGS Alignment: No subluxation.  Facet alignment within normal limits. Skull base and vertebrae: No acute fracture. No primary bone lesion or focal pathologic process. Soft tissues and spinal canal: No prevertebral fluid or swelling. No visible canal hematoma. Disc levels: Multiple level degenerative change with moderate severe disease at C5-C6 and C6-C7. Facet degenerative changes at multiple levels with foraminal narrowing. Upper chest: Apical scarring Other: None IMPRESSION: 1. No CT evidence for acute intracranial abnormality. Atrophy and mild chronic small vessel ischemic changes of the white matter. 2. Degenerative changes of the cervical spine. No acute osseous abnormality. Electronically Signed   By: Jasmine Pang M.D.   On: 02/09/2020 01:13   CT ANGIO CHEST PE W OR WO CONTRAST  Result Date: 02/16/2020 CLINICAL DATA:  COVID positive peer EXAM: CT ANGIOGRAPHY CHEST WITH CONTRAST TECHNIQUE: Multidetector CT imaging of the chest was performed using the standard protocol during bolus administration of intravenous contrast. Multiplanar CT image reconstructions and MIPs were obtained to evaluate the vascular anatomy. CONTRAST:  OMNIPAQUE IOHEXOL 350 MG/ML SOLN COMPARISON:  Oct 22, 2018 FINDINGS: Cardiovascular: There is moderate severity calcification of the thoracic aorta. Satisfactory opacification of the pulmonary arteries to the segmental level. No evidence of pulmonary embolism. Normal heart size. No pericardial effusion. Mediastinum/Nodes: No enlarged mediastinal, hilar, or axillary lymph nodes. Thyroid gland, trachea, and esophagus demonstrate no significant findings. Lungs/Pleura: Marked severity patchy and ground-glass appearing infiltrates are seen throughout both lungs. There is no evidence of a pleural effusion or pneumothorax.  Upper Abdomen: Numerous subcentimeter gallstones are seen within the lumen of an otherwise normal-appearing gallbladder. Musculoskeletal: No chest wall abnormality. No acute or significant osseous findings. Review of the MIP images confirms the above findings. IMPRESSION: 1. Marked severity bilateral patchy and ground-glass appearing infiltrates. 2. Cholelithiasis. 3. Aortic atherosclerosis. Aortic Atherosclerosis (ICD10-I70.0). Electronically Signed   By: Aram Candela M.D.   On: 02/16/2020 22:13   CT Cervical Spine Wo Contrast  Result Date: 02/09/2020 CLINICAL DATA:  Trauma, fall on blood thinners  EXAM: CT HEAD WITHOUT CONTRAST CT CERVICAL SPINE WITHOUT CONTRAST TECHNIQUE: Multidetector CT imaging of the head and cervical spine was performed following the standard protocol without intravenous contrast. Multiplanar CT image reconstructions of the cervical spine were also generated. COMPARISON:  None. FINDINGS: CT HEAD FINDINGS Brain: No acute territorial infarction, hemorrhage, or intracranial mass. Moderate atrophy. Mild hypodensity in the white matter consistent with chronic small vessel ischemic change. The ventricles are nonenlarged. Vascular: No hyperdense vessels.  Carotid vascular calcification. Skull: No depressed skull fracture. Sinuses/Orbits: Mucosal thickening in the maxillary and ethmoid sinuses. Small fluid level in the left sphenoid sinus but no central skull base lucency is seen. Possible chronic left nasal bone fracture. Other: Mild right posterior scalp swelling. CT CERVICAL SPINE FINDINGS Alignment: No subluxation.  Facet alignment within normal limits. Skull base and vertebrae: No acute fracture. No primary bone lesion or focal pathologic process. Soft tissues and spinal canal: No prevertebral fluid or swelling. No visible canal hematoma. Disc levels: Multiple level degenerative change with moderate severe disease at C5-C6 and C6-C7. Facet degenerative changes at multiple levels with  foraminal narrowing. Upper chest: Apical scarring Other: None IMPRESSION: 1. No CT evidence for acute intracranial abnormality. Atrophy and mild chronic small vessel ischemic changes of the white matter. 2. Degenerative changes of the cervical spine. No acute osseous abnormality. Electronically Signed   By: Jasmine Pang M.D.   On: 02/09/2020 01:13   DG Chest Port 1 View  Result Date: 02/15/2020 CLINICAL DATA:  COVID positive.  Hypoxia. EXAM: PORTABLE CHEST 1 VIEW COMPARISON:  Oct 22, 2018 FINDINGS: Diffuse bilateral interstitial and airspace opacities, new from prior. No visible pleural effusion. No pneumothorax. Cardiac silhouette is accentuated by low lung volumes and portable technique. Aortic atherosclerosis. IMPRESSION: Diffuse bilateral interstitial and airspace opacities, likely multifocal pneumonia given the clinical history. Electronically Signed   By: Feliberto Harts MD   On: 02/15/2020 10:56   VAS [place] LOWER EXTREMITY VENOUS (DVT)  Result Date: 02/16/2020  Lower Venous DVTStudy Indications: Swelling, and elevated d dimer.  Risk Factors: + covid. Performing Technologist: Jannet Askew RCT RDMS  Examination Guidelines: A complete evaluation includes B-mode imaging, spectral Doppler, color Doppler, and power Doppler as needed of all accessible portions of each vessel. Bilateral testing is considered an integral part of a complete examination. Limited examinations for reoccurring indications may be performed as noted. The reflux portion of the exam is performed with the patient in reverse Trendelenburg.  +---------+---------------+---------+-----------+----------+--------------+ RIGHT    CompressibilityPhasicitySpontaneityPropertiesThrombus Aging +---------+---------------+---------+-----------+----------+--------------+ CFV      Full           Yes      Yes                                 +---------+---------------+---------+-----------+----------+--------------+ SFJ      Full                                                         +---------+---------------+---------+-----------+----------+--------------+ FV Prox  Full                                                        +---------+---------------+---------+-----------+----------+--------------+  FV Mid   Full                                                        +---------+---------------+---------+-----------+----------+--------------+ FV DistalFull                                                        +---------+---------------+---------+-----------+----------+--------------+ PFV      Full                                                        +---------+---------------+---------+-----------+----------+--------------+ POP      Full           Yes      Yes                                 +---------+---------------+---------+-----------+----------+--------------+ PTV      Full                                                        +---------+---------------+---------+-----------+----------+--------------+ PERO     Full                                                        +---------+---------------+---------+-----------+----------+--------------+   +---------+---------------+---------+-----------+----------+--------------+ LEFT     CompressibilityPhasicitySpontaneityPropertiesThrombus Aging +---------+---------------+---------+-----------+----------+--------------+ CFV      Full           Yes      Yes                                 +---------+---------------+---------+-----------+----------+--------------+ SFJ      Full                                                        +---------+---------------+---------+-----------+----------+--------------+ FV Prox  Full                                                        +---------+---------------+---------+-----------+----------+--------------+ FV Mid   Full                                                         +---------+---------------+---------+-----------+----------+--------------+  FV DistalFull                                                        +---------+---------------+---------+-----------+----------+--------------+ PFV      Full                                                        +---------+---------------+---------+-----------+----------+--------------+ POP      Full           Yes      Yes                                 +---------+---------------+---------+-----------+----------+--------------+ PTV      Full                                                        +---------+---------------+---------+-----------+----------+--------------+ PERO     Full                                                        +---------+---------------+---------+-----------+----------+--------------+     Summary: RIGHT: - There is no evidence of deep vein thrombosis in the lower extremity.  - No cystic structure found in the popliteal fossa.  LEFT: - There is no evidence of deep vein thrombosis in the lower extremity.  - No cystic structure found in the popliteal fossa.  *See table(s) above for measurements and observations. Electronically signed by Coral Else MD on 02/16/2020 at 9:33:24 PM.    Final    ECHOCARDIOGRAM LIMITED  Result Date: 02/16/2020    ECHOCARDIOGRAM LIMITED REPORT   Patient Name:   JEZIEL HOFFMANN Date of Exam: 02/16/2020 Medical Rec #:  [id number]       Height:       72.0 in Accession #:    [id number]      Weight:       183.0 lb Date of Birth:  1935/01/29       BSA:          2.052 m Patient Age:    84 years        BP:           114/53 mmHg Patient Gender: M               HR:           77 bpm. Exam Location:  Inpatient Procedure: Limited Echo, Cardiac Doppler and Color Doppler Indications:    Elevated Troponin  History:        Patient has prior history of Echocardiogram examinations, most                 recent 02/17/2019. CHF and Cardiomyopathy, CAD,  Arrythmias:Atrial  Fibrillation, Signs/Symptoms:Shortness of Breath and Fever; Risk                 Factors:Hypertension, Dyslipidemia, OBESITY and Diabetes.                 COVID+.  Sonographer:    Lavenia Atlas Referring Phys: [id number] Kasandra Knudsen PAHWANI IMPRESSIONS  1. Left ventricular ejection fraction, by estimation, is 55 to 60%.  2. Right ventricular systolic function is normal. The right ventricular size is normal. There is normal pulmonary artery systolic pressure.  3. The interatrial septum is aneuyrsmal with frequent bowing from right to left.  4. The mitral valve is normal in structure. Trivial mitral valve regurgitation.  5. There is mild calcification of the aortic valve. There is mild thickening of the aortic valve. Mild aortic valve sclerosis is present, with no evidence of aortic valve stenosis. Comparison(s): Compared to prior echo on 01/2019, the LVEF appears mildly improved to 55-60%. FINDINGS  Left Ventricle: Septal motion is abnormal consistent with prior surgery. Left ventricular ejection fraction, by estimation, is 55 to 60%. The left ventricular internal cavity size was normal in size. There is no left ventricular hypertrophy. Right Ventricle: The right ventricular size is normal. Right ventricular systolic function is normal. There is normal pulmonary artery systolic pressure. The tricuspid regurgitant velocity is 2.41 m/s, and with an assumed right atrial pressure of 3 mmHg,  the estimated right ventricular systolic pressure is 26.2 mmHg. Left Atrium: Left atrial size was normal in size. Pericardium: There is no evidence of pericardial effusion. Mitral Valve: The mitral valve is normal in structure. There is mild thickening of the mitral valve leaflet(s). Mild mitral annular calcification. Trivial mitral valve regurgitation. Tricuspid Valve: The tricuspid valve is normal in structure. Tricuspid valve regurgitation is mild. Aortic Valve: There is mild calcification of the  aortic valve. There is mild thickening of the aortic valve. Mild aortic valve sclerosis is present, with no evidence of aortic valve stenosis. Pulmonic Valve: The pulmonic valve was not assessed. Aorta: The aortic root is normal in size and structure. LEFT VENTRICLE PLAX 2D LVIDd:         3.80 cm  Diastology LVIDs:         2.70 cm  LV e' medial:    7.51 cm/s LV PW:         1.20 cm  LV E/e' medial:  7.7 LV IVS:        0.90 cm  LV e' lateral:   5.98 cm/s LVOT diam:     1.90 cm  LV E/e' lateral: 9.7 LVOT Area:     2.84 cm  RIGHT VENTRICLE RV S prime:     13.30 cm/s LEFT ATRIUM         Index LA diam:    2.80 cm 1.36 cm/m   AORTA Ao Root diam: 2.50 cm MITRAL VALVE               TRICUSPID VALVE MV Area (PHT): 5.38 cm    TR Peak grad:   23.2 mmHg MV Decel Time: 141 msec    TR Vmax:        241.00 cm/s MV E velocity: 57.80 cm/s MV A velocity: 70.30 cm/s  SHUNTS MV E/A ratio:  0.82        Systemic Diam: 1.90 cm Laurance Flatten MD Electronically signed by Laurance Flatten MD Signature Date/Time: 02/16/2020/4:23:30 PM    Final    ABORTED INVASIVE LAB PROCEDURE  Result Date: 02/15/2020  This case was aborted.  US Abdomen Limited RUQ  Result Date: 02/15/2020 CLINICAL DATA:  Elevated liver enzymes. EXAM: ULTRASOUND ABDOMEN LIMITED RIGHT UPPER QUADRANT COMPARISON:  CT abdomen 07/06/2019. FINDINGS: Gallbladder: Gallstones measuring up to 7 mm noted. Gallbladder wall thickness normal. No Murphy sign. Common bile duct: Diameter: Not visualized due to overlying bowel gas. Liver: Left no not visualized due to overlying bowel gas. No focal lesion identified. Within normal limits in parenchymal echogenicity. Portal vein is patent on color Doppler imaging with normal direction of blood flow towards the liver. Other: Limited study due to overlying bowel gas. IMPRESSION: 1.  Multiple small gallstones.  No gallbladder wall thickening. 2. Limited exam due to overlying bowel gas. Common bile duct not visualized. Portions of the  liver not visualized. Electronically Signed   By: Maisie Fus  Register   On: 02/15/2020 15:35     Huey Bienenstock M.D on 02/20/2020 at 3:45 PM    [facility] -  Office  [phone]

## 2020-02-21 LAB — CBC
HCT: 38.8 % — ABNORMAL LOW (ref 39.0–52.0)
Hemoglobin: 13 g/dL (ref 13.0–17.0)
MCH: 31.3 pg (ref 26.0–34.0)
MCHC: 33.5 g/dL (ref 30.0–36.0)
MCV: 93.3 fL (ref 80.0–100.0)
Platelets: 237 10*3/uL (ref 150–400)
RBC: 4.16 MIL/uL — ABNORMAL LOW (ref 4.22–5.81)
RDW: 13.2 % (ref 11.5–15.5)
WBC: 11.4 10*3/uL — ABNORMAL HIGH (ref 4.0–10.5)
nRBC: 0 % (ref 0.0–0.2)

## 2020-02-21 MED ORDER — INSULIN DETEMIR 100 UNIT/ML ~~LOC~~ SOLN
10.0000 [IU] | Freq: Every day | SUBCUTANEOUS | Status: DC
  Administered 2020-02-21 – 2020-02-22 (×2): 10 [IU] via SUBCUTANEOUS
  Filled 2020-02-21 (×2): qty 0.1

## 2020-02-21 NOTE — Progress Notes (Signed)
PROGRESS NOTE                                                                                                                                                                                                             Patient Demographics:    Gordon Novak, is a 85 y.o. male, DOB - 04-15-1935, HBZ:[id number]  Admit date - 02/15/2020   Admitting Physician Ollen Bowl, MD  Outpatient Primary MD for the patient is [name], MD  LOS - 6   No chief complaint on file.      Brief Narrative     85 y.o. male with medical history significant of coronary artery disease status post PCI in 2020, hypertension, hyperlipidemia, type 2 diabetes mellitus, PE-not on anticoagulation, presents to emergency department for evaluation of hypoxia.  Patient tested positive for COVID-19 on 9/13.  He is fully vaccinated against COVID-19 with [employer].  He began having COVID-19 symptoms 10 days ago such as fever, cough, shortness of breath.  He was evaluated on 9/15 for a fall however at that time he was maintaining oxygen saturation on room air and he had mild symptoms at that time therefore he was discharged home.  Patient reports worsening shortness of breath therefore he called EMS and was found to have oxygen saturation 68% on room air.  He was placed on nonrebreather during transport and began having some chest pain.  EKG showed STEMI inferior leadstherefore code STEMI was called.  He received aspirin prior to arrival.  Repeat EKG shows no ST EMI.  His initial troponin came back negative.  He was started on NTG and heparin drip.  Patient denies chest pain.  Cardiology was consulted who recommended to hold off catheterization for now as patient's chest pain and EKG improved.  His symptoms were likely secondary to hypoxia.  Upon my evaluation: Patient on high flow nasal cannula, not in acute respiratory distress, communicating well, alert and oriented x3.  Reports that  he is feeling much better.  Reports some cough and shortness of breath however denies chest pain, wheezing, fever, chills, nausea, vomiting, abdominal pain, diarrhea, urinary changes.  He has chronic left lower extremity swelling since 2007.  He denies pain or redness.  He denies use of smoking, alcohol, listed drug use.  He wants to remain full code.  ED Course: Upon  arrival to ED: Patient was tachypneic, hypoxic requiring high flow nasal cannula, CBC shows leukocytosis of 11.5, troponin x1 -, PT APTT: WNL, BNP: 199, CMP shows AST of 195, ALT: 216, alkaline phosphatase: 144.  Chest x-ray shows multifocal pneumonia.  A1c: 7.1.  Patient started on heparin and NTG drip in ED.  Cardiology consulted.  [facility] hospitalist consulted for admission for acute hypoxemic respiratory failure secondary to COVID-19 pneumonia.    Subjective:    Tyleek Smick today denies any complaints, reports good night sleep, dyspnea at baseline, report cough has improved.    Assessment  & Plan :    Principal Problem:   Acute hypoxemic respiratory failure due to COVID-19 [facility]) Active Problems:   Type 2 diabetes mellitus (HCC)   Elevated liver enzymes   Essential hypertension   Acute hypoxemic respiratory failure secondary to COVID-19 pneumonia: -Patient is fully vaccinated against Covid, this is breakthrough COVID-19 infection. -Patient appears to be with severe COVID-19 for pneumonia, with severe lung parenchymal injury due to COVID-19 pneumonia, he is with significant oxygen requirement, this morning he remains on 20 L heated high flow nasal cannula, but we did require to go up to 35 L upon minimal activity this morning . - Continue with IV steroids. -Treated with IV remdesivir -Continue with baricitinib. -Continue to trend inflammatory markers specially with elevated CRP and D-dimers. -Patient is on empiric coverage for bacterial pneumonia on IV Rocephin and azithromycin given elevated procalcitonin of 2.2 on  admission . -Patient encouraged to use incentive spirometry, flutter valve. -Blood pressure is soft this morning, will hold on giving any extra IV Lasix today, continue with home dose at 20 mg p.o. daily.  SpO2: 95 % O2 Flow Rate (L/min): (S) 20 L/min FiO2 (%): 100 %  COVID-19 Labs  Recent Labs    02/19/20 0226 02/20/20 0338  DDIMER 1.78* 1.60*  FERRITIN 3,725* 3,195*  CRP 2.9* 2.0*    Lab Results  Component Value Date   SARSCOV2NAA NEGATIVE 10/22/2018   SARSCOV2NAA NEGATIVE 10/18/2018     Elevated troponins -Most likely due to demand ischemia from his hypoxia. -Patient has history of coronary artery disease status post PCI. -Cardiology input greatly appreciated, this is most likely in setting of demand ischemia, GI evaluation indicated at this point. -Continue with aspirin, metoprolol and statin.   Elevated d dimer:  -in the setting of covid 19.  But as well high risk for VTE especially in the setting of bilateral PE during during previous hospitalization. -Initially on heparin GTT >> full dose subcu Lovenox, venous Dopplers and CTA with no evidence of DVT or PE.  Lowered Lovenox to 0.5 mg/kg every 12 hours  Elevated liver enzymes: -To COVID-19 infection, continue to trend.  Trending down slowly. -Right upper quadrant ultrasound significant for gallstone, but no evidence of acute process.  Hypertension: Stable -Continue metoprolol.  Monitor blood pressure closely.  Type 2 diabetes mellitus: A1c 7.1%.  -  Hold Metformin for now.  -CBGs are mildly elevated in the setting of steroid use, I have started him on Tradjenta and low-dose Levemir which has been uptitrated gradually.   Chronic systolic CHF/ischemic cardiomyopathy: Patient appears euvolemic on exam. -Reviewed echo from 02/17/2019 which showed ejection fraction of 50 to 55%. -Continue Lasix.  Strict INO's and daily weight.  Monitor signs of fluid overload.  Check electrolytes. -Continue statin,  metoprolol.  Gout: Continue allopurinol  BPH: Continue Flomax  History of PE: Was on Xarelto for 1 year.    Chronic left lower  extremity swelling: Patient tells me that he has chronic left lower extremity after a kidney stone removal procedure?  Back in 2007.  Hyperlipidemia: -Continue statin   COVID-19 Labs  Recent Labs    02/19/20 0226 02/20/20 0338  DDIMER 1.78* 1.60*  FERRITIN 3,725* 3,195*  CRP 2.9* 2.0*    Lab Results  Component Value Date   SARSCOV2NAA NEGATIVE 10/22/2018   SARSCOV2NAA NEGATIVE 10/18/2018     Code Status : Full  Family Communication  : daughter [name]  updated by Phone 9/27  Disposition Plan  :  Status is: Inpatient  Remains inpatient appropriate because:Hemodynamically unstable, Unsafe d/c plan and IV treatments appropriate due to intensity of illness or inability to take PO   Dispo: The patient is from: Home              Anticipated d/c is to: Home              Anticipated d/c date is: > 3 days              Patient currently is not medically stable to d/c.     Consults  :  cardiology  Procedures  : none  DVT Prophylaxis  :  [place] lovenox  Lab Results  Component Value Date   PLT 237 02/21/2020    Antibiotics  :   Anti-infectives (From admission, onward)   Start     Dose/Rate Route Frequency Ordered Stop   02/16/20 1330  azithromycin (ZITHROMAX) 500 mg in sodium chloride 0.9 % 250 mL IVPB        500 mg 250 mL/hr over 60 Minutes Intravenous Every 24 hours 02/16/20 1237 02/20/20 1320   02/16/20 1300  cefTRIAXone (ROCEPHIN) 2 g in sodium chloride 0.9 % 100 mL IVPB        2 g 200 mL/hr over 30 Minutes Intravenous Every 24 hours 02/16/20 1237 02/20/20 1252   02/16/20 1000  remdesivir 100 mg in sodium chloride 0.9 % 100 mL IVPB       "Followed by" Linked Group Details   100 mg 200 mL/hr over 30 Minutes Intravenous Daily 02/15/20 1301 02/19/20 0855   02/15/20 1400  remdesivir 200 mg in sodium chloride 0.9% 250 mL IVPB        "Followed by" Linked Group Details   200 mg 580 mL/hr over 30 Minutes Intravenous Once 02/15/20 1301 02/15/20 1504        Objective:   Vitals:   02/21/20 0003 02/21/20 0416 02/21/20 0753 02/21/20 1146  BP: (!) 114/53 (!) 105/54 104/60 (!) 101/49  Pulse: 66 62  69  Resp: Temp: 98.3 F (36.8 C) 97.6 F (36.4 C) 98.7 F (37.1 C) 97.8 F (36.6 C)  TempSrc: Axillary Oral Oral Oral  SpO2: 91% 91% 91% 95%  Weight:      Height:        Wt Readings from Last 3 Encounters:  02/15/20 83 kg  02/09/20 83.9 kg  08/30/19 83 kg     Intake/Output Summary (Last 24 hours) at 02/21/2020 1544 Last data filed at 02/21/2020 1023 Gross per 24 hour  Intake 120 ml  Output 550 ml  Net -430 ml     Physical Exam   Awake Alert, Oriented X 3, No new F.N deficits, Normal affect Symmetrical Chest wall movement, Good air movement bilaterally, no wheezing RRR,No Gallops,Rubs or new Murmurs, No Parasternal Heave +ve B.Sounds, Abd Soft, No tenderness, No rebound - guarding or rigidity. No Cyanosis,  Clubbing or edema, No new Rash or bruise      Data Review:    CBC Recent Labs  Lab 02/16/20 0527 02/16/20 0527 02/17/20 1136 02/18/20 0358 02/19/20 0226 02/20/20 0338 02/21/20 0458  WBC 10.5   < > 12.4* 11.2* 8.1 8.9 11.4*  HGB 14.0   < > 15.4 13.6 13.2 13.1 13.0  HCT 41.7   < > 45.7 39.9 39.5 38.7* 38.8*  PLT 245   < > 290 275 250 252 237  MCV 92.1   < > 92.7 92.6 92.7 93.3 93.3  MCH 30.9   < > 31.2 31.6 31.0 31.6 31.3  MCHC 33.6   < > 33.7 34.1 33.4 33.9 33.5  RDW 13.3   < > 13.5 13.4 13.3 13.4 13.2  LYMPHSABS 0.9  --  0.8 0.6* 0.3* 0.2*  --   MONOABS 0.4  --  0.4 0.3 0.2 0.2  --   EOSABS 0.0  --  0.0 0.0 0.0 0.0  --   BASOSABS 0.0  --  0.0 0.0 0.0 0.0  --    < > = values in this interval not displayed.    Chemistries  Recent Labs  Lab 02/16/20 0527 02/17/20 1136 02/18/20 0358 02/19/20 0226 02/20/20 0338  NA 140 141 141 139 139  K 4.6 4.1 4.4 4.5 4.5  CL 107  110 108 108 107  CO2 22 18* GLUCOSE 162* 252* 214* 191* 211*  BUN 26* 33* 34* 36* 35*  CREATININE 0.96 1.07 0.96 0.95 0.92  CALCIUM 8.0* 8.2* 8.0* 7.7* 7.8*  MG 2.3 2.5* 2.7* 2.5* 2.7*  AST 82* 63* 82* 73* 58*  ALT 134* 114* 119* 117* 109*  ALKPHOS 110 107 96 101 93  BILITOT 1.1 0.8 0.6 0.5 0.8   ------------------------------------------------------------------------------------------------------------------ No results for input(s): CHOL, HDL, LDLCALC, TRIG, CHOLHDL, LDLDIRECT in the last 72 hours.  Lab Results  Component Value Date   HGBA1C 7.1 (H) 02/15/2020   ------------------------------------------------------------------------------------------------------------------ No results for input(s): TSH, T4TOTAL, T3FREE, THYROIDAB in the last 72 hours.  Invalid input(s): FREET3 ------------------------------------------------------------------------------------------------------------------ Recent Labs    02/19/20 0226 02/20/20 0338  FERRITIN 3,725* 3,195*    Coagulation profile Recent Labs  Lab 02/15/20 1022  INR 1.2    Recent Labs    02/19/20 0226 02/20/20 0338  DDIMER 1.78* 1.60*    Cardiac Enzymes No results for input(s): CKMB, TROPONINI, MYOGLOBIN in the last 168 hours.  Invalid input(s): CK ------------------------------------------------------------------------------------------------------------------    Component Value Date/Time   BNP 199.1 (H) 02/15/2020 1040    Inpatient Medications  Scheduled Meds: . allopurinol  300 mg Oral Daily  . vitamin C  500 mg Oral Daily  . aspirin EC  81 mg Oral Daily  . baricitinib  4 mg Oral Daily  . enoxaparin (LOVENOX) injection  40 mg Subcutaneous BID  . furosemide  20 mg Oral Daily  . insulin aspart  0-15 Units Subcutaneous TID WC  . insulin aspart  0-5 Units Subcutaneous QHS  . insulin detemir  10 Units Subcutaneous Daily  . linagliptin  5 mg Oral Daily  . methylPREDNISolone (SOLU-MEDROL)  injection  80 mg Intravenous Q12H  . metoprolol succinate  12.5 mg Oral QHS  . pantoprazole  40 mg Oral Daily  . rosuvastatin  10 mg Oral Daily  . tamsulosin  0.4 mg Oral QHS  . zinc sulfate  220 mg Oral Daily   Continuous Infusions: . sodium chloride 10 mL/hr at 02/17/20 0500   PRN  Meds:.acetaminophen, chlorpheniramine-HYDROcodone, guaiFENesin-dextromethorphan, ondansetron **OR** ondansetron (ZOFRAN) IV  Micro Results Recent Results (from the past 240 hour(s))  Culture, blood (Routine X 2) w Reflex to ID Panel     Status: None   Collection Time: 02/15/20  1:24 PM   Specimen: BLOOD  Result Value Ref Range Status   Specimen Description BLOOD RIGHT ANTECUBITAL  Final   Special Requests   Final    BOTTLES DRAWN AEROBIC AND ANAEROBIC Blood Culture adequate volume   Culture   Final    NO GROWTH 5 DAYS Performed at [facility], [address], [place], [place] [address]    Report Status 02/20/2020 FINAL  Final  Culture, blood (Routine X 2) w Reflex to ID Panel     Status: None   Collection Time: 02/15/20  1:24 PM   Specimen: BLOOD  Result Value Ref Range Status   Specimen Description BLOOD BLOOD LEFT HAND  Final   Special Requests   Final    BOTTLES DRAWN AEROBIC AND ANAEROBIC Blood Culture adequate volume   Culture   Final    NO GROWTH 5 DAYS Performed at [facility], [address], [place], [place] [address]    Report Status 02/20/2020 FINAL  Final    Radiology Reports CT Head Wo Contrast  Result Date: 02/09/2020 CLINICAL DATA:  Trauma, fall on blood thinners EXAM: CT HEAD WITHOUT CONTRAST CT CERVICAL SPINE WITHOUT CONTRAST TECHNIQUE: Multidetector CT imaging of the head and cervical spine was performed following the standard protocol without intravenous contrast. Multiplanar CT image reconstructions of the cervical spine were also generated. COMPARISON:  None. FINDINGS: CT HEAD FINDINGS Brain: No acute territorial infarction, hemorrhage, or intracranial mass.  Moderate atrophy. Mild hypodensity in the white matter consistent with chronic small vessel ischemic change. The ventricles are nonenlarged. Vascular: No hyperdense vessels.  Carotid vascular calcification. Skull: No depressed skull fracture. Sinuses/Orbits: Mucosal thickening in the maxillary and ethmoid sinuses. Small fluid level in the left sphenoid sinus but no central skull base lucency is seen. Possible chronic left nasal bone fracture. Other: Mild right posterior scalp swelling. CT CERVICAL SPINE FINDINGS Alignment: No subluxation.  Facet alignment within normal limits. Skull base and vertebrae: No acute fracture. No primary bone lesion or focal pathologic process. Soft tissues and spinal canal: No prevertebral fluid or swelling. No visible canal hematoma. Disc levels: Multiple level degenerative change with moderate severe disease at C5-C6 and C6-C7. Facet degenerative changes at multiple levels with foraminal narrowing. Upper chest: Apical scarring Other: None IMPRESSION: 1. No CT evidence for acute intracranial abnormality. Atrophy and mild chronic small vessel ischemic changes of the white matter. 2. Degenerative changes of the cervical spine. No acute osseous abnormality. Electronically Signed   By: Jasmine Pang M.D.   On: 02/09/2020 01:13   CT ANGIO CHEST PE W OR WO CONTRAST  Result Date: 02/16/2020 CLINICAL DATA:  COVID positive peer EXAM: CT ANGIOGRAPHY CHEST WITH CONTRAST TECHNIQUE: Multidetector CT imaging of the chest was performed using the standard protocol during bolus administration of intravenous contrast. Multiplanar CT image reconstructions and MIPs were obtained to evaluate the vascular anatomy. CONTRAST:  OMNIPAQUE IOHEXOL 350 MG/ML SOLN COMPARISON:  Oct 22, 2018 FINDINGS: Cardiovascular: There is moderate severity calcification of the thoracic aorta. Satisfactory opacification of the pulmonary arteries to the segmental level. No evidence of pulmonary embolism. Normal heart  size. No pericardial effusion. Mediastinum/Nodes: No enlarged mediastinal, hilar, or axillary lymph nodes. Thyroid gland, trachea, and esophagus demonstrate no significant  findings. Lungs/Pleura: Marked severity patchy and ground-glass appearing infiltrates are seen throughout both lungs. There is no evidence of a pleural effusion or pneumothorax. Upper Abdomen: Numerous subcentimeter gallstones are seen within the lumen of an otherwise normal-appearing gallbladder. Musculoskeletal: No chest wall abnormality. No acute or significant osseous findings. Review of the MIP images confirms the above findings. IMPRESSION: 1. Marked severity bilateral patchy and ground-glass appearing infiltrates. 2. Cholelithiasis. 3. Aortic atherosclerosis. Aortic Atherosclerosis (ICD10-I70.0). Electronically Signed   By: Aram Candela M.D.   On: 02/16/2020 22:13   CT Cervical Spine Wo Contrast  Result Date: 02/09/2020 CLINICAL DATA:  Trauma, fall on blood thinners EXAM: CT HEAD WITHOUT CONTRAST CT CERVICAL SPINE WITHOUT CONTRAST TECHNIQUE: Multidetector CT imaging of the head and cervical spine was performed following the standard protocol without intravenous contrast. Multiplanar CT image reconstructions of the cervical spine were also generated. COMPARISON:  None. FINDINGS: CT HEAD FINDINGS Brain: No acute territorial infarction, hemorrhage, or intracranial mass. Moderate atrophy. Mild hypodensity in the white matter consistent with chronic small vessel ischemic change. The ventricles are nonenlarged. Vascular: No hyperdense vessels.  Carotid vascular calcification. Skull: No depressed skull fracture. Sinuses/Orbits: Mucosal thickening in the maxillary and ethmoid sinuses. Small fluid level in the left sphenoid sinus but no central skull base lucency is seen. Possible chronic left nasal bone fracture. Other: Mild right posterior scalp swelling. CT CERVICAL SPINE FINDINGS Alignment: No subluxation.  Facet alignment within normal  limits. Skull base and vertebrae: No acute fracture. No primary bone lesion or focal pathologic process. Soft tissues and spinal canal: No prevertebral fluid or swelling. No visible canal hematoma. Disc levels: Multiple level degenerative change with moderate severe disease at C5-C6 and C6-C7. Facet degenerative changes at multiple levels with foraminal narrowing. Upper chest: Apical scarring Other: None IMPRESSION: 1. No CT evidence for acute intracranial abnormality. Atrophy and mild chronic small vessel ischemic changes of the white matter. 2. Degenerative changes of the cervical spine. No acute osseous abnormality. Electronically Signed   By: Jasmine Pang M.D.   On: 02/09/2020 01:13   DG Chest Port 1 View  Result Date: 02/15/2020 CLINICAL DATA:  COVID positive.  Hypoxia. EXAM: PORTABLE CHEST 1 VIEW COMPARISON:  Oct 22, 2018 FINDINGS: Diffuse bilateral interstitial and airspace opacities, new from prior. No visible pleural effusion. No pneumothorax. Cardiac silhouette is accentuated by low lung volumes and portable technique. Aortic atherosclerosis. IMPRESSION: Diffuse bilateral interstitial and airspace opacities, likely multifocal pneumonia given the clinical history. Electronically Signed   By: Feliberto Harts MD   On: 02/15/2020 10:56   VAS [place] LOWER EXTREMITY VENOUS (DVT)  Result Date: 02/16/2020  Lower Venous DVTStudy Indications: Swelling, and elevated d dimer.  Risk Factors: + covid. Performing Technologist: Jannet Askew RCT RDMS  Examination Guidelines: A complete evaluation includes B-mode imaging, spectral Doppler, color Doppler, and power Doppler as needed of all accessible portions of each vessel. Bilateral testing is considered an integral part of a complete examination. Limited examinations for reoccurring indications may be performed as noted. The reflux portion of the exam is performed with the patient in reverse Trendelenburg.   +---------+---------------+---------+-----------+----------+--------------+ RIGHT    CompressibilityPhasicitySpontaneityPropertiesThrombus Aging +---------+---------------+---------+-----------+----------+--------------+ CFV      Full           Yes      Yes                                 +---------+---------------+---------+-----------+----------+--------------+ SFJ  Full                                                        +---------+---------------+---------+-----------+----------+--------------+ FV Prox  Full                                                        +---------+---------------+---------+-----------+----------+--------------+ FV Mid   Full                                                        +---------+---------------+---------+-----------+----------+--------------+ FV DistalFull                                                        +---------+---------------+---------+-----------+----------+--------------+ PFV      Full                                                        +---------+---------------+---------+-----------+----------+--------------+ POP      Full           Yes      Yes                                 +---------+---------------+---------+-----------+----------+--------------+ PTV      Full                                                        +---------+---------------+---------+-----------+----------+--------------+ PERO     Full                                                        +---------+---------------+---------+-----------+----------+--------------+   +---------+---------------+---------+-----------+----------+--------------+ LEFT     CompressibilityPhasicitySpontaneityPropertiesThrombus Aging +---------+---------------+---------+-----------+----------+--------------+ CFV      Full           Yes      Yes                                  +---------+---------------+---------+-----------+----------+--------------+ SFJ      Full                                                        +---------+---------------+---------+-----------+----------+--------------+  FV Prox  Full                                                        +---------+---------------+---------+-----------+----------+--------------+ FV Mid   Full                                                        +---------+---------------+---------+-----------+----------+--------------+ FV DistalFull                                                        +---------+---------------+---------+-----------+----------+--------------+ PFV      Full                                                        +---------+---------------+---------+-----------+----------+--------------+ POP      Full           Yes      Yes                                 +---------+---------------+---------+-----------+----------+--------------+ PTV      Full                                                        +---------+---------------+---------+-----------+----------+--------------+ PERO     Full                                                        +---------+---------------+---------+-----------+----------+--------------+     Summary: RIGHT: - There is no evidence of deep vein thrombosis in the lower extremity.  - No cystic structure found in the popliteal fossa.  LEFT: - There is no evidence of deep vein thrombosis in the lower extremity.  - No cystic structure found in the popliteal fossa.  *See table(s) above for measurements and observations. Electronically signed by Coral Else MD on 02/16/2020 at 9:33:24 PM.    Final    ECHOCARDIOGRAM LIMITED  Result Date: 02/16/2020    ECHOCARDIOGRAM LIMITED REPORT   Patient Name:   DAEMIAN GAHM Date of Exam: 02/16/2020 Medical Rec #:  [id number]       Height:       72.0 in Accession #:    [id number]      Weight:        183.0 lb Date of Birth:  Jan 08, 1935       BSA:          2.052 m Patient Age:    4 years  BP:           114/53 mmHg Patient Gender: M               HR:           77 bpm. Exam Location:  Inpatient Procedure: Limited Echo, Cardiac Doppler and Color Doppler Indications:    Elevated Troponin  History:        Patient has prior history of Echocardiogram examinations, most                 recent 02/17/2019. CHF and Cardiomyopathy, CAD, Arrythmias:Atrial                 Fibrillation, Signs/Symptoms:Shortness of Breath and Fever; Risk                 Factors:Hypertension, Dyslipidemia, OBESITY and Diabetes.                 COVID+.  Sonographer:    Lavenia Atlas Referring Phys: [id number] Kasandra Knudsen PAHWANI IMPRESSIONS  1. Left ventricular ejection fraction, by estimation, is 55 to 60%.  2. Right ventricular systolic function is normal. The right ventricular size is normal. There is normal pulmonary artery systolic pressure.  3. The interatrial septum is aneuyrsmal with frequent bowing from right to left.  4. The mitral valve is normal in structure. Trivial mitral valve regurgitation.  5. There is mild calcification of the aortic valve. There is mild thickening of the aortic valve. Mild aortic valve sclerosis is present, with no evidence of aortic valve stenosis. Comparison(s): Compared to prior echo on 01/2019, the LVEF appears mildly improved to 55-60%. FINDINGS  Left Ventricle: Septal motion is abnormal consistent with prior surgery. Left ventricular ejection fraction, by estimation, is 55 to 60%. The left ventricular internal cavity size was normal in size. There is no left ventricular hypertrophy. Right Ventricle: The right ventricular size is normal. Right ventricular systolic function is normal. There is normal pulmonary artery systolic pressure. The tricuspid regurgitant velocity is 2.41 m/s, and with an assumed right atrial pressure of 3 mmHg,  the estimated right ventricular systolic pressure is 26.2 mmHg. Left  Atrium: Left atrial size was normal in size. Pericardium: There is no evidence of pericardial effusion. Mitral Valve: The mitral valve is normal in structure. There is mild thickening of the mitral valve leaflet(s). Mild mitral annular calcification. Trivial mitral valve regurgitation. Tricuspid Valve: The tricuspid valve is normal in structure. Tricuspid valve regurgitation is mild. Aortic Valve: There is mild calcification of the aortic valve. There is mild thickening of the aortic valve. Mild aortic valve sclerosis is present, with no evidence of aortic valve stenosis. Pulmonic Valve: The pulmonic valve was not assessed. Aorta: The aortic root is normal in size and structure. LEFT VENTRICLE PLAX 2D LVIDd:         3.80 cm  Diastology LVIDs:         2.70 cm  LV e' medial:    7.51 cm/s LV PW:         1.20 cm  LV E/e' medial:  7.7 LV IVS:        0.90 cm  LV e' lateral:   5.98 cm/s LVOT diam:     1.90 cm  LV E/e' lateral: 9.7 LVOT Area:     2.84 cm  RIGHT VENTRICLE RV S prime:     13.30 cm/s LEFT ATRIUM         Index LA diam:    2.80 cm 1.36  cm/m   AORTA Ao Root diam: 2.50 cm MITRAL VALVE               TRICUSPID VALVE MV Area (PHT): 5.38 cm    TR Peak grad:   23.2 mmHg MV Decel Time: 141 msec    TR Vmax:        241.00 cm/s MV E velocity: 57.80 cm/s MV A velocity: 70.30 cm/s  SHUNTS MV E/A ratio:  0.82        Systemic Diam: 1.90 cm Laurance FlattenHeather Pemberton MD Electronically signed by Laurance FlattenHeather Pemberton MD Signature Date/Time: 02/16/2020/4:23:30 PM    Final    ABORTED INVASIVE LAB PROCEDURE  Result Date: 02/15/2020 This case was aborted.  US Abdomen Limited RUQ  Result Date: 02/15/2020 CLINICAL DATA:  Elevated liver enzymes. EXAM: ULTRASOUND ABDOMEN LIMITED RIGHT UPPER QUADRANT COMPARISON:  CT abdomen 07/06/2019. FINDINGS: Gallbladder: Gallstones measuring up to 7 mm noted. Gallbladder wall thickness normal. No Murphy sign. Common bile duct: Diameter: Not visualized due to overlying bowel gas. Liver: Left no not  visualized due to overlying bowel gas. No focal lesion identified. Within normal limits in parenchymal echogenicity. Portal vein is patent on color Doppler imaging with normal direction of blood flow towards the liver. Other: Limited study due to overlying bowel gas. IMPRESSION: 1.  Multiple small gallstones.  No gallbladder wall thickening. 2. Limited exam due to overlying bowel gas. Common bile duct not visualized. Portions of the liver not visualized. Electronically Signed   By: Maisie Fushomas  Register   On: 02/15/2020 15:35     Huey Bienenstockawood Kailoni Vahle M.D on 02/21/2020 at 3:44 PM    [facility] -  Office  [phone]

## 2020-02-21 NOTE — Progress Notes (Signed)
Increased to 35L and 100% after an episode of coughing with desat. Will attempt to wean again later

## 2020-02-22 LAB — COMPREHENSIVE METABOLIC PANEL
ALT: 74 U/L — ABNORMAL HIGH (ref 0–44)
AST: 30 U/L (ref 15–41)
Albumin: 1.7 g/dL — ABNORMAL LOW (ref 3.5–5.0)
Alkaline Phosphatase: 83 U/L (ref 38–126)
Anion gap: 6 (ref 5–15)
BUN: 36 mg/dL — ABNORMAL HIGH (ref 8–23)
CO2: 28 mmol/L (ref 22–32)
Calcium: 7.9 mg/dL — ABNORMAL LOW (ref 8.9–10.3)
Chloride: 106 mmol/L (ref 98–111)
Creatinine, Ser: 0.92 mg/dL (ref 0.61–1.24)
GFR calc Af Amer: >60 mL/min (ref >60)
GFR calc non Af Amer: >60 mL/min (ref >60)
Glucose, Bld: 143 mg/dL — ABNORMAL HIGH (ref 70–99)
Potassium: 4.7 mmol/L (ref 3.5–5.1)
Sodium: 140 mmol/L (ref 135–145)
Total Bilirubin: 1 mg/dL (ref 0.3–1.2)
Total Protein: 4.5 g/dL — ABNORMAL LOW (ref 6.5–8.1)

## 2020-02-22 LAB — CBC
HCT: 39.8 % (ref 39.0–52.0)
Hemoglobin: 13.1 g/dL (ref 13.0–17.0)
MCH: 30.8 pg (ref 26.0–34.0)
MCHC: 32.9 g/dL (ref 30.0–36.0)
MCV: 93.6 fL (ref 80.0–100.0)
Platelets: 239 10*3/uL (ref 150–400)
RBC: 4.25 MIL/uL (ref 4.22–5.81)
RDW: 13.2 % (ref 11.5–15.5)
WBC: 13.1 10*3/uL — ABNORMAL HIGH (ref 4.0–10.5)
nRBC: 0 % (ref 0.0–0.2)

## 2020-02-22 MED ORDER — INSULIN DETEMIR 100 UNIT/ML ~~LOC~~ SOLN
5.0000 [IU] | Freq: Once | SUBCUTANEOUS | Status: AC
  Administered 2020-02-22: 5 [IU] via SUBCUTANEOUS
  Filled 2020-02-22: qty 0.05

## 2020-02-22 MED ORDER — FUROSEMIDE 10 MG/ML IJ SOLN
20.0000 mg | Freq: Once | INTRAMUSCULAR | Status: AC
  Administered 2020-02-22: 20 mg via INTRAVENOUS
  Filled 2020-02-22: qty 2

## 2020-02-22 MED ORDER — INSULIN DETEMIR 100 UNIT/ML ~~LOC~~ SOLN
15.0000 [IU] | Freq: Every day | SUBCUTANEOUS | Status: DC
  Administered 2020-02-23 – 2020-02-24 (×2): 15 [IU] via SUBCUTANEOUS
  Filled 2020-02-22 (×2): qty 0.15

## 2020-02-22 NOTE — Progress Notes (Addendum)
PROGRESS NOTE                                                                                                                                                                                                             Patient Demographics:    Gordon Novak, is a 85 y.o. male, DOB - [birth year], ZOX:[id number]  Admit date - 02/15/2020   Admitting Physician Ollen Bowl, MD  Outpatient Primary MD for the patient is [name], MD  LOS - 7   No chief complaint on file.      Brief Narrative     85 y.o. male with medical history significant of coronary artery disease status post PCI in 2020, hypertension, hyperlipidemia, type 2 diabetes mellitus, PE-not on anticoagulation, presents to emergency department for evaluation of hypoxia. tested positive for COVID-19 on 9/13.  He is fully vaccinated against COVID-19 with [employer].    Patient imaging significant for COVID-19 of pneumonia, he is with significant oxygen requirement on admission, requiring heated high flow and NRB together.   Subjective:    Akul Leggette today denies any nausea, vomiting or chest pain, reports cough and dyspnea has improved.  .    Assessment  & Plan :    Principal Problem:   Acute hypoxemic respiratory failure due to COVID-19 [facility]) Active Problems:   Type 2 diabetes mellitus (HCC)   Elevated liver enzymes   Essential hypertension   Acute hypoxemic respiratory failure secondary to COVID-19 pneumonia: -Patient is fully vaccinated against Covid, this is breakthrough COVID-19 infection. -Patient appears to be with severe COVID-19 for pneumonia, with severe lung parenchymal injury due to COVID-19 pneumonia, he is with significant oxygen requirement, but this is slowly improving, he is on heated high flow nasal cannula 25 L today, with NRB with activity. . . - Continue with IV steroids. -Treated with IV remdesivir -Continue with baricitinib. -Continue to trend inflammatory  markers specially with elevated CRP and D-dimers. -Treated with IV Rocephin and azithromycin given procalcitonin of 2.2 on admission. -Patient encouraged to use incentive spirometry, flutter valve. -He is on Lasix 20 mg oral daily, has been receiving IV Lasix intermittently as blood pressure allows, will give 20 mg of IV Lasix today.  SpO2: (!) 88 % O2 Flow Rate (L/min): 25 L/min FiO2 (%): 90 %  COVID-19 Labs  Recent Labs  02/20/20 0338  DDIMER 1.60*  FERRITIN 3,195*  CRP 2.0*    Lab Results  Component Value Date   SARSCOV2NAA NEGATIVE 10/22/2018   SARSCOV2NAA NEGATIVE 10/18/2018     Elevated troponins -Most likely due to demand ischemia from his hypoxia. -Patient has history of coronary artery disease status post PCI. -Cardiology input greatly appreciated, this is most likely in setting of demand ischemia, GI evaluation indicated at this point. -Continue with aspirin, metoprolol and statin.   Elevated d dimer:  -in the setting of covid 19.  But as well high risk for VTE especially in the setting of bilateral PE during during previous hospitalization. -Initially on heparin GTT, currently transitioned to full dose subcu Lovenox, venous Dopplers and CTA with no evidence of DVT or PE.  His Lovenox dose was lowered to 0.5 mg/kg every 12 hours.  Elevated liver enzymes: -To COVID-19 infection, continue to trend.  Trending down slowly. -Right upper quadrant ultrasound significant for gallstone, but no evidence of acute process.  Hypertension: Stable -Continue metoprolol.  Monitor blood pressure closely.  Type 2 diabetes mellitus: A1c 7.1%.  -  Hold Metformin for now.  -Started on Tradjenta. -Continue with insulin sliding scale  -CBG remains uncontrolled, started on Levemir, will increase up to 15 units today.  Chronic systolic CHF/ischemic cardiomyopathy: Patient appears euvolemic on exam. -Reviewed echo from 02/17/2019 which showed ejection fraction of 50 to  55%. -Continue Lasix.  Strict INO's and daily weight.  Monitor signs of fluid overload.  Check electrolytes. -Continue statin, metoprolol. -Continue with home dose of Lasix, receiving as needed IV Lasix as needed as well.  Gout: Continue allopurinol  BPH: Continue Flomax  History of PE: Was on Xarelto for 1 year.    Chronic left lower extremity swelling: Patient tells me that he has chronic left lower extremity after a kidney stone removal procedure?  Back in 2007.  Hyperlipidemia: -Continue statin   COVID-19 Labs  Recent Labs    02/20/20 0338  DDIMER 1.60*  FERRITIN 3,195*  CRP 2.0*    Lab Results  Component Value Date   SARSCOV2NAA NEGATIVE 10/22/2018   SARSCOV2NAA NEGATIVE 10/18/2018     Code Status : Full  Family Communication  : daughter [name]  updated by Phone daily, left voicemail 9/28  Disposition Plan  :  Status is: Inpatient  Remains inpatient appropriate because:Hemodynamically unstable, Unsafe d/c plan and IV treatments appropriate due to intensity of illness or inability to take PO   Dispo: The patient is from: Home              Anticipated d/c is to: Home              Anticipated d/c date is: > 3 days              Patient currently is not medically stable to d/c.     Consults  :  cardiology  Procedures  : none  DVT Prophylaxis  :  On lovenox>> will change to once daily if D-dimer less than 1 tomorrow.  Lab Results  Component Value Date   PLT 239 02/22/2020    Antibiotics  :   Anti-infectives (From admission, onward)   Start     Dose/Rate Route Frequency Ordered Stop   02/16/20 1330  azithromycin (ZITHROMAX) 500 mg in sodium chloride 0.9 % 250 mL IVPB        500 mg 250 mL/hr over 60 Minutes Intravenous Every 24 hours 02/16/20 1237 02/20/20 1320   02/16/20  1300  cefTRIAXone (ROCEPHIN) 2 g in sodium chloride 0.9 % 100 mL IVPB        2 g 200 mL/hr over 30 Minutes Intravenous Every 24 hours 02/16/20 1237 02/20/20 1252   02/16/20  1000  remdesivir 100 mg in sodium chloride 0.9 % 100 mL IVPB       "Followed by" Linked Group Details   100 mg 200 mL/hr over 30 Minutes Intravenous Daily 02/15/20 1301 02/19/20 0855   02/15/20 1400  remdesivir 200 mg in sodium chloride 0.9% 250 mL IVPB       "Followed by" Linked Group Details   200 mg 580 mL/hr over 30 Minutes Intravenous Once 02/15/20 1301 02/15/20 1504        Objective:   Vitals:   02/22/20 0130 02/22/20 0556 02/22/20 0734 02/22/20 0932  BP: 132/63 (!) 117/57 114/63   Pulse: 62 67 70 94  Resp: 15 14 12  (!) 23  Temp:  97.9 F (36.6 C) (!) 97.4 F (36.3 C)   TempSrc:  Oral Oral   SpO2: 96% 95% 93% (!) 88%  Weight:      Height:        Wt Readings from Last 3 Encounters:  02/15/20 83 kg  02/09/20 83.9 kg  08/30/19 83 kg     Intake/Output Summary (Last 24 hours) at 02/22/2020 1137 Last data filed at 02/22/2020 0907 Gross per 24 hour  Intake 120 ml  Output 750 ml  Net -630 ml     Physical Exam  Awake Alert, Oriented X 3, No new F.N deficits, Normal affect Symmetrical Chest wall movement, Good air movement bilaterally, CTAB RRR,No Gallops,Rubs or new Murmurs, No Parasternal Heave +ve B.Sounds, Abd Soft, No tenderness, No rebound - guarding or rigidity. No Cyanosis, Clubbing , has chronic left lower extremity edema.        Data Review:    CBC Recent Labs  Lab 02/16/20 0527 02/16/20 0527 02/17/20 1136 02/17/20 1136 02/18/20 0358 02/19/20 0226 02/20/20 0338 02/21/20 0458 02/22/20 0516  WBC 10.5   < > 12.4*   < > 11.2* 8.1 8.9 11.4* 13.1*  HGB 14.0   < > 15.4   < > 13.6 13.2 13.1 13.0 13.1  HCT 41.7   < > 45.7   < > 39.9 39.5 38.7* 38.8* 39.8  PLT 245   < > 290   < > 275 250 252 237 239  MCV 92.1   < > 92.7   < > 92.6 92.7 93.3 93.3 93.6  MCH 30.9   < > 31.2   < > 31.6 31.0 31.6 31.3 30.8  MCHC 33.6   < > 33.7   < > 34.1 33.4 33.9 33.5 32.9  RDW 13.3   < > 13.5   < > 13.4 13.3 13.4 13.2 13.2  LYMPHSABS 0.9  --  0.8  --  0.6* 0.3*  0.2*  --   --   MONOABS 0.4  --  0.4  --  0.3 0.2 0.2  --   --   EOSABS 0.0  --  0.0  --  0.0 0.0 0.0  --   --   BASOSABS 0.0  --  0.0  --  0.0 0.0 0.0  --   --    < > = values in this interval not displayed.    Chemistries  Recent Labs  Lab 02/16/20 0527 02/16/20 0527 02/17/20 1136 02/18/20 0358 02/19/20 0226 02/20/20 0338 02/22/20 0516  NA 140   < >  141 141 139 139 140  K 4.6   < > 4.1 4.4 4.5 4.5 4.7  CL 107   < > 110 108 108 107 106  CO2 22   < > 18* 22 23 23 28   GLUCOSE 162*   < > 252* 214* 191* 211* 143*  BUN 26*   < > 33* 34* 36* 35* 36*  CREATININE 0.96   < > 1.07 0.96 0.95 0.92 0.92  CALCIUM 8.0*   < > 8.2* 8.0* 7.7* 7.8* 7.9*  MG 2.3  --  2.5* 2.7* 2.5* 2.7*  --   AST 82*   < > 63* 82* 73* 58* 30  ALT 134*   < > 114* 119* 117* 109* 74*  ALKPHOS 110   < > 107 96 101 93 83  BILITOT 1.1   < > 0.8 0.6 0.5 0.8 1.0   < > = values in this interval not displayed.   ------------------------------------------------------------------------------------------------------------------ No results for input(s): CHOL, HDL, LDLCALC, TRIG, CHOLHDL, LDLDIRECT in the last 72 hours.  Lab Results  Component Value Date   HGBA1C 7.1 (H) 02/15/2020   ------------------------------------------------------------------------------------------------------------------ No results for input(s): TSH, T4TOTAL, T3FREE, THYROIDAB in the last 72 hours.  Invalid input(s): FREET3 ------------------------------------------------------------------------------------------------------------------ Recent Labs    02/20/20 0338  FERRITIN 3,195*    Coagulation profile No results for input(s): INR, PROTIME in the last 168 hours.  Recent Labs    02/20/20 0338  DDIMER 1.60*    Cardiac Enzymes No results for input(s): CKMB, TROPONINI, MYOGLOBIN in the last 168 hours.  Invalid input(s):  CK ------------------------------------------------------------------------------------------------------------------    Component Value Date/Time   BNP 199.1 (H) 02/15/2020 1040    Inpatient Medications  Scheduled Meds: . allopurinol  300 mg Oral Daily  . vitamin C  500 mg Oral Daily  . aspirin EC  81 mg Oral Daily  . baricitinib  4 mg Oral Daily  . enoxaparin (LOVENOX) injection  40 mg Subcutaneous BID  . furosemide  20 mg Oral Daily  . insulin aspart  0-15 Units Subcutaneous TID WC  . insulin aspart  0-5 Units Subcutaneous QHS  . insulin detemir  10 Units Subcutaneous Daily  . linagliptin  5 mg Oral Daily  . methylPREDNISolone (SOLU-MEDROL) injection  80 mg Intravenous Q12H  . metoprolol succinate  12.5 mg Oral QHS  . pantoprazole  40 mg Oral Daily  . rosuvastatin  10 mg Oral Daily  . tamsulosin  0.4 mg Oral QHS  . zinc sulfate  220 mg Oral Daily   Continuous Infusions: . sodium chloride 10 mL/hr at 02/17/20 0500   PRN Meds:.acetaminophen, chlorpheniramine-HYDROcodone, guaiFENesin-dextromethorphan, ondansetron **OR** ondansetron (ZOFRAN) IV  Micro Results Recent Results (from the past 240 hour(s))  Culture, blood (Routine X 2) w Reflex to ID Panel     Status: None   Collection Time: 02/15/20  1:24 PM   Specimen: BLOOD  Result Value Ref Range Status   Specimen Description BLOOD RIGHT ANTECUBITAL  Final   Special Requests   Final    BOTTLES DRAWN AEROBIC AND ANAEROBIC Blood Culture adequate volume   Culture   Final    NO GROWTH 5 DAYS Performed at [facility], [address] [place], [place] [address]    Report Status 02/20/2020 FINAL  Final  Culture, blood (Routine X 2) w Reflex to ID Panel     Status: None   Collection Time: 02/15/20  1:24 PM   Specimen: BLOOD  Result Value Ref Range Status  Specimen Description BLOOD BLOOD LEFT HAND  Final   Special Requests   Final    BOTTLES DRAWN AEROBIC AND ANAEROBIC Blood Culture adequate volume   Culture   Final     NO GROWTH 5 DAYS Performed at [facility], [address], [place], [place] [address]    Report Status 02/20/2020 FINAL  Final    Radiology Reports CT Head Wo Contrast  Result Date: 02/09/2020 CLINICAL DATA:  Trauma, fall on blood thinners EXAM: CT HEAD WITHOUT CONTRAST CT CERVICAL SPINE WITHOUT CONTRAST TECHNIQUE: Multidetector CT imaging of the head and cervical spine was performed following the standard protocol without intravenous contrast. Multiplanar CT image reconstructions of the cervical spine were also generated. COMPARISON:  None. FINDINGS: CT HEAD FINDINGS Brain: No acute territorial infarction, hemorrhage, or intracranial mass. Moderate atrophy. Mild hypodensity in the white matter consistent with chronic small vessel ischemic change. The ventricles are nonenlarged. Vascular: No hyperdense vessels.  Carotid vascular calcification. Skull: No depressed skull fracture. Sinuses/Orbits: Mucosal thickening in the maxillary and ethmoid sinuses. Small fluid level in the left sphenoid sinus but no central skull base lucency is seen. Possible chronic left nasal bone fracture. Other: Mild right posterior scalp swelling. CT CERVICAL SPINE FINDINGS Alignment: No subluxation.  Facet alignment within normal limits. Skull base and vertebrae: No acute fracture. No primary bone lesion or focal pathologic process. Soft tissues and spinal canal: No prevertebral fluid or swelling. No visible canal hematoma. Disc levels: Multiple level degenerative change with moderate severe disease at C5-C6 and C6-C7. Facet degenerative changes at multiple levels with foraminal narrowing. Upper chest: Apical scarring Other: None IMPRESSION: 1. No CT evidence for acute intracranial abnormality. Atrophy and mild chronic small vessel ischemic changes of the white matter. 2. Degenerative changes of the cervical spine. No acute osseous abnormality. Electronically Signed   By: Jasmine Pang M.D.   On: 02/09/2020 01:13   CT  ANGIO CHEST PE W OR WO CONTRAST  Result Date: 02/16/2020 CLINICAL DATA:  COVID positive peer EXAM: CT ANGIOGRAPHY CHEST WITH CONTRAST TECHNIQUE: Multidetector CT imaging of the chest was performed using the standard protocol during bolus administration of intravenous contrast. Multiplanar CT image reconstructions and MIPs were obtained to evaluate the vascular anatomy. CONTRAST:  OMNIPAQUE IOHEXOL 350 MG/ML SOLN COMPARISON:  Oct 22, 2018 FINDINGS: Cardiovascular: There is moderate severity calcification of the thoracic aorta. Satisfactory opacification of the pulmonary arteries to the segmental level. No evidence of pulmonary embolism. Normal heart size. No pericardial effusion. Mediastinum/Nodes: No enlarged mediastinal, hilar, or axillary lymph nodes. Thyroid gland, trachea, and esophagus demonstrate no significant findings. Lungs/Pleura: Marked severity patchy and ground-glass appearing infiltrates are seen throughout both lungs. There is no evidence of a pleural effusion or pneumothorax. Upper Abdomen: Numerous subcentimeter gallstones are seen within the lumen of an otherwise normal-appearing gallbladder. Musculoskeletal: No chest wall abnormality. No acute or significant osseous findings. Review of the MIP images confirms the above findings. IMPRESSION: 1. Marked severity bilateral patchy and ground-glass appearing infiltrates. 2. Cholelithiasis. 3. Aortic atherosclerosis. Aortic Atherosclerosis (ICD10-I70.0). Electronically Signed   By: Aram Candela M.D.   On: 02/16/2020 22:13   CT Cervical Spine Wo Contrast  Result Date: 02/09/2020 CLINICAL DATA:  Trauma, fall on blood thinners EXAM: CT HEAD WITHOUT CONTRAST CT CERVICAL SPINE WITHOUT CONTRAST TECHNIQUE: Multidetector CT imaging of the head and cervical spine was performed following the standard protocol without intravenous contrast. Multiplanar CT image reconstructions of the cervical spine were also generated. COMPARISON:  None.  FINDINGS:  CT HEAD FINDINGS Brain: No acute territorial infarction, hemorrhage, or intracranial mass. Moderate atrophy. Mild hypodensity in the white matter consistent with chronic small vessel ischemic change. The ventricles are nonenlarged. Vascular: No hyperdense vessels.  Carotid vascular calcification. Skull: No depressed skull fracture. Sinuses/Orbits: Mucosal thickening in the maxillary and ethmoid sinuses. Small fluid level in the left sphenoid sinus but no central skull base lucency is seen. Possible chronic left nasal bone fracture. Other: Mild right posterior scalp swelling. CT CERVICAL SPINE FINDINGS Alignment: No subluxation.  Facet alignment within normal limits. Skull base and vertebrae: No acute fracture. No primary bone lesion or focal pathologic process. Soft tissues and spinal canal: No prevertebral fluid or swelling. No visible canal hematoma. Disc levels: Multiple level degenerative change with moderate severe disease at C5-C6 and C6-C7. Facet degenerative changes at multiple levels with foraminal narrowing. Upper chest: Apical scarring Other: None IMPRESSION: 1. No CT evidence for acute intracranial abnormality. Atrophy and mild chronic small vessel ischemic changes of the white matter. 2. Degenerative changes of the cervical spine. No acute osseous abnormality. Electronically Signed   By: Jasmine Pang M.D.   On: 02/09/2020 01:13   DG Chest Port 1 View  Result Date: 02/15/2020 CLINICAL DATA:  COVID positive.  Hypoxia. EXAM: PORTABLE CHEST 1 VIEW COMPARISON:  Oct 22, 2018 FINDINGS: Diffuse bilateral interstitial and airspace opacities, new from prior. No visible pleural effusion. No pneumothorax. Cardiac silhouette is accentuated by low lung volumes and portable technique. Aortic atherosclerosis. IMPRESSION: Diffuse bilateral interstitial and airspace opacities, likely multifocal pneumonia given the clinical history. Electronically Signed   By: Feliberto Harts MD   On: 02/15/2020 10:56   VAS [place]  LOWER EXTREMITY VENOUS (DVT)  Result Date: 02/16/2020  Lower Venous DVTStudy Indications: Swelling, and elevated d dimer.  Risk Factors: + covid. Performing Technologist: Jannet Askew RCT RDMS  Examination Guidelines: A complete evaluation includes B-mode imaging, spectral Doppler, color Doppler, and power Doppler as needed of all accessible portions of each vessel. Bilateral testing is considered an integral part of a complete examination. Limited examinations for reoccurring indications may be performed as noted. The reflux portion of the exam is performed with the patient in reverse Trendelenburg.  +---------+---------------+---------+-----------+----------+--------------+ RIGHT    CompressibilityPhasicitySpontaneityPropertiesThrombus Aging +---------+---------------+---------+-----------+----------+--------------+ CFV      Full           Yes      Yes                                 +---------+---------------+---------+-----------+----------+--------------+ SFJ      Full                                                        +---------+---------------+---------+-----------+----------+--------------+ FV Prox  Full                                                        +---------+---------------+---------+-----------+----------+--------------+ FV Mid   Full                                                        +---------+---------------+---------+-----------+----------+--------------+  FV DistalFull                                                        +---------+---------------+---------+-----------+----------+--------------+ PFV      Full                                                        +---------+---------------+---------+-----------+----------+--------------+ POP      Full           Yes      Yes                                 +---------+---------------+---------+-----------+----------+--------------+ PTV      Full                                                         +---------+---------------+---------+-----------+----------+--------------+ PERO     Full                                                        +---------+---------------+---------+-----------+----------+--------------+   +---------+---------------+---------+-----------+----------+--------------+ LEFT     CompressibilityPhasicitySpontaneityPropertiesThrombus Aging +---------+---------------+---------+-----------+----------+--------------+ CFV      Full           Yes      Yes                                 +---------+---------------+---------+-----------+----------+--------------+ SFJ      Full                                                        +---------+---------------+---------+-----------+----------+--------------+ FV Prox  Full                                                        +---------+---------------+---------+-----------+----------+--------------+ FV Mid   Full                                                        +---------+---------------+---------+-----------+----------+--------------+ FV DistalFull                                                        +---------+---------------+---------+-----------+----------+--------------+  PFV      Full                                                        +---------+---------------+---------+-----------+----------+--------------+ POP      Full           Yes      Yes                                 +---------+---------------+---------+-----------+----------+--------------+ PTV      Full                                                        +---------+---------------+---------+-----------+----------+--------------+ PERO     Full                                                        +---------+---------------+---------+-----------+----------+--------------+     Summary: RIGHT: - There is no evidence of deep vein thrombosis in the lower extremity.   - No cystic structure found in the popliteal fossa.  LEFT: - There is no evidence of deep vein thrombosis in the lower extremity.  - No cystic structure found in the popliteal fossa.  *See table(s) above for measurements and observations. Electronically signed by Coral Else MD on 02/16/2020 at 9:33:24 PM.    Final    ECHOCARDIOGRAM LIMITED  Result Date: 02/16/2020    ECHOCARDIOGRAM LIMITED REPORT   Patient Name:   KALLIN HENK Date of Exam: 02/16/2020 Medical Rec #:  [id number]       Height:       72.0 in Accession #:    [id number]      Weight:       183.0 lb Date of Birth:  [birth year]       BSA:          2.052 m Patient Age:    84 years        BP:           114/53 mmHg Patient Gender: M               HR:           77 bpm. Exam Location:  Inpatient Procedure: Limited Echo, Cardiac Doppler and Color Doppler Indications:    Elevated Troponin  History:        Patient has prior history of Echocardiogram examinations, most                 recent 02/17/2019. CHF and Cardiomyopathy, CAD, Arrythmias:Atrial                 Fibrillation, Signs/Symptoms:Shortness of Breath and Fever; Risk                 Factors:Hypertension, Dyslipidemia, OBESITY and Diabetes.                 COVID+.  Sonographer:    Lavenia Atlas Referring Phys: [phone]  RINKA R PAHWANI IMPRESSIONS  1. Left ventricular ejection fraction, by estimation, is 55 to 60%.  2. Right ventricular systolic function is normal. The right ventricular size is normal. There is normal pulmonary artery systolic pressure.  3. The interatrial septum is aneuyrsmal with frequent bowing from right to left.  4. The mitral valve is normal in structure. Trivial mitral valve regurgitation.  5. There is mild calcification of the aortic valve. There is mild thickening of the aortic valve. Mild aortic valve sclerosis is present, with no evidence of aortic valve stenosis. Comparison(s): Compared to prior echo on 01/2019, the LVEF appears mildly improved to 55-60%. FINDINGS  Left  Ventricle: Septal motion is abnormal consistent with prior surgery. Left ventricular ejection fraction, by estimation, is 55 to 60%. The left ventricular internal cavity size was normal in size. There is no left ventricular hypertrophy. Right Ventricle: The right ventricular size is normal. Right ventricular systolic function is normal. There is normal pulmonary artery systolic pressure. The tricuspid regurgitant velocity is 2.41 m/s, and with an assumed right atrial pressure of 3 mmHg,  the estimated right ventricular systolic pressure is 26.2 mmHg. Left Atrium: Left atrial size was normal in size. Pericardium: There is no evidence of pericardial effusion. Mitral Valve: The mitral valve is normal in structure. There is mild thickening of the mitral valve leaflet(s). Mild mitral annular calcification. Trivial mitral valve regurgitation. Tricuspid Valve: The tricuspid valve is normal in structure. Tricuspid valve regurgitation is mild. Aortic Valve: There is mild calcification of the aortic valve. There is mild thickening of the aortic valve. Mild aortic valve sclerosis is present, with no evidence of aortic valve stenosis. Pulmonic Valve: The pulmonic valve was not assessed. Aorta: The aortic root is normal in size and structure. LEFT VENTRICLE PLAX 2D LVIDd:         3.80 cm  Diastology LVIDs:         2.70 cm  LV e' medial:    7.51 cm/s LV PW:         1.20 cm  LV E/e' medial:  7.7 LV IVS:        0.90 cm  LV e' lateral:   5.98 cm/s LVOT diam:     1.90 cm  LV E/e' lateral: 9.7 LVOT Area:     2.84 cm  RIGHT VENTRICLE RV S prime:     13.30 cm/s LEFT ATRIUM         Index LA diam:    2.80 cm 1.36 cm/m   AORTA Ao Root diam: 2.50 cm MITRAL VALVE               TRICUSPID VALVE MV Area (PHT): 5.38 cm    TR Peak grad:   23.2 mmHg MV Decel Time: 141 msec    TR Vmax:        241.00 cm/s MV E velocity: 57.80 cm/s MV A velocity: 70.30 cm/s  SHUNTS MV E/A ratio:  0.82        Systemic Diam: 1.90 cm Laurance Flatten MD  Electronically signed by Laurance Flatten MD Signature Date/Time: 02/16/2020/4:23:30 PM    Final    ABORTED INVASIVE LAB PROCEDURE  Result Date: 02/15/2020 This case was aborted.  US Abdomen Limited RUQ  Result Date: 02/15/2020 CLINICAL DATA:  Elevated liver enzymes. EXAM: ULTRASOUND ABDOMEN LIMITED RIGHT UPPER QUADRANT COMPARISON:  CT abdomen 07/06/2019. FINDINGS: Gallbladder: Gallstones measuring up to 7 mm noted. Gallbladder wall thickness normal. No Murphy sign. Common bile duct: Diameter: Not visualized due  to overlying bowel gas. Liver: Left no not visualized due to overlying bowel gas. No focal lesion identified. Within normal limits in parenchymal echogenicity. Portal vein is patent on color Doppler imaging with normal direction of blood flow towards the liver. Other: Limited study due to overlying bowel gas. IMPRESSION: 1.  Multiple small gallstones.  No gallbladder wall thickening. 2. Limited exam due to overlying bowel gas. Common bile duct not visualized. Portions of the liver not visualized. Electronically Signed   By: Maisie Fus  Register   On: 02/15/2020 15:35     Huey Bienenstock M.D on 02/22/2020 at 11:37 AM    [facility] -  Office  [phone]

## 2020-02-23 DIAGNOSIS — R748 Abnormal levels of other serum enzymes: Secondary | ICD-10-CM | POA: Diagnosis not present

## 2020-02-23 DIAGNOSIS — I259 Chronic ischemic heart disease, unspecified: Secondary | ICD-10-CM | POA: Diagnosis not present

## 2020-02-23 DIAGNOSIS — E119 Type 2 diabetes mellitus without complications: Secondary | ICD-10-CM | POA: Diagnosis not present

## 2020-02-23 DIAGNOSIS — U071 COVID-19: Secondary | ICD-10-CM | POA: Diagnosis not present

## 2020-02-23 LAB — COMPREHENSIVE METABOLIC PANEL
ALT: 83 U/L — ABNORMAL HIGH (ref 0–44)
AST: 54 U/L — ABNORMAL HIGH (ref 15–41)
Albumin: 2.2 g/dL — ABNORMAL LOW (ref 3.5–5.0)
Alkaline Phosphatase: 95 U/L (ref 38–126)
Anion gap: 10 (ref 5–15)
BUN: 36 mg/dL — ABNORMAL HIGH (ref 8–23)
CO2: 23 mmol/L (ref 22–32)
Calcium: 8.1 mg/dL — ABNORMAL LOW (ref 8.9–10.3)
Chloride: 104 mmol/L (ref 98–111)
Creatinine, Ser: 0.95 mg/dL (ref 0.61–1.24)
GFR calc Af Amer: >60 mL/min (ref >60)
GFR calc non Af Amer: >60 mL/min (ref >60)
Glucose, Bld: 124 mg/dL — ABNORMAL HIGH (ref 70–99)
Potassium: 4.3 mmol/L (ref 3.5–5.1)
Sodium: 137 mmol/L (ref 135–145)
Total Bilirubin: 1.2 mg/dL (ref 0.3–1.2)
Total Protein: 5.5 g/dL — ABNORMAL LOW (ref 6.5–8.1)

## 2020-02-23 LAB — GLUCOSE, CAPILLARY
Glucose-Capillary: 53 mg/dL — ABNORMAL LOW (ref 70–99)
Glucose-Capillary: 92 mg/dL (ref 70–99)
Glucose-Capillary: 209 mg/dL — ABNORMAL HIGH (ref 70–99)

## 2020-02-23 LAB — CBC
HCT: 46.6 % (ref 39.0–52.0)
Hemoglobin: 15.3 g/dL (ref 13.0–17.0)
MCH: 30.7 pg (ref 26.0–34.0)
MCHC: 32.8 g/dL (ref 30.0–36.0)
MCV: 93.6 fL (ref 80.0–100.0)
Platelets: 317 10*3/uL (ref 150–400)
RBC: 4.98 MIL/uL (ref 4.22–5.81)
RDW: 13.2 % (ref 11.5–15.5)
WBC: 19.8 10*3/uL — ABNORMAL HIGH (ref 4.0–10.5)
nRBC: 0 % (ref 0.0–0.2)

## 2020-02-23 LAB — D-DIMER, QUANTITATIVE: D-Dimer, Quant: 2.82 ug/mL-FEU — ABNORMAL HIGH (ref 0.00–0.50)

## 2020-02-23 LAB — C-REACTIVE PROTEIN: CRP: 0.6 mg/dL (ref <1.0)

## 2020-02-23 MED ORDER — POLYETHYLENE GLYCOL 3350 17 G PO PACK
17.0000 g | PACK | Freq: Every day | ORAL | Status: DC
  Administered 2020-02-23: 17 g via ORAL
  Filled 2020-02-23: qty 1

## 2020-02-23 MED ORDER — PREDNISOLONE ACETATE 1 % OP SUSP
1.0000 [drp] | Freq: Every day | OPHTHALMIC | Status: DC
  Administered 2020-02-23 – 2020-03-07 (×14): 1 [drp] via OPHTHALMIC
  Filled 2020-02-23 (×3): qty 5

## 2020-02-23 NOTE — Evaluation (Addendum)
Occupational Therapy Evaluation Patient Details Name: Gordon Novak MRN: [id number] DOB: November 16, 1934 Today's Date: 02/23/2020    History of Present Illness Pt is an 85 y.o. male who tested (+) COVID-19 on 02/07/20, now admitted 02/15/20 with hypoxia; workup for acute hypoxemic respiratory failure due to COVID-19 PNA with severe lung parenchymal injury. PMH includes CAD, HTN, HLD, DM2, PE; pt fully vaccinated.   Clinical Impression   PTA pt living with spouse and functioning at independent community level. At time of eval, pt presents with ability to complete sit <> stands at min-mod A level to maintain balance. Pt is currently completing BADL at min A level due to poor activity tolerance. Pt was limited in full mobility this date due to [facility]. Pt able to march in place ~2 mins with O2 stas reading 78%. With seated rest, pt able to recover to 89-90% with ~3 mins and cues for pursed lip breathing. Given current status, recommend HHOT to support safety, BADL engagement, and independent PLOF. OT will continue to follow per POC listed below.  Vitals: Pt required 25L of O2 via HHFNC during session. O2 as low as 78% during marching in place in front of recliner. Pt recovered after ~62mins and cues for pursed lip breathing.     Follow Up Recommendations  Home health OT;Supervision/Assistance - 24 hour    Equipment Recommendations  3 in 1 bedside commode    Recommendations for Other Services       Precautions / Restrictions Precautions Precautions: Fall;Other (comment) Precaution Comments: Quick to desaturate - on 25L O2 HHFNC at 80% FiO2; chronic LLE swelling (negative DVT) Restrictions Weight Bearing Restrictions: No      Mobility Bed Mobility               General bed mobility comments: up in chair, returned to chair  Transfers Overall transfer level: Needs assistance Equipment used: 1 person hand held assist Transfers: Sit to/from Stand Sit to Stand: Min assist;Mod assist          General transfer comment: min-mod A to maintain balance in standing    Balance Overall balance assessment: Needs assistance   Sitting balance-Leahy Scale: Fair     Standing balance support: During functional activity Standing balance-Leahy Scale: Poor Standing balance comment: required min- mod A to catch balance with short bout of walking                           ADL either performed or assessed with clinical judgement   ADL Overall ADL's : Needs assistance/impaired Eating/Feeding: Set up;Sitting   Grooming: Set up;Sitting   Upper Body Bathing: Set up;Sitting   Lower Body Bathing: Minimal assistance;Sit to/from stand;Sitting/lateral leans   Upper Body Dressing : Set up;Sitting   Lower Body Dressing: Minimal assistance;Sit to/from stand;Sitting/lateral leans   Toilet Transfer: Minimal assistance;Moderate assistance;Stand-pivot;BSC Toilet Transfer Details (indicate cue type and reason): requires min-mod A to maintain standing balance Toileting- Clothing Manipulation and Hygiene: Minimal assistance;Sitting/lateral lean       Functional mobility during ADLs: Minimal assistance General ADL Comments: pt limited to short bouts of activity 2/2 HHFNC. Is limited in activity tolerance 2/2 cardiopulmonary status and high O2 demand     Vision Baseline Vision/History: Wears glasses Wears Glasses: At all times Patient Visual Report: No change from baseline       Perception     Praxis      Pertinent Vitals/Pain Pain Assessment: No/denies pain  Hand Dominance     Extremity/Trunk Assessment Upper Extremity Assessment Upper Extremity Assessment: Generalized weakness   Lower Extremity Assessment Lower Extremity Assessment: Generalized weakness       Communication Communication Communication: No difficulties   Cognition Arousal/Alertness: Awake/alert Behavior During Therapy: WFL for tasks assessed/performed Overall Cognitive Status: Within  Functional Limits for tasks assessed                                     General Comments  Pt on 25L O2 HHFNC at 80% FiO2 -- SpO2 down to 64% with standing (good pleth via finger probe - pt SOB, but not significantly symptomatic) - switched to ear probe with SpO2 down to 77% standing activity, returning to 88-91% with prolonged seated rest break, HR 93    Exercises Other Exercises Other Exercises: incentive spirometer, cues for technique (pt pulling ~752mL after bouts of standing exercise) Other Exercises: Steps to recliner, seated rest, then 2x bouts marching in place for ~10-sec before needing sitting rest between bouts   Shoulder Instructions      Home Living Family/patient expects to be discharged to:: Private residence Living Arrangements: Spouse/significant other Available Help at Discharge: Family;Available 24 hours/day Type of Home: House Home Access: Stairs to enter [employer] of Steps: 3 Entrance Stairs-Rails: Right;Left Home Layout: One level     Bathroom Shower/Tub: Estate manager/land agent Accessibility: Yes   Home Equipment: Environmental consultant - 2 wheels   Additional Comments: Reports wife in good health and independent, did not get sick      Prior Functioning/Environment Level of Independence: Independent        Comments: Active, drives, enjoys going to church and traveling with wife        OT Problem List: Decreased strength;Decreased knowledge of use of DME or AE;Decreased knowledge of precautions;Decreased activity tolerance;Cardiopulmonary status limiting activity;Impaired balance (sitting and/or standing)      OT Treatment/Interventions: Self-care/ADL training;Therapeutic exercise;Patient/family education;Balance training;Energy conservation;Therapeutic activities;DME and/or AE instruction    OT Goals(Current goals can be found in the care plan section) Acute Rehab OT Goals Patient Stated Goal: "I want to get back home as soon as  I can... I hope our traveling days have not come to an end" OT Goal Formulation: With patient Time For Goal Achievement: 03/08/20 Potential to Achieve Goals: Good  OT Frequency: Min 2X/week   Barriers to D/C:            Co-evaluation              AM-PAC OT "6 Clicks" Daily Activity     Outcome Measure Help from another person eating meals?: A Little Help from another person taking care of personal grooming?: A Little Help from another person toileting, which includes using toliet, bedpan, or urinal?: A Little Help from another person bathing (including washing, rinsing, drying)?: A Little Help from another person to put on and taking off regular upper body clothing?: A Little Help from another person to put on and taking off regular lower body clothing?: A Little 6 Click Score: 18   End of Session Equipment Utilized During Treatment: Gait belt;Rolling walker;Oxygen Nurse Communication: Mobility status  Activity Tolerance: Patient tolerated treatment well Patient left: in chair;with call bell/phone within reach  OT Visit Diagnosis: Unsteadiness on feet (R26.81);Other abnormalities of gait and mobility (R26.89);Muscle weakness (generalized) (M62.81)  Time: 3790-2409 OT Time Calculation (min): 16 min Charges:  OT General Charges $OT Visit: 1 Visit OT Evaluation $OT Eval Moderate Complexity: 1 Mod  Dalphine Handing, MSOT, OTR/L Acute Rehabilitation Services [facility] Office Number: [phone] Pager: [phone]  Dalphine Handing 02/23/2020, 1:28 PM

## 2020-02-23 NOTE — Progress Notes (Signed)
PROGRESS NOTE                                                                                                                                                                                                             Patient Demographics:    Gordon Novak, is a 85 y.o. male, DOB - 04-Oct-1934, ZOX:[id number]  Outpatient Primary MD for the patient is [name], MD   Admit date - 02/15/2020   LOS - 8  No chief complaint on file.      Brief Narrative: Patient is a 85 y.o. male with PMHx of CAD s/p PCI 2020, DM-2, HLD, HTN, history of PE no longer on anticoagulation-presented with shortness of breath-found to have severe hypoxemia requiring HFNC-due to COVID-19 pneumonia.  COVID-19 vaccinated status: Vaccinated  Significant Events: 9/21>> Admit to [facility] for hypoxia due to COVID-19 pneumonia  Significant studies: 9/21>>Chest x-ray: Diffuse lung infiltrates 9/22>> CTA chest: No PE-severe bilateral patchy/groundglass appearing infiltrates, cholelithiasis 9/22>> bilateral lower extremity Doppler: No DVT   COVID-19 medications: Steroids: 9/21>> Remdesivir: 9/21>> 9/25 Baricitinib: 9/21>>  Antibiotics: Rocephin: 9/22>> 9/26 Zithromax: 9/22>> 9/26  Microbiology data: 9/21 >>blood culture: No growth  Procedures: None  Consults: Cardiology  DVT prophylaxis: enoxaparin (LOVENOX) injection 40 mg Start: 02/17/20 2200 SCDs Start: 02/15/20 1300    Subjective:    [name] today remains comfortable at rest-acknowledges shortness of breath with minimal ambulation.   Assessment  & Plan :   Acute Hypoxic Resp Failure due to Covid 19 Viral pneumonia: Continues to have severe hypoxemia-remains on IV steroid and baricitinib-has finished a course of remdesivir.  No signs of volume overload-has chronic left lower extremity edema.  Fever: afebrile O2 requirements:  SpO2: 95 % O2 Flow Rate (L/min): 25 L/min FiO2  (%): 80 %   COVID-19 Labs: No results for input(s): DDIMER, FERRITIN, LDH, CRP in the last 72 hours.     Component Value Date/Time   BNP 199.1 (H) 02/15/2020 1040    Recent Labs  Lab 02/18/20 0358 02/19/20 0226  PROCALCITON 0.47 0.26    Lab Results  Component Value Date   SARSCOV2NAA NEGATIVE 10/22/2018   SARSCOV2NAA NEGATIVE 10/18/2018    Prone/Incentive Spirometry: encouraged patient to lie prone for 3-4 hours at a time for a total of 16 hours a day, and to encourage incentive spirometry use 3-4/hour.  Elevated troponins: Trend is flat-not consistent with ACS-likely demand ischemia-cardiology evaluation completed  Minimally elevated D-dimer: Downtrending-has history of PE in 2020-(completed 1 year of treatment with Xarelto in May)-CTA chest/Dopplers negative-on intermediate dosing of Lovenox.  Transaminitis: Likely secondary to COVID-19-trending down-follow.  Chronic systolic heart failure: Euvolemic on exam.  CAD: No chest pain this morning-evaluated by cardiology on initial presentation due to concern for EKG changes-recommendations are to continue aspirin/statin/beta-blocker.  Gout: Continue allopurinol  BPH: Continue Flomax  Chronic left lower extremity swelling: At baseline-lower extremity Dopplers negative.  ABG: No results found for: PHART, PCO2ART, PO2ART, HCO3, TCO2, ACIDBASEDEF, O2SAT  Vent Settings: N/A FiO2 (%):  [80 %-90 %] 80 %  Condition - Extremely Guarded  Family Communication  :  Daughter [name]([name] [phone])  updated over the phone 9/29-  Code Status :  Full Code-discussed at length regarding CODE STATUS-patient seems not keen on going on a ventilator if he were to [employer]-however he will discuss with family-I spoke with patient's daughter Zella BallRobin over the phone regarding CODE STATUS-family will discuss-I will touch base with family tomorrow  Diet :  Diet Order            Diet heart healthy/carb modified Room service appropriate? No; Fluid  consistency: Thin  Diet effective now                  Disposition Plan  :   Status is: Inpatient  Remains inpatient appropriate because:Inpatient level of care appropriate due to severity of illness   Dispo: The patient is from: Home              Anticipated d/c is to: Home              Anticipated d/c date is: > 3 days              Patient currently is not medically stable to d/c.  Barriers to discharge: Hypoxia requiring O2 supplementation/complete 5 days of IV Remdesivir  Antimicorbials  :    Anti-infectives (From admission, onward)   Start     Dose/Rate Route Frequency Ordered Stop   02/16/20 1330  azithromycin (ZITHROMAX) 500 mg in sodium chloride 0.9 % 250 mL IVPB        500 mg 250 mL/hr over 60 Minutes Intravenous Every 24 hours 02/16/20 1237 02/20/20 1320   02/16/20 1300  cefTRIAXone (ROCEPHIN) 2 g in sodium chloride 0.9 % 100 mL IVPB        2 g 200 mL/hr over 30 Minutes Intravenous Every 24 hours 02/16/20 1237 02/20/20 1252   02/16/20 1000  remdesivir 100 mg in sodium chloride 0.9 % 100 mL IVPB       "Followed by" Linked Group Details   100 mg 200 mL/hr over 30 Minutes Intravenous Daily 02/15/20 1301 02/19/20 0855   02/15/20 1400  remdesivir 200 mg in sodium chloride 0.9% 250 mL IVPB       "Followed by" Linked Group Details   200 mg 580 mL/hr over 30 Minutes Intravenous Once 02/15/20 1301 02/15/20 1504      Inpatient Medications  Scheduled Meds: . allopurinol  300 mg Oral Daily  . vitamin C  500 mg Oral Daily  . aspirin EC  81 mg Oral Daily  . baricitinib  4 mg Oral Daily  . enoxaparin (LOVENOX) injection  40 mg Subcutaneous BID  . furosemide  20 mg Oral Daily  . insulin aspart  0-15 Units Subcutaneous TID WC  . insulin aspart  0-5 Units Subcutaneous QHS  . insulin detemir  15 Units Subcutaneous Daily  . linagliptin  5 mg Oral Daily  . methylPREDNISolone (SOLU-MEDROL) injection  80 mg Intravenous Q12H  . metoprolol succinate  12.5 mg Oral QHS  .  pantoprazole  40 mg Oral Daily  . polyethylene glycol  17 g Oral Daily  . prednisoLONE acetate  1 drop Both Eyes QHS  . rosuvastatin  10 mg Oral Daily  . tamsulosin  0.4 mg Oral QHS  . zinc sulfate  220 mg Oral Daily   Continuous Infusions: . sodium chloride 10 mL/hr at 02/17/20 0500   PRN Meds:.acetaminophen, chlorpheniramine-HYDROcodone, guaiFENesin-dextromethorphan, ondansetron **OR** ondansetron (ZOFRAN) IV   Time Spent in minutes  35     See all Orders from today for further details   Jeoffrey Massed M.D on 02/23/2020 at 10:29 AM  To page go to [email or web address] - use universal password  [facility] -  Office  [phone]    Objective:   Vitals:   02/23/20 0148 02/23/20 0400 02/23/20 0757 02/23/20 0800  BP:  (!) 143/85    Pulse: (!) 58 69  73  Resp: 18 18  20   Temp:  97.9 F (36.6 C)  97.8 F (36.6 C)  TempSrc:  Oral  Oral  SpO2:  97% 93% 95%  Weight:      Height:        Wt Readings from Last 3 Encounters:  02/15/20 83 kg  02/09/20 83.9 kg  08/30/19 83 kg     Intake/Output Summary (Last 24 hours) at 02/23/2020 1029 Last data filed at 02/23/2020 0937 Gross per 24 hour  Intake 240 ml  Output 1120 ml  Net -880 ml     Physical Exam Gen Exam:Alert awake-not in any distress HEENT:atraumatic, normocephalic Chest: B/L clear to auscultation anteriorly CVS:S1S2 regular Abdomen:soft non tender, non distended Extremities: Left lower extremity 2+ pitting edema Neurology: Non focal Skin: no rash   Data Review:    CBC Recent Labs  Lab 02/17/20 1136 02/17/20 1136 02/18/20 0358 02/19/20 0226 02/20/20 0338 02/21/20 0458 02/22/20 0516  WBC 12.4*   < > 11.2* 8.1 8.9 11.4* 13.1*  HGB 15.4   < > 13.6 13.2 13.1 13.0 13.1  HCT 45.7   < > 39.9 39.5 38.7* 38.8* 39.8  PLT 290   < > 275 250 252 237 239  MCV 92.7   < > 92.6 92.7 93.3 93.3 93.6  MCH 31.2   < > 31.6 31.0 31.6 31.3 30.8  MCHC 33.7   < > 34.1 33.4 33.9 33.5 32.9  RDW 13.5   < > 13.4 13.3  13.4 13.2 13.2  LYMPHSABS 0.8  --  0.6* 0.3* 0.2*  --   --   MONOABS 0.4  --  0.3 0.2 0.2  --   --   EOSABS 0.0  --  0.0 0.0 0.0  --   --   BASOSABS 0.0  --  0.0 0.0 0.0  --   --    < > = values in this interval not displayed.    Chemistries  Recent Labs  Lab 02/17/20 1136 02/18/20 0358 02/19/20 0226 02/20/20 0338 02/22/20 0516  NA 141 141 139 139 140  K 4.1 4.4 4.5 4.5 4.7  CL 110 108 108 107 106  CO2 18* 22 23 23 28   GLUCOSE 252* 214* 191* 211* 143*  BUN 33* 34* 36* 35* 36*  CREATININE 1.07 0.96 0.95 0.92 0.92  CALCIUM 8.2* 8.0* 7.7* 7.8*  7.9*  MG 2.5* 2.7* 2.5* 2.7*  --   AST 63* 82* 73* 58* 30  ALT 114* 119* 117* 109* 74*  ALKPHOS 107 96 101 93 83  BILITOT 0.8 0.6 0.5 0.8 1.0   ------------------------------------------------------------------------------------------------------------------ No results for input(s): CHOL, HDL, LDLCALC, TRIG, CHOLHDL, LDLDIRECT in the last 72 hours.  Lab Results  Component Value Date   HGBA1C 7.1 (H) 02/15/2020   ------------------------------------------------------------------------------------------------------------------ No results for input(s): TSH, T4TOTAL, T3FREE, THYROIDAB in the last 72 hours.  Invalid input(s): FREET3 ------------------------------------------------------------------------------------------------------------------ No results for input(s): VITAMINB12, FOLATE, FERRITIN, TIBC, IRON, RETICCTPCT in the last 72 hours.  Coagulation profile No results for input(s): INR, PROTIME in the last 168 hours.  No results for input(s): DDIMER in the last 72 hours.  Cardiac Enzymes No results for input(s): CKMB, TROPONINI, MYOGLOBIN in the last 168 hours.  Invalid input(s): CK ------------------------------------------------------------------------------------------------------------------    Component Value Date/Time   BNP 199.1 (H) 02/15/2020 1040    Micro Results Recent Results (from the past 240 hour(s))    Culture, blood (Routine X 2) w Reflex to ID Panel     Status: None   Collection Time: 02/15/20  1:24 PM   Specimen: BLOOD  Result Value Ref Range Status   Specimen Description BLOOD RIGHT ANTECUBITAL  Final   Special Requests   Final    BOTTLES DRAWN AEROBIC AND ANAEROBIC Blood Culture adequate volume   Culture   Final    NO GROWTH 5 DAYS Performed at [facility], [address] [place], [place] [address]    Report Status 02/20/2020 FINAL  Final  Culture, blood (Routine X 2) w Reflex to ID Panel     Status: None   Collection Time: 02/15/20  1:24 PM   Specimen: BLOOD  Result Value Ref Range Status   Specimen Description BLOOD BLOOD LEFT HAND  Final   Special Requests   Final    BOTTLES DRAWN AEROBIC AND ANAEROBIC Blood Culture adequate volume   Culture   Final    NO GROWTH 5 DAYS Performed at [facility], [address] [place], [place] [address]    Report Status 02/20/2020 FINAL  Final    Radiology Reports CT Head Wo Contrast  Result Date: 02/09/2020 CLINICAL DATA:  Trauma, fall on blood thinners EXAM: CT HEAD WITHOUT CONTRAST CT CERVICAL SPINE WITHOUT CONTRAST TECHNIQUE: Multidetector CT imaging of the head and cervical spine was performed following the standard protocol without intravenous contrast. Multiplanar CT image reconstructions of the cervical spine were also generated. COMPARISON:  None. FINDINGS: CT HEAD FINDINGS Brain: No acute territorial infarction, hemorrhage, or intracranial mass. Moderate atrophy. Mild hypodensity in the white matter consistent with chronic small vessel ischemic change. The ventricles are nonenlarged. Vascular: No hyperdense vessels.  Carotid vascular calcification. Skull: No depressed skull fracture. Sinuses/Orbits: Mucosal thickening in the maxillary and ethmoid sinuses. Small fluid level in the left sphenoid sinus but no central skull base lucency is seen. Possible chronic left nasal bone fracture. Other: Mild right posterior scalp  swelling. CT CERVICAL SPINE FINDINGS Alignment: No subluxation.  Facet alignment within normal limits. Skull base and vertebrae: No acute fracture. No primary bone lesion or focal pathologic process. Soft tissues and spinal canal: No prevertebral fluid or swelling. No visible canal hematoma. Disc levels: Multiple level degenerative change with moderate severe disease at C5-C6 and C6-C7. Facet degenerative changes at multiple levels with foraminal narrowing. Upper chest: Apical scarring Other: None IMPRESSION: 1. No CT evidence for acute intracranial  abnormality. Atrophy and mild chronic small vessel ischemic changes of the white matter. 2. Degenerative changes of the cervical spine. No acute osseous abnormality. Electronically Signed   By: Jasmine Pang M.D.   On: 02/09/2020 01:13   CT ANGIO CHEST PE W OR WO CONTRAST  Result Date: 02/16/2020 CLINICAL DATA:  COVID positive peer EXAM: CT ANGIOGRAPHY CHEST WITH CONTRAST TECHNIQUE: Multidetector CT imaging of the chest was performed using the standard protocol during bolus administration of intravenous contrast. Multiplanar CT image reconstructions and MIPs were obtained to evaluate the vascular anatomy. CONTRAST:  OMNIPAQUE IOHEXOL 350 MG/ML SOLN COMPARISON:  Oct 22, 2018 FINDINGS: Cardiovascular: There is moderate severity calcification of the thoracic aorta. Satisfactory opacification of the pulmonary arteries to the segmental level. No evidence of pulmonary embolism. Normal heart size. No pericardial effusion. Mediastinum/Nodes: No enlarged mediastinal, hilar, or axillary lymph nodes. Thyroid gland, trachea, and esophagus demonstrate no significant findings. Lungs/Pleura: Marked severity patchy and ground-glass appearing infiltrates are seen throughout both lungs. There is no evidence of a pleural effusion or pneumothorax. Upper Abdomen: Numerous subcentimeter gallstones are seen within the lumen of an otherwise normal-appearing gallbladder.  Musculoskeletal: No chest wall abnormality. No acute or significant osseous findings. Review of the MIP images confirms the above findings. IMPRESSION: 1. Marked severity bilateral patchy and ground-glass appearing infiltrates. 2. Cholelithiasis. 3. Aortic atherosclerosis. Aortic Atherosclerosis (ICD10-I70.0). Electronically Signed   By: Aram Candela M.D.   On: 02/16/2020 22:13   CT Cervical Spine Wo Contrast  Result Date: 02/09/2020 CLINICAL DATA:  Trauma, fall on blood thinners EXAM: CT HEAD WITHOUT CONTRAST CT CERVICAL SPINE WITHOUT CONTRAST TECHNIQUE: Multidetector CT imaging of the head and cervical spine was performed following the standard protocol without intravenous contrast. Multiplanar CT image reconstructions of the cervical spine were also generated. COMPARISON:  None. FINDINGS: CT HEAD FINDINGS Brain: No acute territorial infarction, hemorrhage, or intracranial mass. Moderate atrophy. Mild hypodensity in the white matter consistent with chronic small vessel ischemic change. The ventricles are nonenlarged. Vascular: No hyperdense vessels.  Carotid vascular calcification. Skull: No depressed skull fracture. Sinuses/Orbits: Mucosal thickening in the maxillary and ethmoid sinuses. Small fluid level in the left sphenoid sinus but no central skull base lucency is seen. Possible chronic left nasal bone fracture. Other: Mild right posterior scalp swelling. CT CERVICAL SPINE FINDINGS Alignment: No subluxation.  Facet alignment within normal limits. Skull base and vertebrae: No acute fracture. No primary bone lesion or focal pathologic process. Soft tissues and spinal canal: No prevertebral fluid or swelling. No visible canal hematoma. Disc levels: Multiple level degenerative change with moderate severe disease at C5-C6 and C6-C7. Facet degenerative changes at multiple levels with foraminal narrowing. Upper chest: Apical scarring Other: None IMPRESSION: 1. No CT evidence for acute intracranial  abnormality. Atrophy and mild chronic small vessel ischemic changes of the white matter. 2. Degenerative changes of the cervical spine. No acute osseous abnormality. Electronically Signed   By: Jasmine Pang M.D.   On: 02/09/2020 01:13   DG Chest Port 1 View  Result Date: 02/15/2020 CLINICAL DATA:  COVID positive.  Hypoxia. EXAM: PORTABLE CHEST 1 VIEW COMPARISON:  Oct 22, 2018 FINDINGS: Diffuse bilateral interstitial and airspace opacities, new from prior. No visible pleural effusion. No pneumothorax. Cardiac silhouette is accentuated by low lung volumes and portable technique. Aortic atherosclerosis. IMPRESSION: Diffuse bilateral interstitial and airspace opacities, likely multifocal pneumonia given the clinical history. Electronically Signed   By: Feliberto Harts MD   On: 02/15/2020 10:56   VAS  [place] LOWER EXTREMITY VENOUS (DVT)  Result Date: 02/16/2020  Lower Venous DVTStudy Indications: Swelling, and elevated d dimer.  Risk Factors: + covid. Performing Technologist: Jannet Askew RCT RDMS  Examination Guidelines: A complete evaluation includes B-mode imaging, spectral Doppler, color Doppler, and power Doppler as needed of all accessible portions of each vessel. Bilateral testing is considered an integral part of a complete examination. Limited examinations for reoccurring indications may be performed as noted. The reflux portion of the exam is performed with the patient in reverse Trendelenburg.  +---------+---------------+---------+-----------+----------+--------------+ RIGHT    CompressibilityPhasicitySpontaneityPropertiesThrombus Aging +---------+---------------+---------+-----------+----------+--------------+ CFV      Full           Yes      Yes                                 +---------+---------------+---------+-----------+----------+--------------+ SFJ      Full                                                         +---------+---------------+---------+-----------+----------+--------------+ FV Prox  Full                                                        +---------+---------------+---------+-----------+----------+--------------+ FV Mid   Full                                                        +---------+---------------+---------+-----------+----------+--------------+ FV DistalFull                                                        +---------+---------------+---------+-----------+----------+--------------+ PFV      Full                                                        +---------+---------------+---------+-----------+----------+--------------+ POP      Full           Yes      Yes                                 +---------+---------------+---------+-----------+----------+--------------+ PTV      Full                                                        +---------+---------------+---------+-----------+----------+--------------+ PERO     Full                                                        +---------+---------------+---------+-----------+----------+--------------+   +---------+---------------+---------+-----------+----------+--------------+  LEFT     CompressibilityPhasicitySpontaneityPropertiesThrombus Aging +---------+---------------+---------+-----------+----------+--------------+ CFV      Full           Yes      Yes                                 +---------+---------------+---------+-----------+----------+--------------+ SFJ      Full                                                        +---------+---------------+---------+-----------+----------+--------------+ FV Prox  Full                                                        +---------+---------------+---------+-----------+----------+--------------+ FV Mid   Full                                                         +---------+---------------+---------+-----------+----------+--------------+ FV DistalFull                                                        +---------+---------------+---------+-----------+----------+--------------+ PFV      Full                                                        +---------+---------------+---------+-----------+----------+--------------+ POP      Full           Yes      Yes                                 +---------+---------------+---------+-----------+----------+--------------+ PTV      Full                                                        +---------+---------------+---------+-----------+----------+--------------+ PERO     Full                                                        +---------+---------------+---------+-----------+----------+--------------+     Summary: RIGHT: - There is no evidence of deep vein thrombosis in the lower extremity.  - No cystic structure found in the popliteal fossa.  LEFT: - There is no evidence of deep vein thrombosis in the lower extremity.  - No  cystic structure found in the popliteal fossa.  *See table(s) above for measurements and observations. Electronically signed by Coral Else MD on 02/16/2020 at 9:33:24 PM.    Final    ECHOCARDIOGRAM LIMITED  Result Date: 02/16/2020    ECHOCARDIOGRAM LIMITED REPORT   Patient Name:   TOMER CHALMERS Date of Exam: 02/16/2020 Medical Rec #:  [id number]       Height:       72.0 in Accession #:    [id number]      Weight:       183.0 lb Date of Birth:  05/09/1935       BSA:          2.052 m Patient Age:    84 years        BP:           114/53 mmHg Patient Gender: M               HR:           77 bpm. Exam Location:  Inpatient Procedure: Limited Echo, Cardiac Doppler and Color Doppler Indications:    Elevated Troponin  History:        Patient has prior history of Echocardiogram examinations, most                 recent 02/17/2019. CHF and Cardiomyopathy, CAD, Arrythmias:Atrial                  Fibrillation, Signs/Symptoms:Shortness of Breath and Fever; Risk                 Factors:Hypertension, Dyslipidemia, OBESITY and Diabetes.                 COVID+.  Sonographer:    Lavenia Atlas Referring Phys: [id number] Kasandra Knudsen PAHWANI IMPRESSIONS  1. Left ventricular ejection fraction, by estimation, is 55 to 60%.  2. Right ventricular systolic function is normal. The right ventricular size is normal. There is normal pulmonary artery systolic pressure.  3. The interatrial septum is aneuyrsmal with frequent bowing from right to left.  4. The mitral valve is normal in structure. Trivial mitral valve regurgitation.  5. There is mild calcification of the aortic valve. There is mild thickening of the aortic valve. Mild aortic valve sclerosis is present, with no evidence of aortic valve stenosis. Comparison(s): Compared to prior echo on 01/2019, the LVEF appears mildly improved to 55-60%. FINDINGS  Left Ventricle: Septal motion is abnormal consistent with prior surgery. Left ventricular ejection fraction, by estimation, is 55 to 60%. The left ventricular internal cavity size was normal in size. There is no left ventricular hypertrophy. Right Ventricle: The right ventricular size is normal. Right ventricular systolic function is normal. There is normal pulmonary artery systolic pressure. The tricuspid regurgitant velocity is 2.41 m/s, and with an assumed right atrial pressure of 3 mmHg,  the estimated right ventricular systolic pressure is 26.2 mmHg. Left Atrium: Left atrial size was normal in size. Pericardium: There is no evidence of pericardial effusion. Mitral Valve: The mitral valve is normal in structure. There is mild thickening of the mitral valve leaflet(s). Mild mitral annular calcification. Trivial mitral valve regurgitation. Tricuspid Valve: The tricuspid valve is normal in structure. Tricuspid valve regurgitation is mild. Aortic Valve: There is mild calcification of the aortic valve. There is  mild thickening of the aortic valve. Mild aortic valve sclerosis is present, with no evidence of aortic valve stenosis. Pulmonic Valve: The pulmonic valve was not assessed.  Aorta: The aortic root is normal in size and structure. LEFT VENTRICLE PLAX 2D LVIDd:         3.80 cm  Diastology LVIDs:         2.70 cm  LV e' medial:    7.51 cm/s LV PW:         1.20 cm  LV E/e' medial:  7.7 LV IVS:        0.90 cm  LV e' lateral:   5.98 cm/s LVOT diam:     1.90 cm  LV E/e' lateral: 9.7 LVOT Area:     2.84 cm  RIGHT VENTRICLE RV S prime:     13.30 cm/s LEFT ATRIUM         Index LA diam:    2.80 cm 1.36 cm/m   AORTA Ao Root diam: 2.50 cm MITRAL VALVE               TRICUSPID VALVE MV Area (PHT): 5.38 cm    TR Peak grad:   23.2 mmHg MV Decel Time: 141 msec    TR Vmax:        241.00 cm/s MV E velocity: 57.80 cm/s MV A velocity: 70.30 cm/s  SHUNTS MV E/A ratio:  0.82        Systemic Diam: 1.90 cm Laurance Flatten MD Electronically signed by Laurance Flatten MD Signature Date/Time: 02/16/2020/4:23:30 PM    Final    ABORTED INVASIVE LAB PROCEDURE  Result Date: 02/15/2020 This case was aborted.  US Abdomen Limited RUQ  Result Date: 02/15/2020 CLINICAL DATA:  Elevated liver enzymes. EXAM: ULTRASOUND ABDOMEN LIMITED RIGHT UPPER QUADRANT COMPARISON:  CT abdomen 07/06/2019. FINDINGS: Gallbladder: Gallstones measuring up to 7 mm noted. Gallbladder wall thickness normal. No Murphy sign. Common bile duct: Diameter: Not visualized due to overlying bowel gas. Liver: Left no not visualized due to overlying bowel gas. No focal lesion identified. Within normal limits in parenchymal echogenicity. Portal vein is patent on color Doppler imaging with normal direction of blood flow towards the liver. Other: Limited study due to overlying bowel gas. IMPRESSION: 1.  Multiple small gallstones.  No gallbladder wall thickening. 2. Limited exam due to overlying bowel gas. Common bile duct not visualized. Portions of the liver not visualized.  Electronically Signed   By: Maisie Fus  Register   On: 02/15/2020 15:35

## 2020-02-23 NOTE — Progress Notes (Signed)
This AM patient's CBG was 52; patient asymptomatic, no complaints. After a cup of orange juice and breakfast, CBG came up to 92. MD was notified.

## 2020-02-23 NOTE — Evaluation (Signed)
Physical Therapy Evaluation Patient Details Name: Gordon Novak MRN: [id number] DOB: [birth year] Today's Date: 02/23/2020   History of Present Illness  Pt is an 85 y.o. male who tested (+) COVID-19 on 02/07/20, now admitted 02/15/20 with hypoxia; workup for acute hypoxemic respiratory failure due to COVID-19 PNA with severe lung parenchymal injury. PMH includes CAD, HTN, HLD, DM2, PE; pt fully vaccinated.    Clinical Impression  Pt presents with an overall decrease in functional mobility secondary to above. PTA, pt independent, active, drives and lives with supportive wife. Initiated education re: current condition, O2 needs, activity recommendations, therex, energy conservation, and importance of mobility. Today, pt able to perform seated and standing activity with minA for balance; limited by SOB and hypoxia, only toleraing ~10-sec of activity before needing seated rest. SpO2 down to 77% on 25 L O2 HHFNC at 80% FiO2. Pt would benefit from continued acute PT services to maximize functional mobility and independence prior to d/c with HHPT services.    Follow Up Recommendations Home health PT;Supervision for mobility/OOB    Equipment Recommendations   (TBD)    Recommendations for Other Services       Precautions / Restrictions Precautions Precautions: Fall;Other (comment) Precaution Comments: Quick to desaturate - on 25L O2 HHFNC at 80% FiO2; chronic LLE swelling (negative DVT) Restrictions Weight Bearing Restrictions: No      Mobility  Bed Mobility Overal bed mobility: Needs Assistance Bed Mobility: Supine to Sit     Supine to sit: Supervision;HOB elevated        Transfers Overall transfer level: Needs assistance Equipment used: 1 person hand held assist Transfers: Sit to/from Stand Sit to Stand: Min assist         General transfer comment: MinA for HHA to elevate trunk and maintain balance standing from EOB; close min guard standing from  recliner  Ambulation/Gait Ambulation/Gait assistance: Min assist Gait Distance (Feet): 8 Feet Assistive device: 1 person hand held assist Gait Pattern/deviations: Step-to pattern;Trunk flexed Gait velocity: Decreased   General Gait Details: Steps forwards/backwards and 2x bouts of marching in place, all with prolonged seated rest to recover between activity bouts secondary to SOB and desaturation. MinA for HHA to maintain balance  Stairs            Wheelchair Mobility    Modified Rankin (Stroke Patients Only)       Balance Overall balance assessment: Needs assistance   Sitting balance-Leahy Scale: Fair       Standing balance-Leahy Scale: Fair Standing balance comment: Can static stand without UE support, dynamic stability improved with UE support                             Pertinent Vitals/Pain Pain Assessment: No/denies pain    Home Living Family/patient expects to be discharged to:: Private residence Living Arrangements: Spouse/significant other Available Help at Discharge: Family;Available 24 hours/day Type of Home: House Home Access: Stairs to enter Entrance Stairs-Rails: Doctor, general practice of Steps: 3 Home Layout: One level Home Equipment: Walker - 2 wheels Additional Comments: Reports wife in good health and independent, did not get sick    Prior Function Level of Independence: Independent         Comments: Active, drives, enjoys going to church and traveling with wife     Hand Dominance        Extremity/Trunk Assessment   Upper Extremity Assessment Upper Extremity Assessment: Generalized weakness    Lower Extremity  Assessment Lower Extremity Assessment: Generalized weakness;LLE deficits/detail LLE Deficits / Details: reports chronic LLE swelling, but this has improved since admission       Communication   Communication: No difficulties  Cognition Arousal/Alertness: Awake/alert Behavior During Therapy:  WFL for tasks assessed/performed Overall Cognitive Status: Within Functional Limits for tasks assessed                                 General Comments: WFL for simple tasks, not formally assessed      General Comments General comments (skin integrity, edema, etc.): Pt on 25L O2 HHFNC at 80% FiO2 -- SpO2 down to 64% with standing (good pleth via finger probe - pt SOB, but not significantly symptomatic) - switched to ear probe with SpO2 down to 77% standing activity, returning to 88-91% with prolonged seated rest break, HR 93    Exercises Other Exercises Other Exercises: incentive spirometer, cues for technique (pt pulling ~714mL after bouts of standing exercise) Other Exercises: Steps to recliner, seated rest, then 2x bouts marching in place for ~10-sec before needing sitting rest between bouts   Assessment/Plan    PT Assessment Patient needs continued PT services  PT Problem List Decreased strength;Decreased activity tolerance;Decreased balance;Decreased mobility;Decreased knowledge of use of DME;Cardiopulmonary status limiting activity;Decreased knowledge of precautions       PT Treatment Interventions DME instruction;Gait training;Stair training;Functional mobility training;Therapeutic activities;Therapeutic exercise;Balance training;Patient/family education    PT Goals (Current goals can be found in the Care Plan section)  Acute Rehab PT Goals Patient Stated Goal: "I want to get back home as soon as I can... I hope our traveling days have not come to an end" PT Goal Formulation: With patient Time For Goal Achievement: 03/08/20 Potential to Achieve Goals: Fair    Frequency Min 3X/week   Barriers to discharge        Co-evaluation               AM-PAC PT "6 Clicks" Mobility  Outcome Measure Help needed turning from your back to your side while in a flat bed without using bedrails?: None Help needed moving from lying on your back to sitting on the side of  a flat bed without using bedrails?: None Help needed moving to and from a bed to a chair (including a wheelchair)?: A Little Help needed standing up from a chair using your arms (e.g., wheelchair or bedside chair)?: A Little Help needed to walk in hospital room?: A Little Help needed climbing 3-5 steps with a railing? : A Little 6 Click Score: 20    End of Session Equipment Utilized During Treatment: Oxygen Activity Tolerance: Patient tolerated treatment well;Treatment limited secondary to medical complications (Comment) Patient left: in chair;with call bell/phone within reach Nurse Communication: Mobility status PT Visit Diagnosis: Other abnormalities of gait and mobility (R26.89)    Time: 3151-7616 PT Time Calculation (min) (ACUTE ONLY): 24 min   Charges:   PT Evaluation $PT Eval Moderate Complexity: 1 Mod PT Treatments $Therapeutic Activity: 8-22 mins      Ina Homes, PT, DPT Acute Rehabilitation Services  Pager [phone] Office [phone]  Malachy Chamber 02/23/2020, 10:15 AM

## 2020-02-23 NOTE — Progress Notes (Signed)
Inpatient Diabetes Program Recommendations  AACE/ADA: New Consensus Statement on Inpatient Glycemic Control   Target Ranges:  Prepandial:   less than 140 mg/dL      Peak postprandial:   less than 180 mg/dL (1-2 hours)      Critically ill patients:  140 - 180 mg/dL   Results for Gordon Novak, [name] (MRN [id number]) as of 02/23/2020 10:17  Ref. Range 02/23/2020 08:05 02/23/2020 08:44  Glucose-Capillary Latest Ref Range: 70 - 99 mg/dL 53 (L) 92   Review of Glycemic Control  Diabetes history: DM2 Outpatient Diabetes medications: Metformin XR 500 mg QAM Current orders for Inpatient glycemic control: Levemir 15 units daily, Tradjenta 5 QD, Novolog 0-15 units TID with meals, Novolog 0-5 units QHS; Solumedrol 20 mg Q12H  Inpatient Diabetes Program Recommendations:    Insulin: Fasting glucose 53 mg/dl today and patient has already received Levemir 15 units this morning. Please consider decreasing Levemir to 10 units daily (to start 02/24/20).  Thanks, Orlando Penner, RN, MSN, CDE Diabetes Coordinator Inpatient Diabetes Program [phone] (Team Pager from 8am to 5pm)

## 2020-02-24 ENCOUNTER — Encounter (HOSPITAL_COMMUNITY): Payer: Self-pay | Admitting: Internal Medicine

## 2020-02-24 ENCOUNTER — Inpatient Hospital Stay (HOSPITAL_COMMUNITY): Payer: Medicare Other

## 2020-02-24 DIAGNOSIS — I259 Chronic ischemic heart disease, unspecified: Secondary | ICD-10-CM | POA: Diagnosis not present

## 2020-02-24 DIAGNOSIS — E119 Type 2 diabetes mellitus without complications: Secondary | ICD-10-CM | POA: Diagnosis not present

## 2020-02-24 DIAGNOSIS — U071 COVID-19: Secondary | ICD-10-CM | POA: Diagnosis not present

## 2020-02-24 DIAGNOSIS — R748 Abnormal levels of other serum enzymes: Secondary | ICD-10-CM | POA: Diagnosis not present

## 2020-02-24 LAB — COMPREHENSIVE METABOLIC PANEL
ALT: 94 U/L — ABNORMAL HIGH (ref 0–44)
AST: 53 U/L — ABNORMAL HIGH (ref 15–41)
Albumin: 2.3 g/dL — ABNORMAL LOW (ref 3.5–5.0)
Alkaline Phosphatase: 95 U/L (ref 38–126)
Anion gap: 10 (ref 5–15)
BUN: 29 mg/dL — ABNORMAL HIGH (ref 8–23)
CO2: 27 mmol/L (ref 22–32)
Calcium: 8 mg/dL — ABNORMAL LOW (ref 8.9–10.3)
Chloride: 99 mmol/L (ref 98–111)
Creatinine, Ser: 0.93 mg/dL (ref 0.61–1.24)
GFR calc Af Amer: >60 mL/min (ref >60)
GFR calc non Af Amer: >60 mL/min (ref >60)
Glucose, Bld: 216 mg/dL — ABNORMAL HIGH (ref 70–99)
Potassium: 4.5 mmol/L (ref 3.5–5.1)
Sodium: 136 mmol/L (ref 135–145)
Total Bilirubin: 0.9 mg/dL (ref 0.3–1.2)
Total Protein: 5.5 g/dL — ABNORMAL LOW (ref 6.5–8.1)

## 2020-02-24 LAB — CBC
HCT: 45.1 % (ref 39.0–52.0)
Hemoglobin: 15.1 g/dL (ref 13.0–17.0)
MCH: 31.5 pg (ref 26.0–34.0)
MCHC: 33.5 g/dL (ref 30.0–36.0)
MCV: 94 fL (ref 80.0–100.0)
Platelets: 296 10*3/uL (ref 150–400)
RBC: 4.8 MIL/uL (ref 4.22–5.81)
RDW: 13.1 % (ref 11.5–15.5)
WBC: 15.3 10*3/uL — ABNORMAL HIGH (ref 4.0–10.5)
nRBC: 0 % (ref 0.0–0.2)

## 2020-02-24 LAB — D-DIMER, QUANTITATIVE: D-Dimer, Quant: 2.26 ug/mL-FEU — ABNORMAL HIGH (ref 0.00–0.50)

## 2020-02-24 LAB — C-REACTIVE PROTEIN: CRP: 1.5 mg/dL — ABNORMAL HIGH (ref <1.0)

## 2020-02-24 MED ORDER — POLYETHYLENE GLYCOL 3350 17 G PO PACK
17.0000 g | PACK | Freq: Two times a day (BID) | ORAL | Status: DC
  Administered 2020-02-24 – 2020-03-08 (×18): 17 g via ORAL
  Filled 2020-02-24 (×24): qty 1

## 2020-02-24 MED ORDER — INSULIN DETEMIR 100 UNIT/ML ~~LOC~~ SOLN: 10.0000 [IU] | Freq: Every day | SUBCUTANEOUS | Status: DC

## 2020-02-24 MED ORDER — METHYLPREDNISOLONE SODIUM SUCC 125 MG IJ SOLR
60.0000 mg | Freq: Two times a day (BID) | INTRAMUSCULAR | Status: DC
  Administered 2020-02-24 – 2020-02-28 (×8): 60 mg via INTRAVENOUS
  Filled 2020-02-24 (×8): qty 2

## 2020-02-24 MED ORDER — FUROSEMIDE 40 MG PO TABS
40.0000 mg | ORAL_TABLET | Freq: Every day | ORAL | Status: DC
  Administered 2020-02-25 – 2020-02-27 (×3): 40 mg via ORAL
  Filled 2020-02-24 (×3): qty 1

## 2020-02-24 MED ORDER — BISACODYL 10 MG RE SUPP: 10.0000 mg | Freq: Every day | RECTAL | Status: DC | PRN

## 2020-02-24 MED ORDER — SENNOSIDES-DOCUSATE SODIUM 8.6-50 MG PO TABS
2.0000 | ORAL_TABLET | Freq: Every day | ORAL | Status: DC
  Administered 2020-02-24 – 2020-03-06 (×10): 2 via ORAL
  Filled 2020-02-24 (×12): qty 2

## 2020-02-24 MED ORDER — INSULIN DETEMIR 100 UNIT/ML ~~LOC~~ SOLN
10.0000 [IU] | Freq: Every day | SUBCUTANEOUS | Status: DC
  Administered 2020-02-25 – 2020-02-29 (×5): 10 [IU] via SUBCUTANEOUS
  Filled 2020-02-24 (×6): qty 0.1

## 2020-02-24 MED ORDER — INSULIN DETEMIR 100 UNIT/ML ~~LOC~~ SOLN
10.0000 [IU] | Freq: Every day | SUBCUTANEOUS | Status: DC
  Filled 2020-02-24: qty 0.1

## 2020-02-24 NOTE — Progress Notes (Signed)
Physical Therapy Treatment Patient Details Name: Gordon Novak MRN: [id number] DOB: 04/22/1935 Today's Date: 02/24/2020    History of Present Illness Pt is an 84 y.o. male who tested (+) COVID-19 on 02/07/20, now admitted 02/15/20 with hypoxia; workup for acute hypoxemic respiratory failure due to COVID-19 PNA with severe lung parenchymal injury. PMH includes CAD, HTN, HLD, DM2, PE; pt fully vaccinated.   PT Comments    Pt slowly progressing with mobility. Today's session focused on transfer training with RW, as well as seated and standing therex. SpO2 down to 71% with activity on 25L O2 HHFNC at 70% FiO2. Pt able to tolerate short activity bouts with prolonged seated rest to recover SOB and desaturation. Pt motivated to participate and regain PLOF. Will continue to follow acutely.    Follow Up Recommendations  Home health PT;Supervision for mobility/OOB     Equipment Recommendations   (TBD-owns RW)    Recommendations for Other Services       Precautions / Restrictions Precautions Precautions: Fall;Other (comment) Precaution Comments: Quick to desaturate - on 25L O2 HHFNC at 70% FiO2; chronic LLE swelling (negative DVT) Restrictions Weight Bearing Restrictions: No    Mobility  Bed Mobility               General bed mobility comments: Received sitting in recliner  Transfers Overall transfer level: Needs assistance Equipment used: Rolling walker (2 wheeled) Transfers: Sit to/from Stand Sit to Stand: Min guard         General transfer comment: Multiple sit<>stands from recliner to RW with min guard for balance; initial poor eccentric control into sitting, cues for hand placement and technique improved  Ambulation/Gait Ambulation/Gait assistance: Min guard Gait Distance (Feet): 0 Feet Assistive device: Rolling walker (2 wheeled)   Gait velocity: Decreased   General Gait Details: Bouts of marching in place with min guard, stability improved with RW   Stairs              Wheelchair Mobility    Modified Rankin (Stroke Patients Only)       Balance Overall balance assessment: Needs assistance   Sitting balance-Leahy Scale: Fair     Standing balance support: During functional activity Standing balance-Leahy Scale: Poor Standing balance comment: Reliant on BUE support to maintain standing balance                            Cognition Arousal/Alertness: Awake/alert Behavior During Therapy: WFL for tasks assessed/performed Overall Cognitive Status: Within Functional Limits for tasks assessed                                 General Comments: WFL for simple tasks, not formally assessed      Exercises Other Exercises Other Exercises: Bouts of marching in place (tolerates ~30-sec intervals), 2x repeated sit<>stand, seated marching/LAQ/calf raises - requires 2-3-min seated rest to recover SOB and O2 with cues for pursed lip breathing Other Exercises: IS x10 (good technique) pulling 750-1097mL    General Comments General comments (skin integrity, edema, etc.): SpO2 down to 77% with HHFNC removed to clean out blood clots from nose. Pt on 25L O2 HHFNC at 70% FiO2 -- SpO2 down to 71% with activity, returning to >/88% with prolonged seated rest and pursed lip breathing      Pertinent Vitals/Pain Pain Assessment: No/denies pain    Home Living  Prior Function            PT Goals (current goals can now be found in the care plan section) Progress towards PT goals: Progressing toward goals    Frequency    Min 3X/week      PT Plan Current plan remains appropriate    Co-evaluation              AM-PAC PT "6 Clicks" Mobility   Outcome Measure  Help needed turning from your back to your side while in a flat bed without using bedrails?: None Help needed moving from lying on your back to sitting on the side of a flat bed without using bedrails?: None Help needed moving to  and from a bed to a chair (including a wheelchair)?: A Little Help needed standing up from a chair using your arms (e.g., wheelchair or bedside chair)?: A Little Help needed to walk in hospital room?: A Little Help needed climbing 3-5 steps with a railing? : A Little 6 Click Score: 20    End of Session Equipment Utilized During Treatment: Oxygen Activity Tolerance: Patient tolerated treatment well;Treatment limited secondary to medical complications (Comment) Patient left: in chair;with call bell/phone within reach Nurse Communication: Mobility status PT Visit Diagnosis: Other abnormalities of gait and mobility (R26.89)     Time: 1829-9371 PT Time Calculation (min) (ACUTE ONLY): 29 min  Charges:  $Therapeutic Exercise: 8-22 mins $Therapeutic Activity: 8-22 mins                     Ina Homes, PT, DPT Acute Rehabilitation Services  Pager [phone] Office [phone]  Malachy Chamber 02/24/2020, 10:45 AM

## 2020-02-24 NOTE — Progress Notes (Signed)
PROGRESS NOTE                                                                                                                                                                                                             Patient Demographics:    Gordon Novak, is a 85 y.o. male, DOB - 11/30/1934, ZOX:[id number]  Outpatient Primary MD for the patient is [name], MD   Admit date - 02/15/2020   LOS - 9  No chief complaint on file.      Brief Narrative: Patient is a 85 y.o. male with PMHx of CAD s/p PCI 2020, DM-2, HLD, HTN, history of PE no longer on anticoagulation-presented with shortness of breath-found to have severe hypoxemia requiring high flow oxygen-due to COVID-19 pneumonia.  COVID-19 vaccinated status: Vaccinated  Significant Events: 9/21>> Admit to [facility] for hypoxia due to COVID-19 pneumonia  Significant studies: 9/21>>Chest x-ray: Diffuse lung infiltrates 9/22>> CTA chest: No PE-severe bilateral patchy/groundglass appearing infiltrates, cholelithiasis 9/22>> bilateral lower extremity Doppler: No DVT 9/22>> Echo: EF 55-60%, RV systolic function normal. 9/30>> chest x-ray: Patchy bilateral airspace disease-not significantly changed from prior.  COVID-19 medications: Steroids: 9/21>> Remdesivir: 9/21>> 9/25 Baricitinib: 9/21>>  Antibiotics: Rocephin: 9/22>> 9/26 Zithromax: 9/22>> 9/26  Microbiology data: 9/21 >>blood culture: No growth  Procedures: None  Consults: Cardiology  DVT prophylaxis: enoxaparin (LOVENOX) injection 40 mg at twice daily dosing (intermediate dosing) SCDs Start: 02/15/20 1300    Subjective:    Gordon Novak today remains comfortable at rest-acknowledges shortness of breath with minimal ambulation.   Assessment  & Plan :   Acute Hypoxic Resp Failure due to Covid 19 Viral pneumonia: Continues to have severe hypoxemia-remains on heated high flow-RT/RN titrating down  FiO2 needs.  Finished course of Remdesivir-remains on IV steroids and baricitinib.  Has chronic left lower extremity edema-but no obvious signs of volume overload-continue oral Lasix to ensure negative balance.  Continue with attempts to titrate down FiO2.   Fever: afebrile O2 requirements:  SpO2: 92 % O2 Flow Rate (L/min): 25 L/min FiO2 (%): 70 %   COVID-19 Labs: Recent Labs    02/23/20 0946  DDIMER 2.82*  CRP 0.6       Component Value Date/Time   BNP 199.1 (H) 02/15/2020 1040    Recent Labs  Lab 02/18/20 0358 02/19/20 0226  PROCALCITON 0.47 0.26  Lab Results  Component Value Date   SARSCOV2NAA NEGATIVE 10/22/2018   SARSCOV2NAA NEGATIVE 10/18/2018    Prone/Incentive Spirometry: encouraged patient to lie prone for 3-4 hours at a time for a total of 16 hours a day, and to encourage incentive spirometry use 3-4/hour.  Elevated troponins: Minimally elevated troponins with flat trend-not consistent with ACS-echo with preserved EF.  Cardiology evaluation completed-no recommendations apart from supportive care.    Minimally elevated D-dimer: Downtrending-has history of PE in 2020-(completed 1 year of treatment with Xarelto in May 2021)-CTA chest/Dopplers negative-remains on intermediate dosing of Lovenox.  Transaminitis: Likely secondary to COVID-19-remains minimally elevated but slowly downtrending-continue to follow-supportive care for now.  Chronic systolic heart failure: Euvolemic on exam.  Continue oral Lasix-follow volume status/electrolytes closely.   CAD: No chest pain this morning-evaluated by cardiology on initial presentat laboratory was) Kaneshiro yesterday ion due to concern for EKG changes-recommendations are to continue aspirin/statin/beta-blocker.  DM-2 (A1c 7.1 on 9/21): Hypoglycemic episode earlier-decrease Levemir to 10 units-continue SSI and follow.  Recent Labs    02/23/20 0805 02/23/20 0844 02/23/20 1232  GLUCAP 53* 92 209*    Gout: Continue  allopurinol  BPH: Continue Flomax  Chronic left lower extremity swelling: At baseline-lower extremity Dopplers negative.  ABG: No results found for: PHART, PCO2ART, PO2ART, HCO3, TCO2, ACIDBASEDEF, O2SAT  Vent Settings: N/A FiO2 (%):  [70 %-80 %] 70 %  Condition - Extremely Guarded  Family Communication  :  Daughter [name] [phone])  updated over the phone 9/30  Code Status :  Full Code-ongoing discussion regarding CODE STATUS with patient/daughter since 9/29-although patient seems to be leaning towards DNR-he still wants to discuss with family-patient's daughter (spoke to on 9/30) will let me know if they are ready to change his CODE STATUS to DNR.   Diet :  Diet Order            Diet heart healthy/carb modified Room service appropriate? No; Fluid consistency: Thin  Diet effective now                  Disposition Plan  :   Status is: Inpatient  Remains inpatient appropriate because:Inpatient level of care appropriate due to severity of illness   Dispo: The patient is from: Home              Anticipated d/c is to: Home              Anticipated d/c date is: > 3 days              Patient currently is not medically stable to d/c.  Barriers to discharge: Severe hypoxemia requiring heated high flow  Antimicorbials  :    Anti-infectives (From admission, onward)   Start     Dose/Rate Route Frequency Ordered Stop   02/16/20 1330  azithromycin (ZITHROMAX) 500 mg in sodium chloride 0.9 % 250 mL IVPB        500 mg 250 mL/hr over 60 Minutes Intravenous Every 24 hours 02/16/20 1237 02/20/20 1320   02/16/20 1300  cefTRIAXone (ROCEPHIN) 2 g in sodium chloride 0.9 % 100 mL IVPB        2 g 200 mL/hr over 30 Minutes Intravenous Every 24 hours 02/16/20 1237 02/20/20 1252   02/16/20 1000  remdesivir 100 mg in sodium chloride 0.9 % 100 mL IVPB       "Followed by" Linked Group Details   100 mg 200 mL/hr over 30 Minutes Intravenous Daily 02/15/20 1301 02/19/20  6045   02/15/20 1400   remdesivir 200 mg in sodium chloride 0.9% 250 mL IVPB       "Followed by" Linked Group Details   200 mg 580 mL/hr over 30 Minutes Intravenous Once 02/15/20 1301 02/15/20 1504      Inpatient Medications  Scheduled Meds: . allopurinol  300 mg Oral Daily  . vitamin C  500 mg Oral Daily  . aspirin EC  81 mg Oral Daily  . baricitinib  4 mg Oral Daily  . enoxaparin (LOVENOX) injection  40 mg Subcutaneous BID  . furosemide  20 mg Oral Daily  . insulin aspart  0-15 Units Subcutaneous TID WC  . insulin aspart  0-5 Units Subcutaneous QHS  . insulin detemir  15 Units Subcutaneous Daily  . linagliptin  5 mg Oral Daily  . methylPREDNISolone (SOLU-MEDROL) injection  80 mg Intravenous Q12H  . metoprolol succinate  12.5 mg Oral QHS  . pantoprazole  40 mg Oral Daily  . polyethylene glycol  17 g Oral BID  . prednisoLONE acetate  1 drop Both Eyes QHS  . rosuvastatin  10 mg Oral Daily  . senna-docusate  2 tablet Oral QHS  . tamsulosin  0.4 mg Oral QHS  . zinc sulfate  220 mg Oral Daily   Continuous Infusions: . sodium chloride 10 mL/hr at 02/17/20 0500   PRN Meds:.acetaminophen, bisacodyl, chlorpheniramine-HYDROcodone, guaiFENesin-dextromethorphan, ondansetron **OR** ondansetron (ZOFRAN) IV   Time Spent in minutes  35     See all Orders from today for further details   Jeoffrey Massed M.D on 02/24/2020 at 9:39 AM  To page go to [email or web address] - use universal password  [facility] -  Office  [phone]    Objective:   Vitals:   02/24/20 0350 02/24/20 0815 02/24/20 0859 02/24/20 0922  BP: (!) 112/50 (!) 113/59    Pulse: 63 77    Resp: 16 13    Temp: 97.8 F (36.6 C) (!) 97.1 F (36.2 C)    TempSrc: Oral Axillary    SpO2: 96% 92% 91% 92%  Weight:      Height:        Wt Readings from Last 3 Encounters:  02/15/20 83 kg  02/09/20 83.9 kg  08/30/19 83 kg     Intake/Output Summary (Last 24 hours) at 02/24/2020 0939 Last data filed at 02/24/2020 0050 Gross per 24  hour  Intake --  Output 850 ml  Net -850 ml     Physical Exam Gen Exam:Alert awake-not in any distress HEENT:atraumatic, normocephalic Chest: B/L clear to auscultation anteriorly CVS:S1S2 regular Abdomen:soft non tender, non distended Extremities: Chronic left lower extremity edema-no edema on the right leg. Neurology: Non focal Skin: no rash   Data Review:    CBC Recent Labs  Lab 02/17/20 1136 02/17/20 1136 02/18/20 0358 02/18/20 0358 02/19/20 0226 02/20/20 0338 02/21/20 0458 02/22/20 0516 02/23/20 0946  WBC 12.4*   < > 11.2*   < > 8.1 8.9 11.4* 13.1* 19.8*  HGB 15.4   < > 13.6   < > 13.2 13.1 13.0 13.1 15.3  HCT 45.7   < > 39.9   < > 39.5 38.7* 38.8* 39.8 46.6  PLT 290   < > 275   < > 250 252 237 239 317  MCV 92.7   < > 92.6   < > 92.7 93.3 93.3 93.6 93.6  MCH 31.2   < > 31.6   < > 31.0 31.6 31.3 30.8 30.7  MCHC 33.7   < >  34.1   < > 33.4 33.9 33.5 32.9 32.8  RDW 13.5   < > 13.4   < > 13.3 13.4 13.2 13.2 13.2  LYMPHSABS 0.8  --  0.6*  --  0.3* 0.2*  --   --   --   MONOABS 0.4  --  0.3  --  0.2 0.2  --   --   --   EOSABS 0.0  --  0.0  --  0.0 0.0  --   --   --   BASOSABS 0.0  --  0.0  --  0.0 0.0  --   --   --    < > = values in this interval not displayed.    Chemistries  Recent Labs  Lab 02/17/20 1136 02/17/20 1136 02/18/20 0358 02/19/20 0226 02/20/20 0338 02/22/20 0516 02/23/20 0946  NA 141   < > 141 139 139 140 137  K 4.1   < > 4.4 4.5 4.5 4.7 4.3  CL 110   < > 108 108 107 106 104  CO2 18*   < > 22 23 23 28 23   GLUCOSE 252*   < > 214* 191* 211* 143* 124*  BUN 33*   < > 34* 36* 35* 36* 36*  CREATININE 1.07   < > 0.96 0.95 0.92 0.92 0.95  CALCIUM 8.2*   < > 8.0* 7.7* 7.8* 7.9* 8.1*  MG 2.5*  --  2.7* 2.5* 2.7*  --   --   AST 63*   < > 82* 73* 58* 30 54*  ALT 114*   < > 119* 117* 109* 74* 83*  ALKPHOS 107   < > 96 101 93 83 95  BILITOT 0.8   < > 0.6 0.5 0.8 1.0 1.2   < > = values in this interval not displayed.    ------------------------------------------------------------------------------------------------------------------ No results for input(s): CHOL, HDL, LDLCALC, TRIG, CHOLHDL, LDLDIRECT in the last 72 hours.  Lab Results  Component Value Date   HGBA1C 7.1 (H) 02/15/2020   ------------------------------------------------------------------------------------------------------------------ No results for input(s): TSH, T4TOTAL, T3FREE, THYROIDAB in the last 72 hours.  Invalid input(s): FREET3 ------------------------------------------------------------------------------------------------------------------ No results for input(s): VITAMINB12, FOLATE, FERRITIN, TIBC, IRON, RETICCTPCT in the last 72 hours.  Coagulation profile No results for input(s): INR, PROTIME in the last 168 hours.  Recent Labs    02/23/20 0946  DDIMER 2.82*    Cardiac Enzymes No results for input(s): CKMB, TROPONINI, MYOGLOBIN in the last 168 hours.  Invalid input(s): CK ------------------------------------------------------------------------------------------------------------------    Component Value Date/Time   BNP 199.1 (H) 02/15/2020 1040    Micro Results Recent Results (from the past 240 hour(s))  Culture, blood (Routine X 2) w Reflex to ID Panel     Status: None   Collection Time: 02/15/20  1:24 PM   Specimen: BLOOD  Result Value Ref Range Status   Specimen Description BLOOD RIGHT ANTECUBITAL  Final   Special Requests   Final    BOTTLES DRAWN AEROBIC AND ANAEROBIC Blood Culture adequate volume   Culture   Final    NO GROWTH 5 DAYS Performed at [facility], [address], [place], [place]    Report Status 02/20/2020 FINAL  Final  Culture, blood (Routine X 2) w Reflex to ID Panel     Status: None   Collection Time: 02/15/20  1:24 PM   Specimen: BLOOD  Result Value Ref Range Status   Specimen Description BLOOD BLOOD LEFT HAND  Final  Special Requests   Final    BOTTLES  DRAWN AEROBIC AND ANAEROBIC Blood Culture adequate volume   Culture   Final    NO GROWTH 5 DAYS Performed at [facility], [address] [place], [place] [address]    Report Status 02/20/2020 FINAL  Final    Radiology Reports CT Head Wo Contrast  Result Date: 02/09/2020 CLINICAL DATA:  Trauma, fall on blood thinners EXAM: CT HEAD WITHOUT CONTRAST CT CERVICAL SPINE WITHOUT CONTRAST TECHNIQUE: Multidetector CT imaging of the head and cervical spine was performed following the standard protocol without intravenous contrast. Multiplanar CT image reconstructions of the cervical spine were also generated. COMPARISON:  None. FINDINGS: CT HEAD FINDINGS Brain: No acute territorial infarction, hemorrhage, or intracranial mass. Moderate atrophy. Mild hypodensity in the white matter consistent with chronic small vessel ischemic change. The ventricles are nonenlarged. Vascular: No hyperdense vessels.  Carotid vascular calcification. Skull: No depressed skull fracture. Sinuses/Orbits: Mucosal thickening in the maxillary and ethmoid sinuses. Small fluid level in the left sphenoid sinus but no central skull base lucency is seen. Possible chronic left nasal bone fracture. Other: Mild right posterior scalp swelling. CT CERVICAL SPINE FINDINGS Alignment: No subluxation.  Facet alignment within normal limits. Skull base and vertebrae: No acute fracture. No primary bone lesion or focal pathologic process. Soft tissues and spinal canal: No prevertebral fluid or swelling. No visible canal hematoma. Disc levels: Multiple level degenerative change with moderate severe disease at C5-C6 and C6-C7. Facet degenerative changes at multiple levels with foraminal narrowing. Upper chest: Apical scarring Other: None IMPRESSION: 1. No CT evidence for acute intracranial abnormality. Atrophy and mild chronic small vessel ischemic changes of the white matter. 2. Degenerative changes of the cervical spine. No acute osseous abnormality.  Electronically Signed   By: Jasmine Pang M.D.   On: 02/09/2020 01:13   CT ANGIO CHEST PE W OR WO CONTRAST  Result Date: 02/16/2020 CLINICAL DATA:  COVID positive peer EXAM: CT ANGIOGRAPHY CHEST WITH CONTRAST TECHNIQUE: Multidetector CT imaging of the chest was performed using the standard protocol during bolus administration of intravenous contrast. Multiplanar CT image reconstructions and MIPs were obtained to evaluate the vascular anatomy. CONTRAST:  OMNIPAQUE IOHEXOL 350 MG/ML SOLN COMPARISON:  Oct 22, 2018 FINDINGS: Cardiovascular: There is moderate severity calcification of the thoracic aorta. Satisfactory opacification of the pulmonary arteries to the segmental level. No evidence of pulmonary embolism. Normal heart size. No pericardial effusion. Mediastinum/Nodes: No enlarged mediastinal, hilar, or axillary lymph nodes. Thyroid gland, trachea, and esophagus demonstrate no significant findings. Lungs/Pleura: Marked severity patchy and ground-glass appearing infiltrates are seen throughout both lungs. There is no evidence of a pleural effusion or pneumothorax. Upper Abdomen: Numerous subcentimeter gallstones are seen within the lumen of an otherwise normal-appearing gallbladder. Musculoskeletal: No chest wall abnormality. No acute or significant osseous findings. Review of the MIP images confirms the above findings. IMPRESSION: 1. Marked severity bilateral patchy and ground-glass appearing infiltrates. 2. Cholelithiasis. 3. Aortic atherosclerosis. Aortic Atherosclerosis (ICD10-I70.0). Electronically Signed   By: Aram Candela M.D.   On: 02/16/2020 22:13   CT Cervical Spine Wo Contrast  Result Date: 02/09/2020 CLINICAL DATA:  Trauma, fall on blood thinners EXAM: CT HEAD WITHOUT CONTRAST CT CERVICAL SPINE WITHOUT CONTRAST TECHNIQUE: Multidetector CT imaging of the head and cervical spine was performed following the standard protocol without intravenous contrast. Multiplanar CT image  reconstructions of the cervical spine were also generated. COMPARISON:  None. FINDINGS: CT HEAD FINDINGS Brain: No acute territorial infarction,  hemorrhage, or intracranial mass. Moderate atrophy. Mild hypodensity in the white matter consistent with chronic small vessel ischemic change. The ventricles are nonenlarged. Vascular: No hyperdense vessels.  Carotid vascular calcification. Skull: No depressed skull fracture. Sinuses/Orbits: Mucosal thickening in the maxillary and ethmoid sinuses. Small fluid level in the left sphenoid sinus but no central skull base lucency is seen. Possible chronic left nasal bone fracture. Other: Mild right posterior scalp swelling. CT CERVICAL SPINE FINDINGS Alignment: No subluxation.  Facet alignment within normal limits. Skull base and vertebrae: No acute fracture. No primary bone lesion or focal pathologic process. Soft tissues and spinal canal: No prevertebral fluid or swelling. No visible canal hematoma. Disc levels: Multiple level degenerative change with moderate severe disease at C5-C6 and C6-C7. Facet degenerative changes at multiple levels with foraminal narrowing. Upper chest: Apical scarring Other: None IMPRESSION: 1. No CT evidence for acute intracranial abnormality. Atrophy and mild chronic small vessel ischemic changes of the white matter. 2. Degenerative changes of the cervical spine. No acute osseous abnormality. Electronically Signed   By: Jasmine Pang M.D.   On: 02/09/2020 01:13   DG Chest Port 1 View  Result Date: 02/15/2020 CLINICAL DATA:  COVID positive.  Hypoxia. EXAM: PORTABLE CHEST 1 VIEW COMPARISON:  Oct 22, 2018 FINDINGS: Diffuse bilateral interstitial and airspace opacities, new from prior. No visible pleural effusion. No pneumothorax. Cardiac silhouette is accentuated by low lung volumes and portable technique. Aortic atherosclerosis. IMPRESSION: Diffuse bilateral interstitial and airspace opacities, likely multifocal pneumonia given the clinical  history. Electronically Signed   By: Feliberto Harts MD   On: 02/15/2020 10:56   DG Chest Port 1V same Day  Result Date: 02/24/2020 CLINICAL DATA:  COVID EXAM: PORTABLE CHEST 1 VIEW COMPARISON:  02/15/2020 FINDINGS: Bilateral patchy airspace opacities are again noted, similar prior study. Slight increase in lung volumes. Heart is normal size. No effusions or pneumothorax. No acute bony abnormality. IMPRESSION: Patchy bilateral airspace disease, not significantly changed since prior study. Slight increased lung volumes. Electronically Signed   By: Charlett Nose M.D.   On: 02/24/2020 08:31   VAS [place] LOWER EXTREMITY VENOUS (DVT)  Result Date: 02/16/2020  Lower Venous DVTStudy Indications: Swelling, and elevated d dimer.  Risk Factors: + covid. Performing Technologist: Jannet Askew RCT RDMS  Examination Guidelines: A complete evaluation includes B-mode imaging, spectral Doppler, color Doppler, and power Doppler as needed of all accessible portions of each vessel. Bilateral testing is considered an integral part of a complete examination. Limited examinations for reoccurring indications may be performed as noted. The reflux portion of the exam is performed with the patient in reverse Trendelenburg.  +---------+---------------+---------+-----------+----------+--------------+ RIGHT    CompressibilityPhasicitySpontaneityPropertiesThrombus Aging +---------+---------------+---------+-----------+----------+--------------+ CFV      Full           Yes      Yes                                 +---------+---------------+---------+-----------+----------+--------------+ SFJ      Full                                                        +---------+---------------+---------+-----------+----------+--------------+ FV Prox  Full                                                        +---------+---------------+---------+-----------+----------+--------------+  FV Mid   Full                                                         +---------+---------------+---------+-----------+----------+--------------+ FV DistalFull                                                        +---------+---------------+---------+-----------+----------+--------------+ PFV      Full                                                        +---------+---------------+---------+-----------+----------+--------------+ POP      Full           Yes      Yes                                 +---------+---------------+---------+-----------+----------+--------------+ PTV      Full                                                        +---------+---------------+---------+-----------+----------+--------------+ PERO     Full                                                        +---------+---------------+---------+-----------+----------+--------------+   +---------+---------------+---------+-----------+----------+--------------+ LEFT     CompressibilityPhasicitySpontaneityPropertiesThrombus Aging +---------+---------------+---------+-----------+----------+--------------+ CFV      Full           Yes      Yes                                 +---------+---------------+---------+-----------+----------+--------------+ SFJ      Full                                                        +---------+---------------+---------+-----------+----------+--------------+ FV Prox  Full                                                        +---------+---------------+---------+-----------+----------+--------------+ FV Mid   Full                                                        +---------+---------------+---------+-----------+----------+--------------+  FV DistalFull                                                        +---------+---------------+---------+-----------+----------+--------------+ PFV      Full                                                         +---------+---------------+---------+-----------+----------+--------------+ POP      Full           Yes      Yes                                 +---------+---------------+---------+-----------+----------+--------------+ PTV      Full                                                        +---------+---------------+---------+-----------+----------+--------------+ PERO     Full                                                        +---------+---------------+---------+-----------+----------+--------------+     Summary: RIGHT: - There is no evidence of deep vein thrombosis in the lower extremity.  - No cystic structure found in the popliteal fossa.  LEFT: - There is no evidence of deep vein thrombosis in the lower extremity.  - No cystic structure found in the popliteal fossa.  *See table(s) above for measurements and observations. Electronically signed by Coral ElseVance Brabham MD on 02/16/2020 at 9:33:24 PM.    Final    ECHOCARDIOGRAM LIMITED  Result Date: 02/16/2020    ECHOCARDIOGRAM LIMITED REPORT   Patient Name:   Ronaldo MiyamotoOMMY C Krus Date of Exam: 02/16/2020 Medical Rec #:  [id number]       Height:       72.0 in Accession #:    [id number]      Weight:       183.0 lb Date of Birth:  04/30/1935       BSA:          2.052 m Patient Age:    84 years        BP:           114/53 mmHg Patient Gender: M               HR:           77 bpm. Exam Location:  Inpatient Procedure: Limited Echo, Cardiac Doppler and Color Doppler Indications:    Elevated Troponin  History:        Patient has prior history of Echocardiogram examinations, most                 recent 02/17/2019. CHF and Cardiomyopathy, CAD, Arrythmias:Atrial  Fibrillation, Signs/Symptoms:Shortness of Breath and Fever; Risk                 Factors:Hypertension, Dyslipidemia, OBESITY and Diabetes.                 COVID+.  Sonographer:    Lavenia Atlas Referring Phys: [id number] Kasandra Knudsen PAHWANI IMPRESSIONS  1. Left ventricular ejection fraction,  by estimation, is 55 to 60%.  2. Right ventricular systolic function is normal. The right ventricular size is normal. There is normal pulmonary artery systolic pressure.  3. The interatrial septum is aneuyrsmal with frequent bowing from right to left.  4. The mitral valve is normal in structure. Trivial mitral valve regurgitation.  5. There is mild calcification of the aortic valve. There is mild thickening of the aortic valve. Mild aortic valve sclerosis is present, with no evidence of aortic valve stenosis. Comparison(s): Compared to prior echo on 01/2019, the LVEF appears mildly improved to 55-60%. FINDINGS  Left Ventricle: Septal motion is abnormal consistent with prior surgery. Left ventricular ejection fraction, by estimation, is 55 to 60%. The left ventricular internal cavity size was normal in size. There is no left ventricular hypertrophy. Right Ventricle: The right ventricular size is normal. Right ventricular systolic function is normal. There is normal pulmonary artery systolic pressure. The tricuspid regurgitant velocity is 2.41 m/s, and with an assumed right atrial pressure of 3 mmHg,  the estimated right ventricular systolic pressure is 26.2 mmHg. Left Atrium: Left atrial size was normal in size. Pericardium: There is no evidence of pericardial effusion. Mitral Valve: The mitral valve is normal in structure. There is mild thickening of the mitral valve leaflet(s). Mild mitral annular calcification. Trivial mitral valve regurgitation. Tricuspid Valve: The tricuspid valve is normal in structure. Tricuspid valve regurgitation is mild. Aortic Valve: There is mild calcification of the aortic valve. There is mild thickening of the aortic valve. Mild aortic valve sclerosis is present, with no evidence of aortic valve stenosis. Pulmonic Valve: The pulmonic valve was not assessed. Aorta: The aortic root is normal in size and structure. LEFT VENTRICLE PLAX 2D LVIDd:         3.80 cm  Diastology LVIDs:          2.70 cm  LV e' medial:    7.51 cm/s LV PW:         1.20 cm  LV E/e' medial:  7.7 LV IVS:        0.90 cm  LV e' lateral:   5.98 cm/s LVOT diam:     1.90 cm  LV E/e' lateral: 9.7 LVOT Area:     2.84 cm  RIGHT VENTRICLE RV S prime:     13.30 cm/s LEFT ATRIUM         Index LA diam:    2.80 cm 1.36 cm/m   AORTA Ao Root diam: 2.50 cm MITRAL VALVE               TRICUSPID VALVE MV Area (PHT): 5.38 cm    TR Peak grad:   23.2 mmHg MV Decel Time: 141 msec    TR Vmax:        241.00 cm/s MV E velocity: 57.80 cm/s MV A velocity: 70.30 cm/s  SHUNTS MV E/A ratio:  0.82        Systemic Diam: 1.90 cm Laurance Flatten MD Electronically signed by Laurance Flatten MD Signature Date/Time: 02/16/2020/4:23:30 PM    Final    ABORTED INVASIVE LAB PROCEDURE  Result Date:  02/15/2020 This case was aborted.  US Abdomen Limited RUQ  Result Date: 02/15/2020 CLINICAL DATA:  Elevated liver enzymes. EXAM: ULTRASOUND ABDOMEN LIMITED RIGHT UPPER QUADRANT COMPARISON:  CT abdomen 07/06/2019. FINDINGS: Gallbladder: Gallstones measuring up to 7 mm noted. Gallbladder wall thickness normal. No Murphy sign. Common bile duct: Diameter: Not visualized due to overlying bowel gas. Liver: Left no not visualized due to overlying bowel gas. No focal lesion identified. Within normal limits in parenchymal echogenicity. Portal vein is patent on color Doppler imaging with normal direction of blood flow towards the liver. Other: Limited study due to overlying bowel gas. IMPRESSION: 1.  Multiple small gallstones.  No gallbladder wall thickening. 2. Limited exam due to overlying bowel gas. Common bile duct not visualized. Portions of the liver not visualized. Electronically Signed   By: Maisie Fus  Register   On: 02/15/2020 15:35

## 2020-02-24 NOTE — Plan of Care (Signed)
°  Problem: Clinical Measurements: Goal: Will remain free from infection Outcome: Progressing Goal: Diagnostic test results will improve Outcome: Progressing Goal: Respiratory complications will improve Outcome: Progressing Goal: Cardiovascular complication will be avoided Outcome: Progressing   Problem: Activity: Goal: Risk for activity intolerance will decrease Outcome: Progressing   Problem: Elimination: Goal: Will not experience complications related to bowel motility Outcome: Progressing   Problem: Pain Managment: Goal: General experience of comfort will improve Outcome: Progressing

## 2020-02-25 DIAGNOSIS — K5900 Constipation, unspecified: Secondary | ICD-10-CM

## 2020-02-25 DIAGNOSIS — J9601 Acute respiratory failure with hypoxia: Secondary | ICD-10-CM | POA: Diagnosis not present

## 2020-02-25 DIAGNOSIS — U071 COVID-19: Secondary | ICD-10-CM | POA: Diagnosis not present

## 2020-02-25 DIAGNOSIS — J1282 Pneumonia due to coronavirus disease 2019: Secondary | ICD-10-CM

## 2020-02-25 DIAGNOSIS — E119 Type 2 diabetes mellitus without complications: Secondary | ICD-10-CM | POA: Diagnosis not present

## 2020-02-25 LAB — COMPREHENSIVE METABOLIC PANEL
ALT: 69 U/L — ABNORMAL HIGH (ref 0–44)
AST: 30 U/L (ref 15–41)
Albumin: 1.9 g/dL — ABNORMAL LOW (ref 3.5–5.0)
Alkaline Phosphatase: 74 U/L (ref 38–126)
Anion gap: 7 (ref 5–15)
BUN: 29 mg/dL — ABNORMAL HIGH (ref 8–23)
CO2: 29 mmol/L (ref 22–32)
Calcium: 7.9 mg/dL — ABNORMAL LOW (ref 8.9–10.3)
Chloride: 102 mmol/L (ref 98–111)
Creatinine, Ser: 0.81 mg/dL (ref 0.61–1.24)
GFR calc Af Amer: >60 mL/min (ref >60)
GFR calc non Af Amer: >60 mL/min (ref >60)
Glucose, Bld: 148 mg/dL — ABNORMAL HIGH (ref 70–99)
Potassium: 5 mmol/L (ref 3.5–5.1)
Sodium: 138 mmol/L (ref 135–145)
Total Bilirubin: 1.1 mg/dL (ref 0.3–1.2)
Total Protein: 4.8 g/dL — ABNORMAL LOW (ref 6.5–8.1)

## 2020-02-25 LAB — CBC
HCT: 40.4 % (ref 39.0–52.0)
Hemoglobin: 13.5 g/dL (ref 13.0–17.0)
MCH: 31.3 pg (ref 26.0–34.0)
MCHC: 33.4 g/dL (ref 30.0–36.0)
MCV: 93.7 fL (ref 80.0–100.0)
Platelets: 234 10*3/uL (ref 150–400)
RBC: 4.31 MIL/uL (ref 4.22–5.81)
RDW: 13.1 % (ref 11.5–15.5)
WBC: 10.6 10*3/uL — ABNORMAL HIGH (ref 4.0–10.5)
nRBC: 0 % (ref 0.0–0.2)

## 2020-02-25 LAB — D-DIMER, QUANTITATIVE: D-Dimer, Quant: 1.7 ug/mL-FEU — ABNORMAL HIGH (ref 0.00–0.50)

## 2020-02-25 LAB — C-REACTIVE PROTEIN: CRP: 0.7 mg/dL (ref <1.0)

## 2020-02-25 MED ORDER — MOMETASONE FURO-FORMOTEROL FUM 200-5 MCG/ACT IN AERO
2.0000 | INHALATION_SPRAY | Freq: Two times a day (BID) | RESPIRATORY_TRACT | Status: DC
  Administered 2020-02-26 – 2020-03-08 (×23): 2 via RESPIRATORY_TRACT
  Filled 2020-02-25: qty 8.8

## 2020-02-25 MED ORDER — FLEET ENEMA 7-19 GM/118ML RE ENEM: 1.0000 | ENEMA | Freq: Every day | RECTAL | Status: DC | PRN

## 2020-02-25 NOTE — Progress Notes (Signed)
Occupational Therapy Treatment Patient Details Name: Gordon Novak MRN: [id number] DOB: 07-Dec-1934 Today's Date: 02/25/2020    History of present illness Pt is an 85 y.o. male who tested (+) COVID-19 on 02/07/20, now admitted 02/15/20 with hypoxia; workup for acute hypoxemic respiratory failure due to COVID-19 PNA with severe lung parenchymal injury. PMH includes CAD, HTN, HLD, DM2, PE; pt fully vaccinated.   OT comments  Pt continues to present with decreased activity tolerance. Pt performing toileting with Min guard A for safety in standing. Pt performing short distance mobility (~6 feet) and then taking seated rest break with Min A; repeating four times. SpO2 dropping to 76%. Able to recover to >88% with seated rest break and purse lip breathing.  Pt highly motivated. Continue to recommend dc to home with HHOT and will continue to follow acutely as admitted.   Rest: SpO2 90s on 15L via HFNC, HR 90s, RR 20s.  Activity: SpO2 88-76% on 15L via HFNC, HR 112, RR 20s.    Follow Up Recommendations  Home health OT;Supervision/Assistance - 24 hour    Equipment Recommendations  3 in 1 bedside commode    Recommendations for Other Services      Precautions / Restrictions Precautions Precautions: Fall;Other (comment) Precaution Comments: Quick to desaturate - chronic LLE swelling (negative DVT)       Mobility Bed Mobility               General bed mobility comments: Received sitting in recliner  Transfers Overall transfer level: Needs assistance Equipment used: Rolling walker (2 wheeled) Transfers: Sit to/from Stand Sit to Stand: Min guard         General transfer comment: Min guard A for safety    Balance Overall balance assessment: Needs assistance Sitting-balance support: No upper extremity supported;Feet supported Sitting balance-Leahy Scale: Fair     Standing balance support: During functional activity;No upper extremity supported;Single extremity  supported Standing balance-Leahy Scale: Poor Standing balance comment: Reliant on atleast one UE support                           ADL either performed or assessed with clinical judgement   ADL Overall ADL's : Needs assistance/impaired                         Toilet Transfer: Min Barrister's clerk Details (indicate cue type and reason): Min Guard A for safety         Functional mobility during ADLs: Minimal assistance General ADL Comments: Pt performing toileting and then short distance mobility. Walking ~6 feet and then takign seated rest break; x4.      Vision       Perception     Praxis      Cognition Arousal/Alertness: Awake/alert Behavior During Therapy: WFL for tasks assessed/performed Overall Cognitive Status: Within Functional Limits for tasks assessed                                 General Comments: WFL for simple tasks, following all commands. Having to repeat at times due to [facility]        Exercises     Shoulder Instructions       General Comments Rest: SpO2 90s on 15L, HR 90s, RR 20s, SpO2 dropping to 76% with mobility. Increased seated rest break to recover back to >88%  Pertinent Vitals/ Pain       Pain Assessment: No/denies pain  Home Living                                          Prior Functioning/Environment              Frequency  Min 2X/week        Progress Toward Goals  OT Goals(current goals can now be found in the care plan section)  Progress towards OT goals: Progressing toward goals  Acute Rehab OT Goals Patient Stated Goal: "I want to get back home as soon as I can... I hope our traveling days have not come to an end" OT Goal Formulation: With patient Time For Goal Achievement: 03/08/20 Potential to Achieve Goals: Good ADL Goals Pt Will Perform Lower Body Bathing: with modified independence;sitting/lateral leans;sit to/from stand Pt Will  Perform Lower Body Dressing: with modified independence;sitting/lateral leans;sit to/from stand Pt Will Transfer to Toilet: with modified independence;ambulating;regular height toilet Pt Will Perform Tub/Shower Transfer: Shower transfer;shower seat;ambulating;with modified independence Pt/caregiver will Perform Home Exercise Program: Increased strength;Both right and left upper extremity;With written HEP provided Additional ADL Goal #1: Pt will recall and apply 3-5 energy conservation strategies to apply to ADL routine Additional ADL Goal #2: Pt will self monitor and maintain SpO2 >88% during ADL mobility  Plan Discharge plan remains appropriate    Co-evaluation                 AM-PAC OT "6 Clicks" Daily Activity     Outcome Measure   Help from another person eating meals?: A Little Help from another person taking care of personal grooming?: A Little Help from another person toileting, which includes using toliet, bedpan, or urinal?: A Little Help from another person bathing (including washing, rinsing, drying)?: A Little Help from another person to put on and taking off regular upper body clothing?: A Little Help from another person to put on and taking off regular lower body clothing?: A Little 6 Click Score: 18    End of Session Equipment Utilized During Treatment: Oxygen (15L via HFNC)  OT Visit Diagnosis: Unsteadiness on feet (R26.81);Other abnormalities of gait and mobility (R26.89);Muscle weakness (generalized) (M62.81)   Activity Tolerance Patient tolerated treatment well   Patient Left in chair;with call bell/phone within reach   Nurse Communication Mobility status        Time: 1540-1611 OT Time Calculation (min): 31 min  Charges: OT General Charges $OT Visit: 1 Visit OT Treatments $Self Care/Home Management : 8-22 mins $Therapeutic Activity: 8-22 mins  Ellieanna Funderburg MSOT, OTR/L Acute Rehab Pager: [phone] Office: [phone]   Theodoro Grist  Cole Eastridge 02/25/2020, 5:47 PM

## 2020-02-25 NOTE — Progress Notes (Signed)
PROGRESS NOTE                                                                                                                                                                                                             Patient Demographics:    Gordon Novak, is a 85 y.o. male, DOB - 09/04/1934, ZOX:[id number]  Outpatient Primary MD for the patient is [name], MD   Admit date - 02/15/2020   LOS - 10   Brief Narrative: Patient is a 85 y.o. male with PMHx of CAD s/p PCI 2020, DM-2, HLD, HTN, history of PE no longer on anticoagulation-presented with shortness of breath-found to have severe hypoxemia requiring high flow oxygen-due to COVID-19 pneumonia.  COVID-19 vaccinated status: Vaccinated  Significant Events: 9/21>> Admit to [facility] for hypoxia due to COVID-19 pneumonia  Significant studies: 9/21>>Chest x-ray: Diffuse lung infiltrates 9/22>> CTA chest: No PE-severe bilateral patchy/groundglass appearing infiltrates, cholelithiasis 9/22>> bilateral lower extremity Doppler: No DVT 9/22>> Echo: EF 55-60%, RV systolic function normal. 9/30>> chest x-ray: Patchy bilateral airspace disease-not significantly changed from prior.  COVID-19 medications: Steroids: 9/21>> Remdesivir: 9/21>> 9/25 Baricitinib: 9/21>>  Antibiotics: Rocephin: 9/22>> 9/26 Zithromax: 9/22>> 9/26  Microbiology data: 9/21 >>blood culture: No growth  Procedures: None  Consults: Cardiology  DVT prophylaxis: enoxaparin (LOVENOX) injection 40 mg at twice daily dosing (intermediate dosing) SCDs Start: 02/15/20 1300    Subjective:   Patient was weaned down to 20 L this morning.  Nurses noted desaturation into the early eighties with any form of minimal exertion such as talking or eating.  Patient denies any chest pain or shortness of breath more than usual.  Denies any other discomfort.  The only complaint he has is that he has not had a BM  in several days.   Assessment  & Plan :   Acute Hypoxic Resp Failure due to Covid 19 Viral pneumonia Patient has completed course of Remdesivir.  Remains on steroids and baricitinib.  Has chronic left lower extremity edema.  Continue oral Lasix that he is getting daily.  Keep him in negative fluid balance. Patient remains on high flow nasal cannula at 20 to 25 L/min.  Saturations are in the mid eighties. Continue incentive spirometry mobilization as much as possible. D-dimer noted to be 1.7 today.  CRP 0.7.   Elevated troponins Minimally elevated troponins  with flat trend-not consistent with ACS-echo with preserved EF.  Cardiology evaluation completed-no recommendations apart from supportive care.    Minimally elevated D-dimer Has been trending downwards.  Has a history of PE in 2020 and completed 1 year treatment with Xarelto in May 2021.  CT angiogram chest and Doppler studies negative for VTE.  Continue intermediate dosing with Lovenox.    Transaminitis Likely secondary to COVID-19.  Seems to be improving.    Chronic systolic heart failure During this hospitalization shows normal systolic function.  Continue oral Lasix.  Keep in negative fluid balance.   History of CAD Stable.  Seen by cardiology initially due to elevated troponin.  Currently without any chest pain.  Continue medical management.  On aspirin beta-blocker and statin.    DM-2 (A1c 7.1 on 9/21) Noted to have hypoglycemia yesterday and 1 dose of Levemir was decreased.  Continue to monitor.  SSI.    Gout Continue allopurinol  BPH Continue Flomax  Chronic left lower extremity swelling At baseline-lower extremity Dopplers negative.  Constipation Patient on MiraLAX twice a day.  Will add suppository/enema as needed.  DVT prophylaxis: Lovenox Family Communication  :  Daughter [name]([name] [phone]).  Updated today. Code Status : Discussed with patient and his daughter today.  Patient does not want any heroic measures  if he were to get worse.  Will change to DNR.  Confirmed with daughter as well.     Diet :  Diet Order            Diet heart healthy/carb modified Room service appropriate? No; Fluid consistency: Thin  Diet effective now                  Disposition Plan  : Home health recommended by PT. Status is: Inpatient  Remains inpatient appropriate because:Inpatient level of care appropriate due to severity of illness   Dispo: The patient is from: Home              Anticipated d/c is to: Home              Anticipated d/c date is: > 3 days              Patient currently is not medically stable to d/c.  Barriers to discharge: Severe hypoxemia requiring heated high flow  Antimicorbials  :    Anti-infectives (From admission, onward)   Start     Dose/Rate Route Frequency Ordered Stop   02/16/20 1330  azithromycin (ZITHROMAX) 500 mg in sodium chloride 0.9 % 250 mL IVPB        500 mg 250 mL/hr over 60 Minutes Intravenous Every 24 hours 02/16/20 1237 02/20/20 1320   02/16/20 1300  cefTRIAXone (ROCEPHIN) 2 g in sodium chloride 0.9 % 100 mL IVPB        2 g 200 mL/hr over 30 Minutes Intravenous Every 24 hours 02/16/20 1237 02/20/20 1252   02/16/20 1000  remdesivir 100 mg in sodium chloride 0.9 % 100 mL IVPB       "Followed by" Linked Group Details   100 mg 200 mL/hr over 30 Minutes Intravenous Daily 02/15/20 1301 02/19/20 0855   02/15/20 1400  remdesivir 200 mg in sodium chloride 0.9% 250 mL IVPB       "Followed by" Linked Group Details   200 mg 580 mL/hr over 30 Minutes Intravenous Once 02/15/20 1301 02/15/20 1504      Inpatient Medications  Scheduled Meds: . allopurinol  300 mg Oral Daily  .  vitamin C  500 mg Oral Daily  . aspirin EC  81 mg Oral Daily  . baricitinib  4 mg Oral Daily  . enoxaparin (LOVENOX) injection  40 mg Subcutaneous BID  . furosemide  40 mg Oral Daily  . insulin aspart  0-15 Units Subcutaneous TID WC  . insulin aspart  0-5 Units Subcutaneous QHS  . insulin  detemir  10 Units Subcutaneous Daily  . linagliptin  5 mg Oral Daily  . methylPREDNISolone (SOLU-MEDROL) injection  60 mg Intravenous Q12H  . metoprolol succinate  12.5 mg Oral QHS  . pantoprazole  40 mg Oral Daily  . polyethylene glycol  17 g Oral BID  . prednisoLONE acetate  1 drop Both Eyes QHS  . rosuvastatin  10 mg Oral Daily  . senna-docusate  2 tablet Oral QHS  . tamsulosin  0.4 mg Oral QHS  . zinc sulfate  220 mg Oral Daily   Continuous Infusions: . sodium chloride 10 mL/hr at 02/17/20 0500   PRN Meds:.acetaminophen, bisacodyl, chlorpheniramine-HYDROcodone, guaiFENesin-dextromethorphan, ondansetron **OR** ondansetron (ZOFRAN) IV   See all Orders from today for further details   Osvaldo Shipper M.D on 02/25/2020 at 11:39 AM  To page go to [email or web address] - use universal password  [facility] -  Office  [phone]    Objective:   Vitals:   02/25/20 0300 02/25/20 0600 02/25/20 0723 02/25/20 0844  BP: 120/67  (!) 103/53   Pulse: 77 68 66   Resp: 18 15 15    Temp: 98.6 F (37 C)  98.1 F (36.7 C)   TempSrc: Oral  Oral   SpO2: 93% 96% 94% 90%  Weight: 83.4 kg     Height:        Wt Readings from Last 3 Encounters:  02/25/20 83.4 kg  02/09/20 83.9 kg  08/30/19 83 kg     Intake/Output Summary (Last 24 hours) at 02/25/2020 1139 Last data filed at 02/25/2020 0933 Gross per 24 hour  Intake 480 ml  Output 1125 ml  Net -645 ml     Physical Exam General appearance: Awake alert.  In no distress Resp: Tachypneic without use of accessory muscles.  Coarse breath sounds with crackles at the bases.  No wheezing or rhonchi. Cardio: S1-S2 is normal regular.  No S3-S4.  No rubs murmurs or bruit GI: Abdomen is soft.  Nontender nondistended.  Bowel sounds are present normal.  No masses organomegaly Extremities: Chronic edema bilateral lower extremities left more than right.  This is chronic per patient. Neurologic: Alert and oriented x3.  No focal neurological  deficits.     Data Review:    CBC Recent Labs  Lab 02/19/20 0226 02/19/20 0226 02/20/20 5784 02/20/20 0338 02/21/20 0458 02/22/20 0516 02/23/20 0946 02/24/20 1256 02/25/20 0803  WBC 8.1   < > 8.9   < > 11.4* 13.1* 19.8* 15.3* 10.6*  HGB 13.2   < > 13.1   < > 13.0 13.1 15.3 15.1 13.5  HCT 39.5   < > 38.7*   < > 38.8* 39.8 46.6 45.1 40.4  PLT 250   < > 252   < > 237 239 317 296 234  MCV 92.7   < > 93.3   < > 93.3 93.6 93.6 94.0 93.7  MCH 31.0   < > 31.6   < > 31.3 30.8 30.7 31.5 31.3  MCHC 33.4   < > 33.9   < > 33.5 32.9 32.8 33.5 33.4  RDW 13.3   < > 13.4   < >  13.2 13.2 13.2 13.1 13.1  LYMPHSABS 0.3*  --  0.2*  --   --   --   --   --   --   MONOABS 0.2  --  0.2  --   --   --   --   --   --   EOSABS 0.0  --  0.0  --   --   --   --   --   --   BASOSABS 0.0  --  0.0  --   --   --   --   --   --    < > = values in this interval not displayed.    Chemistries  Recent Labs  Lab 02/19/20 0226 02/19/20 0226 02/20/20 0338 02/22/20 0516 02/23/20 0946 02/24/20 1256 02/25/20 0803  NA 139   < > 139 140 137 136 138  K 4.5   < > 4.5 4.7 4.3 4.5 5.0  CL 108   < > 107 106 104 99 102  CO2 23   < > 23 28 23 27 29   GLUCOSE 191*   < > 211* 143* 124* 216* 148*  BUN 36*   < > 35* 36* 36* 29* 29*  CREATININE 0.95   < > 0.92 0.92 0.95 0.93 0.81  CALCIUM 7.7*   < > 7.8* 7.9* 8.1* 8.0* 7.9*  MG 2.5*  --  2.7*  --   --   --   --   AST 73*   < > 58* 30 54* 53* 30  ALT 117*   < > 109* 74* 83* 94* 69*  ALKPHOS 101   < > 93 83 95 95 74  BILITOT 0.5   < > 0.8 1.0 1.2 0.9 1.1   < > = values in this interval not displayed.     Recent Labs    02/24/20 1256 02/25/20 0803  DDIMER 2.26* 1.70*        Component Value Date/Time   BNP 199.1 (H) 02/15/2020 1040    Micro Results Recent Results (from the past 240 hour(s))  Culture, blood (Routine X 2) w Reflex to ID Panel     Status: None   Collection Time: 02/15/20  1:24 PM   Specimen: BLOOD  Result Value Ref Range Status   Specimen  Description BLOOD RIGHT ANTECUBITAL  Final   Special Requests   Final    BOTTLES DRAWN AEROBIC AND ANAEROBIC Blood Culture adequate volume   Culture   Final    NO GROWTH 5 DAYS Performed at [facility], [address], [place], [place]    Report Status 02/20/2020 FINAL  Final  Culture, blood (Routine X 2) w Reflex to ID Panel     Status: None   Collection Time: 02/15/20  1:24 PM   Specimen: BLOOD  Result Value Ref Range Status   Specimen Description BLOOD BLOOD LEFT HAND  Final   Special Requests   Final    BOTTLES DRAWN AEROBIC AND ANAEROBIC Blood Culture adequate volume   Culture   Final    NO GROWTH 5 DAYS Performed at [facility], [address], [place], [place]    Report Status 02/20/2020 FINAL  Final    Radiology Reports CT Head Wo Contrast  Result Date: 02/09/2020 CLINICAL DATA:  Trauma, fall on blood thinners EXAM: CT HEAD WITHOUT CONTRAST CT CERVICAL SPINE WITHOUT CONTRAST TECHNIQUE: Multidetector CT imaging of the head and cervical spine was performed  following the standard protocol without intravenous contrast. Multiplanar CT image reconstructions of the cervical spine were also generated. COMPARISON:  None. FINDINGS: CT HEAD FINDINGS Brain: No acute territorial infarction, hemorrhage, or intracranial mass. Moderate atrophy. Mild hypodensity in the white matter consistent with chronic small vessel ischemic change. The ventricles are nonenlarged. Vascular: No hyperdense vessels.  Carotid vascular calcification. Skull: No depressed skull fracture. Sinuses/Orbits: Mucosal thickening in the maxillary and ethmoid sinuses. Small fluid level in the left sphenoid sinus but no central skull base lucency is seen. Possible chronic left nasal bone fracture. Other: Mild right posterior scalp swelling. CT CERVICAL SPINE FINDINGS Alignment: No subluxation.  Facet alignment within normal limits. Skull base and vertebrae: No acute fracture. No primary bone lesion  or focal pathologic process. Soft tissues and spinal canal: No prevertebral fluid or swelling. No visible canal hematoma. Disc levels: Multiple level degenerative change with moderate severe disease at C5-C6 and C6-C7. Facet degenerative changes at multiple levels with foraminal narrowing. Upper chest: Apical scarring Other: None IMPRESSION: 1. No CT evidence for acute intracranial abnormality. Atrophy and mild chronic small vessel ischemic changes of the white matter. 2. Degenerative changes of the cervical spine. No acute osseous abnormality. Electronically Signed   By: Jasmine Pang M.D.   On: 02/09/2020 01:13   CT ANGIO CHEST PE W OR WO CONTRAST  Result Date: 02/16/2020 CLINICAL DATA:  COVID positive peer EXAM: CT ANGIOGRAPHY CHEST WITH CONTRAST TECHNIQUE: Multidetector CT imaging of the chest was performed using the standard protocol during bolus administration of intravenous contrast. Multiplanar CT image reconstructions and MIPs were obtained to evaluate the vascular anatomy. CONTRAST:  OMNIPAQUE IOHEXOL 350 MG/ML SOLN COMPARISON:  Oct 22, 2018 FINDINGS: Cardiovascular: There is moderate severity calcification of the thoracic aorta. Satisfactory opacification of the pulmonary arteries to the segmental level. No evidence of pulmonary embolism. Normal heart size. No pericardial effusion. Mediastinum/Nodes: No enlarged mediastinal, hilar, or axillary lymph nodes. Thyroid gland, trachea, and esophagus demonstrate no significant findings. Lungs/Pleura: Marked severity patchy and ground-glass appearing infiltrates are seen throughout both lungs. There is no evidence of a pleural effusion or pneumothorax. Upper Abdomen: Numerous subcentimeter gallstones are seen within the lumen of an otherwise normal-appearing gallbladder. Musculoskeletal: No chest wall abnormality. No acute or significant osseous findings. Review of the MIP images confirms the above findings. IMPRESSION: 1. Marked severity bilateral  patchy and ground-glass appearing infiltrates. 2. Cholelithiasis. 3. Aortic atherosclerosis. Aortic Atherosclerosis (ICD10-I70.0). Electronically Signed   By: Aram Candela M.D.   On: 02/16/2020 22:13   CT Cervical Spine Wo Contrast  Result Date: 02/09/2020 CLINICAL DATA:  Trauma, fall on blood thinners EXAM: CT HEAD WITHOUT CONTRAST CT CERVICAL SPINE WITHOUT CONTRAST TECHNIQUE: Multidetector CT imaging of the head and cervical spine was performed following the standard protocol without intravenous contrast. Multiplanar CT image reconstructions of the cervical spine were also generated. COMPARISON:  None. FINDINGS: CT HEAD FINDINGS Brain: No acute territorial infarction, hemorrhage, or intracranial mass. Moderate atrophy. Mild hypodensity in the white matter consistent with chronic small vessel ischemic change. The ventricles are nonenlarged. Vascular: No hyperdense vessels.  Carotid vascular calcification. Skull: No depressed skull fracture. Sinuses/Orbits: Mucosal thickening in the maxillary and ethmoid sinuses. Small fluid level in the left sphenoid sinus but no central skull base lucency is seen. Possible chronic left nasal bone fracture. Other: Mild right posterior scalp swelling. CT CERVICAL SPINE FINDINGS Alignment: No subluxation.  Facet alignment within normal limits. Skull base and vertebrae: No acute fracture. No primary bone  lesion or focal pathologic process. Soft tissues and spinal canal: No prevertebral fluid or swelling. No visible canal hematoma. Disc levels: Multiple level degenerative change with moderate severe disease at C5-C6 and C6-C7. Facet degenerative changes at multiple levels with foraminal narrowing. Upper chest: Apical scarring Other: None IMPRESSION: 1. No CT evidence for acute intracranial abnormality. Atrophy and mild chronic small vessel ischemic changes of the white matter. 2. Degenerative changes of the cervical spine. No acute osseous abnormality. Electronically Signed    By: Jasmine Pang M.D.   On: 02/09/2020 01:13   DG Chest Port 1 View  Result Date: 02/15/2020 CLINICAL DATA:  COVID positive.  Hypoxia. EXAM: PORTABLE CHEST 1 VIEW COMPARISON:  Oct 22, 2018 FINDINGS: Diffuse bilateral interstitial and airspace opacities, new from prior. No visible pleural effusion. No pneumothorax. Cardiac silhouette is accentuated by low lung volumes and portable technique. Aortic atherosclerosis. IMPRESSION: Diffuse bilateral interstitial and airspace opacities, likely multifocal pneumonia given the clinical history. Electronically Signed   By: Feliberto Harts MD   On: 02/15/2020 10:56   DG Chest Port 1V same Day  Result Date: 02/24/2020 CLINICAL DATA:  COVID EXAM: PORTABLE CHEST 1 VIEW COMPARISON:  02/15/2020 FINDINGS: Bilateral patchy airspace opacities are again noted, similar prior study. Slight increase in lung volumes. Heart is normal size. No effusions or pneumothorax. No acute bony abnormality. IMPRESSION: Patchy bilateral airspace disease, not significantly changed since prior study. Slight increased lung volumes. Electronically Signed   By: Charlett Nose M.D.   On: 02/24/2020 08:31   VAS [place] LOWER EXTREMITY VENOUS (DVT)  Result Date: 02/16/2020  Lower Venous DVTStudy Indications: Swelling, and elevated d dimer.  Risk Factors: + covid. Performing Technologist: Jannet Askew RCT RDMS  Examination Guidelines: A complete evaluation includes B-mode imaging, spectral Doppler, color Doppler, and power Doppler as needed of all accessible portions of each vessel. Bilateral testing is considered an integral part of a complete examination. Limited examinations for reoccurring indications may be performed as noted. The reflux portion of the exam is performed with the patient in reverse Trendelenburg.  +---------+---------------+---------+-----------+----------+--------------+ RIGHT    CompressibilityPhasicitySpontaneityPropertiesThrombus Aging  +---------+---------------+---------+-----------+----------+--------------+ CFV      Full           Yes      Yes                                 +---------+---------------+---------+-----------+----------+--------------+ SFJ      Full                                                        +---------+---------------+---------+-----------+----------+--------------+ FV Prox  Full                                                        +---------+---------------+---------+-----------+----------+--------------+ FV Mid   Full                                                        +---------+---------------+---------+-----------+----------+--------------+  FV DistalFull                                                        +---------+---------------+---------+-----------+----------+--------------+ PFV      Full                                                        +---------+---------------+---------+-----------+----------+--------------+ POP      Full           Yes      Yes                                 +---------+---------------+---------+-----------+----------+--------------+ PTV      Full                                                        +---------+---------------+---------+-----------+----------+--------------+ PERO     Full                                                        +---------+---------------+---------+-----------+----------+--------------+   +---------+---------------+---------+-----------+----------+--------------+ LEFT     CompressibilityPhasicitySpontaneityPropertiesThrombus Aging +---------+---------------+---------+-----------+----------+--------------+ CFV      Full           Yes      Yes                                 +---------+---------------+---------+-----------+----------+--------------+ SFJ      Full                                                         +---------+---------------+---------+-----------+----------+--------------+ FV Prox  Full                                                        +---------+---------------+---------+-----------+----------+--------------+ FV Mid   Full                                                        +---------+---------------+---------+-----------+----------+--------------+ FV DistalFull                                                        +---------+---------------+---------+-----------+----------+--------------+  PFV      Full                                                        +---------+---------------+---------+-----------+----------+--------------+ POP      Full           Yes      Yes                                 +---------+---------------+---------+-----------+----------+--------------+ PTV      Full                                                        +---------+---------------+---------+-----------+----------+--------------+ PERO     Full                                                        +---------+---------------+---------+-----------+----------+--------------+     Summary: RIGHT: - There is no evidence of deep vein thrombosis in the lower extremity.  - No cystic structure found in the popliteal fossa.  LEFT: - There is no evidence of deep vein thrombosis in the lower extremity.  - No cystic structure found in the popliteal fossa.  *See table(s) above for measurements and observations. Electronically signed by Coral Else MD on 02/16/2020 at 9:33:24 PM.    Final    ECHOCARDIOGRAM LIMITED  Result Date: 02/16/2020    ECHOCARDIOGRAM LIMITED REPORT   Patient Name:   Gordon Novak Date of Exam: 02/16/2020 Medical Rec #:  [id number]       Height:       72.0 in Accession #:    [id number]      Weight:       183.0 lb Date of Birth:  31-Aug-1934       BSA:          2.052 m Patient Age:    84 years        BP:           114/53 mmHg Patient Gender: M                HR:           77 bpm. Exam Location:  Inpatient Procedure: Limited Echo, Cardiac Doppler and Color Doppler Indications:    Elevated Troponin  History:        Patient has prior history of Echocardiogram examinations, most                 recent 02/17/2019. CHF and Cardiomyopathy, CAD, Arrythmias:Atrial                 Fibrillation, Signs/Symptoms:Shortness of Breath and Fever; Risk                 Factors:Hypertension, Dyslipidemia, OBESITY and Diabetes.                 COVID+.  Sonographer:    Lavenia Atlas Referring Phys: [phone]  RINKA R PAHWANI IMPRESSIONS  1. Left ventricular ejection fraction, by estimation, is 55 to 60%.  2. Right ventricular systolic function is normal. The right ventricular size is normal. There is normal pulmonary artery systolic pressure.  3. The interatrial septum is aneuyrsmal with frequent bowing from right to left.  4. The mitral valve is normal in structure. Trivial mitral valve regurgitation.  5. There is mild calcification of the aortic valve. There is mild thickening of the aortic valve. Mild aortic valve sclerosis is present, with no evidence of aortic valve stenosis. Comparison(s): Compared to prior echo on 01/2019, the LVEF appears mildly improved to 55-60%. FINDINGS  Left Ventricle: Septal motion is abnormal consistent with prior surgery. Left ventricular ejection fraction, by estimation, is 55 to 60%. The left ventricular internal cavity size was normal in size. There is no left ventricular hypertrophy. Right Ventricle: The right ventricular size is normal. Right ventricular systolic function is normal. There is normal pulmonary artery systolic pressure. The tricuspid regurgitant velocity is 2.41 m/s, and with an assumed right atrial pressure of 3 mmHg,  the estimated right ventricular systolic pressure is 26.2 mmHg. Left Atrium: Left atrial size was normal in size. Pericardium: There is no evidence of pericardial effusion. Mitral Valve: The mitral valve is normal in  structure. There is mild thickening of the mitral valve leaflet(s). Mild mitral annular calcification. Trivial mitral valve regurgitation. Tricuspid Valve: The tricuspid valve is normal in structure. Tricuspid valve regurgitation is mild. Aortic Valve: There is mild calcification of the aortic valve. There is mild thickening of the aortic valve. Mild aortic valve sclerosis is present, with no evidence of aortic valve stenosis. Pulmonic Valve: The pulmonic valve was not assessed. Aorta: The aortic root is normal in size and structure. LEFT VENTRICLE PLAX 2D LVIDd:         3.80 cm  Diastology LVIDs:         2.70 cm  LV e' medial:    7.51 cm/s LV PW:         1.20 cm  LV E/e' medial:  7.7 LV IVS:        0.90 cm  LV e' lateral:   5.98 cm/s LVOT diam:     1.90 cm  LV E/e' lateral: 9.7 LVOT Area:     2.84 cm  RIGHT VENTRICLE RV S prime:     13.30 cm/s LEFT ATRIUM         Index LA diam:    2.80 cm 1.36 cm/m   AORTA Ao Root diam: 2.50 cm MITRAL VALVE               TRICUSPID VALVE MV Area (PHT): 5.38 cm    TR Peak grad:   23.2 mmHg MV Decel Time: 141 msec    TR Vmax:        241.00 cm/s MV E velocity: 57.80 cm/s MV A velocity: 70.30 cm/s  SHUNTS MV E/A ratio:  0.82        Systemic Diam: 1.90 cm Laurance Flatten MD Electronically signed by Laurance Flatten MD Signature Date/Time: 02/16/2020/4:23:30 PM    Final    ABORTED INVASIVE LAB PROCEDURE  Result Date: 02/15/2020 This case was aborted.  US Abdomen Limited RUQ  Result Date: 02/15/2020 CLINICAL DATA:  Elevated liver enzymes. EXAM: ULTRASOUND ABDOMEN LIMITED RIGHT UPPER QUADRANT COMPARISON:  CT abdomen 07/06/2019. FINDINGS: Gallbladder: Gallstones measuring up to 7 mm noted. Gallbladder wall thickness normal. No Murphy sign. Common bile duct: Diameter: Not visualized due to  overlying bowel gas. Liver: Left no not visualized due to overlying bowel gas. No focal lesion identified. Within normal limits in parenchymal echogenicity. Portal vein is patent on color  Doppler imaging with normal direction of blood flow towards the liver. Other: Limited study due to overlying bowel gas. IMPRESSION: 1.  Multiple small gallstones.  No gallbladder wall thickening. 2. Limited exam due to overlying bowel gas. Common bile duct not visualized. Portions of the liver not visualized. Electronically Signed   By: Maisie Fus  Register   On: 02/15/2020 15:35

## 2020-02-26 DIAGNOSIS — E119 Type 2 diabetes mellitus without complications: Secondary | ICD-10-CM | POA: Diagnosis not present

## 2020-02-26 DIAGNOSIS — J9601 Acute respiratory failure with hypoxia: Secondary | ICD-10-CM | POA: Diagnosis not present

## 2020-02-26 DIAGNOSIS — J1282 Pneumonia due to Coronavirus disease 2019: Secondary | ICD-10-CM | POA: Diagnosis not present

## 2020-02-26 DIAGNOSIS — U071 COVID-19: Secondary | ICD-10-CM | POA: Diagnosis not present

## 2020-02-26 LAB — CBC
HCT: 40.2 % (ref 39.0–52.0)
Hemoglobin: 13.3 g/dL (ref 13.0–17.0)
MCH: 30.7 pg (ref 26.0–34.0)
MCHC: 33.1 g/dL (ref 30.0–36.0)
MCV: 92.8 fL (ref 80.0–100.0)
Platelets: 218 10*3/uL (ref 150–400)
RBC: 4.33 MIL/uL (ref 4.22–5.81)
RDW: 12.8 % (ref 11.5–15.5)
WBC: 8.4 10*3/uL (ref 4.0–10.5)
nRBC: 0 % (ref 0.0–0.2)

## 2020-02-26 LAB — GLUCOSE, CAPILLARY
Glucose-Capillary: 98 mg/dL (ref 70–99)
Glucose-Capillary: 184 mg/dL — ABNORMAL HIGH (ref 70–99)
Glucose-Capillary: 150 mg/dL — ABNORMAL HIGH (ref 70–99)
Glucose-Capillary: 128 mg/dL — ABNORMAL HIGH (ref 70–99)

## 2020-02-26 LAB — COMPREHENSIVE METABOLIC PANEL
ALT: 72 U/L — ABNORMAL HIGH (ref 0–44)
AST: 34 U/L (ref 15–41)
Albumin: 1.9 g/dL — ABNORMAL LOW (ref 3.5–5.0)
Alkaline Phosphatase: 77 U/L (ref 38–126)
Anion gap: 7 (ref 5–15)
BUN: 32 mg/dL — ABNORMAL HIGH (ref 8–23)
CO2: 30 mmol/L (ref 22–32)
Calcium: 8 mg/dL — ABNORMAL LOW (ref 8.9–10.3)
Chloride: 99 mmol/L (ref 98–111)
Creatinine, Ser: 0.99 mg/dL (ref 0.61–1.24)
GFR calc Af Amer: >60 mL/min (ref >60)
GFR calc non Af Amer: >60 mL/min (ref >60)
Glucose, Bld: 112 mg/dL — ABNORMAL HIGH (ref 70–99)
Potassium: 5.1 mmol/L (ref 3.5–5.1)
Sodium: 136 mmol/L (ref 135–145)
Total Bilirubin: 1 mg/dL (ref 0.3–1.2)
Total Protein: 4.7 g/dL — ABNORMAL LOW (ref 6.5–8.1)

## 2020-02-26 NOTE — Progress Notes (Signed)
PROGRESS NOTE                                                                                                                                                                                                             Patient Demographics:    Gordon Novak, is a 85 y.o. male, DOB - 1935/01/18, PVV:[id number]  Outpatient Primary MD for the patient is [name], MD   Admit date - 02/15/2020   LOS - 11   Brief Narrative: Patient is a 85 y.o. male with PMHx of CAD s/p PCI 2020, DM-2, HLD, HTN, history of PE no longer on anticoagulation-presented with shortness of breath-found to have severe hypoxemia requiring high flow oxygen-due to COVID-19 pneumonia.  COVID-19 vaccinated status: Vaccinated  Significant Events: 9/21>> Admit to [facility] for hypoxia due to COVID-19 pneumonia  Significant studies: 9/21>>Chest x-ray: Diffuse lung infiltrates 9/22>> CTA chest: No PE-severe bilateral patchy/groundglass appearing infiltrates, cholelithiasis 9/22>> bilateral lower extremity Doppler: No DVT 9/22>> Echo: EF 55-60%, RV systolic function normal. 9/30>> chest x-ray: Patchy bilateral airspace disease-not significantly changed from prior.  COVID-19 medications: Steroids: 9/21>> Remdesivir: 9/21>> 9/25 Baricitinib: 9/21>>  Antibiotics: Rocephin: 9/22>> 9/26 Zithromax: 9/22>> 9/26  Microbiology data: 9/21 >>blood culture: No growth  Procedures: None  Consults: Cardiology  DVT prophylaxis: enoxaparin (LOVENOX) injection 40 mg at twice daily dosing (intermediate dosing) SCDs Start: 02/15/20 1300    Subjective:   Patient mentions that he seems to be doing better.  He is not as short of breath as he was yesterday.  Denies any chest pain.  No nausea vomiting.     Assessment  & Plan :   Acute Hypoxic Resp Failure due to Covid 19 Viral pneumonia Patient has completed course of Remdesivir.  Remains on steroids and  baricitinib.  Has chronic left lower extremity edema.  Continue oral Lasix that he is getting daily.  Keep him in negative fluid balance. Patient's oxygen requirement seems to be improving.  He is down to 10 L by high flow.  Saturations are in the late 80s to early 90s.  Patient's work of breathing appears to be better as well.  Continue incentive spirometry and mobilization.    Elevated troponins Minimally elevated troponins with flat trend-not consistent with ACS-echo with preserved EF.  Cardiology evaluation completed-no recommendations apart from supportive care.    Minimally  elevated D-dimer Has been trending downwards.  Has a history of PE in 2020 and completed 1 year treatment with Xarelto in May 2021.  CT angiogram chest and Doppler studies negative for VTE.  Continue intermediate dosing with Lovenox.    Transaminitis Likely secondary to COVID-19.  Seems to be improving.    Chronic systolic heart failure During this hospitalization shows normal systolic function.  Continue oral Lasix.  Keep in negative fluid balance.  Strict ins and outs and daily weights.   History of CAD Stable.  Seen by cardiology initially due to elevated troponin.  Currently without any chest pain.  Continue medical management.  On aspirin beta-blocker and statin.    DM-2 (A1c 7.1 on 9/21) Noted to have hypoglycemia on 9/30 and dose of Levemir was decreased..  SSI.    Gout Continue allopurinol  BPH Continue Flomax  Chronic left lower extremity swelling At baseline-lower extremity Dopplers negative.  Constipation Patient on MiraLAX twice a day.  Still has not had a bowel movement.  Will order Fleet enema.  DVT prophylaxis: Lovenox Family Communication  :  Daughter [name] [phone]  Updated yesterday.  Will do so again today. Code Status : DNR  Diet :  Diet Order            Diet heart healthy/carb modified Room service appropriate? No; Fluid consistency: Thin  Diet effective now                    Disposition Plan  : Home health recommended by PT. Status is: Inpatient  Remains inpatient appropriate because:Inpatient level of care appropriate due to severity of illness   Dispo: The patient is from: Home              Anticipated d/c is to: Home              Anticipated d/c date is: > 3 days              Patient currently is not medically stable to d/c.  Barriers to discharge: Severe hypoxemia requiring heated high flow  Antimicorbials  :    Anti-infectives (From admission, onward)   Start     Dose/Rate Route Frequency Ordered Stop   02/16/20 1330  azithromycin (ZITHROMAX) 500 mg in sodium chloride 0.9 % 250 mL IVPB        500 mg 250 mL/hr over 60 Minutes Intravenous Every 24 hours 02/16/20 1237 02/20/20 1320   02/16/20 1300  cefTRIAXone (ROCEPHIN) 2 g in sodium chloride 0.9 % 100 mL IVPB        2 g 200 mL/hr over 30 Minutes Intravenous Every 24 hours 02/16/20 1237 02/20/20 1252   02/16/20 1000  remdesivir 100 mg in sodium chloride 0.9 % 100 mL IVPB       "Followed by" Linked Group Details   100 mg 200 mL/hr over 30 Minutes Intravenous Daily 02/15/20 1301 02/19/20 0855   02/15/20 1400  remdesivir 200 mg in sodium chloride 0.9% 250 mL IVPB       "Followed by" Linked Group Details   200 mg 580 mL/hr over 30 Minutes Intravenous Once 02/15/20 1301 02/15/20 1504      Inpatient Medications  Scheduled Meds: . allopurinol  300 mg Oral Daily  . vitamin C  500 mg Oral Daily  . aspirin EC  81 mg Oral Daily  . baricitinib  4 mg Oral Daily  . enoxaparin (LOVENOX) injection  40 mg Subcutaneous BID  .  furosemide  40 mg Oral Daily  . insulin aspart  0-15 Units Subcutaneous TID WC  . insulin aspart  0-5 Units Subcutaneous QHS  . insulin detemir  10 Units Subcutaneous Daily  . linagliptin  5 mg Oral Daily  . methylPREDNISolone (SOLU-MEDROL) injection  60 mg Intravenous Q12H  . metoprolol succinate  12.5 mg Oral QHS  . mometasone-formoterol  2 puff Inhalation BID  .  pantoprazole  40 mg Oral Daily  . polyethylene glycol  17 g Oral BID  . prednisoLONE acetate  1 drop Both Eyes QHS  . rosuvastatin  10 mg Oral Daily  . senna-docusate  2 tablet Oral QHS  . tamsulosin  0.4 mg Oral QHS  . zinc sulfate  220 mg Oral Daily   Continuous Infusions: . sodium chloride 10 mL/hr at 02/17/20 0500   PRN Meds:.acetaminophen, bisacodyl, chlorpheniramine-HYDROcodone, guaiFENesin-dextromethorphan, ondansetron **OR** ondansetron (ZOFRAN) IV, sodium phosphate   See all Orders from today for further details   Osvaldo Shipper M.D on 02/26/2020 at 1:24 PM  To page go to [email or web address] - use universal password  [facility] -  Office  [phone]    Objective:   Vitals:   02/25/20 2332 02/26/20 0321 02/26/20 0747 02/26/20 1150  BP: 126/63 138/89 123/63 (!) 112/56  Pulse: 71 69 78 81  Resp: 16 17 18 20   Temp: 97.7 F (36.5 C) 97.6 F (36.4 C) 97.6 F (36.4 C) 97.9 F (36.6 C)  TempSrc: Oral Oral Oral Oral  SpO2: 100% 100% 95% 96%  Weight:      Height:        Wt Readings from Last 3 Encounters:  02/25/20 83.4 kg  02/09/20 83.9 kg  08/30/19 83 kg     Intake/Output Summary (Last 24 hours) at 02/26/2020 1324 Last data filed at 02/26/2020 0918 Gross per 24 hour  Intake 240 ml  Output 1225 ml  Net -985 ml     Physical Exam  General appearance: Awake alert.  In no distress Resp: Improved effort.  Coarse breath sounds with crackles at the bases.  No wheezing or rhonchi. Cardio: S1-S2 is normal regular.  No S3-S4.  No rubs murmurs or bruit GI: Abdomen is soft.  Nontender nondistended.  Bowel sounds are present normal.  No masses organomegaly Extremities: Chronic edema left lower extremity. Neurologic: No focal neurological deficits.      Data Review:    CBC Recent Labs  Lab 02/20/20 0338 02/21/20 0458 02/22/20 0516 02/23/20 0946 02/24/20 1256 02/25/20 0803 02/26/20 0500  WBC 8.9   < > 13.1* 19.8* 15.3* 10.6* 8.4  HGB 13.1   < >  13.1 15.3 15.1 13.5 13.3  HCT 38.7*   < > 39.8 46.6 45.1 40.4 40.2  PLT 252   < > 239 317 296 234 218  MCV 93.3   < > 93.6 93.6 94.0 93.7 92.8  MCH 31.6   < > 30.8 30.7 31.5 31.3 30.7  MCHC 33.9   < > 32.9 32.8 33.5 33.4 33.1  RDW 13.4   < > 13.2 13.2 13.1 13.1 12.8  LYMPHSABS 0.2*  --   --   --   --   --   --   MONOABS 0.2  --   --   --   --   --   --   EOSABS 0.0  --   --   --   --   --   --   BASOSABS 0.0  --   --   --   --   --   --    < > =  values in this interval not displayed.    Chemistries  Recent Labs  Lab 02/20/20 0338 02/20/20 0338 02/22/20 0516 02/23/20 0946 02/24/20 1256 02/25/20 0803 02/26/20 0500  NA 139   < > 140 137 136 138 136  K 4.5   < > 4.7 4.3 4.5 5.0 5.1  CL 107   < > 106 104 99 102 99  CO2 23   < > GLUCOSE 211*   < > 143* 124* 216* 148* 112*  BUN 35*   < > 36* 36* 29* 29* 32*  CREATININE 0.92   < > 0.92 0.95 0.93 0.81 0.99  CALCIUM 7.8*   < > 7.9* 8.1* 8.0* 7.9* 8.0*  MG 2.7*  --   --   --   --   --   --   AST 58*   < > 30 54* 53* 30 34  ALT 109*   < > 74* 83* 94* 69* 72*  ALKPHOS 93   < > 83 95 95 74 77  BILITOT 0.8   < > 1.0 1.2 0.9 1.1 1.0   < > = values in this interval not displayed.     Recent Labs    02/24/20 1256 02/25/20 0803  DDIMER 2.26* 1.70*        Component Value Date/Time   BNP 199.1 (H) 02/15/2020 1040    Micro Results No results found for this or any previous visit (from the past 240 hour(s)).  Radiology Reports CT Head Wo Contrast  Result Date: 02/09/2020 CLINICAL DATA:  Trauma, fall on blood thinners EXAM: CT HEAD WITHOUT CONTRAST CT CERVICAL SPINE WITHOUT CONTRAST TECHNIQUE: Multidetector CT imaging of the head and cervical spine was performed following the standard protocol without intravenous contrast. Multiplanar CT image reconstructions of the cervical spine were also generated. COMPARISON:  None. FINDINGS: CT HEAD FINDINGS Brain: No acute territorial infarction, hemorrhage, or intracranial  mass. Moderate atrophy. Mild hypodensity in the white matter consistent with chronic small vessel ischemic change. The ventricles are nonenlarged. Vascular: No hyperdense vessels.  Carotid vascular calcification. Skull: No depressed skull fracture. Sinuses/Orbits: Mucosal thickening in the maxillary and ethmoid sinuses. Small fluid level in the left sphenoid sinus but no central skull base lucency is seen. Possible chronic left nasal bone fracture. Other: Mild right posterior scalp swelling. CT CERVICAL SPINE FINDINGS Alignment: No subluxation.  Facet alignment within normal limits. Skull base and vertebrae: No acute fracture. No primary bone lesion or focal pathologic process. Soft tissues and spinal canal: No prevertebral fluid or swelling. No visible canal hematoma. Disc levels: Multiple level degenerative change with moderate severe disease at C5-C6 and C6-C7. Facet degenerative changes at multiple levels with foraminal narrowing. Upper chest: Apical scarring Other: None IMPRESSION: 1. No CT evidence for acute intracranial abnormality. Atrophy and mild chronic small vessel ischemic changes of the white matter. 2. Degenerative changes of the cervical spine. No acute osseous abnormality. Electronically Signed   By: Jasmine Pang M.D.   On: 02/09/2020 01:13   CT ANGIO CHEST PE W OR WO CONTRAST  Result Date: 02/16/2020 CLINICAL DATA:  COVID positive peer EXAM: CT ANGIOGRAPHY CHEST WITH CONTRAST TECHNIQUE: Multidetector CT imaging of the chest was performed using the standard protocol during bolus administration of intravenous contrast. Multiplanar CT image reconstructions and MIPs were obtained to evaluate the vascular anatomy. CONTRAST:  OMNIPAQUE IOHEXOL 350 MG/ML SOLN COMPARISON:  Oct 22, 2018 FINDINGS: Cardiovascular: There is moderate severity calcification of the  thoracic aorta. Satisfactory opacification of the pulmonary arteries to the segmental level. No evidence of pulmonary embolism. Normal  heart size. No pericardial effusion. Mediastinum/Nodes: No enlarged mediastinal, hilar, or axillary lymph nodes. Thyroid gland, trachea, and esophagus demonstrate no significant findings. Lungs/Pleura: Marked severity patchy and ground-glass appearing infiltrates are seen throughout both lungs. There is no evidence of a pleural effusion or pneumothorax. Upper Abdomen: Numerous subcentimeter gallstones are seen within the lumen of an otherwise normal-appearing gallbladder. Musculoskeletal: No chest wall abnormality. No acute or significant osseous findings. Review of the MIP images confirms the above findings. IMPRESSION: 1. Marked severity bilateral patchy and ground-glass appearing infiltrates. 2. Cholelithiasis. 3. Aortic atherosclerosis. Aortic Atherosclerosis (ICD10-I70.0). Electronically Signed   By: Aram Candela M.D.   On: 02/16/2020 22:13   CT Cervical Spine Wo Contrast  Result Date: 02/09/2020 CLINICAL DATA:  Trauma, fall on blood thinners EXAM: CT HEAD WITHOUT CONTRAST CT CERVICAL SPINE WITHOUT CONTRAST TECHNIQUE: Multidetector CT imaging of the head and cervical spine was performed following the standard protocol without intravenous contrast. Multiplanar CT image reconstructions of the cervical spine were also generated. COMPARISON:  None. FINDINGS: CT HEAD FINDINGS Brain: No acute territorial infarction, hemorrhage, or intracranial mass. Moderate atrophy. Mild hypodensity in the white matter consistent with chronic small vessel ischemic change. The ventricles are nonenlarged. Vascular: No hyperdense vessels.  Carotid vascular calcification. Skull: No depressed skull fracture. Sinuses/Orbits: Mucosal thickening in the maxillary and ethmoid sinuses. Small fluid level in the left sphenoid sinus but no central skull base lucency is seen. Possible chronic left nasal bone fracture. Other: Mild right posterior scalp swelling. CT CERVICAL SPINE FINDINGS Alignment: No subluxation.  Facet alignment within  normal limits. Skull base and vertebrae: No acute fracture. No primary bone lesion or focal pathologic process. Soft tissues and spinal canal: No prevertebral fluid or swelling. No visible canal hematoma. Disc levels: Multiple level degenerative change with moderate severe disease at C5-C6 and C6-C7. Facet degenerative changes at multiple levels with foraminal narrowing. Upper chest: Apical scarring Other: None IMPRESSION: 1. No CT evidence for acute intracranial abnormality. Atrophy and mild chronic small vessel ischemic changes of the white matter. 2. Degenerative changes of the cervical spine. No acute osseous abnormality. Electronically Signed   By: Jasmine Pang M.D.   On: 02/09/2020 01:13   DG Chest Port 1 View  Result Date: 02/15/2020 CLINICAL DATA:  COVID positive.  Hypoxia. EXAM: PORTABLE CHEST 1 VIEW COMPARISON:  Oct 22, 2018 FINDINGS: Diffuse bilateral interstitial and airspace opacities, new from prior. No visible pleural effusion. No pneumothorax. Cardiac silhouette is accentuated by low lung volumes and portable technique. Aortic atherosclerosis. IMPRESSION: Diffuse bilateral interstitial and airspace opacities, likely multifocal pneumonia given the clinical history. Electronically Signed   By: Feliberto Harts MD   On: 02/15/2020 10:56   DG Chest Port 1V same Day  Result Date: 02/24/2020 CLINICAL DATA:  COVID EXAM: PORTABLE CHEST 1 VIEW COMPARISON:  02/15/2020 FINDINGS: Bilateral patchy airspace opacities are again noted, similar prior study. Slight increase in lung volumes. Heart is normal size. No effusions or pneumothorax. No acute bony abnormality. IMPRESSION: Patchy bilateral airspace disease, not significantly changed since prior study. Slight increased lung volumes. Electronically Signed   By: Charlett Nose M.D.   On: 02/24/2020 08:31   VAS [place] LOWER EXTREMITY VENOUS (DVT)  Result Date: 02/16/2020  Lower Venous DVTStudy Indications: Swelling, and elevated d dimer.  Risk Factors: +  covid. Performing Technologist: Jannet Askew RCT RDMS  Examination Guidelines: A complete evaluation  includes B-mode imaging, spectral Doppler, color Doppler, and power Doppler as needed of all accessible portions of each vessel. Bilateral testing is considered an integral part of a complete examination. Limited examinations for reoccurring indications may be performed as noted. The reflux portion of the exam is performed with the patient in reverse Trendelenburg.  +---------+---------------+---------+-----------+----------+--------------+ RIGHT    CompressibilityPhasicitySpontaneityPropertiesThrombus Aging +---------+---------------+---------+-----------+----------+--------------+ CFV      Full           Yes      Yes                                 +---------+---------------+---------+-----------+----------+--------------+ SFJ      Full                                                        +---------+---------------+---------+-----------+----------+--------------+ FV Prox  Full                                                        +---------+---------------+---------+-----------+----------+--------------+ FV Mid   Full                                                        +---------+---------------+---------+-----------+----------+--------------+ FV DistalFull                                                        +---------+---------------+---------+-----------+----------+--------------+ PFV      Full                                                        +---------+---------------+---------+-----------+----------+--------------+ POP      Full           Yes      Yes                                 +---------+---------------+---------+-----------+----------+--------------+ PTV      Full                                                        +---------+---------------+---------+-----------+----------+--------------+ PERO     Full                                                         +---------+---------------+---------+-----------+----------+--------------+   +---------+---------------+---------+-----------+----------+--------------+  LEFT     CompressibilityPhasicitySpontaneityPropertiesThrombus Aging +---------+---------------+---------+-----------+----------+--------------+ CFV      Full           Yes      Yes                                 +---------+---------------+---------+-----------+----------+--------------+ SFJ      Full                                                        +---------+---------------+---------+-----------+----------+--------------+ FV Prox  Full                                                        +---------+---------------+---------+-----------+----------+--------------+ FV Mid   Full                                                        +---------+---------------+---------+-----------+----------+--------------+ FV DistalFull                                                        +---------+---------------+---------+-----------+----------+--------------+ PFV      Full                                                        +---------+---------------+---------+-----------+----------+--------------+ POP      Full           Yes      Yes                                 +---------+---------------+---------+-----------+----------+--------------+ PTV      Full                                                        +---------+---------------+---------+-----------+----------+--------------+ PERO     Full                                                        +---------+---------------+---------+-----------+----------+--------------+     Summary: RIGHT: - There is no evidence of deep vein thrombosis in the lower extremity.  - No cystic structure found in the popliteal fossa.  LEFT: - There is no evidence of deep vein thrombosis in the lower extremity.  - No  cystic structure found in the popliteal fossa.  *See table(s) above for measurements and observations. Electronically signed by Coral Else MD on 02/16/2020 at 9:33:24 PM.    Final    ECHOCARDIOGRAM LIMITED  Result Date: 02/16/2020    ECHOCARDIOGRAM LIMITED REPORT   Patient Name:   HOLMAN BONSIGNORE Date of Exam: 02/16/2020 Medical Rec #:  [id number]       Height:       72.0 in Accession #:    [id number]      Weight:       183.0 lb Date of Birth:  21-May-1935       BSA:          2.052 m Patient Age:    84 years        BP:           114/53 mmHg Patient Gender: M               HR:           77 bpm. Exam Location:  Inpatient Procedure: Limited Echo, Cardiac Doppler and Color Doppler Indications:    Elevated Troponin  History:        Patient has prior history of Echocardiogram examinations, most                 recent 02/17/2019. CHF and Cardiomyopathy, CAD, Arrythmias:Atrial                 Fibrillation, Signs/Symptoms:Shortness of Breath and Fever; Risk                 Factors:Hypertension, Dyslipidemia, OBESITY and Diabetes.                 COVID+.  Sonographer:    Lavenia Atlas Referring Phys: [id number] Kasandra Knudsen PAHWANI IMPRESSIONS  1. Left ventricular ejection fraction, by estimation, is 55 to 60%.  2. Right ventricular systolic function is normal. The right ventricular size is normal. There is normal pulmonary artery systolic pressure.  3. The interatrial septum is aneuyrsmal with frequent bowing from right to left.  4. The mitral valve is normal in structure. Trivial mitral valve regurgitation.  5. There is mild calcification of the aortic valve. There is mild thickening of the aortic valve. Mild aortic valve sclerosis is present, with no evidence of aortic valve stenosis. Comparison(s): Compared to prior echo on 01/2019, the LVEF appears mildly improved to 55-60%. FINDINGS  Left Ventricle: Septal motion is abnormal consistent with prior surgery. Left ventricular ejection fraction, by estimation, is 55 to 60%.  The left ventricular internal cavity size was normal in size. There is no left ventricular hypertrophy. Right Ventricle: The right ventricular size is normal. Right ventricular systolic function is normal. There is normal pulmonary artery systolic pressure. The tricuspid regurgitant velocity is 2.41 m/s, and with an assumed right atrial pressure of 3 mmHg,  the estimated right ventricular systolic pressure is 26.2 mmHg. Left Atrium: Left atrial size was normal in size. Pericardium: There is no evidence of pericardial effusion. Mitral Valve: The mitral valve is normal in structure. There is mild thickening of the mitral valve leaflet(s). Mild mitral annular calcification. Trivial mitral valve regurgitation. Tricuspid Valve: The tricuspid valve is normal in structure. Tricuspid valve regurgitation is mild. Aortic Valve: There is mild calcification of the aortic valve. There is mild thickening of the aortic valve. Mild aortic valve sclerosis is present, with no evidence of aortic valve stenosis. Pulmonic Valve: The pulmonic valve was not assessed. Aorta:  The aortic root is normal in size and structure. LEFT VENTRICLE PLAX 2D LVIDd:         3.80 cm  Diastology LVIDs:         2.70 cm  LV e' medial:    7.51 cm/s LV PW:         1.20 cm  LV E/e' medial:  7.7 LV IVS:        0.90 cm  LV e' lateral:   5.98 cm/s LVOT diam:     1.90 cm  LV E/e' lateral: 9.7 LVOT Area:     2.84 cm  RIGHT VENTRICLE RV S prime:     13.30 cm/s LEFT ATRIUM         Index LA diam:    2.80 cm 1.36 cm/m   AORTA Ao Root diam: 2.50 cm MITRAL VALVE               TRICUSPID VALVE MV Area (PHT): 5.38 cm    TR Peak grad:   23.2 mmHg MV Decel Time: 141 msec    TR Vmax:        241.00 cm/s MV E velocity: 57.80 cm/s MV A velocity: 70.30 cm/s  SHUNTS MV E/A ratio:  0.82        Systemic Diam: 1.90 cm Laurance Flatten MD Electronically signed by Laurance Flatten MD Signature Date/Time: 02/16/2020/4:23:30 PM    Final    ABORTED INVASIVE LAB PROCEDURE  Result  Date: 02/15/2020 This case was aborted.  US Abdomen Limited RUQ  Result Date: 02/15/2020 CLINICAL DATA:  Elevated liver enzymes. EXAM: ULTRASOUND ABDOMEN LIMITED RIGHT UPPER QUADRANT COMPARISON:  CT abdomen 07/06/2019. FINDINGS: Gallbladder: Gallstones measuring up to 7 mm noted. Gallbladder wall thickness normal. No Murphy sign. Common bile duct: Diameter: Not visualized due to overlying bowel gas. Liver: Left no not visualized due to overlying bowel gas. No focal lesion identified. Within normal limits in parenchymal echogenicity. Portal vein is patent on color Doppler imaging with normal direction of blood flow towards the liver. Other: Limited study due to overlying bowel gas. IMPRESSION: 1.  Multiple small gallstones.  No gallbladder wall thickening. 2. Limited exam due to overlying bowel gas. Common bile duct not visualized. Portions of the liver not visualized. Electronically Signed   By: Maisie Fus  Register   On: 02/15/2020 15:35

## 2020-02-27 DIAGNOSIS — J1282 Pneumonia due to Coronavirus disease 2019: Secondary | ICD-10-CM | POA: Diagnosis not present

## 2020-02-27 DIAGNOSIS — E119 Type 2 diabetes mellitus without complications: Secondary | ICD-10-CM | POA: Diagnosis not present

## 2020-02-27 DIAGNOSIS — J9601 Acute respiratory failure with hypoxia: Secondary | ICD-10-CM | POA: Diagnosis not present

## 2020-02-27 DIAGNOSIS — U071 COVID-19: Secondary | ICD-10-CM | POA: Diagnosis not present

## 2020-02-27 LAB — BASIC METABOLIC PANEL
Anion gap: 11 (ref 5–15)
BUN: 28 mg/dL — ABNORMAL HIGH (ref 8–23)
CO2: 30 mmol/L (ref 22–32)
Calcium: 8.2 mg/dL — ABNORMAL LOW (ref 8.9–10.3)
Chloride: 96 mmol/L — ABNORMAL LOW (ref 98–111)
Creatinine, Ser: 1.07 mg/dL (ref 0.61–1.24)
GFR calc Af Amer: >60 mL/min (ref >60)
GFR calc non Af Amer: >60 mL/min (ref >60)
Glucose, Bld: 148 mg/dL — ABNORMAL HIGH (ref 70–99)
Potassium: 4.9 mmol/L (ref 3.5–5.1)
Sodium: 137 mmol/L (ref 135–145)

## 2020-02-27 LAB — CBC
HCT: 45.5 % (ref 39.0–52.0)
Hemoglobin: 15 g/dL (ref 13.0–17.0)
MCH: 30.9 pg (ref 26.0–34.0)
MCHC: 33 g/dL (ref 30.0–36.0)
MCV: 93.8 fL (ref 80.0–100.0)
Platelets: 272 10*3/uL (ref 150–400)
RBC: 4.85 MIL/uL (ref 4.22–5.81)
RDW: 12.9 % (ref 11.5–15.5)
WBC: 11.1 10*3/uL — ABNORMAL HIGH (ref 4.0–10.5)
nRBC: 0 % (ref 0.0–0.2)

## 2020-02-27 MED ORDER — FLEET ENEMA 7-19 GM/118ML RE ENEM: 1.0000 | ENEMA | Freq: Once | RECTAL | Status: DC

## 2020-02-27 NOTE — Progress Notes (Signed)
Upon entering the room, pt states  that he has not moved his bowels in 10 days.   Pt already had 2 bowel movements earlier today

## 2020-02-27 NOTE — Progress Notes (Signed)
Pt is oriented, however forgetful. Pt believes he has not  had a bowel movement in 12 days. Pt also told the Nurse tech that he hasn't had a bowel movement is 16 days. Please see the chart and note that the pt goes almost every day, sometimes twice per day

## 2020-02-27 NOTE — Progress Notes (Signed)
PROGRESS NOTE                                                                                                                                                                                                             Patient Demographics:    Gordon Novak, is a 85 y.o. male, DOB - 1934-10-29, LKG:[id number]  Outpatient Primary MD for the patient is [name], MD   Admit date - 02/15/2020   LOS - 12   Brief Narrative: Patient is a 85 y.o. male with PMHx of CAD s/p PCI 2020, DM-2, HLD, HTN, history of PE no longer on anticoagulation-presented with shortness of breath-found to have severe hypoxemia requiring high flow oxygen-due to COVID-19 pneumonia.  COVID-19 vaccinated status: Vaccinated  Significant Events: 9/21>> Admit to [facility] for hypoxia due to COVID-19 pneumonia  Significant studies: 9/21>>Chest x-ray: Diffuse lung infiltrates 9/22>> CTA chest: No PE-severe bilateral patchy/groundglass appearing infiltrates, cholelithiasis 9/22>> bilateral lower extremity Doppler: No DVT 9/22>> Echo: EF 55-60%, RV systolic function normal. 9/30>> chest x-ray: Patchy bilateral airspace disease-not significantly changed from prior.  COVID-19 medications: Steroids: 9/21>> Remdesivir: 9/21>> 9/25 Baricitinib: 9/21>>  Antibiotics: Rocephin: 9/22>> 9/26 Zithromax: 9/22>> 9/26  Microbiology data: 9/21 >>blood culture: No growth  Procedures: None  Consults: Cardiology  DVT prophylaxis: enoxaparin (LOVENOX) injection 40 mg at twice daily dosing (intermediate dosing) SCDs Start: 02/15/20 1300    Subjective:   Patient mentions that he continues to feel stronger and has less shortness of breath today compared to yesterday.  Occasional dry cough.  No chest pain.     Assessment  & Plan :   Acute Hypoxic Resp Failure due to Covid 19 Viral pneumonia Patient has completed course of Remdesivir.  Remains on steroids and  baricitinib.  Has chronic left lower extremity edema.  Continue oral Lasix that he is getting daily.  Keep him in negative fluid balance. Patient's oxygen requirements are stable.  Still on 9 L of oxygen by high flow.  Work of breathing has improved.  He is saturating in the late 80s to early 90s.  Continue with incentive spirometry and mobilization.   Elevated troponins Minimally elevated troponins with flat trend-not consistent with ACS-echo with preserved EF.  Cardiology evaluation completed-no recommendations apart from supportive care.    Minimally elevated D-dimer Has been trending downwards.  Has a history  of PE in 2020 and completed 1 year treatment with Xarelto in May 2021.  CT angiogram chest and Doppler studies negative for VTE.  Continue intermediate dosing with Lovenox.    Transaminitis Likely secondary to COVID-19.  Has been improving  Chronic systolic heart failure Echocardiogram done during this hospitalization shows normal systolic function.  Continue oral Lasix.  Keep in negative fluid balance.  Strict ins and outs and daily weights.  Electrolytes are stable.   History of CAD Stable.  Seen by cardiology initially due to elevated troponin.  Currently without any chest pain.  Continue medical management.  On aspirin beta-blocker and statin.    DM-2 (A1c 7.1 on 9/21) Noted to have hypoglycemia on 9/30 and dose of Levemir was decreased.  CBGs are stable.  Gout Continue allopurinol  BPH Continue Flomax  Chronic left lower extremity swelling At baseline-lower extremity Dopplers negative.  Constipation Patient on MiraLAX twice a day.  Still has not had a bowel movement.  Fleet enema x1.  DVT prophylaxis: Lovenox Family Communication  :  Daughter [name] [phone]).  Voicemail left yesterday. we will try again today.. Code Status : DNR  Diet :  Diet Order            Diet heart healthy/carb modified Room service appropriate? No; Fluid consistency: Thin  Diet  effective now                  Disposition Plan  : Home health recommended by PT/OT Status is: Inpatient  Remains inpatient appropriate because:Inpatient level of care appropriate due to severity of illness   Dispo: The patient is from: Home              Anticipated d/c is to: Home              Anticipated d/c date is: > 3 days              Patient currently is not medically stable to d/c.  Barriers to discharge: Continues to have high oxygen requirements  Antimicorbials  :    Anti-infectives (From admission, onward)   Start     Dose/Rate Route Frequency Ordered Stop   02/16/20 1330  azithromycin (ZITHROMAX) 500 mg in sodium chloride 0.9 % 250 mL IVPB        500 mg 250 mL/hr over 60 Minutes Intravenous Every 24 hours 02/16/20 1237 02/20/20 1320   02/16/20 1300  cefTRIAXone (ROCEPHIN) 2 g in sodium chloride 0.9 % 100 mL IVPB        2 g 200 mL/hr over 30 Minutes Intravenous Every 24 hours 02/16/20 1237 02/20/20 1252   02/16/20 1000  remdesivir 100 mg in sodium chloride 0.9 % 100 mL IVPB       "Followed by" Linked Group Details   100 mg 200 mL/hr over 30 Minutes Intravenous Daily 02/15/20 1301 02/19/20 0855   02/15/20 1400  remdesivir 200 mg in sodium chloride 0.9% 250 mL IVPB       "Followed by" Linked Group Details   200 mg 580 mL/hr over 30 Minutes Intravenous Once 02/15/20 1301 02/15/20 1504      Inpatient Medications  Scheduled Meds: . allopurinol  300 mg Oral Daily  . vitamin C  500 mg Oral Daily  . aspirin EC  81 mg Oral Daily  . baricitinib  4 mg Oral Daily  . enoxaparin (LOVENOX) injection  40 mg Subcutaneous BID  . furosemide  40 mg Oral Daily  . insulin  aspart  0-15 Units Subcutaneous TID WC  . insulin aspart  0-5 Units Subcutaneous QHS  . insulin detemir  10 Units Subcutaneous Daily  . linagliptin  5 mg Oral Daily  . methylPREDNISolone (SOLU-MEDROL) injection  60 mg Intravenous Q12H  . metoprolol succinate  12.5 mg Oral QHS  . mometasone-formoterol  2  puff Inhalation BID  . pantoprazole  40 mg Oral Daily  . polyethylene glycol  17 g Oral BID  . prednisoLONE acetate  1 drop Both Eyes QHS  . rosuvastatin  10 mg Oral Daily  . senna-docusate  2 tablet Oral QHS  . sodium phosphate  1 enema Rectal Once  . tamsulosin  0.4 mg Oral QHS  . zinc sulfate  220 mg Oral Daily   Continuous Infusions: . sodium chloride 10 mL/hr at 02/17/20 0500   PRN Meds:.acetaminophen, bisacodyl, chlorpheniramine-HYDROcodone, guaiFENesin-dextromethorphan, ondansetron **OR** ondansetron (ZOFRAN) IV, sodium phosphate   See all Orders from today for further details   Osvaldo Shipper M.D on 02/27/2020 at 11:06 AM  To page go to [email or web address] - use universal password  [facility] -  Office  [phone]    Objective:   Vitals:   02/26/20 1955 02/27/20 0013 02/27/20 0351 02/27/20 0731  BP: (!) 111/56 102/60 128/60 (!) 115/56  Pulse: 100 81 97 75  Resp: 17 13 20 20   Temp: 97.8 F (36.6 C) 98.1 F (36.7 C) 98.3 F (36.8 C) 98.1 F (36.7 C)  TempSrc: Axillary Axillary Axillary Oral  SpO2: 97% 99% 100% 94%  Weight:      Height:        Wt Readings from Last 3 Encounters:  02/25/20 83.4 kg  02/09/20 83.9 kg  08/30/19 83 kg     Intake/Output Summary (Last 24 hours) at 02/27/2020 1106 Last data filed at 02/27/2020 0900 Gross per 24 hour  Intake 360 ml  Output 1125 ml  Net -765 ml     Physical Exam  General appearance: Awake alert.  In no distress Resp: Improved effort.  Continues to have coarse breath sounds with crackles at the bases.  No wheezing or rhonchi. Cardio: S1-S2 is normal regular.  No S3-S4.  No rubs murmurs or bruit GI: Abdomen is soft.  Nontender nondistended.  Bowel sounds are present normal.  No masses organomegaly Extremities: Chronic edema bilateral lower extremities left greater than right. Neurologic: No focal neurological deficits.       Data Review:    CBC Recent Labs  Lab 02/23/20 0946 02/24/20 1256  02/25/20 0803 02/26/20 0500 02/27/20 0440  WBC 19.8* 15.3* 10.6* 8.4 11.1*  HGB 15.3 15.1 13.5 13.3 15.0  HCT 46.6 45.1 40.4 40.2 45.5  PLT 317 296 234 218 272  MCV 93.6 94.0 93.7 92.8 93.8  MCH 30.7 31.5 31.3 30.7 30.9  MCHC 32.8 33.5 33.4 33.1 33.0  RDW 13.2 13.1 13.1 12.8 12.9    Chemistries  Recent Labs  Lab 02/22/20 0516 02/22/20 0516 02/23/20 0946 02/24/20 1256 02/25/20 0803 02/26/20 0500 02/27/20 0440  NA 140   < > 137 136 138 136 137  K 4.7   < > 4.3 4.5 5.0 5.1 4.9  CL 106   < > 104 99 102 99 96*  CO2 28   < > 23 27 29 30 30   GLUCOSE 143*   < > 124* 216* 148* 112* 148*  BUN 36*   < > 36* 29* 29* 32* 28*  CREATININE 0.92   < > 0.95 0.93 0.81 0.99 1.07  CALCIUM 7.9*   < > 8.1* 8.0* 7.9* 8.0* 8.2*  AST 30  --  54* 53* 30 34  --   ALT 74*  --  83* 94* 69* 72*  --   ALKPHOS 83  --  95 95 74 77  --   BILITOT 1.0  --  1.2 0.9 1.1 1.0  --    < > = values in this interval not displayed.     Recent Labs    02/24/20 1256 02/25/20 0803  DDIMER 2.26* 1.70*        Component Value Date/Time   BNP 199.1 (H) 02/15/2020 1040    Micro Results No results found for this or any previous visit (from the past 240 hour(s)).  Radiology Reports CT Head Wo Contrast  Result Date: 02/09/2020 CLINICAL DATA:  Trauma, fall on blood thinners EXAM: CT HEAD WITHOUT CONTRAST CT CERVICAL SPINE WITHOUT CONTRAST TECHNIQUE: Multidetector CT imaging of the head and cervical spine was performed following the standard protocol without intravenous contrast. Multiplanar CT image reconstructions of the cervical spine were also generated. COMPARISON:  None. FINDINGS: CT HEAD FINDINGS Brain: No acute territorial infarction, hemorrhage, or intracranial mass. Moderate atrophy. Mild hypodensity in the white matter consistent with chronic small vessel ischemic change. The ventricles are nonenlarged. Vascular: No hyperdense vessels.  Carotid vascular calcification. Skull: No depressed skull fracture.  Sinuses/Orbits: Mucosal thickening in the maxillary and ethmoid sinuses. Small fluid level in the left sphenoid sinus but no central skull base lucency is seen. Possible chronic left nasal bone fracture. Other: Mild right posterior scalp swelling. CT CERVICAL SPINE FINDINGS Alignment: No subluxation.  Facet alignment within normal limits. Skull base and vertebrae: No acute fracture. No primary bone lesion or focal pathologic process. Soft tissues and spinal canal: No prevertebral fluid or swelling. No visible canal hematoma. Disc levels: Multiple level degenerative change with moderate severe disease at C5-C6 and C6-C7. Facet degenerative changes at multiple levels with foraminal narrowing. Upper chest: Apical scarring Other: None IMPRESSION: 1. No CT evidence for acute intracranial abnormality. Atrophy and mild chronic small vessel ischemic changes of the white matter. 2. Degenerative changes of the cervical spine. No acute osseous abnormality. Electronically Signed   By: Jasmine Pang M.D.   On: 02/09/2020 01:13   CT ANGIO CHEST PE W OR WO CONTRAST  Result Date: 02/16/2020 CLINICAL DATA:  COVID positive peer EXAM: CT ANGIOGRAPHY CHEST WITH CONTRAST TECHNIQUE: Multidetector CT imaging of the chest was performed using the standard protocol during bolus administration of intravenous contrast. Multiplanar CT image reconstructions and MIPs were obtained to evaluate the vascular anatomy. CONTRAST:  OMNIPAQUE IOHEXOL 350 MG/ML SOLN COMPARISON:  Oct 22, 2018 FINDINGS: Cardiovascular: There is moderate severity calcification of the thoracic aorta. Satisfactory opacification of the pulmonary arteries to the segmental level. No evidence of pulmonary embolism. Normal heart size. No pericardial effusion. Mediastinum/Nodes: No enlarged mediastinal, hilar, or axillary lymph nodes. Thyroid gland, trachea, and esophagus demonstrate no significant findings. Lungs/Pleura: Marked severity patchy and ground-glass appearing  infiltrates are seen throughout both lungs. There is no evidence of a pleural effusion or pneumothorax. Upper Abdomen: Numerous subcentimeter gallstones are seen within the lumen of an otherwise normal-appearing gallbladder. Musculoskeletal: No chest wall abnormality. No acute or significant osseous findings. Review of the MIP images confirms the above findings. IMPRESSION: 1. Marked severity bilateral patchy and ground-glass appearing infiltrates. 2. Cholelithiasis. 3. Aortic atherosclerosis. Aortic Atherosclerosis (ICD10-I70.0). Electronically Signed   By: Aram Candela M.D.   On: 02/16/2020  22:13   CT Cervical Spine Wo Contrast  Result Date: 02/09/2020 CLINICAL DATA:  Trauma, fall on blood thinners EXAM: CT HEAD WITHOUT CONTRAST CT CERVICAL SPINE WITHOUT CONTRAST TECHNIQUE: Multidetector CT imaging of the head and cervical spine was performed following the standard protocol without intravenous contrast. Multiplanar CT image reconstructions of the cervical spine were also generated. COMPARISON:  None. FINDINGS: CT HEAD FINDINGS Brain: No acute territorial infarction, hemorrhage, or intracranial mass. Moderate atrophy. Mild hypodensity in the white matter consistent with chronic small vessel ischemic change. The ventricles are nonenlarged. Vascular: No hyperdense vessels.  Carotid vascular calcification. Skull: No depressed skull fracture. Sinuses/Orbits: Mucosal thickening in the maxillary and ethmoid sinuses. Small fluid level in the left sphenoid sinus but no central skull base lucency is seen. Possible chronic left nasal bone fracture. Other: Mild right posterior scalp swelling. CT CERVICAL SPINE FINDINGS Alignment: No subluxation.  Facet alignment within normal limits. Skull base and vertebrae: No acute fracture. No primary bone lesion or focal pathologic process. Soft tissues and spinal canal: No prevertebral fluid or swelling. No visible canal hematoma. Disc levels: Multiple level degenerative change  with moderate severe disease at C5-C6 and C6-C7. Facet degenerative changes at multiple levels with foraminal narrowing. Upper chest: Apical scarring Other: None IMPRESSION: 1. No CT evidence for acute intracranial abnormality. Atrophy and mild chronic small vessel ischemic changes of the white matter. 2. Degenerative changes of the cervical spine. No acute osseous abnormality. Electronically Signed   By: Jasmine Pang M.D.   On: 02/09/2020 01:13   DG Chest Port 1 View  Result Date: 02/15/2020 CLINICAL DATA:  COVID positive.  Hypoxia. EXAM: PORTABLE CHEST 1 VIEW COMPARISON:  Oct 22, 2018 FINDINGS: Diffuse bilateral interstitial and airspace opacities, new from prior. No visible pleural effusion. No pneumothorax. Cardiac silhouette is accentuated by low lung volumes and portable technique. Aortic atherosclerosis. IMPRESSION: Diffuse bilateral interstitial and airspace opacities, likely multifocal pneumonia given the clinical history. Electronically Signed   By: Feliberto Harts MD   On: 02/15/2020 10:56   DG Chest Port 1V same Day  Result Date: 02/24/2020 CLINICAL DATA:  COVID EXAM: PORTABLE CHEST 1 VIEW COMPARISON:  02/15/2020 FINDINGS: Bilateral patchy airspace opacities are again noted, similar prior study. Slight increase in lung volumes. Heart is normal size. No effusions or pneumothorax. No acute bony abnormality. IMPRESSION: Patchy bilateral airspace disease, not significantly changed since prior study. Slight increased lung volumes. Electronically Signed   By: Charlett Nose M.D.   On: 02/24/2020 08:31   VAS [place] LOWER EXTREMITY VENOUS (DVT)  Result Date: 02/16/2020  Lower Venous DVTStudy Indications: Swelling, and elevated d dimer.  Risk Factors: + covid. Performing Technologist: Jannet Askew RCT RDMS  Examination Guidelines: A complete evaluation includes B-mode imaging, spectral Doppler, color Doppler, and power Doppler as needed of all accessible portions of each vessel. Bilateral testing is  considered an integral part of a complete examination. Limited examinations for reoccurring indications may be performed as noted. The reflux portion of the exam is performed with the patient in reverse Trendelenburg.  +---------+---------------+---------+-----------+----------+--------------+ RIGHT    CompressibilityPhasicitySpontaneityPropertiesThrombus Aging +---------+---------------+---------+-----------+----------+--------------+ CFV      Full           Yes      Yes                                 +---------+---------------+---------+-----------+----------+--------------+ SFJ      Full                                                        +---------+---------------+---------+-----------+----------+--------------+  FV Prox  Full                                                        +---------+---------------+---------+-----------+----------+--------------+ FV Mid   Full                                                        +---------+---------------+---------+-----------+----------+--------------+ FV DistalFull                                                        +---------+---------------+---------+-----------+----------+--------------+ PFV      Full                                                        +---------+---------------+---------+-----------+----------+--------------+ POP      Full           Yes      Yes                                 +---------+---------------+---------+-----------+----------+--------------+ PTV      Full                                                        +---------+---------------+---------+-----------+----------+--------------+ PERO     Full                                                        +---------+---------------+---------+-----------+----------+--------------+   +---------+---------------+---------+-----------+----------+--------------+ LEFT      CompressibilityPhasicitySpontaneityPropertiesThrombus Aging +---------+---------------+---------+-----------+----------+--------------+ CFV      Full           Yes      Yes                                 +---------+---------------+---------+-----------+----------+--------------+ SFJ      Full                                                        +---------+---------------+---------+-----------+----------+--------------+ FV Prox  Full                                                        +---------+---------------+---------+-----------+----------+--------------+  FV Mid   Full                                                        +---------+---------------+---------+-----------+----------+--------------+ FV DistalFull                                                        +---------+---------------+---------+-----------+----------+--------------+ PFV      Full                                                        +---------+---------------+---------+-----------+----------+--------------+ POP      Full           Yes      Yes                                 +---------+---------------+---------+-----------+----------+--------------+ PTV      Full                                                        +---------+---------------+---------+-----------+----------+--------------+ PERO     Full                                                        +---------+---------------+---------+-----------+----------+--------------+     Summary: RIGHT: - There is no evidence of deep vein thrombosis in the lower extremity.  - No cystic structure found in the popliteal fossa.  LEFT: - There is no evidence of deep vein thrombosis in the lower extremity.  - No cystic structure found in the popliteal fossa.  *See table(s) above for measurements and observations. Electronically signed by Coral Else MD on 02/16/2020 at 9:33:24 PM.    Final    ECHOCARDIOGRAM  LIMITED  Result Date: 02/16/2020    ECHOCARDIOGRAM LIMITED REPORT   Patient Name:   EDWEN MCLESTER Date of Exam: 02/16/2020 Medical Rec #:  [id number]       Height:       72.0 in Accession #:    [id number]      Weight:       183.0 lb Date of Birth:  1935-05-24       BSA:          2.052 m Patient Age:    84 years        BP:           114/53 mmHg Patient Gender: M               HR:           77 bpm. Exam Location:  Inpatient Procedure: Limited Echo, Cardiac Doppler and  Color Doppler Indications:    Elevated Troponin  History:        Patient has prior history of Echocardiogram examinations, most                 recent 02/17/2019. CHF and Cardiomyopathy, CAD, Arrythmias:Atrial                 Fibrillation, Signs/Symptoms:Shortness of Breath and Fever; Risk                 Factors:Hypertension, Dyslipidemia, OBESITY and Diabetes.                 COVID+.  Sonographer:    Lavenia Atlas Referring Phys: [id number] Kasandra Knudsen PAHWANI IMPRESSIONS  1. Left ventricular ejection fraction, by estimation, is 55 to 60%.  2. Right ventricular systolic function is normal. The right ventricular size is normal. There is normal pulmonary artery systolic pressure.  3. The interatrial septum is aneuyrsmal with frequent bowing from right to left.  4. The mitral valve is normal in structure. Trivial mitral valve regurgitation.  5. There is mild calcification of the aortic valve. There is mild thickening of the aortic valve. Mild aortic valve sclerosis is present, with no evidence of aortic valve stenosis. Comparison(s): Compared to prior echo on 01/2019, the LVEF appears mildly improved to 55-60%. FINDINGS  Left Ventricle: Septal motion is abnormal consistent with prior surgery. Left ventricular ejection fraction, by estimation, is 55 to 60%. The left ventricular internal cavity size was normal in size. There is no left ventricular hypertrophy. Right Ventricle: The right ventricular size is normal. Right ventricular systolic function is  normal. There is normal pulmonary artery systolic pressure. The tricuspid regurgitant velocity is 2.41 m/s, and with an assumed right atrial pressure of 3 mmHg,  the estimated right ventricular systolic pressure is 26.2 mmHg. Left Atrium: Left atrial size was normal in size. Pericardium: There is no evidence of pericardial effusion. Mitral Valve: The mitral valve is normal in structure. There is mild thickening of the mitral valve leaflet(s). Mild mitral annular calcification. Trivial mitral valve regurgitation. Tricuspid Valve: The tricuspid valve is normal in structure. Tricuspid valve regurgitation is mild. Aortic Valve: There is mild calcification of the aortic valve. There is mild thickening of the aortic valve. Mild aortic valve sclerosis is present, with no evidence of aortic valve stenosis. Pulmonic Valve: The pulmonic valve was not assessed. Aorta: The aortic root is normal in size and structure. LEFT VENTRICLE PLAX 2D LVIDd:         3.80 cm  Diastology LVIDs:         2.70 cm  LV e' medial:    7.51 cm/s LV PW:         1.20 cm  LV E/e' medial:  7.7 LV IVS:        0.90 cm  LV e' lateral:   5.98 cm/s LVOT diam:     1.90 cm  LV E/e' lateral: 9.7 LVOT Area:     2.84 cm  RIGHT VENTRICLE RV S prime:     13.30 cm/s LEFT ATRIUM         Index LA diam:    2.80 cm 1.36 cm/m   AORTA Ao Root diam: 2.50 cm MITRAL VALVE               TRICUSPID VALVE MV Area (PHT): 5.38 cm    TR Peak grad:   23.2 mmHg MV Decel Time: 141 msec    TR Vmax:  241.00 cm/s MV E velocity: 57.80 cm/s MV A velocity: 70.30 cm/s  SHUNTS MV E/A ratio:  0.82        Systemic Diam: 1.90 cm Laurance FlattenHeather Pemberton MD Electronically signed by Laurance FlattenHeather Pemberton MD Signature Date/Time: 02/16/2020/4:23:30 PM    Final    ABORTED INVASIVE LAB PROCEDURE  Result Date: 02/15/2020 This case was aborted.  US Abdomen Limited RUQ  Result Date: 02/15/2020 CLINICAL DATA:  Elevated liver enzymes. EXAM: ULTRASOUND ABDOMEN LIMITED RIGHT UPPER QUADRANT COMPARISON:   CT abdomen 07/06/2019. FINDINGS: Gallbladder: Gallstones measuring up to 7 mm noted. Gallbladder wall thickness normal. No Murphy sign. Common bile duct: Diameter: Not visualized due to overlying bowel gas. Liver: Left no not visualized due to overlying bowel gas. No focal lesion identified. Within normal limits in parenchymal echogenicity. Portal vein is patent on color Doppler imaging with normal direction of blood flow towards the liver. Other: Limited study due to overlying bowel gas. IMPRESSION: 1.  Multiple small gallstones.  No gallbladder wall thickening. 2. Limited exam due to overlying bowel gas. Common bile duct not visualized. Portions of the liver not visualized. Electronically Signed   By: Maisie Fushomas  Register   On: 02/15/2020 15:35

## 2020-02-28 ENCOUNTER — Inpatient Hospital Stay (HOSPITAL_COMMUNITY): Payer: Medicare Other

## 2020-02-28 DIAGNOSIS — E875 Hyperkalemia: Secondary | ICD-10-CM | POA: Diagnosis not present

## 2020-02-28 DIAGNOSIS — U071 COVID-19: Secondary | ICD-10-CM | POA: Diagnosis not present

## 2020-02-28 DIAGNOSIS — J9601 Acute respiratory failure with hypoxia: Secondary | ICD-10-CM | POA: Diagnosis not present

## 2020-02-28 DIAGNOSIS — E119 Type 2 diabetes mellitus without complications: Secondary | ICD-10-CM | POA: Diagnosis not present

## 2020-02-28 LAB — BASIC METABOLIC PANEL
Anion gap: 7 (ref 5–15)
BUN: 28 mg/dL — ABNORMAL HIGH (ref 8–23)
CO2: 30 mmol/L (ref 22–32)
Calcium: 8.1 mg/dL — ABNORMAL LOW (ref 8.9–10.3)
Chloride: 99 mmol/L (ref 98–111)
Creatinine, Ser: 1.09 mg/dL (ref 0.61–1.24)
GFR calc Af Amer: >60 mL/min (ref >60)
GFR calc non Af Amer: >60 mL/min (ref >60)
Glucose, Bld: 185 mg/dL — ABNORMAL HIGH (ref 70–99)
Potassium: 5.3 mmol/L — ABNORMAL HIGH (ref 3.5–5.1)
Sodium: 136 mmol/L (ref 135–145)

## 2020-02-28 MED ORDER — SODIUM ZIRCONIUM CYCLOSILICATE 10 G PO PACK
10.0000 g | PACK | Freq: Once | ORAL | Status: AC
  Administered 2020-02-28: 10 g via ORAL
  Filled 2020-02-28: qty 1

## 2020-02-28 MED ORDER — METHYLPREDNISOLONE SODIUM SUCC 40 MG IJ SOLR
40.0000 mg | Freq: Two times a day (BID) | INTRAMUSCULAR | Status: DC
  Administered 2020-02-28 – 2020-03-08 (×18): 40 mg via INTRAVENOUS
  Filled 2020-02-28 (×18): qty 1

## 2020-02-28 MED ORDER — FUROSEMIDE 20 MG PO TABS
20.0000 mg | ORAL_TABLET | Freq: Every day | ORAL | Status: DC
  Administered 2020-02-28 – 2020-03-06 (×8): 20 mg via ORAL
  Filled 2020-02-28 (×8): qty 1

## 2020-02-28 NOTE — Progress Notes (Signed)
Physical Therapy Treatment Patient Details Name: Gordon Novak MRN: [id number] DOB: 10-16-1934 Today's Date: 02/28/2020    History of Present Illness Pt is an 85 y.o. male who tested (+) COVID-19 on 02/07/20, now admitted 02/15/20 with hypoxia; workup for acute hypoxemic respiratory failure due to COVID-19 PNA with severe lung parenchymal injury. PMH includes CAD, HTN, HLD, DM2, PE; pt fully vaccinated.    PT Comments    Continuing work on functional mobility and activity tolerance;  Still requires a lot of supplemental O2, HHFNC+NRB, and still desaturates quickly with activity; Notable also was a BP drop in standing  -- pt was asymptomatic for lightheadedness or dizziness; consider getting a full set of Orhtostatic BPs (especially 3 minute standing if he can tolerate) next session    02/28/20 1640  Vital Signs  Patient Position (if appropriate) Orthostatic Vitals  Orthostatic Sitting  BP- Sitting 104/62  Pulse- Sitting 91  Orthostatic Standing at 0 minutes  BP- Standing at 0 minutes (!) 88/44 (asymptomatic for lightheadedness)  Pulse- Standing at 0 minutes 96     Follow Up Recommendations  Home health PT;Supervision for mobility/OOB     Equipment Recommendations  Rolling walker with 5" wheels;3in1 (PT)    Recommendations for Other Services       Precautions / Restrictions Precautions Precautions: Fall;Other (comment) Precaution Comments: Quick to desaturate     Mobility  Bed Mobility               General bed mobility comments: Received sitting in recliner  Transfers Overall transfer level: Needs assistance Equipment used: Rolling walker (2 wheeled) Transfers: Sit to/from Stand Sit to Stand: Min guard;Min assist         General transfer comment: Min guard A for safety; cues for hand placement and to control descent to sit; Min assist for control with stand to sit  Ambulation/Gait Ambulation/Gait assistance: Min assist Gait Distance (Feet):  (March in  place) Assistive device: Rolling walker (2 wheeled)       General Gait Details: Bouts of marching in place with min guard, stability improved with RW; close monitor of O2 sats; notable fatigue with incr time standing, obtained standing BP: 82/44, HR 96   Stairs             Wheelchair Mobility    Modified Rankin (Stroke Patients Only)       Balance     Sitting balance-Leahy Scale: Fair     Standing balance support: During functional activity;No upper extremity supported;Single extremity supported Standing balance-Leahy Scale: Poor                              Cognition Arousal/Alertness: Awake/alert Behavior During Therapy: WFL for tasks assessed/performed Overall Cognitive Status: Within Functional Limits for tasks assessed                                 General Comments: WFL for simple tasks, following all commands. Having to repeat at times due to [facility]      Exercises      General Comments General comments (skin integrity, edema, etc.): Rest: SpO2 90s on 15L, HR 90s, RR 20s, SpO2 dropping to 76% with mobility. Increased seated rest break to recover back to >88%      Pertinent Vitals/Pain Pain Assessment: No/denies pain    Home Living  Prior Function            PT Goals (current goals can now be found in the care plan section) Acute Rehab PT Goals Patient Stated Goal: "I want to get back home as soon as I can... I hope our traveling days have not come to an end" PT Goal Formulation: With patient Time For Goal Achievement: 03/08/20 Potential to Achieve Goals: Fair Progress towards PT goals: Progressing toward goals (Slowly though, limited by hypoxia)    Frequency    Min 3X/week      PT Plan Current plan remains appropriate    Co-evaluation              AM-PAC PT "6 Clicks" Mobility   Outcome Measure  Help needed turning from your back to your side while in a flat bed without  using bedrails?: None Help needed moving from lying on your back to sitting on the side of a flat bed without using bedrails?: None Help needed moving to and from a bed to a chair (including a wheelchair)?: A Little Help needed standing up from a chair using your arms (e.g., wheelchair or bedside chair)?: A Little Help needed to walk in hospital room?: A Little Help needed climbing 3-5 steps with a railing? : A Lot 6 Click Score: 19    End of Session Equipment Utilized During Treatment: Oxygen Activity Tolerance: Patient tolerated treatment well;Treatment limited secondary to medical complications (Comment) Patient left: in chair;with call bell/phone within reach Nurse Communication: Mobility status PT Visit Diagnosis: Other abnormalities of gait and mobility (R26.89)     Time: 0539-7673 PT Time Calculation (min) (ACUTE ONLY): 21 min  Charges:  $Therapeutic Activity: 8-22 mins                     Gordon Novak, PT  Acute Rehabilitation Services Pager [phone] Office [phone]    Gordon Novak 02/28/2020, 5:52 PM

## 2020-02-28 NOTE — Progress Notes (Signed)
PROGRESS NOTE                                                                                                                                                                                                             Patient Demographics:    Gordon Novak, is a 85 y.o. male, DOB - [birth year], ZDG:[id number]  Outpatient Primary MD for the patient is [name], MD   Admit date - 02/15/2020   LOS - 13   Brief Narrative: Patient is a 85 y.o. male with PMHx of CAD s/p PCI 2020, DM-2, HLD, HTN, history of PE no longer on anticoagulation-presented with shortness of breath-found to have severe hypoxemia requiring high flow oxygen-due to COVID-19 pneumonia.  COVID-19 vaccinated status: Vaccinated  Significant Events: 9/21>> Admit to [facility] for hypoxia due to COVID-19 pneumonia  Significant studies: 9/21>>Chest x-ray: Diffuse lung infiltrates 9/22>> CTA chest: No PE-severe bilateral patchy/groundglass appearing infiltrates, cholelithiasis 9/22>> bilateral lower extremity Doppler: No DVT 9/22>> Echo: EF 55-60%, RV systolic function normal. 9/30>> chest x-ray: Patchy bilateral airspace disease-not significantly changed from prior.  COVID-19 medications: Steroids: 9/21>> Remdesivir: 9/21>> 9/25 Baricitinib: 9/21>>  Antibiotics: Rocephin: 9/22>> 9/26 Zithromax: 9/22>> 9/26  Microbiology data: 9/21 >>blood culture: No growth  Procedures: None  Consults: Cardiology  DVT prophylaxis: enoxaparin (LOVENOX) injection 40 mg at twice daily dosing (intermediate dosing) SCDs Start: 02/15/20 1300    Subjective:   Patient slightly distracted this morning.  Denies any new complaints.  Noted to be on high amounts of oxygen this morning compared to yesterday.  Denies any worsening in his shortness of breath.  No nausea or vomiting.  Has been having bowel movements per nursing reports.     Assessment  & Plan :   Acute  Hypoxic Resp Failure due to Covid 19 Viral pneumonia Patient has completed course of Remdesivir.  Remains on steroids and baricitinib.  Has chronic left lower extremity edema.   Blood pressure noted to be low this morning.  Will cut back on the dose of Lasix.  He is noted to require more oxygen this morning.  He is on 11 L.  However his work of breathing is about the same.  Saturations are in the early 90s.  We will repeat a chest x-ray today.  Continue with incentive spirometry and mobilization.    Elevated troponins Minimally  elevated troponins with flat trend. Not consistent with ACS. Echo with preserved EF.  Cardiology evaluation completed. No recommendations apart from supportive care.    Minimally elevated D-dimer Has been trending downwards.  Has a history of PE in 2020 and completed 1 year treatment with Xarelto in May 2021.  CT angiogram chest and Doppler studies negative for VTE.  Continue intermediate dosing with Lovenox.  D-dimer had improved to 1.7.  Transaminitis Likely secondary to COVID-19.  Had been improving.  Recheck in the outpatient setting.  Chronic systolic heart failure/hyperkalemia Echocardiogram done during this hospitalization shows normal systolic function.  Continue oral Lasix.  Keep in negative fluid balance.  Strict ins and outs and daily weights.  Decrease dose of Lasix due to low blood pressure.  Potassium noted to be 5.3 today.  Will give Lokelma x1.   History of CAD Stable.  Seen by cardiology initially due to elevated troponin.  Currently without any chest pain.  Continue medical management.  On aspirin beta-blocker and statin.    DM-2 (A1c 7.1 on 9/21) Noted to have hypoglycemia on 9/30 and dose of Levemir was decreased.  CBGs have been stable since then.  Gout Continue allopurinol  BPH Continue Flomax  Chronic left lower extremity swelling At baseline. Lower extremity Dopplers negative.  Constipation Patient keeps thinking that he has not had a  bowel movement but nurses report multiple bowel movements.  Continue MiraLAX.  Cholelithiasis Incidentally noted on ultrasound of the abdomen.  Asymptomatic.  DVT prophylaxis: Lovenox Family Communication  :  Daughter [name] [phone]  Have been updating me every other day.  Voicemail was left the last couple days.   Code Status : DNR  Diet :  Diet Order            Diet heart healthy/carb modified Room service appropriate? No; Fluid consistency: Thin  Diet effective now                  Disposition Plan  : Home health recommended by PT/OT  Status is: Inpatient  Remains inpatient appropriate because:Inpatient level of care appropriate due to severity of illness   Dispo: The patient is from: Home              Anticipated d/c is to: Home              Anticipated d/c date is: > 3 days              Patient currently is not medically stable to d/c.  Barriers to discharge: Continues to have high oxygen requirements  Antimicorbials  :    Anti-infectives (From admission, onward)   Start     Dose/Rate Route Frequency Ordered Stop   02/16/20 1330  azithromycin (ZITHROMAX) 500 mg in sodium chloride 0.9 % 250 mL IVPB        500 mg 250 mL/hr over 60 Minutes Intravenous Every 24 hours 02/16/20 1237 02/20/20 1320   02/16/20 1300  cefTRIAXone (ROCEPHIN) 2 g in sodium chloride 0.9 % 100 mL IVPB        2 g 200 mL/hr over 30 Minutes Intravenous Every 24 hours 02/16/20 1237 02/20/20 1252   02/16/20 1000  remdesivir 100 mg in sodium chloride 0.9 % 100 mL IVPB       "Followed by" Linked Group Details   100 mg 200 mL/hr over 30 Minutes Intravenous Daily 02/15/20 1301 02/19/20 0855   02/15/20 1400  remdesivir 200 mg in sodium chloride 0.9% 250  mL IVPB       "Followed by" Linked Group Details   200 mg 580 mL/hr over 30 Minutes Intravenous Once 02/15/20 1301 02/15/20 1504      Inpatient Medications  Scheduled Meds: . allopurinol  300 mg Oral Daily  . vitamin C  500 mg Oral Daily    . aspirin EC  81 mg Oral Daily  . enoxaparin (LOVENOX) injection  40 mg Subcutaneous BID  . furosemide  20 mg Oral Daily  . insulin aspart  0-15 Units Subcutaneous TID WC  . insulin aspart  0-5 Units Subcutaneous QHS  . insulin detemir  10 Units Subcutaneous Daily  . linagliptin  5 mg Oral Daily  . methylPREDNISolone (SOLU-MEDROL) injection  60 mg Intravenous Q12H  . mometasone-formoterol  2 puff Inhalation BID  . pantoprazole  40 mg Oral Daily  . polyethylene glycol  17 g Oral BID  . prednisoLONE acetate  1 drop Both Eyes QHS  . rosuvastatin  10 mg Oral Daily  . senna-docusate  2 tablet Oral QHS  . sodium phosphate  1 enema Rectal Once  . tamsulosin  0.4 mg Oral QHS  . zinc sulfate  220 mg Oral Daily   Continuous Infusions: . sodium chloride 10 mL/hr at 02/17/20 0500   PRN Meds:.acetaminophen, bisacodyl, chlorpheniramine-HYDROcodone, guaiFENesin-dextromethorphan, ondansetron **OR** ondansetron (ZOFRAN) IV, sodium phosphate   See all Orders from today for further details   Osvaldo Shipper M.D on 02/28/2020 at 9:24 AM  To page go to [email or web address] - use universal password  [facility] -  Office  [phone]    Objective:   Vitals:   02/27/20 1933 02/27/20 2326 02/28/20 0334 02/28/20 0733  BP: 134/73 (!) 105/56 (!) 94/53 110/66  Pulse: 100 74 82 66  Resp: 18 17 19 14   Temp: 98.1 F (36.7 C) 97.6 F (36.4 C) 97.7 F (36.5 C) 98.2 F (36.8 C)  TempSrc: Axillary Axillary Axillary Oral  SpO2: 91% 99% 90% 90%  Weight:      Height:        Wt Readings from Last 3 Encounters:  02/25/20 83.4 kg  02/09/20 83.9 kg  08/30/19 83 kg    No intake or output data in the 24 hours ending 02/28/20 0924   Physical Exam  General appearance: Awake alert.  In no distress.  Mildly distracted Resp: Normal effort at rest.  Coarse breath sounds with crackles bilateral bases.  No wheezing or rhonchi. Cardio: S1-S2 is normal regular.  No S3-S4.  No rubs murmurs or bruit GI:  Abdomen is soft.  Nontender nondistended.  Bowel sounds are present normal.  No masses organomegaly Extremities: Chronic edema bilateral lower extremities left more than right. Neurologic:  No focal neurological deficits.       Data Review:    CBC Recent Labs  Lab 02/23/20 0946 02/24/20 1256 02/25/20 0803 02/26/20 0500 02/27/20 0440  WBC 19.8* 15.3* 10.6* 8.4 11.1*  HGB 15.3 15.1 13.5 13.3 15.0  HCT 46.6 45.1 40.4 40.2 45.5  PLT 317 296 234 218 272  MCV 93.6 94.0 93.7 92.8 93.8  MCH 30.7 31.5 31.3 30.7 30.9  MCHC 32.8 33.5 33.4 33.1 33.0  RDW 13.2 13.1 13.1 12.8 12.9    Chemistries  Recent Labs  Lab 02/22/20 0516 02/22/20 0516 02/23/20 0946 02/23/20 0946 02/24/20 1256 02/25/20 0803 02/26/20 0500 02/27/20 0440 02/28/20 0500  NA 140   < > 137   < > 136 138 136 137 136  K 4.7   < >  4.3   < > 4.5 5.0 5.1 4.9 5.3*  CL 106   < > 104   < > 99 102 99 96* 99  CO2 28   < > 23   < > GLUCOSE 143*   < > 124*   < > 216* 148* 112* 148* 185*  BUN 36*   < > 36*   < > 29* 29* 32* 28* 28*  CREATININE 0.92   < > 0.95   < > 0.93 0.81 0.99 1.07 1.09  CALCIUM 7.9*   < > 8.1*   < > 8.0* 7.9* 8.0* 8.2* 8.1*  AST 30  --  54*  --  53* 30 34  --   --   ALT 74*  --  83*  --  94* 69* 72*  --   --   ALKPHOS 83  --  95  --  95 74 77  --   --   BILITOT 1.0  --  1.2  --  0.9 1.1 1.0  --   --    < > = values in this interval not displayed.     No results for input(s): DDIMER in the last 72 hours.      Component Value Date/Time   BNP 199.1 (H) 02/15/2020 1040    Micro Results No results found for this or any previous visit (from the past 240 hour(s)).  Radiology Reports CT Head Wo Contrast  Result Date: 02/09/2020 CLINICAL DATA:  Trauma, fall on blood thinners EXAM: CT HEAD WITHOUT CONTRAST CT CERVICAL SPINE WITHOUT CONTRAST TECHNIQUE: Multidetector CT imaging of the head and cervical spine was performed following the standard protocol without intravenous contrast.  Multiplanar CT image reconstructions of the cervical spine were also generated. COMPARISON:  None. FINDINGS: CT HEAD FINDINGS Brain: No acute territorial infarction, hemorrhage, or intracranial mass. Moderate atrophy. Mild hypodensity in the white matter consistent with chronic small vessel ischemic change. The ventricles are nonenlarged. Vascular: No hyperdense vessels.  Carotid vascular calcification. Skull: No depressed skull fracture. Sinuses/Orbits: Mucosal thickening in the maxillary and ethmoid sinuses. Small fluid level in the left sphenoid sinus but no central skull base lucency is seen. Possible chronic left nasal bone fracture. Other: Mild right posterior scalp swelling. CT CERVICAL SPINE FINDINGS Alignment: No subluxation.  Facet alignment within normal limits. Skull base and vertebrae: No acute fracture. No primary bone lesion or focal pathologic process. Soft tissues and spinal canal: No prevertebral fluid or swelling. No visible canal hematoma. Disc levels: Multiple level degenerative change with moderate severe disease at C5-C6 and C6-C7. Facet degenerative changes at multiple levels with foraminal narrowing. Upper chest: Apical scarring Other: None IMPRESSION: 1. No CT evidence for acute intracranial abnormality. Atrophy and mild chronic small vessel ischemic changes of the white matter. 2. Degenerative changes of the cervical spine. No acute osseous abnormality. Electronically Signed   By: Jasmine Pang M.D.   On: 02/09/2020 01:13   CT ANGIO CHEST PE W OR WO CONTRAST  Result Date: 02/16/2020 CLINICAL DATA:  COVID positive peer EXAM: CT ANGIOGRAPHY CHEST WITH CONTRAST TECHNIQUE: Multidetector CT imaging of the chest was performed using the standard protocol during bolus administration of intravenous contrast. Multiplanar CT image reconstructions and MIPs were obtained to evaluate the vascular anatomy. CONTRAST:  OMNIPAQUE IOHEXOL 350 MG/ML SOLN COMPARISON:  Oct 22, 2018 FINDINGS:  Cardiovascular: There is moderate severity calcification of the thoracic aorta. Satisfactory opacification of the pulmonary arteries to the  segmental level. No evidence of pulmonary embolism. Normal heart size. No pericardial effusion. Mediastinum/Nodes: No enlarged mediastinal, hilar, or axillary lymph nodes. Thyroid gland, trachea, and esophagus demonstrate no significant findings. Lungs/Pleura: Marked severity patchy and ground-glass appearing infiltrates are seen throughout both lungs. There is no evidence of a pleural effusion or pneumothorax. Upper Abdomen: Numerous subcentimeter gallstones are seen within the lumen of an otherwise normal-appearing gallbladder. Musculoskeletal: No chest wall abnormality. No acute or significant osseous findings. Review of the MIP images confirms the above findings. IMPRESSION: 1. Marked severity bilateral patchy and ground-glass appearing infiltrates. 2. Cholelithiasis. 3. Aortic atherosclerosis. Aortic Atherosclerosis (ICD10-I70.0). Electronically Signed   By: Aram Candela M.D.   On: 02/16/2020 22:13   CT Cervical Spine Wo Contrast  Result Date: 02/09/2020 CLINICAL DATA:  Trauma, fall on blood thinners EXAM: CT HEAD WITHOUT CONTRAST CT CERVICAL SPINE WITHOUT CONTRAST TECHNIQUE: Multidetector CT imaging of the head and cervical spine was performed following the standard protocol without intravenous contrast. Multiplanar CT image reconstructions of the cervical spine were also generated. COMPARISON:  None. FINDINGS: CT HEAD FINDINGS Brain: No acute territorial infarction, hemorrhage, or intracranial mass. Moderate atrophy. Mild hypodensity in the white matter consistent with chronic small vessel ischemic change. The ventricles are nonenlarged. Vascular: No hyperdense vessels.  Carotid vascular calcification. Skull: No depressed skull fracture. Sinuses/Orbits: Mucosal thickening in the maxillary and ethmoid sinuses. Small fluid level in the left sphenoid sinus but no  central skull base lucency is seen. Possible chronic left nasal bone fracture. Other: Mild right posterior scalp swelling. CT CERVICAL SPINE FINDINGS Alignment: No subluxation.  Facet alignment within normal limits. Skull base and vertebrae: No acute fracture. No primary bone lesion or focal pathologic process. Soft tissues and spinal canal: No prevertebral fluid or swelling. No visible canal hematoma. Disc levels: Multiple level degenerative change with moderate severe disease at C5-C6 and C6-C7. Facet degenerative changes at multiple levels with foraminal narrowing. Upper chest: Apical scarring Other: None IMPRESSION: 1. No CT evidence for acute intracranial abnormality. Atrophy and mild chronic small vessel ischemic changes of the white matter. 2. Degenerative changes of the cervical spine. No acute osseous abnormality. Electronically Signed   By: Jasmine Pang M.D.   On: 02/09/2020 01:13   DG Chest Port 1 View  Result Date: 02/15/2020 CLINICAL DATA:  COVID positive.  Hypoxia. EXAM: PORTABLE CHEST 1 VIEW COMPARISON:  Oct 22, 2018 FINDINGS: Diffuse bilateral interstitial and airspace opacities, new from prior. No visible pleural effusion. No pneumothorax. Cardiac silhouette is accentuated by low lung volumes and portable technique. Aortic atherosclerosis. IMPRESSION: Diffuse bilateral interstitial and airspace opacities, likely multifocal pneumonia given the clinical history. Electronically Signed   By: Feliberto Harts MD   On: 02/15/2020 10:56   DG Chest Port 1V same Day  Result Date: 02/24/2020 CLINICAL DATA:  COVID EXAM: PORTABLE CHEST 1 VIEW COMPARISON:  02/15/2020 FINDINGS: Bilateral patchy airspace opacities are again noted, similar prior study. Slight increase in lung volumes. Heart is normal size. No effusions or pneumothorax. No acute bony abnormality. IMPRESSION: Patchy bilateral airspace disease, not significantly changed since prior study. Slight increased lung volumes. Electronically Signed    By: Charlett Nose M.D.   On: 02/24/2020 08:31   VAS [place] LOWER EXTREMITY VENOUS (DVT)  Result Date: 02/16/2020  Lower Venous DVTStudy Indications: Swelling, and elevated d dimer.  Risk Factors: + covid. Performing Technologist: Jannet Askew RCT RDMS  Examination Guidelines: A complete evaluation includes B-mode imaging, spectral Doppler, color Doppler, and power Doppler as  needed of all accessible portions of each vessel. Bilateral testing is considered an integral part of a complete examination. Limited examinations for reoccurring indications may be performed as noted. The reflux portion of the exam is performed with the patient in reverse Trendelenburg.  +---------+---------------+---------+-----------+----------+--------------+ RIGHT    CompressibilityPhasicitySpontaneityPropertiesThrombus Aging +---------+---------------+---------+-----------+----------+--------------+ CFV      Full           Yes      Yes                                 +---------+---------------+---------+-----------+----------+--------------+ SFJ      Full                                                        +---------+---------------+---------+-----------+----------+--------------+ FV Prox  Full                                                        +---------+---------------+---------+-----------+----------+--------------+ FV Mid   Full                                                        +---------+---------------+---------+-----------+----------+--------------+ FV DistalFull                                                        +---------+---------------+---------+-----------+----------+--------------+ PFV      Full                                                        +---------+---------------+---------+-----------+----------+--------------+ POP      Full           Yes      Yes                                  +---------+---------------+---------+-----------+----------+--------------+ PTV      Full                                                        +---------+---------------+---------+-----------+----------+--------------+ PERO     Full                                                        +---------+---------------+---------+-----------+----------+--------------+   +---------+---------------+---------+-----------+----------+--------------+  LEFT     CompressibilityPhasicitySpontaneityPropertiesThrombus Aging +---------+---------------+---------+-----------+----------+--------------+ CFV      Full           Yes      Yes                                 +---------+---------------+---------+-----------+----------+--------------+ SFJ      Full                                                        +---------+---------------+---------+-----------+----------+--------------+ FV Prox  Full                                                        +---------+---------------+---------+-----------+----------+--------------+ FV Mid   Full                                                        +---------+---------------+---------+-----------+----------+--------------+ FV DistalFull                                                        +---------+---------------+---------+-----------+----------+--------------+ PFV      Full                                                        +---------+---------------+---------+-----------+----------+--------------+ POP      Full           Yes      Yes                                 +---------+---------------+---------+-----------+----------+--------------+ PTV      Full                                                        +---------+---------------+---------+-----------+----------+--------------+ PERO     Full                                                         +---------+---------------+---------+-----------+----------+--------------+     Summary: RIGHT: - There is no evidence of deep vein thrombosis in the lower extremity.  - No cystic structure found in the popliteal fossa.  LEFT: - There is no evidence of deep vein thrombosis in the lower extremity.  - No  cystic structure found in the popliteal fossa.  *See table(s) above for measurements and observations. Electronically signed by Coral Else MD on 02/16/2020 at 9:33:24 PM.    Final    ECHOCARDIOGRAM LIMITED  Result Date: 02/16/2020    ECHOCARDIOGRAM LIMITED REPORT   Patient Name:   SHIA EBER Date of Exam: 02/16/2020 Medical Rec #:  [id number]       Height:       72.0 in Accession #:    [id number]      Weight:       183.0 lb Date of Birth:  [birth year]       BSA:          2.052 m Patient Age:    84 years        BP:           114/53 mmHg Patient Gender: M               HR:           77 bpm. Exam Location:  Inpatient Procedure: Limited Echo, Cardiac Doppler and Color Doppler Indications:    Elevated Troponin  History:        Patient has prior history of Echocardiogram examinations, most                 recent 02/17/2019. CHF and Cardiomyopathy, CAD, Arrythmias:Atrial                 Fibrillation, Signs/Symptoms:Shortness of Breath and Fever; Risk                 Factors:Hypertension, Dyslipidemia, OBESITY and Diabetes.                 COVID+.  Sonographer:    Lavenia Atlas Referring Phys: [id number] Kasandra Knudsen PAHWANI IMPRESSIONS  1. Left ventricular ejection fraction, by estimation, is 55 to 60%.  2. Right ventricular systolic function is normal. The right ventricular size is normal. There is normal pulmonary artery systolic pressure.  3. The interatrial septum is aneuyrsmal with frequent bowing from right to left.  4. The mitral valve is normal in structure. Trivial mitral valve regurgitation.  5. There is mild calcification of the aortic valve. There is mild thickening of the aortic valve. Mild aortic valve  sclerosis is present, with no evidence of aortic valve stenosis. Comparison(s): Compared to prior echo on 01/2019, the LVEF appears mildly improved to 55-60%. FINDINGS  Left Ventricle: Septal motion is abnormal consistent with prior surgery. Left ventricular ejection fraction, by estimation, is 55 to 60%. The left ventricular internal cavity size was normal in size. There is no left ventricular hypertrophy. Right Ventricle: The right ventricular size is normal. Right ventricular systolic function is normal. There is normal pulmonary artery systolic pressure. The tricuspid regurgitant velocity is 2.41 m/s, and with an assumed right atrial pressure of 3 mmHg,  the estimated right ventricular systolic pressure is 26.2 mmHg. Left Atrium: Left atrial size was normal in size. Pericardium: There is no evidence of pericardial effusion. Mitral Valve: The mitral valve is normal in structure. There is mild thickening of the mitral valve leaflet(s). Mild mitral annular calcification. Trivial mitral valve regurgitation. Tricuspid Valve: The tricuspid valve is normal in structure. Tricuspid valve regurgitation is mild. Aortic Valve: There is mild calcification of the aortic valve. There is mild thickening of the aortic valve. Mild aortic valve sclerosis is present, with no evidence of aortic valve stenosis. Pulmonic Valve: The pulmonic valve was not assessed. Aorta:  The aortic root is normal in size and structure. LEFT VENTRICLE PLAX 2D LVIDd:         3.80 cm  Diastology LVIDs:         2.70 cm  LV e' medial:    7.51 cm/s LV PW:         1.20 cm  LV E/e' medial:  7.7 LV IVS:        0.90 cm  LV e' lateral:   5.98 cm/s LVOT diam:     1.90 cm  LV E/e' lateral: 9.7 LVOT Area:     2.84 cm  RIGHT VENTRICLE RV S prime:     13.30 cm/s LEFT ATRIUM         Index LA diam:    2.80 cm 1.36 cm/m   AORTA Ao Root diam: 2.50 cm MITRAL VALVE               TRICUSPID VALVE MV Area (PHT): 5.38 cm    TR Peak grad:   23.2 mmHg MV Decel Time: 141 msec     TR Vmax:        241.00 cm/s MV E velocity: 57.80 cm/s MV A velocity: 70.30 cm/s  SHUNTS MV E/A ratio:  0.82        Systemic Diam: 1.90 cm Laurance FlattenHeather Pemberton MD Electronically signed by Laurance FlattenHeather Pemberton MD Signature Date/Time: 02/16/2020/4:23:30 PM    Final    ABORTED INVASIVE LAB PROCEDURE  Result Date: 02/15/2020 This case was aborted.  US Abdomen Limited RUQ  Result Date: 02/15/2020 CLINICAL DATA:  Elevated liver enzymes. EXAM: ULTRASOUND ABDOMEN LIMITED RIGHT UPPER QUADRANT COMPARISON:  CT abdomen 07/06/2019. FINDINGS: Gallbladder: Gallstones measuring up to 7 mm noted. Gallbladder wall thickness normal. No Murphy sign. Common bile duct: Diameter: Not visualized due to overlying bowel gas. Liver: Left no not visualized due to overlying bowel gas. No focal lesion identified. Within normal limits in parenchymal echogenicity. Portal vein is patent on color Doppler imaging with normal direction of blood flow towards the liver. Other: Limited study due to overlying bowel gas. IMPRESSION: 1.  Multiple small gallstones.  No gallbladder wall thickening. 2. Limited exam due to overlying bowel gas. Common bile duct not visualized. Portions of the liver not visualized. Electronically Signed   By: Maisie Fushomas  Register   On: 02/15/2020 15:35

## 2020-02-28 NOTE — Plan of Care (Signed)

## 2020-02-29 DIAGNOSIS — J9601 Acute respiratory failure with hypoxia: Secondary | ICD-10-CM | POA: Diagnosis not present

## 2020-02-29 DIAGNOSIS — E119 Type 2 diabetes mellitus without complications: Secondary | ICD-10-CM | POA: Diagnosis not present

## 2020-02-29 DIAGNOSIS — U071 COVID-19: Secondary | ICD-10-CM | POA: Diagnosis not present

## 2020-02-29 DIAGNOSIS — E875 Hyperkalemia: Secondary | ICD-10-CM | POA: Diagnosis not present

## 2020-02-29 LAB — BASIC METABOLIC PANEL
Anion gap: 7 (ref 5–15)
BUN: 30 mg/dL — ABNORMAL HIGH (ref 8–23)
CO2: 30 mmol/L (ref 22–32)
Calcium: 7.9 mg/dL — ABNORMAL LOW (ref 8.9–10.3)
Chloride: 99 mmol/L (ref 98–111)
Creatinine, Ser: 0.89 mg/dL (ref 0.61–1.24)
GFR calc Af Amer: >60 mL/min (ref >60)
GFR calc non Af Amer: >60 mL/min (ref >60)
Glucose, Bld: 99 mg/dL (ref 70–99)
Potassium: 4.7 mmol/L (ref 3.5–5.1)
Sodium: 136 mmol/L (ref 135–145)

## 2020-02-29 LAB — GLUCOSE, CAPILLARY: Glucose-Capillary: 77 mg/dL (ref 70–99)

## 2020-02-29 MED ORDER — CHLORHEXIDINE GLUCONATE 0.12 % MT SOLN
15.0000 mL | Freq: Two times a day (BID) | OROMUCOSAL | Status: DC
  Administered 2020-02-29 – 2020-03-08 (×17): 15 mL via OROMUCOSAL
  Filled 2020-02-29 (×13): qty 15

## 2020-02-29 MED ORDER — ORAL CARE MOUTH RINSE
15.0000 mL | Freq: Two times a day (BID) | OROMUCOSAL | Status: DC
  Administered 2020-02-29 – 2020-03-08 (×17): 15 mL via OROMUCOSAL

## 2020-02-29 NOTE — Progress Notes (Signed)
PT Cancellation Note  Patient Details Name: Gordon Novak MRN: [id number] DOB: 1934/09/28   Cancelled Treatment:    Reason Eval/Treat Not Completed: (P) Other (comment) (Pt eating lunch and request PT come back later) PT will try to come back this afternoon if able.   Amaria Mundorf B. Beverely Risen PT, DPT [facility] Pager [phone] Office [phone]    Elon Alas Fleet 02/29/2020, 1:43 PM

## 2020-02-29 NOTE — Progress Notes (Addendum)
PROGRESS NOTE                                                                                                                                                                                                             Patient Demographics:    Gordon Novak, is a 85 y.o. male, DOB - 03/08/1935, GMW:[id number]  Outpatient Primary MD for the patient is [name], MD   Admit date - 02/15/2020   LOS - 14   Brief Narrative: Patient is a 85 y.o. male with PMHx of CAD s/p PCI 2020, DM-2, HLD, HTN, history of PE no longer on anticoagulation-presented with shortness of breath-found to have severe hypoxemia requiring high flow oxygen-due to COVID-19 pneumonia.  COVID-19 vaccinated status: Vaccinated  Significant Events: 9/21>> Admit to [facility] for hypoxia due to COVID-19 pneumonia  Significant studies: 9/21>>Chest x-ray: Diffuse lung infiltrates 9/22>> CTA chest: No PE-severe bilateral patchy/groundglass appearing infiltrates, cholelithiasis 9/22>> bilateral lower extremity Doppler: No DVT 9/22>> Echo: EF 55-60%, RV systolic function normal. 9/30>> chest x-ray: Patchy bilateral airspace disease-not significantly changed from prior.  COVID-19 medications: Steroids: 9/21>> Remdesivir: 9/21>> 9/25 Baricitinib: 9/21>>  Antibiotics: Rocephin: 9/22>> 9/26 Zithromax: 9/22>> 9/26  Microbiology data: 9/21 >>blood culture: No growth  Procedures: None  Consults: Cardiology  DVT prophylaxis: enoxaparin (LOVENOX) injection 40 mg at twice daily dosing (intermediate dosing) SCDs Start: 02/15/20 1300    Subjective:   Patient denies any complaints this morning.  Denies any chest pain.  Does admit to shortness of breath mainly with exertion but not at rest.  No nausea or vomiting.  No difficulty swallowing.   Assessment  & Plan :   Acute Hypoxic Resp Failure due to Covid 19 Viral pneumonia Patient has completed course of  Remdesivir.  Remains on steroids and baricitinib.  Has chronic left lower extremity edema.   For unclear reasons patient's oxygen requirement seems to have gone up in the last 24 to 48 hours.  He is back on HFNC and nonrebreather.  Saturating in the mid 90s.  Does new subcutaneous emphysema without any pneumothorax.  Stable opacities noted.  His last CT angiogram was on 01/2011 which did not show any PE.  Patient denies any chest pain.  His D-dimer was 1.7 on 10/1.  For now we will continue to monitor.  Continue Lasix.  Recheck his labs  tomorrow along with a chest x-ray.  May need to repeat a CT chest.  Elevated troponins Minimally elevated troponins with flat trend. Not consistent with ACS. Echo with preserved EF.  Cardiology evaluation completed. No recommendations apart from supportive care.    Minimally elevated D-dimer Has been trending downwards.  Has a history of PE in 2020 and completed 1 year treatment with Xarelto in May 2021.  CT angiogram chest and Doppler studies negative for VTE.  Continue intermediate dosing with Lovenox.  D-dimer had improved to 1.7.  Transaminitis Likely secondary to COVID-19.  Had been improving.  Recheck in the outpatient setting.  Chronic systolic heart failure/hyperkalemia Echocardiogram done during this hospitalization shows normal systolic function.  Strict ins and outs and daily weights.  Yesterday the dose of Lasix was decreased due to low blood pressure.  Blood pressure remains borderline low.  Potassium noted to be normal today.  He was given Lokelma on 10/4.     History of CAD Stable.  Seen by cardiology initially due to elevated troponin.  Currently without any chest pain.  Continue medical management.  On aspirin beta-blocker and statin.    DM-2 (A1c 7.1 on 9/21) Noted to have hypoglycemia on 9/30 and dose of Levemir was decreased.  CBGs stable.  Gout Continue allopurinol  BPH Continue Flomax  Chronic left lower extremity swelling At  baseline. Lower extremity Dopplers negative.  Constipation Patient keeps thinking that he has not had a bowel movement but nurses report multiple bowel movements.  Continue MiraLAX.  Cholelithiasis Incidentally noted on ultrasound of the abdomen.  Asymptomatic.  DVT prophylaxis: Lovenox Family Communication  :  Daughter [name] [phone]).  Updated yesterday.  Code Status : DNR  Diet :  Diet Order            Diet heart healthy/carb modified Room service appropriate? No; Fluid consistency: Thin  Diet effective now                  Disposition Plan  : Home health recommended by PT/OT  Status is: Inpatient  Remains inpatient appropriate because:Inpatient level of care appropriate due to severity of illness   Dispo: The patient is from: Home              Anticipated d/c is to: Home              Anticipated d/c date is: > 3 days              Patient currently is not medically stable to d/c.  Barriers to discharge: Continues to have high oxygen requirements  Antimicorbials  :    Anti-infectives (From admission, onward)   Start     Dose/Rate Route Frequency Ordered Stop   02/16/20 1330  azithromycin (ZITHROMAX) 500 mg in sodium chloride 0.9 % 250 mL IVPB        500 mg 250 mL/hr over 60 Minutes Intravenous Every 24 hours 02/16/20 1237 02/20/20 1320   02/16/20 1300  cefTRIAXone (ROCEPHIN) 2 g in sodium chloride 0.9 % 100 mL IVPB        2 g 200 mL/hr over 30 Minutes Intravenous Every 24 hours 02/16/20 1237 02/20/20 1252   02/16/20 1000  remdesivir 100 mg in sodium chloride 0.9 % 100 mL IVPB       "Followed by" Linked Group Details   100 mg 200 mL/hr over 30 Minutes Intravenous Daily 02/15/20 1301 02/19/20 0855   02/15/20 1400  remdesivir 200 mg in sodium  chloride 0.9% 250 mL IVPB       "Followed by" Linked Group Details   200 mg 580 mL/hr over 30 Minutes Intravenous Once 02/15/20 1301 02/15/20 1504      Inpatient Medications  Scheduled Meds: . allopurinol  300 mg  Oral Daily  . vitamin C  500 mg Oral Daily  . aspirin EC  81 mg Oral Daily  . chlorhexidine  15 mL Mouth Rinse BID  . enoxaparin (LOVENOX) injection  40 mg Subcutaneous BID  . furosemide  20 mg Oral Daily  . insulin aspart  0-15 Units Subcutaneous TID WC  . insulin aspart  0-5 Units Subcutaneous QHS  . insulin detemir  10 Units Subcutaneous Daily  . linagliptin  5 mg Oral Daily  . mouth rinse  15 mL Mouth Rinse q12n4p  . methylPREDNISolone (SOLU-MEDROL) injection  40 mg Intravenous Q12H  . mometasone-formoterol  2 puff Inhalation BID  . pantoprazole  40 mg Oral Daily  . polyethylene glycol  17 g Oral BID  . prednisoLONE acetate  1 drop Both Eyes QHS  . rosuvastatin  10 mg Oral Daily  . senna-docusate  2 tablet Oral QHS  . tamsulosin  0.4 mg Oral QHS  . zinc sulfate  220 mg Oral Daily   Continuous Infusions: . sodium chloride 10 mL/hr at 02/17/20 0500   PRN Meds:.acetaminophen, bisacodyl, chlorpheniramine-HYDROcodone, guaiFENesin-dextromethorphan, ondansetron **OR** ondansetron (ZOFRAN) IV, sodium phosphate   See all Orders from today for further details   Osvaldo Shipper M.D on 02/29/2020 at 11:51 AM  To page go to [email or web address] - use universal password  [facility] -  Office  [phone]    Objective:   Vitals:   02/28/20 2049 02/28/20 2324 02/29/20 0400 02/29/20 0801  BP:  (!) 110/58 (!) 90/55 (!) 102/52  Pulse: 76 81 68 68  Resp: 19 19 20 20   Temp:  98.6 F (37 C) 98.2 F (36.8 C) 98.6 F (37 C)  TempSrc:  Oral Axillary Oral  SpO2:  95% 98% 97%  Weight:   79.1 kg   Height:        Wt Readings from Last 3 Encounters:  02/29/20 79.1 kg  02/09/20 83.9 kg  08/30/19 83 kg     Intake/Output Summary (Last 24 hours) at 02/29/2020 1151 Last data filed at 02/29/2020 0543 Gross per 24 hour  Intake 360 ml  Output 125 ml  Net 235 ml     Physical Exam  General appearance: Awake alert.  In no distress.  Mildly distracted Resp: Mildly tachypneic at rest.   Normal use of accessory muscles.  Coarse breath sounds with crackles bilateral bases.  No wheezing or rhonchi. Cardio: S1-S2 is normal regular.  No S3-S4.  No rubs murmurs or bruit GI: Abdomen is soft.  Nontender nondistended.  Bowel sounds are present normal.  No masses organomegaly Extremities: Chronic edema bilateral lower extremities left greater than right. Neurologic: No focal neurological deficits.        Data Review:    CBC Recent Labs  Lab 02/23/20 0946 02/24/20 1256 02/25/20 0803 02/26/20 0500 02/27/20 0440  WBC 19.8* 15.3* 10.6* 8.4 11.1*  HGB 15.3 15.1 13.5 13.3 15.0  HCT 46.6 45.1 40.4 40.2 45.5  PLT 317 296 234 218 272  MCV 93.6 94.0 93.7 92.8 93.8  MCH 30.7 31.5 31.3 30.7 30.9  MCHC 32.8 33.5 33.4 33.1 33.0  RDW 13.2 13.1 13.1 12.8 12.9    Chemistries  Recent Labs  Lab 02/23/20 0946 02/23/20 0946  02/24/20 1256 02/24/20 1256 02/25/20 0803 02/26/20 0500 02/27/20 0440 02/28/20 0500 02/29/20 0222  NA 137   < > 136   < > 138 136 137 136 136  K 4.3   < > 4.5   < > 5.0 5.1 4.9 5.3* 4.7  CL 104   < > 99   < > 102 99 96* 99 99  CO2 23   < > 27   < > GLUCOSE 124*   < > 216*   < > 148* 112* 148* 185* 99  BUN 36*   < > 29*   < > 29* 32* 28* 28* 30*  CREATININE 0.95   < > 0.93   < > 0.81 0.99 1.07 1.09 0.89  CALCIUM 8.1*   < > 8.0*   < > 7.9* 8.0* 8.2* 8.1* 7.9*  AST 54*  --  53*  --  30 34  --   --   --   ALT 83*  --  94*  --  69* 72*  --   --   --   ALKPHOS 95  --  95  --  74 77  --   --   --   BILITOT 1.2  --  0.9  --  1.1 1.0  --   --   --    < > = values in this interval not displayed.     No results for input(s): DDIMER in the last 72 hours.      Component Value Date/Time   BNP 199.1 (H) 02/15/2020 1040    Micro Results No results found for this or any previous visit (from the past 240 hour(s)).  Radiology Reports CT Head Wo Contrast  Result Date: 02/09/2020 CLINICAL DATA:  Trauma, fall on blood thinners EXAM: CT HEAD  WITHOUT CONTRAST CT CERVICAL SPINE WITHOUT CONTRAST TECHNIQUE: Multidetector CT imaging of the head and cervical spine was performed following the standard protocol without intravenous contrast. Multiplanar CT image reconstructions of the cervical spine were also generated. COMPARISON:  None. FINDINGS: CT HEAD FINDINGS Brain: No acute territorial infarction, hemorrhage, or intracranial mass. Moderate atrophy. Mild hypodensity in the white matter consistent with chronic small vessel ischemic change. The ventricles are nonenlarged. Vascular: No hyperdense vessels.  Carotid vascular calcification. Skull: No depressed skull fracture. Sinuses/Orbits: Mucosal thickening in the maxillary and ethmoid sinuses. Small fluid level in the left sphenoid sinus but no central skull base lucency is seen. Possible chronic left nasal bone fracture. Other: Mild right posterior scalp swelling. CT CERVICAL SPINE FINDINGS Alignment: No subluxation.  Facet alignment within normal limits. Skull base and vertebrae: No acute fracture. No primary bone lesion or focal pathologic process. Soft tissues and spinal canal: No prevertebral fluid or swelling. No visible canal hematoma. Disc levels: Multiple level degenerative change with moderate severe disease at C5-C6 and C6-C7. Facet degenerative changes at multiple levels with foraminal narrowing. Upper chest: Apical scarring Other: None IMPRESSION: 1. No CT evidence for acute intracranial abnormality. Atrophy and mild chronic small vessel ischemic changes of the white matter. 2. Degenerative changes of the cervical spine. No acute osseous abnormality. Electronically Signed   By: Jasmine Pang M.D.   On: 02/09/2020 01:13   CT ANGIO CHEST PE W OR WO CONTRAST  Result Date: 02/16/2020 CLINICAL DATA:  COVID positive peer EXAM: CT ANGIOGRAPHY CHEST WITH CONTRAST TECHNIQUE: Multidetector CT imaging of the chest was performed using the standard protocol during bolus administration of intravenous  contrast. Multiplanar CT image reconstructions and MIPs were obtained to evaluate the vascular anatomy. CONTRAST:  OMNIPAQUE IOHEXOL 350 MG/ML SOLN COMPARISON:  Oct 22, 2018 FINDINGS: Cardiovascular: There is moderate severity calcification of the thoracic aorta. Satisfactory opacification of the pulmonary arteries to the segmental level. No evidence of pulmonary embolism. Normal heart size. No pericardial effusion. Mediastinum/Nodes: No enlarged mediastinal, hilar, or axillary lymph nodes. Thyroid gland, trachea, and esophagus demonstrate no significant findings. Lungs/Pleura: Marked severity patchy and ground-glass appearing infiltrates are seen throughout both lungs. There is no evidence of a pleural effusion or pneumothorax. Upper Abdomen: Numerous subcentimeter gallstones are seen within the lumen of an otherwise normal-appearing gallbladder. Musculoskeletal: No chest wall abnormality. No acute or significant osseous findings. Review of the MIP images confirms the above findings. IMPRESSION: 1. Marked severity bilateral patchy and ground-glass appearing infiltrates. 2. Cholelithiasis. 3. Aortic atherosclerosis. Aortic Atherosclerosis (ICD10-I70.0). Electronically Signed   By: Aram Candela M.D.   On: 02/16/2020 22:13   CT Cervical Spine Wo Contrast  Result Date: 02/09/2020 CLINICAL DATA:  Trauma, fall on blood thinners EXAM: CT HEAD WITHOUT CONTRAST CT CERVICAL SPINE WITHOUT CONTRAST TECHNIQUE: Multidetector CT imaging of the head and cervical spine was performed following the standard protocol without intravenous contrast. Multiplanar CT image reconstructions of the cervical spine were also generated. COMPARISON:  None. FINDINGS: CT HEAD FINDINGS Brain: No acute territorial infarction, hemorrhage, or intracranial mass. Moderate atrophy. Mild hypodensity in the white matter consistent with chronic small vessel ischemic change. The ventricles are nonenlarged. Vascular: No hyperdense vessels.   Carotid vascular calcification. Skull: No depressed skull fracture. Sinuses/Orbits: Mucosal thickening in the maxillary and ethmoid sinuses. Small fluid level in the left sphenoid sinus but no central skull base lucency is seen. Possible chronic left nasal bone fracture. Other: Mild right posterior scalp swelling. CT CERVICAL SPINE FINDINGS Alignment: No subluxation.  Facet alignment within normal limits. Skull base and vertebrae: No acute fracture. No primary bone lesion or focal pathologic process. Soft tissues and spinal canal: No prevertebral fluid or swelling. No visible canal hematoma. Disc levels: Multiple level degenerative change with moderate severe disease at C5-C6 and C6-C7. Facet degenerative changes at multiple levels with foraminal narrowing. Upper chest: Apical scarring Other: None IMPRESSION: 1. No CT evidence for acute intracranial abnormality. Atrophy and mild chronic small vessel ischemic changes of the white matter. 2. Degenerative changes of the cervical spine. No acute osseous abnormality. Electronically Signed   By: Jasmine Pang M.D.   On: 02/09/2020 01:13   DG CHEST PORT 1 VIEW  Result Date: 02/28/2020 CLINICAL DATA:  Respiratory failure, COVID-19 positive, chest tightness EXAM: PORTABLE CHEST 1 VIEW COMPARISON:  02/24/2020 FINDINGS: Stable cardiomediastinal contours. Atherosclerotic calcification of the aortic knob. Subtle airspace opacities bilaterally with a predominantly peripheral and basilar distribution, not appreciably changed from prior. No pleural effusion. No evidence of pneumothorax. Interval development of subcutaneous emphysema most pronounced at the base of the right neck and within the right axillary region. IMPRESSION: 1. Interval development of subcutaneous emphysema at the base of the right neck and within the right axillary region. No evidence of pneumothorax. Correlate with physical exam and for history of attempted central line placement. 2. Stable bilateral  peripheral and basilar airspace opacities. Electronically Signed   By: Duanne Guess D.O.   On: 02/28/2020 13:07   DG Chest Port 1 View  Result Date: 02/15/2020 CLINICAL DATA:  COVID positive.  Hypoxia. EXAM: PORTABLE CHEST 1 VIEW COMPARISON:  Oct 22, 2018 FINDINGS: Diffuse bilateral  interstitial and airspace opacities, new from prior. No visible pleural effusion. No pneumothorax. Cardiac silhouette is accentuated by low lung volumes and portable technique. Aortic atherosclerosis. IMPRESSION: Diffuse bilateral interstitial and airspace opacities, likely multifocal pneumonia given the clinical history. Electronically Signed   By: Feliberto HartsFrederick S Jones MD   On: 02/15/2020 10:56   DG Chest Port 1V same Day  Result Date: 02/24/2020 CLINICAL DATA:  COVID EXAM: PORTABLE CHEST 1 VIEW COMPARISON:  02/15/2020 FINDINGS: Bilateral patchy airspace opacities are again noted, similar prior study. Slight increase in lung volumes. Heart is normal size. No effusions or pneumothorax. No acute bony abnormality. IMPRESSION: Patchy bilateral airspace disease, not significantly changed since prior study. Slight increased lung volumes. Electronically Signed   By: Charlett NoseKevin  Dover M.D.   On: 02/24/2020 08:31   VAS US LOWER EXTREMITY VENOUS (DVT)  Result Date: 02/16/2020  Lower Venous DVTStudy Indications: Swelling, and elevated d dimer.  Risk Factors: + covid. Performing Technologist: Jannet AskewVernon Matacale RCT RDMS  Examination Guidelines: A complete evaluation includes B-mode imaging, spectral Doppler, color Doppler, and power Doppler as needed of all accessible portions of each vessel. Bilateral testing is considered an integral part of a complete examination. Limited examinations for reoccurring indications may be performed as noted. The reflux portion of the exam is performed with the patient in reverse Trendelenburg.  +---------+---------------+---------+-----------+----------+--------------+ RIGHT     CompressibilityPhasicitySpontaneityPropertiesThrombus Aging +---------+---------------+---------+-----------+----------+--------------+ CFV      Full           Yes      Yes                                 +---------+---------------+---------+-----------+----------+--------------+ SFJ      Full                                                        +---------+---------------+---------+-----------+----------+--------------+ FV Prox  Full                                                        +---------+---------------+---------+-----------+----------+--------------+ FV Mid   Full                                                        +---------+---------------+---------+-----------+----------+--------------+ FV DistalFull                                                        +---------+---------------+---------+-----------+----------+--------------+ PFV      Full                                                        +---------+---------------+---------+-----------+----------+--------------+  POP      Full           Yes      Yes                                 +---------+---------------+---------+-----------+----------+--------------+ PTV      Full                                                        +---------+---------------+---------+-----------+----------+--------------+ PERO     Full                                                        +---------+---------------+---------+-----------+----------+--------------+   +---------+---------------+---------+-----------+----------+--------------+ LEFT     CompressibilityPhasicitySpontaneityPropertiesThrombus Aging +---------+---------------+---------+-----------+----------+--------------+ CFV      Full           Yes      Yes                                 +---------+---------------+---------+-----------+----------+--------------+ SFJ      Full                                                         +---------+---------------+---------+-----------+----------+--------------+ FV Prox  Full                                                        +---------+---------------+---------+-----------+----------+--------------+ FV Mid   Full                                                        +---------+---------------+---------+-----------+----------+--------------+ FV DistalFull                                                        +---------+---------------+---------+-----------+----------+--------------+ PFV      Full                                                        +---------+---------------+---------+-----------+----------+--------------+ POP      Full           Yes      Yes                                 +---------+---------------+---------+-----------+----------+--------------+  PTV      Full                                                        +---------+---------------+---------+-----------+----------+--------------+ PERO     Full                                                        +---------+---------------+---------+-----------+----------+--------------+     Summary: RIGHT: - There is no evidence of deep vein thrombosis in the lower extremity.  - No cystic structure found in the popliteal fossa.  LEFT: - There is no evidence of deep vein thrombosis in the lower extremity.  - No cystic structure found in the popliteal fossa.  *See table(s) above for measurements and observations. Electronically signed by Coral Else MD on 02/16/2020 at 9:33:24 PM.    Final    ECHOCARDIOGRAM LIMITED  Result Date: 02/16/2020    ECHOCARDIOGRAM LIMITED REPORT   Patient Name:   MIKLO AKEN Date of Exam: 02/16/2020 Medical Rec #:  [id number]       Height:       72.0 in Accession #:    [id number]      Weight:       183.0 lb Date of Birth:  [birth year]       BSA:          2.052 m Patient Age:    84 years        BP:           114/53 mmHg Patient  Gender: M               HR:           77 bpm. Exam Location:  Inpatient Procedure: Limited Echo, Cardiac Doppler and Color Doppler Indications:    Elevated Troponin  History:        Patient has prior history of Echocardiogram examinations, most                 recent 02/17/2019. CHF and Cardiomyopathy, CAD, Arrythmias:Atrial                 Fibrillation, Signs/Symptoms:Shortness of Breath and Fever; Risk                 Factors:Hypertension, Dyslipidemia, OBESITY and Diabetes.                 COVID+.  Sonographer:    Lavenia Atlas Referring Phys: [id number] Kasandra Knudsen PAHWANI IMPRESSIONS  1. Left ventricular ejection fraction, by estimation, is 55 to 60%.  2. Right ventricular systolic function is normal. The right ventricular size is normal. There is normal pulmonary artery systolic pressure.  3. The interatrial septum is aneuyrsmal with frequent bowing from right to left.  4. The mitral valve is normal in structure. Trivial mitral valve regurgitation.  5. There is mild calcification of the aortic valve. There is mild thickening of the aortic valve. Mild aortic valve sclerosis is present, with no evidence of aortic valve stenosis. Comparison(s): Compared to prior echo on 01/2019, the LVEF appears mildly improved to 55-60%. FINDINGS  Left Ventricle: Septal motion is abnormal consistent with prior  surgery. Left ventricular ejection fraction, by estimation, is 55 to 60%. The left ventricular internal cavity size was normal in size. There is no left ventricular hypertrophy. Right Ventricle: The right ventricular size is normal. Right ventricular systolic function is normal. There is normal pulmonary artery systolic pressure. The tricuspid regurgitant velocity is 2.41 m/s, and with an assumed right atrial pressure of 3 mmHg,  the estimated right ventricular systolic pressure is 26.2 mmHg. Left Atrium: Left atrial size was normal in size. Pericardium: There is no evidence of pericardial effusion. Mitral Valve: The mitral  valve is normal in structure. There is mild thickening of the mitral valve leaflet(s). Mild mitral annular calcification. Trivial mitral valve regurgitation. Tricuspid Valve: The tricuspid valve is normal in structure. Tricuspid valve regurgitation is mild. Aortic Valve: There is mild calcification of the aortic valve. There is mild thickening of the aortic valve. Mild aortic valve sclerosis is present, with no evidence of aortic valve stenosis. Pulmonic Valve: The pulmonic valve was not assessed. Aorta: The aortic root is normal in size and structure. LEFT VENTRICLE PLAX 2D LVIDd:         3.80 cm  Diastology LVIDs:         2.70 cm  LV e' medial:    7.51 cm/s LV PW:         1.20 cm  LV E/e' medial:  7.7 LV IVS:        0.90 cm  LV e' lateral:   5.98 cm/s LVOT diam:     1.90 cm  LV E/e' lateral: 9.7 LVOT Area:     2.84 cm  RIGHT VENTRICLE RV S prime:     13.30 cm/s LEFT ATRIUM         Index LA diam:    2.80 cm 1.36 cm/m   AORTA Ao Root diam: 2.50 cm MITRAL VALVE               TRICUSPID VALVE MV Area (PHT): 5.38 cm    TR Peak grad:   23.2 mmHg MV Decel Time: 141 msec    TR Vmax:        241.00 cm/s MV E velocity: 57.80 cm/s MV A velocity: 70.30 cm/s  SHUNTS MV E/A ratio:  0.82        Systemic Diam: 1.90 cm Laurance Flatten MD Electronically signed by Laurance Flatten MD Signature Date/Time: 02/16/2020/4:23:30 PM    Final    ABORTED INVASIVE LAB PROCEDURE  Result Date: 02/15/2020 This case was aborted.  US Abdomen Limited RUQ  Result Date: 02/15/2020 CLINICAL DATA:  Elevated liver enzymes. EXAM: ULTRASOUND ABDOMEN LIMITED RIGHT UPPER QUADRANT COMPARISON:  CT abdomen 07/06/2019. FINDINGS: Gallbladder: Gallstones measuring up to 7 mm noted. Gallbladder wall thickness normal. No Murphy sign. Common bile duct: Diameter: Not visualized due to overlying bowel gas. Liver: Left no not visualized due to overlying bowel gas. No focal lesion identified. Within normal limits in parenchymal echogenicity. Portal vein is  patent on color Doppler imaging with normal direction of blood flow towards the liver. Other: Limited study due to overlying bowel gas. IMPRESSION: 1.  Multiple small gallstones.  No gallbladder wall thickening. 2. Limited exam due to overlying bowel gas. Common bile duct not visualized. Portions of the liver not visualized. Electronically Signed   By: Maisie Fus  Register   On: 02/15/2020 15:35

## 2020-02-29 NOTE — Evaluation (Signed)
Clinical/Bedside Swallow Evaluation Patient Details  Name: Gordon Novak MRN: [id number] Date of Birth: 1934-09-12  Today's Date: 02/29/2020 Time: SLP Start Time (ACUTE ONLY): 0948 SLP Stop Time (ACUTE ONLY): 0958 SLP Time Calculation (min) (ACUTE ONLY): 10 min  Past Medical History:  Past Medical History:  Diagnosis Date  . Acute systolic heart failure (HCC)    35-40%  . Bladder outlet obstruction   . Blindness of left eye   . CAD S/P percutaneous coronary angioplasty 10/22/2018   Cath-PCA 10/18/2018: 100% mRCA -- (DES PCI-Synergy 3 mm x 28 mm - 3.3 mm).  Otherwise normal coronaries  . Chronic Left leg swelling    a. Since 2007.  Marland Kitchen Essential hypertension   . Gout   . Hemorrhoids 05/12/2014  . Ischemic cardiomyopathy--EF now resolved to 50 to 55%. 10/30/2018   Post inferior STEMI: TTE 10/18/2018: EF 35-40%, diffuse hypokinesis.  GR 1 DD --> resolved 4 months post MI. -Residual inferior and inferolateral and inferoseptal hypokinesis.  . Nephrolithiasis   . PE (pulmonary thromboembolism) (HCC) 10/22/2018  . ST elevation myocardial infarction (STEMI) of inferior wall (HCC) 10/18/2018    PCI/DES x1 to the mRCA  . Type 2 diabetes mellitus (HCC)    Past Surgical History:  Past Surgical History:  Procedure Laterality Date  . CATARACT EXTRACTION    . CORONARY/GRAFT ACUTE MI REVASCULARIZATION N/A 10/18/2018   Procedure: Coronary/Graft Acute MI Revascularization - Cornonary Stent Intervention;  Surgeon: Runell Gess, MD;  Location: [facility];;  100% mRCA -- (DES PCI-Synergy 3 mm x 28 mm - 3.3 mm).    . CYSTOSCOPY W/ URETERAL STENT PLACEMENT Left 05/07/2014   Procedure: CYSTOSCOPY WITH RETROGRADE PYELOGRAM/ LEFT URETERAL STENT PLACEMENT;  Surgeon: Crist Fat, MD;  Location: [facility];  Service: Urology;  Laterality: Left;  . CYSTOSCOPY WITH RETROGRADE PYELOGRAM, URETEROSCOPY AND STENT PLACEMENT Left 05/14/2014   Procedure: LEFT URETEROSCOPY AND STENT EXCHANGE,STONE  BASKETRY EXTRACTION;  Surgeon: Jerilee Field, MD;  Location: [facility];  Service: Urology;  Laterality: Left;  . HOLMIUM LASER APPLICATION Left 05/14/2014   Procedure: HOLMIUM LASER APPLICATION;  Surgeon: Jerilee Field, MD;  Location: [facility];  Service: Urology;  Laterality: Left;  . INTRAOCULAR LENS INSERTION Right   . LEFT HEART CATH AND CORONARY ANGIOGRAPHY N/A 10/18/2018   Procedure: LEFT HEART CATH AND CORONARY ANGIOGRAPHY;  Surgeon: Runell Gess, MD;  Location: [facility];;  100% mRCA -- (DES PCI)..  Otherwise normal coronaries  . TRANSTHORACIC ECHOCARDIOGRAM  02/17/2019   EF 50 to 55%.  Residual basal-mid inferolateral and apical lateral as well as basal septal hypokinesis.  Normal RV size and function.  Relatively normal atrial size.  Moderate aortic sclerosis/calcification but no stenosis.  Bowed interatrial septum suggesting elevated RAP.  Mildly elevated PAP.--Significant improvement -> prior EF (initially 35 to 40%) in setting of Inf-lat STEMI May 2020   HPI:  Pt is an 85 y.o. male who tested (+) COVID-19 on 02/07/20, admitted 02/15/20 with hypoxia; workup for acute hypoxemic respiratory failure due to COVID-19 PNA with severe lung parenchymal injury. PMH includes CAD, HTN, HLD, DM2, PE; pt fully vaccinated. CXR Stable bilateral peripheral and basilar airspace opacities.   Assessment / Plan / Recommendation Clinical Impression  Swallow assessment ordered due to increased O2 requirements in past 24 hours. He has not noticed any change or difficulty with swallowing. Volitional cough is mildly weak and tongue is discolored and candidias noted on upper and hard palate (ordered adult oral care protocol).  Respiratory effort did not increase during straw consumption of 2-3 oz water. Oral phase was unremarkable with solid texture. Per subjective evaluation it appeared he is protecting his airway without decompensation of swallow. Continue regular texture, thin liquids, pills with thin.  No further ST needed however if he should decline and dysphagia is a concern, please reconsult.         SLP Visit Diagnosis: Dysphagia, unspecified (R13.10)    Aspiration Risk  Mild aspiration risk    Diet Recommendation Regular;Thin liquid   Liquid Administration via: Cup;Straw Medication Administration: Whole meds with liquid Supervision: Patient able to self feed Compensations: Slow rate;Small sips/bites Postural Changes: Seated upright at 90 degrees    Other  Recommendations Oral Care Recommendations: Oral care BID   Follow up Recommendations None      Frequency and Duration            Prognosis        Swallow Study   General HPI: Pt is an 85 y.o. male who tested (+) COVID-19 on 02/07/20, admitted 02/15/20 with hypoxia; workup for acute hypoxemic respiratory failure due to COVID-19 PNA with severe lung parenchymal injury. PMH includes CAD, HTN, HLD, DM2, PE; pt fully vaccinated. CXR Stable bilateral peripheral and basilar airspace opacities. Type of Study: Bedside Swallow Evaluation Previous Swallow Assessment:  (none) Diet Prior to this Study: Regular;Thin liquids Temperature Spikes Noted: No Respiratory Status:  (HFNC 15L) History of Recent Intubation: No Behavior/Cognition: Alert;Pleasant mood;Cooperative Oral Cavity Assessment: Other (comment) (candidias hard/soft palate, lingual discoloration ) Oral Care Completed by SLP: No Oral Cavity - Dentition: Adequate natural dentition Vision: Functional for self-feeding Self-Feeding Abilities: Able to feed self Patient Positioning: Upright in chair Baseline Vocal Quality: Normal Volitional Cough: Weak Volitional Swallow: Able to elicit    Oral/Motor/Sensory Function Overall Oral Motor/Sensory Function: Within functional limits   Ice Chips Ice chips: Not tested   Thin Liquid Thin Liquid: Within functional limits Presentation: Straw;Cup    Nectar Thick Nectar Thick Liquid: Not tested   Honey Thick Honey Thick Liquid:  Not tested   Puree Puree: Not tested   Solid     Solid: Within functional limits      Royce Macadamia 02/29/2020,10:21 AM  Breck Coons Lonell Face.Ed Nurse, children's [phone] Office [phone]

## 2020-02-29 NOTE — Progress Notes (Signed)
Physical Therapy Treatment Patient Details Name: Gordon Novak MRN: [id number] DOB: 06-May-1935 Today's Date: 02/29/2020    History of Present Illness Pt is an 85 y.o. male who tested (+) COVID-19 on 02/07/20, now admitted 02/15/20 with hypoxia; workup for acute hypoxemic respiratory failure due to COVID-19 PNA with severe lung parenchymal injury. PMH includes CAD, HTN, HLD, DM2, PE; pt fully vaccinated.    PT Comments    Focus of session obtaining orthostatic vitals and working on progress tolerance of standing. Pt is min guard for sit > stand and was able to accomplish 30 sec, 64 sec, and then 3 bouts of 30 sec.  Pt orthostatic vitals below. Pt reports 4/4 SoB as main complaint with standing. Pt educated on building standing endurance. D/c plans remain appropriate. PT will continue to follow acutely.  Orthostatic BPs                                                              BP                  HR Sitting 105/55 108  Standing 88/41 123  Standing after 1 min 30 sec, obtained in seated  92/41 135       Follow Up Recommendations  Home health PT;Supervision for mobility/OOB     Equipment Recommendations  Rolling walker with 5" wheels;3in1 (PT)       Precautions / Restrictions Precautions Precautions: Fall;Other (comment) Precaution Comments: Quick to desaturate  Restrictions Weight Bearing Restrictions: No    Mobility  Bed Mobility               General bed mobility comments: sitting up in recliner on entry  Transfers Overall transfer level: Needs assistance Equipment used: Rolling walker (2 wheeled);None Transfers: Sit to/from Stand Sit to Stand: Min guard         General transfer comment: min guard for power up into standing. Worked on standing for 30 sec bouts       Balance Overall balance assessment: Needs assistance Sitting-balance support: No upper extremity supported;Feet supported Sitting balance-Leahy Scale: Fair     Standing balance  support: During functional activity;No upper extremity supported;Single extremity supported Standing balance-Leahy Scale: Fair                              Cognition Arousal/Alertness: Awake/alert Behavior During Therapy: WFL for tasks assessed/performed Overall Cognitive Status: Within Functional Limits for tasks assessed                                 General Comments: WFL for simple tasks, following all commands. Having to repeat at times due to [facility]      Exercises Other Exercises Other Exercises: 5x sit>stand with 30 sec standing     General Comments General comments (skin integrity, edema, etc.): Pt on 15L HFNC, SaO2 at rest 96%O2 with standing SaO2 dropped to 92%O2, and pt placed 15L NRB for rest of session due to SoB with activity pt with orthostatic response to standing, max HR 136bpm with standing       Pertinent Vitals/Pain Pain Assessment: No/denies pain  PT Goals (current goals can now be found in the care plan section) Acute Rehab PT Goals Patient Stated Goal: "I want to get back home as soon as I can... I hope our traveling days have not come to an end" PT Goal Formulation: With patient Time For Goal Achievement: 03/08/20 Potential to Achieve Goals: Fair Progress towards PT goals: Progressing toward goals (limited by BP drop and SoB)    Frequency    Min 3X/week      PT Plan Current plan remains appropriate       AM-PAC PT "6 Clicks" Mobility   Outcome Measure  Help needed turning from your back to your side while in a flat bed without using bedrails?: None Help needed moving from lying on your back to sitting on the side of a flat bed without using bedrails?: None Help needed moving to and from a bed to a chair (including a wheelchair)?: A Little Help needed standing up from a chair using your arms (e.g., wheelchair or bedside chair)?: A Little Help needed to walk in hospital room?: A Little Help needed climbing  3-5 steps with a railing? : A Lot 6 Click Score: 19    End of Session Equipment Utilized During Treatment: Oxygen Activity Tolerance: Patient tolerated treatment well;Treatment limited secondary to medical complications (Comment) Patient left: in chair;with call bell/phone within reach Nurse Communication: Mobility status PT Visit Diagnosis: Other abnormalities of gait and mobility (R26.89)     Time: 8295-6213 PT Time Calculation (min) (ACUTE ONLY): 37 min  Charges:  $Therapeutic Exercise: 8-22 mins $Therapeutic Activity: 8-22 mins                     Mollee Neer B. Beverely Risen PT, DPT Acute Rehabilitation Services Pager [phone] Office [phone]    Elon Alas Fleet 02/29/2020, 4:40 PM

## 2020-02-29 NOTE — Progress Notes (Signed)
Inpatient Diabetes Program Recommendations  AACE/ADA: New Consensus Statement on Inpatient Glycemic Control (2015)  Target Ranges:  Prepandial:   less than 140 mg/dL      Peak postprandial:   less than 180 mg/dL (1-2 hours)      Critically ill patients:  140 - 180 mg/dL   Lab Results  Component Value Date   GLUCAP 77 02/29/2020   HGBA1C 7.1 (H) 02/15/2020    Review of Glycemic Control  Inpatient Diabetes Program Recommendations:   Consider : -Decrease Lantus to 8 units qd -Decrease Novolog correction to sensitive tid  Thank you, Billy Fischer. Fannye Myer, RN, MSN, CDE  Diabetes Coordinator Inpatient Glycemic Control Team Team Pager [phone] (8am-5pm) 02/29/2020 1:38 PM

## 2020-03-01 ENCOUNTER — Inpatient Hospital Stay (HOSPITAL_COMMUNITY): Payer: Medicare Other

## 2020-03-01 DIAGNOSIS — E875 Hyperkalemia: Secondary | ICD-10-CM | POA: Diagnosis not present

## 2020-03-01 DIAGNOSIS — E119 Type 2 diabetes mellitus without complications: Secondary | ICD-10-CM | POA: Diagnosis not present

## 2020-03-01 DIAGNOSIS — J9601 Acute respiratory failure with hypoxia: Secondary | ICD-10-CM | POA: Diagnosis not present

## 2020-03-01 DIAGNOSIS — U071 COVID-19: Secondary | ICD-10-CM | POA: Diagnosis not present

## 2020-03-01 LAB — BASIC METABOLIC PANEL
Anion gap: 9 (ref 5–15)
BUN: 26 mg/dL — ABNORMAL HIGH (ref 8–23)
CO2: 31 mmol/L (ref 22–32)
Calcium: 7.9 mg/dL — ABNORMAL LOW (ref 8.9–10.3)
Chloride: 99 mmol/L (ref 98–111)
Creatinine, Ser: 0.93 mg/dL (ref 0.61–1.24)
GFR calc non Af Amer: >60 mL/min (ref >60)
Glucose, Bld: 88 mg/dL (ref 70–99)
Potassium: 4.1 mmol/L (ref 3.5–5.1)
Sodium: 139 mmol/L (ref 135–145)

## 2020-03-01 LAB — GLUCOSE, CAPILLARY: Glucose-Capillary: 118 mg/dL — ABNORMAL HIGH (ref 70–99)

## 2020-03-01 LAB — D-DIMER, QUANTITATIVE: D-Dimer, Quant: 0.85 ug/mL-FEU — ABNORMAL HIGH (ref 0.00–0.50)

## 2020-03-01 LAB — CBC
HCT: 39.5 % (ref 39.0–52.0)
Hemoglobin: 13.2 g/dL (ref 13.0–17.0)
MCH: 31.1 pg (ref 26.0–34.0)
MCHC: 33.4 g/dL (ref 30.0–36.0)
MCV: 93.2 fL (ref 80.0–100.0)
Platelets: 174 10*3/uL (ref 150–400)
RBC: 4.24 MIL/uL (ref 4.22–5.81)
RDW: 13.1 % (ref 11.5–15.5)
WBC: 10.4 10*3/uL (ref 4.0–10.5)
nRBC: 0 % (ref 0.0–0.2)

## 2020-03-01 MED ORDER — IOHEXOL 300 MG/ML  SOLN
75.0000 mL | Freq: Once | INTRAMUSCULAR | Status: AC | PRN
  Administered 2020-03-01: 65 mL via INTRAVENOUS

## 2020-03-01 MED ORDER — INSULIN DETEMIR 100 UNIT/ML ~~LOC~~ SOLN
8.0000 [IU] | Freq: Every day | SUBCUTANEOUS | Status: DC
  Administered 2020-03-01 – 2020-03-02 (×2): 8 [IU] via SUBCUTANEOUS
  Filled 2020-03-01 (×2): qty 0.08

## 2020-03-01 NOTE — Progress Notes (Signed)
PROGRESS NOTE                                                                                                                                                                                                             Patient Demographics:    Gordon Novak, is a 85 y.o. male, DOB - 11/12/1934, WUJ:[id number]  Outpatient Primary MD for the patient is Gordon Banner, MD   Admit date - 02/15/2020   LOS - 15   Brief Narrative: Patient is a 85 y.o. male with PMHx of CAD s/p PCI 2020, DM-2, HLD, HTN, history of PE no longer on anticoagulation-presented with shortness of breath-found to have severe hypoxemia requiring high flow oxygen-due to COVID-19 pneumonia.  COVID-19 vaccinated status: Vaccinated  Significant Events: 9/21>> Admit to [facility] for hypoxia due to COVID-19 pneumonia  Significant studies: 9/21>>Chest x-ray: Diffuse lung infiltrates 9/22>> CTA chest: No PE-severe bilateral patchy/groundglass appearing infiltrates, cholelithiasis 9/22>> bilateral lower extremity Doppler: No DVT 9/22>> Echo: EF 55-60%, RV systolic function normal. 9/30>> chest x-ray: Patchy bilateral airspace disease-not significantly changed from prior.  COVID-19 medications: Steroids: 9/21>> Remdesivir: 9/21>> 9/25 Baricitinib: 9/21>>  Antibiotics: Rocephin: 9/22>> 9/26 Zithromax: 9/22>> 9/26  Microbiology data: 9/21 >>blood culture: No growth  Procedures: None  Consults: Cardiology  DVT prophylaxis: enoxaparin (LOVENOX) injection 40 mg at twice daily dosing (intermediate dosing) SCDs Start: 02/15/20 1300    Subjective:   Patient continues to have difficulty breathing.  Slightly better today compared to yesterday.  No chest pain.  No nausea vomiting.  Occasional cough.     Assessment  & Plan :   Acute Hypoxic Resp Failure due to Covid 19 Viral pneumonia Patient has completed course of Remdesivir.  Remains on steroids and  baricitinib.  Has chronic left lower extremity edema.   Patient's oxygen requirements have climbed in the last 48 hours.  Although it is slightly better today compared to yesterday.  He is currently on 10 L high flow nasal cannula.  Chest x-ray does show subcutaneous emphysema.  No obvious pneumomediastinum is noted.  Concern raised for a small apical pneumothorax.  We will proceed with CT chest for clearer picture.  His last CT angiogram was on 01/2011 which did not show any PE.  D-dimer noted to be 0.85.  Continue oral Lasix.    Elevated troponins Minimally elevated troponins  with flat trend. Not consistent with ACS. Echo with preserved EF.  Cardiology evaluation completed. No recommendations apart from supportive care.    Minimally elevated D-dimer Has been trending downwards.  Has a history of PE in 2020 and completed 1 year treatment with Xarelto in May 2021.  CT angiogram chest and Doppler studies negative for VTE.  Continue intermediate dosing with Lovenox.  D-dimer has been improving.  Transaminitis Likely secondary to COVID-19.  Had been improving.  Recheck in the outpatient setting.  Chronic systolic heart failure/hyperkalemia Echocardiogram done during this hospitalization shows normal systolic function.  Strict ins and outs and daily weights.  Continue furosemide at a lower dose.  Dose was decreased due to low blood pressures.  Potassium is 4.1 today.  Was given Lokelma on 2 days ago.   History of CAD Stable.  Seen by cardiology initially due to elevated troponin.  Currently without any chest pain.  Continue medical management.  On aspirin beta-blocker and statin.    DM-2 (A1c 7.1 on 9/21) Noted to have hypoglycemia on 9/30 and dose of Levemir was decreased.  CBGs stable for the most part.  Gout Continue allopurinol  BPH Continue Flomax  Chronic left lower extremity swelling At baseline. Lower extremity Dopplers negative.  Constipation Patient keeps thinking that he has not  had a bowel movement but nurses report multiple bowel movements.  Continue MiraLAX.  Cholelithiasis Incidentally noted on ultrasound of the abdomen.  Asymptomatic.  DVT prophylaxis: Lovenox Family Communication  :  Daughter [name] [phone]).  Will update today.  Code Status : DNR  Diet :  Diet Order            Diet heart healthy/carb modified Room service appropriate? No; Fluid consistency: Thin  Diet effective now                  Disposition Plan  : Home health recommended by PT/OT  Status is: Inpatient  Remains inpatient appropriate because:Inpatient level of care appropriate due to severity of illness   Dispo: The patient is from: Home              Anticipated d/c is to: Home              Anticipated d/c date is: > 3 days              Patient currently is not medically stable to d/c.  Barriers to discharge: Continues to have high oxygen requirements  Antimicorbials  :    Anti-infectives (From admission, onward)   Start     Dose/Rate Route Frequency Ordered Stop   02/16/20 1330  azithromycin (ZITHROMAX) 500 mg in sodium chloride 0.9 % 250 mL IVPB        500 mg 250 mL/hr over 60 Minutes Intravenous Every 24 hours 02/16/20 1237 02/20/20 1320   02/16/20 1300  cefTRIAXone (ROCEPHIN) 2 g in sodium chloride 0.9 % 100 mL IVPB        2 g 200 mL/hr over 30 Minutes Intravenous Every 24 hours 02/16/20 1237 02/20/20 1252   02/16/20 1000  remdesivir 100 mg in sodium chloride 0.9 % 100 mL IVPB       "Followed by" Linked Group Details   100 mg 200 mL/hr over 30 Minutes Intravenous Daily 02/15/20 1301 02/19/20 0855   02/15/20 1400  remdesivir 200 mg in sodium chloride 0.9% 250 mL IVPB       "Followed by" Linked Group Details   200 mg 580 mL/hr  over 30 Minutes Intravenous Once 02/15/20 1301 02/15/20 1504      Inpatient Medications  Scheduled Meds: . allopurinol  300 mg Oral Daily  . vitamin C  500 mg Oral Daily  . aspirin EC  81 mg Oral Daily  . chlorhexidine  15 mL  Mouth Rinse BID  . enoxaparin (LOVENOX) injection  40 mg Subcutaneous BID  . furosemide  20 mg Oral Daily  . insulin aspart  0-15 Units Subcutaneous TID WC  . insulin detemir  10 Units Subcutaneous Daily  . mouth rinse  15 mL Mouth Rinse q12n4p  . methylPREDNISolone (SOLU-MEDROL) injection  40 mg Intravenous Q12H  . mometasone-formoterol  2 puff Inhalation BID  . pantoprazole  40 mg Oral Daily  . polyethylene glycol  17 g Oral BID  . prednisoLONE acetate  1 drop Both Eyes QHS  . rosuvastatin  10 mg Oral Daily  . senna-docusate  2 tablet Oral QHS  . tamsulosin  0.4 mg Oral QHS  . zinc sulfate  220 mg Oral Daily   Continuous Infusions: . sodium chloride 10 mL/hr at 02/17/20 0500   PRN Meds:.acetaminophen, bisacodyl, chlorpheniramine-HYDROcodone, guaiFENesin-dextromethorphan, ondansetron **OR** ondansetron (ZOFRAN) IV, sodium phosphate   See all Orders from today for further details   Osvaldo Shipper M.D on 03/01/2020 at 9:25 AM  To page go to [email or web address] - use universal password  [facility] -  Office  [phone]    Objective:   Vitals:   02/29/20 2022 02/29/20 2359 03/01/20 0403 03/01/20 0731  BP: 117/80 (!) 93/55 (!) 110/57 (!) 109/57  Pulse: 100 84 82   Resp: 18 18 19 16   Temp: 97.6 F (36.4 C) 97.9 F (36.6 C) 98.4 F (36.9 C) 98.7 F (37.1 C)  TempSrc: Oral Oral Oral Oral  SpO2: 99% 100% 100%   Weight:   75.3 kg   Height:        Wt Readings from Last 3 Encounters:  03/01/20 75.3 kg  02/09/20 83.9 kg  08/30/19 83 kg     Intake/Output Summary (Last 24 hours) at 03/01/2020 0925 Last data filed at 03/01/2020 0735 Gross per 24 hour  Intake --  Output 250 ml  Net -250 ml     Physical Exam  General appearance: Awake alert.  In no distress Resp: Tachypneic.  No use of accessory muscles.  Coarse breath sounds with crackles bilateral bases.  No wheezing or rhonchi. Cardio: S1-S2 is normal regular.  No S3-S4.  No rubs murmurs or bruit GI: Abdomen  is soft.  Nontender nondistended.  Bowel sounds are present normal.  No masses organomegaly Extremities: Continues to have edema bilateral lower extremities left greater than right.  Slightly better compared to before. Neurologic:  No focal neurological deficits.        Data Review:    CBC Recent Labs  Lab 02/24/20 1256 02/25/20 0803 02/26/20 0500 02/27/20 0440 03/01/20 0726  WBC 15.3* 10.6* 8.4 11.1* 10.4  HGB 15.1 13.5 13.3 15.0 13.2  HCT 45.1 40.4 40.2 45.5 39.5  PLT 296 234 218 272 174  MCV 94.0 93.7 92.8 93.8 93.2  MCH 31.5 31.3 30.7 30.9 31.1  MCHC 33.5 33.4 33.1 33.0 33.4  RDW 13.1 13.1 12.8 12.9 13.1    Chemistries  Recent Labs  Lab 02/23/20 0946 02/23/20 0946 02/24/20 1256 02/24/20 1256 02/25/20 0803 02/25/20 0803 02/26/20 0500 02/27/20 0440 02/28/20 0500 02/29/20 0222 03/01/20 0726  NA 137   < > 136   < > 138   < >  136 137 136 136 139  K 4.3   < > 4.5   < > 5.0   < > 5.1 4.9 5.3* 4.7 4.1  CL 104   < > 99   < > 102   < > 99 96* 99 99 99  CO2 23   < > 27   < > 29   < > 30 30 30 30 31   GLUCOSE 124*   < > 216*   < > 148*   < > 112* 148* 185* 99 88  BUN 36*   < > 29*   < > 29*   < > 32* 28* 28* 30* 26*  CREATININE 0.95   < > 0.93   < > 0.81   < > 0.99 1.07 1.09 0.89 0.93  CALCIUM 8.1*   < > 8.0*   < > 7.9*   < > 8.0* 8.2* 8.1* 7.9* 7.9*  AST 54*  --  53*  --  30  --  34  --   --   --   --   ALT 83*  --  94*  --  69*  --  72*  --   --   --   --   ALKPHOS 95  --  95  --  74  --  77  --   --   --   --   BILITOT 1.2  --  0.9  --  1.1  --  1.0  --   --   --   --    < > = values in this interval not displayed.     Recent Labs    03/01/20 0726  DDIMER 0.85*        Component Value Date/Time   BNP 199.1 (H) 02/15/2020 1040    Micro Results No results found for this or any previous visit (from the past 240 hour(s)).  Radiology Reports CT Head Wo Contrast  Result Date: 02/09/2020 CLINICAL DATA:  Trauma, fall on blood thinners EXAM: CT HEAD  WITHOUT CONTRAST CT CERVICAL SPINE WITHOUT CONTRAST TECHNIQUE: Multidetector CT imaging of the head and cervical spine was performed following the standard protocol without intravenous contrast. Multiplanar CT image reconstructions of the cervical spine were also generated. COMPARISON:  None. FINDINGS: CT HEAD FINDINGS Brain: No acute territorial infarction, hemorrhage, or intracranial mass. Moderate atrophy. Mild hypodensity in the white matter consistent with chronic small vessel ischemic change. The ventricles are nonenlarged. Vascular: No hyperdense vessels.  Carotid vascular calcification. Skull: No depressed skull fracture. Sinuses/Orbits: Mucosal thickening in the maxillary and ethmoid sinuses. Small fluid level in the left sphenoid sinus but no central skull base lucency is seen. Possible chronic left nasal bone fracture. Other: Mild right posterior scalp swelling. CT CERVICAL SPINE FINDINGS Alignment: No subluxation.  Facet alignment within normal limits. Skull base and vertebrae: No acute fracture. No primary bone lesion or focal pathologic process. Soft tissues and spinal canal: No prevertebral fluid or swelling. No visible canal hematoma. Disc levels: Multiple level degenerative change with moderate severe disease at C5-C6 and C6-C7. Facet degenerative changes at multiple levels with foraminal narrowing. Upper chest: Apical scarring Other: None IMPRESSION: 1. No CT evidence for acute intracranial abnormality. Atrophy and mild chronic small vessel ischemic changes of the white matter. 2. Degenerative changes of the cervical spine. No acute osseous abnormality. Electronically Signed   By: 02/11/2020 M.D.   On: 02/09/2020 01:13   CT ANGIO CHEST PE W OR WO CONTRAST  Result Date: 02/16/2020 CLINICAL DATA:  COVID positive peer EXAM: CT ANGIOGRAPHY CHEST WITH CONTRAST TECHNIQUE: Multidetector CT imaging of the chest was performed using the standard protocol during bolus administration of intravenous  contrast. Multiplanar CT image reconstructions and MIPs were obtained to evaluate the vascular anatomy. CONTRAST:  OMNIPAQUE IOHEXOL 350 MG/ML SOLN COMPARISON:  Oct 22, 2018 FINDINGS: Cardiovascular: There is moderate severity calcification of the thoracic aorta. Satisfactory opacification of the pulmonary arteries to the segmental level. No evidence of pulmonary embolism. Normal heart size. No pericardial effusion. Mediastinum/Nodes: No enlarged mediastinal, hilar, or axillary lymph nodes. Thyroid gland, trachea, and esophagus demonstrate no significant findings. Lungs/Pleura: Marked severity patchy and ground-glass appearing infiltrates are seen throughout both lungs. There is no evidence of a pleural effusion or pneumothorax. Upper Abdomen: Numerous subcentimeter gallstones are seen within the lumen of an otherwise normal-appearing gallbladder. Musculoskeletal: No chest wall abnormality. No acute or significant osseous findings. Review of the MIP images confirms the above findings. IMPRESSION: 1. Marked severity bilateral patchy and ground-glass appearing infiltrates. 2. Cholelithiasis. 3. Aortic atherosclerosis. Aortic Atherosclerosis (ICD10-I70.0). Electronically Signed   By: Aram Candela M.D.   On: 02/16/2020 22:13   CT Cervical Spine Wo Contrast  Result Date: 02/09/2020 CLINICAL DATA:  Trauma, fall on blood thinners EXAM: CT HEAD WITHOUT CONTRAST CT CERVICAL SPINE WITHOUT CONTRAST TECHNIQUE: Multidetector CT imaging of the head and cervical spine was performed following the standard protocol without intravenous contrast. Multiplanar CT image reconstructions of the cervical spine were also generated. COMPARISON:  None. FINDINGS: CT HEAD FINDINGS Brain: No acute territorial infarction, hemorrhage, or intracranial mass. Moderate atrophy. Mild hypodensity in the white matter consistent with chronic small vessel ischemic change. The ventricles are nonenlarged. Vascular: No hyperdense vessels.   Carotid vascular calcification. Skull: No depressed skull fracture. Sinuses/Orbits: Mucosal thickening in the maxillary and ethmoid sinuses. Small fluid level in the left sphenoid sinus but no central skull base lucency is seen. Possible chronic left nasal bone fracture. Other: Mild right posterior scalp swelling. CT CERVICAL SPINE FINDINGS Alignment: No subluxation.  Facet alignment within normal limits. Skull base and vertebrae: No acute fracture. No primary bone lesion or focal pathologic process. Soft tissues and spinal canal: No prevertebral fluid or swelling. No visible canal hematoma. Disc levels: Multiple level degenerative change with moderate severe disease at C5-C6 and C6-C7. Facet degenerative changes at multiple levels with foraminal narrowing. Upper chest: Apical scarring Other: None IMPRESSION: 1. No CT evidence for acute intracranial abnormality. Atrophy and mild chronic small vessel ischemic changes of the white matter. 2. Degenerative changes of the cervical spine. No acute osseous abnormality. Electronically Signed   By: Jasmine Pang M.D.   On: 02/09/2020 01:13   DG Chest Port 1 View  Result Date: 03/01/2020 CLINICAL DATA:  Pneumonia due to COVID-19 EXAM: PORTABLE CHEST 1 VIEW COMPARISON:  Two days ago FINDINGS: Greater chest wall emphysema. Pneumomediastinum. There is biapical extrapleural gas versus trace pneumothorax, unchanged. Bilateral pulmonary infiltrate. Normal heart size. IMPRESSION: 1. Progressive chest wall emphysema. 2. Small biapical pneumothorax versus extrapleural gas, interval stability favoring the latter. 3. Stable infiltrates. Electronically Signed   By: Marnee Spring M.D.   On: 03/01/2020 07:13   DG CHEST PORT 1 VIEW  Result Date: 02/28/2020 CLINICAL DATA:  Respiratory failure, COVID-19 positive, chest tightness EXAM: PORTABLE CHEST 1 VIEW COMPARISON:  02/24/2020 FINDINGS: Stable cardiomediastinal contours. Atherosclerotic calcification of the aortic knob. Subtle  airspace opacities bilaterally with a predominantly peripheral and basilar distribution, not appreciably changed  from prior. No pleural effusion. No evidence of pneumothorax. Interval development of subcutaneous emphysema most pronounced at the base of the right neck and within the right axillary region. IMPRESSION: 1. Interval development of subcutaneous emphysema at the base of the right neck and within the right axillary region. No evidence of pneumothorax. Correlate with physical exam and for history of attempted central line placement. 2. Stable bilateral peripheral and basilar airspace opacities. Electronically Signed   By: Duanne Guess D.O.   On: 02/28/2020 13:07   DG Chest Port 1 View  Result Date: 02/15/2020 CLINICAL DATA:  COVID positive.  Hypoxia. EXAM: PORTABLE CHEST 1 VIEW COMPARISON:  Oct 22, 2018 FINDINGS: Diffuse bilateral interstitial and airspace opacities, new from prior. No visible pleural effusion. No pneumothorax. Cardiac silhouette is accentuated by low lung volumes and portable technique. Aortic atherosclerosis. IMPRESSION: Diffuse bilateral interstitial and airspace opacities, likely multifocal pneumonia given the clinical history. Electronically Signed   By: Feliberto Harts MD   On: 02/15/2020 10:56   DG Chest Port 1V same Day  Result Date: 02/24/2020 CLINICAL DATA:  COVID EXAM: PORTABLE CHEST 1 VIEW COMPARISON:  02/15/2020 FINDINGS: Bilateral patchy airspace opacities are again noted, similar prior study. Slight increase in lung volumes. Heart is normal size. No effusions or pneumothorax. No acute bony abnormality. IMPRESSION: Patchy bilateral airspace disease, not significantly changed since prior study. Slight increased lung volumes. Electronically Signed   By: Charlett Nose M.D.   On: 02/24/2020 08:31   VAS [place] LOWER EXTREMITY VENOUS (DVT)  Result Date: 02/16/2020  Lower Venous DVTStudy Indications: Swelling, and elevated d dimer.  Risk Factors: + covid. Performing  Technologist: Jannet Askew RCT RDMS  Examination Guidelines: A complete evaluation includes B-mode imaging, spectral Doppler, color Doppler, and power Doppler as needed of all accessible portions of each vessel. Bilateral testing is considered an integral part of a complete examination. Limited examinations for reoccurring indications may be performed as noted. The reflux portion of the exam is performed with the patient in reverse Trendelenburg.  +---------+---------------+---------+-----------+----------+--------------+ RIGHT    CompressibilityPhasicitySpontaneityPropertiesThrombus Aging +---------+---------------+---------+-----------+----------+--------------+ CFV      Full           Yes      Yes                                 +---------+---------------+---------+-----------+----------+--------------+ SFJ      Full                                                        +---------+---------------+---------+-----------+----------+--------------+ FV Prox  Full                                                        +---------+---------------+---------+-----------+----------+--------------+ FV Mid   Full                                                        +---------+---------------+---------+-----------+----------+--------------+ FV DistalFull                                                        +---------+---------------+---------+-----------+----------+--------------+  PFV      Full                                                        +---------+---------------+---------+-----------+----------+--------------+ POP      Full           Yes      Yes                                 +---------+---------------+---------+-----------+----------+--------------+ PTV      Full                                                        +---------+---------------+---------+-----------+----------+--------------+ PERO     Full                                                         +---------+---------------+---------+-----------+----------+--------------+   +---------+---------------+---------+-----------+----------+--------------+ LEFT     CompressibilityPhasicitySpontaneityPropertiesThrombus Aging +---------+---------------+---------+-----------+----------+--------------+ CFV      Full           Yes      Yes                                 +---------+---------------+---------+-----------+----------+--------------+ SFJ      Full                                                        +---------+---------------+---------+-----------+----------+--------------+ FV Prox  Full                                                        +---------+---------------+---------+-----------+----------+--------------+ FV Mid   Full                                                        +---------+---------------+---------+-----------+----------+--------------+ FV DistalFull                                                        +---------+---------------+---------+-----------+----------+--------------+ PFV      Full                                                        +---------+---------------+---------+-----------+----------+--------------+  POP      Full           Yes      Yes                                 +---------+---------------+---------+-----------+----------+--------------+ PTV      Full                                                        +---------+---------------+---------+-----------+----------+--------------+ PERO     Full                                                        +---------+---------------+---------+-----------+----------+--------------+     Summary: RIGHT: - There is no evidence of deep vein thrombosis in the lower extremity.  - No cystic structure found in the popliteal fossa.  LEFT: - There is no evidence of deep vein thrombosis in the lower extremity.  - No cystic structure found  in the popliteal fossa.  *See table(s) above for measurements and observations. Electronically signed by Coral ElseVance Brabham MD on 02/16/2020 at 9:33:24 PM.    Final    ECHOCARDIOGRAM LIMITED  Result Date: 02/16/2020    ECHOCARDIOGRAM LIMITED REPORT   Patient Name:   Ronaldo MiyamotoOMMY C Krisher Date of Exam: 02/16/2020 Medical Rec #:  [id number]       Height:       72.0 in Accession #:    [id number]      Weight:       183.0 lb Date of Birth:  11/20/1934       BSA:          2.052 m Patient Age:    84 years        BP:           114/53 mmHg Patient Gender: M               HR:           77 bpm. Exam Location:  Inpatient Procedure: Limited Echo, Cardiac Doppler and Color Doppler Indications:    Elevated Troponin  History:        Patient has prior history of Echocardiogram examinations, most                 recent 02/17/2019. CHF and Cardiomyopathy, CAD, Arrythmias:Atrial                 Fibrillation, Signs/Symptoms:Shortness of Breath and Fever; Risk                 Factors:Hypertension, Dyslipidemia, OBESITY and Diabetes.                 COVID+.  Sonographer:    Lavenia AtlasBrooke Strickland Referring Phys: [id number] Kasandra KnudsenINKA R PAHWANI IMPRESSIONS  1. Left ventricular ejection fraction, by estimation, is 55 to 60%.  2. Right ventricular systolic function is normal. The right ventricular size is normal. There is normal pulmonary artery systolic pressure.  3. The interatrial septum is aneuyrsmal with frequent bowing from right to left.  4. The mitral valve is normal in structure. Trivial mitral valve  regurgitation.  5. There is mild calcification of the aortic valve. There is mild thickening of the aortic valve. Mild aortic valve sclerosis is present, with no evidence of aortic valve stenosis. Comparison(s): Compared to prior echo on 01/2019, the LVEF appears mildly improved to 55-60%. FINDINGS  Left Ventricle: Septal motion is abnormal consistent with prior surgery. Left ventricular ejection fraction, by estimation, is 55 to 60%. The left ventricular  internal cavity size was normal in size. There is no left ventricular hypertrophy. Right Ventricle: The right ventricular size is normal. Right ventricular systolic function is normal. There is normal pulmonary artery systolic pressure. The tricuspid regurgitant velocity is 2.41 m/s, and with an assumed right atrial pressure of 3 mmHg,  the estimated right ventricular systolic pressure is 26.2 mmHg. Left Atrium: Left atrial size was normal in size. Pericardium: There is no evidence of pericardial effusion. Mitral Valve: The mitral valve is normal in structure. There is mild thickening of the mitral valve leaflet(s). Mild mitral annular calcification. Trivial mitral valve regurgitation. Tricuspid Valve: The tricuspid valve is normal in structure. Tricuspid valve regurgitation is mild. Aortic Valve: There is mild calcification of the aortic valve. There is mild thickening of the aortic valve. Mild aortic valve sclerosis is present, with no evidence of aortic valve stenosis. Pulmonic Valve: The pulmonic valve was not assessed. Aorta: The aortic root is normal in size and structure. LEFT VENTRICLE PLAX 2D LVIDd:         3.80 cm  Diastology LVIDs:         2.70 cm  LV e' medial:    7.51 cm/s LV PW:         1.20 cm  LV E/e' medial:  7.7 LV IVS:        0.90 cm  LV e' lateral:   5.98 cm/s LVOT diam:     1.90 cm  LV E/e' lateral: 9.7 LVOT Area:     2.84 cm  RIGHT VENTRICLE RV S prime:     13.30 cm/s LEFT ATRIUM         Index LA diam:    2.80 cm 1.36 cm/m   AORTA Ao Root diam: 2.50 cm MITRAL VALVE               TRICUSPID VALVE MV Area (PHT): 5.38 cm    TR Peak grad:   23.2 mmHg MV Decel Time: 141 msec    TR Vmax:        241.00 cm/s MV E velocity: 57.80 cm/s MV A velocity: 70.30 cm/s  SHUNTS MV E/A ratio:  0.82        Systemic Diam: 1.90 cm Laurance Flatten MD Electronically signed by Laurance Flatten MD Signature Date/Time: 02/16/2020/4:23:30 PM    Final    ABORTED INVASIVE LAB PROCEDURE  Result Date: 02/15/2020 This  case was aborted.  US Abdomen Limited RUQ  Result Date: 02/15/2020 CLINICAL DATA:  Elevated liver enzymes. EXAM: ULTRASOUND ABDOMEN LIMITED RIGHT UPPER QUADRANT COMPARISON:  CT abdomen 07/06/2019. FINDINGS: Gallbladder: Gallstones measuring up to 7 mm noted. Gallbladder wall thickness normal. No Murphy sign. Common bile duct: Diameter: Not visualized due to overlying bowel gas. Liver: Left no not visualized due to overlying bowel gas. No focal lesion identified. Within normal limits in parenchymal echogenicity. Portal vein is patent on color Doppler imaging with normal direction of blood flow towards the liver. Other: Limited study due to overlying bowel gas. IMPRESSION: 1.  Multiple small gallstones.  No gallbladder wall thickening. 2. Limited exam  due to overlying bowel gas. Common bile duct not visualized. Portions of the liver not visualized. Electronically Signed   By: Maisie Fus  Register   On: 02/15/2020 15:35

## 2020-03-01 NOTE — Progress Notes (Signed)
Occupational Therapy Treatment Patient Details Name: Gordon Novak MRN: [id number] DOB: [birth year] Today's Date: 03/01/2020    History of present illness Pt is an 85 y.o. male who tested (+) COVID-19 on 02/07/20, now admitted 02/15/20 with hypoxia; workup for acute hypoxemic respiratory failure due to COVID-19 PNA with severe lung parenchymal injury. PMH includes CAD, HTN, HLD, DM2, PE; pt fully vaccinated.   OT comments  Pt seen for OT follow up session with focus on energy conservation training and activity tolerance progression. Continuing to make good progress toward OT goals. Issued pt level one theraband program as described below. With UE exercise, pt SpO2 as low as 86%- quickly improved with rest and cues for pursed lip breathing. Pt then engaged in standing marching in place for 1 min each x2 trials. SpO2 stable during this time. Pt able to tolerate entirety of session on 10L HFNC. D/c recs remain appropriate, will continue to follow.    Follow Up Recommendations  Home health OT;Supervision/Assistance - 24 hour    Equipment Recommendations  3 in 1 bedside commode    Recommendations for Other Services      Precautions / Restrictions Precautions Precautions: Fall Restrictions Weight Bearing Restrictions: No       Mobility Bed Mobility               General bed mobility comments: up in recliner, returned to recliner  Transfers Overall transfer level: Needs assistance Equipment used: None Transfers: Sit to/from Stand Sit to Stand: Min guard;Min assist         General transfer comment: min guard- min A to maintain balance    Balance Overall balance assessment: Needs assistance Sitting-balance support: No upper extremity supported;Feet supported Sitting balance-Leahy Scale: Good     Standing balance support: During functional activity;No upper extremity supported;Single extremity supported Standing balance-Leahy Scale: Poor Standing balance comment:  intermittent min A needed to maintain balance with mobility progression                           ADL either performed or assessed with clinical judgement   ADL Overall ADL's : Needs assistance/impaired                             Toileting- Clothing Manipulation and Hygiene: Set up Toileting - Clothing Manipulation Details (indicate cue type and reason): pt able to manage urinal with set up assist     Functional mobility during ADLs: Minimal assistance General ADL Comments: Session focused on energy conservation training, introduction of level one theraband program, and mobility progression     Vision       Perception     Praxis      Cognition Arousal/Alertness: Awake/alert Behavior During Therapy: WFL for tasks assessed/performed Overall Cognitive Status: Within Functional Limits for tasks assessed                                          Exercises Other Exercises Other Exercises: Marching in place: ~1 min each x2 bouts Other Exercises: level one theraband: bicep curls; shoulder press; shoulder abduction; shoulder diagonals x10 each Other Exercises: IS x10 (good technique) pulling 750-1045mL   Shoulder Instructions       General Comments      Pertinent Vitals/ Pain       Pain  Assessment: No/denies pain  Home Living                                          Prior Functioning/Environment              Frequency  Min 2X/week        Progress Toward Goals  OT Goals(current goals can now be found in the care plan section)  Progress towards OT goals: Progressing toward goals  Acute Rehab OT Goals Patient Stated Goal: "I want to get back home as soon as I can... I hope our traveling days have not come to an end" OT Goal Formulation: With patient Time For Goal Achievement: 03/08/20 Potential to Achieve Goals: Good  Plan Discharge plan remains appropriate    Co-evaluation                  AM-PAC OT "6 Clicks" Daily Activity     Outcome Measure   Help from another person eating meals?: A Little Help from another person taking care of personal grooming?: A Little Help from another person toileting, which includes using toliet, bedpan, or urinal?: A Little Help from another person bathing (including washing, rinsing, drying)?: A Little Help from another person to put on and taking off regular upper body clothing?: A Little Help from another person to put on and taking off regular lower body clothing?: A Little 6 Click Score: 18    End of Session Equipment Utilized During Treatment: Oxygen  OT Visit Diagnosis: Unsteadiness on feet (R26.81);Other abnormalities of gait and mobility (R26.89);Muscle weakness (generalized) (M62.81)   Activity Tolerance Patient tolerated treatment well   Patient Left in chair;with call bell/phone within reach   Nurse Communication Mobility status        Time: 3762-8315 OT Time Calculation (min): 13 min  Charges: OT General Charges $OT Visit: 1 Visit OT Treatments $Therapeutic Exercise: 8-22 mins  Dalphine Handing, MSOT, OTR/L Acute Rehabilitation Services [facility] Office Number: [phone] Pager: [phone]  Dalphine Handing 03/01/2020, 5:56 PM

## 2020-03-02 DIAGNOSIS — U071 COVID-19: Secondary | ICD-10-CM | POA: Diagnosis not present

## 2020-03-02 DIAGNOSIS — E875 Hyperkalemia: Secondary | ICD-10-CM | POA: Diagnosis not present

## 2020-03-02 DIAGNOSIS — E119 Type 2 diabetes mellitus without complications: Secondary | ICD-10-CM | POA: Diagnosis not present

## 2020-03-02 DIAGNOSIS — J9601 Acute respiratory failure with hypoxia: Secondary | ICD-10-CM | POA: Diagnosis not present

## 2020-03-02 LAB — BASIC METABOLIC PANEL
Anion gap: 5 (ref 5–15)
BUN: 22 mg/dL (ref 8–23)
CO2: 29 mmol/L (ref 22–32)
Calcium: 7.7 mg/dL — ABNORMAL LOW (ref 8.9–10.3)
Chloride: 101 mmol/L (ref 98–111)
Creatinine, Ser: 0.81 mg/dL (ref 0.61–1.24)
GFR calc non Af Amer: >60 mL/min (ref >60)
Glucose, Bld: 114 mg/dL — ABNORMAL HIGH (ref 70–99)
Potassium: 4.1 mmol/L (ref 3.5–5.1)
Sodium: 135 mmol/L (ref 135–145)

## 2020-03-02 LAB — GLUCOSE, CAPILLARY: Glucose-Capillary: 139 mg/dL — ABNORMAL HIGH (ref 70–99)

## 2020-03-02 MED ORDER — INSULIN DETEMIR 100 UNIT/ML ~~LOC~~ SOLN
5.0000 [IU] | Freq: Every day | SUBCUTANEOUS | Status: DC
  Administered 2020-03-03 – 2020-03-08 (×6): 5 [IU] via SUBCUTANEOUS
  Filled 2020-03-02 (×6): qty 0.05

## 2020-03-02 NOTE — Progress Notes (Signed)
MEWS score yellow because RT only documented a HR

## 2020-03-02 NOTE — Progress Notes (Signed)
Physical Therapy Treatment Patient Details Name: Gordon Novak MRN: [id number] DOB: [birth year] Today's Date: 03/02/2020    History of Present Illness Pt is an 85 y.o. male who tested (+) COVID-19 on 02/07/20, now admitted 02/15/20 with hypoxia; workup for acute hypoxemic respiratory failure due to COVID-19 PNA with severe lung parenchymal injury. PMH includes CAD, HTN, HLD, DM2, PE; pt fully vaccinated.    PT Comments    Pt continues to be eager to work with therapy. Focus of session checking for orthostatic hypotension, assessing strength and balance and ambulation. Pt BP with no appreciable change between sitting and standing today and no complaints of dizziness. With next bout of standing had pt march in place, requiring HHA. Pt with SaO2 90%O2 with sitting and pt stopped purse lip breathing and dropped to 79%O2. Pt able to recover to 90%O2 with continued purse lip breathing. Educated on need to continue purse lip breathing even after he sits to keep from further drop in SaO2. Pt acknowledges and is able to demonstrate with short bouts of ambulation around the room. Pt is obviously fatigued, but happy that he was able to walk a little today. D/c plans remain appropriate at this time. PT will continue to follow acutely.    Follow Up Recommendations  Home health PT;Supervision for mobility/OOB     Equipment Recommendations  Rolling walker with 5" wheels;3in1 (PT)       Precautions / Restrictions Precautions Precautions: Fall Precaution Comments: Quick to desaturate  Restrictions Weight Bearing Restrictions: No    Mobility  Bed Mobility               General bed mobility comments: up in recliner, returned to recliner  Transfers Overall transfer level: Needs assistance Equipment used: None Transfers: Sit to/from Stand Sit to Stand: Min guard         General transfer comment: min guard for safety, for 5x sit<>stand from recliner, straight back chair and  BSC  Ambulation/Gait Ambulation/Gait assistance: Min assist Gait Distance (Feet): 12 Feet (1x6', 1x8', 1x12') Assistive device: Rolling walker (2 wheeled) Gait Pattern/deviations: Step-through pattern;Decreased step length - right;Decreased step length - left;Shuffle Gait velocity: Decreased   General Gait Details: light min A for steadying had pt ambulate from recliner to chair 6 feet away, and then to [facility] 8 feet away and back to the recliner 12 feet away with seated restbreaks for his SaO2/WoB to recover         Balance Overall balance assessment: Needs assistance Sitting-balance support: No upper extremity supported;Feet supported Sitting balance-Leahy Scale: Good     Standing balance support: During functional activity;No upper extremity supported;Single extremity supported Standing balance-Leahy Scale: Poor Standing balance comment: able to statically stand, however any movement requires UE support                            Cognition Arousal/Alertness: Awake/alert Behavior During Therapy: WFL for tasks assessed/performed Overall Cognitive Status: Within Functional Limits for tasks assessed                                 General Comments: WFL for simple tasks, following all commands. Having to repeat at times due to [facility]      Exercises Other Exercises Other Exercises: Marching in place: x10    General Comments General comments (skin integrity, edema, etc.): Pt on 13L HFNC on entry with SaO2 100%O2,  on 15L O2 via HFNC able to maintain SaO2 >83%O2 with short bouts of ambulation requiring increased effort and purse lip breathing to recover, HR 76-115 bpm during session, in standing BP98/56 after ambulation BP 129/41      Pertinent Vitals/Pain             PT Goals (current goals can now be found in the care plan section) Acute Rehab PT Goals Patient Stated Goal: "I want to get back home as soon as I can... I hope our traveling days have not  come to an end" PT Goal Formulation: With patient Time For Goal Achievement: 03/08/20 Potential to Achieve Goals: Fair    Frequency    Min 3X/week      PT Plan Current plan remains appropriate       AM-PAC PT "6 Clicks" Mobility   Outcome Measure  Help needed turning from your back to your side while in a flat bed without using bedrails?: None Help needed moving from lying on your back to sitting on the side of a flat bed without using bedrails?: None Help needed moving to and from a bed to a chair (including a wheelchair)?: A Little Help needed standing up from a chair using your arms (e.g., wheelchair or bedside chair)?: A Little Help needed to walk in hospital room?: A Little Help needed climbing 3-5 steps with a railing? : A Lot 6 Click Score: 19    End of Session Equipment Utilized During Treatment: Oxygen Activity Tolerance: Patient tolerated treatment well;Treatment limited secondary to medical complications (Comment) Patient left: in chair;with call bell/phone within reach Nurse Communication: Mobility status PT Visit Diagnosis: Other abnormalities of gait and mobility (R26.89)     Time: 6811-5726 PT Time Calculation (min) (ACUTE ONLY): 31 min  Charges:  $Gait Training: 8-22 mins $Therapeutic Exercise: 8-22 mins                     Tylon Kemmerling B. Beverely Risen PT, DPT Acute Rehabilitation Services Pager [phone] Office [phone]    Elon Alas Fleet 03/02/2020, 5:02 PM

## 2020-03-02 NOTE — Progress Notes (Signed)
PROGRESS NOTE                                                                                                                                                                                                             Patient Demographics:    Mazen Marcin, is a 85 y.o. male, DOB - Sep 30, 1934, JXB:[id number]  Outpatient Primary MD for the patient is [name], MD   Admit date - 02/15/2020   LOS - 16   Brief Narrative: Patient is a 85 y.o. male with PMHx of CAD s/p PCI 2020, DM-2, HLD, HTN, history of PE no longer on anticoagulation-presented with shortness of breath-found to have severe hypoxemia requiring high flow oxygen-due to COVID-19 pneumonia.  COVID-19 vaccinated status: Vaccinated  Significant Events: 9/21>> Admit to [facility] for hypoxia due to COVID-19 pneumonia  Significant studies: 9/21>>Chest x-ray: Diffuse lung infiltrates 9/22>> CTA chest: No PE-severe bilateral patchy/groundglass appearing infiltrates, cholelithiasis 9/22>> bilateral lower extremity Doppler: No DVT 9/22>> Echo: EF 55-60%, RV systolic function normal. 9/30>> chest x-ray: Patchy bilateral airspace disease-not significantly changed from prior.  COVID-19 medications: Steroids: 9/21>> Remdesivir: 9/21>> 9/25 Baricitinib: 9/21>>  Antibiotics: Rocephin: 9/22>> 9/26 Zithromax: 9/22>> 9/26  Microbiology data: 9/21 >>blood culture: No growth  Procedures: None  Consults: Cardiology  DVT prophylaxis: enoxaparin (LOVENOX) injection 40 mg at twice daily dosing (intermediate dosing) SCDs Start: 02/15/20 1300    Subjective:   Patient concerned about his leg swelling although previously has mentioned that this is chronic.  Patient has been distracted occasionally.  However he denies any other complaints.  Denies any pain in his legs.  Shortness of breath is improving.      Assessment  & Plan :   Acute Hypoxic Resp Failure due to  Covid 19 Viral pneumonia Patient has completed course of Remdesivir.  Remains on steroids and baricitinib.  Has chronic left lower extremity edema.   Patient has had fluctuating course during this hospitalization.  He was improving and then a few days ago he got worse again requiring more oxygen.  Chest x-ray showed subcutaneous emphysema.  CT scan of the chest was done 10/6 which indeed shows pneumomediastinum and subcutaneous emphysema which could be the reason for his worsening.  No pneumothorax was noted.  His opacities were thought to be better compared to previous films. Patient remains on HFNC 13  L saturating in the early 90s. Patient seems to have plateaued in terms of his recovery process.  Hopefully he will show some improvement in the next 2 to 3 days.  Continue supportive care.  He remains on steroids.  Mobilize.   Elevated troponins Minimally elevated troponins with flat trend. Not consistent with ACS. Echo with preserved EF.  Cardiology evaluation completed. No recommendations apart from supportive care.    Minimally elevated D-dimer Has been trending downwards.  Has a history of PE in 2020 and completed 1 year treatment with Xarelto in May 2021.  CT angiogram chest and Doppler studies negative for VTE.  Continue intermediate dosing with Lovenox.  D-dimer has been improving.  Transaminitis Likely secondary to COVID-19.  Had improved.  Recheck in the outpatient setting.  Chronic systolic heart failure/hyperkalemia Echocardiogram done during this hospitalization shows normal systolic function.  Strict ins and outs and daily weights.  Furosemide being continued at a lower dose of 20 mg daily.  Dose was decreased due to low blood pressures.  Monitor electrolytes.  Potassium is normal.  He was given Lokelma few days ago.   History of CAD Stable.  Seen by cardiology initially due to elevated troponin.  Currently without any chest pain.  Continue medical management.  On aspirin beta-blocker  and statin.    DM-2 (A1c 7.1 on 9/21) Noted to have hypoglycemia on 9/30 and dose of Levemir was decreased.  Stable the last 2 readings have been on the lower side.  May have to go down further on the dose of Levemir.    Gout Continue allopurinol  BPH Continue Flomax  Chronic left lower extremity swelling At baseline. Lower extremity Dopplers were negative.  Constipation Patient keeps thinking that he has not had a bowel movement but nurses report multiple bowel movements.  Continue MiraLAX.  Cholelithiasis Incidentally noted on ultrasound of the abdomen.  Asymptomatic.  DVT prophylaxis: Lovenox Family Communication  :  Daughter [name] [phone]).  Updated yesterday via voicemail.  Code Status : DNR  Diet :  Diet Order            Diet heart healthy/carb modified Room service appropriate? No; Fluid consistency: Thin  Diet effective now                  Disposition Plan  : Home health recommended by PT/OT  Status is: Inpatient  Remains inpatient appropriate because:Inpatient level of care appropriate due to severity of illness   Dispo: The patient is from: Home              Anticipated d/c is to: Home              Anticipated d/c date is: > 3 days              Patient currently is not medically stable to d/c.  Barriers to discharge: Continues to have high oxygen requirements  Antimicorbials  :    Anti-infectives (From admission, onward)   Start     Dose/Rate Route Frequency Ordered Stop   02/16/20 1330  azithromycin (ZITHROMAX) 500 mg in sodium chloride 0.9 % 250 mL IVPB        500 mg 250 mL/hr over 60 Minutes Intravenous Every 24 hours 02/16/20 1237 02/20/20 1320   02/16/20 1300  cefTRIAXone (ROCEPHIN) 2 g in sodium chloride 0.9 % 100 mL IVPB        2 g 200 mL/hr over 30 Minutes Intravenous Every 24 hours 02/16/20 1237  02/20/20 1252   02/16/20 1000  remdesivir 100 mg in sodium chloride 0.9 % 100 mL IVPB       "Followed by" Linked Group Details   100  mg 200 mL/hr over 30 Minutes Intravenous Daily 02/15/20 1301 02/19/20 0855   02/15/20 1400  remdesivir 200 mg in sodium chloride 0.9% 250 mL IVPB       "Followed by" Linked Group Details   200 mg 580 mL/hr over 30 Minutes Intravenous Once 02/15/20 1301 02/15/20 1504      Inpatient Medications  Scheduled Meds: . allopurinol  300 mg Oral Daily  . vitamin C  500 mg Oral Daily  . aspirin EC  81 mg Oral Daily  . chlorhexidine  15 mL Mouth Rinse BID  . enoxaparin (LOVENOX) injection  40 mg Subcutaneous BID  . furosemide  20 mg Oral Daily  . insulin aspart  0-15 Units Subcutaneous TID WC  . insulin detemir  8 Units Subcutaneous Daily  . mouth rinse  15 mL Mouth Rinse q12n4p  . methylPREDNISolone (SOLU-MEDROL) injection  40 mg Intravenous Q12H  . mometasone-formoterol  2 puff Inhalation BID  . pantoprazole  40 mg Oral Daily  . polyethylene glycol  17 g Oral BID  . prednisoLONE acetate  1 drop Both Eyes QHS  . rosuvastatin  10 mg Oral Daily  . senna-docusate  2 tablet Oral QHS  . tamsulosin  0.4 mg Oral QHS  . zinc sulfate  220 mg Oral Daily   Continuous Infusions: . sodium chloride 10 mL/hr at 02/17/20 0500   PRN Meds:.acetaminophen, bisacodyl, chlorpheniramine-HYDROcodone, guaiFENesin-dextromethorphan, ondansetron **OR** ondansetron (ZOFRAN) IV, sodium phosphate   See all Orders from today for further details   Osvaldo Shipper M.D on 03/02/2020 at 11:11 AM  To page go to [email or web address] - use universal password  [facility] -  Office  [phone]    Objective:   Vitals:   03/01/20 2303 03/02/20 0358 03/02/20 0733 03/02/20 0818  BP: 115/62 101/62 (!) 108/52   Pulse: 83 77  (!) 108  Resp: 19 20 12 20   Temp: 97.6 F (36.4 C) 97.6 F (36.4 C) 97.8 F (36.6 C)   TempSrc: Axillary Axillary Oral   SpO2: 99% 100%  90%  Weight:      Height:        Wt Readings from Last 3 Encounters:  03/01/20 75.3 kg  02/09/20 83.9 kg  08/30/19 83 kg     Intake/Output  Summary (Last 24 hours) at 03/02/2020 1111 Last data filed at 03/02/2020 1002 Gross per 24 hour  Intake 600 ml  Output 600 ml  Net 0 ml     Physical Exam  General appearance: Awake alert.  In no distress Resp: Mildly tachypneic at rest.  Coarse breath sounds with crackles bilateral bases.  No wheezing or rhonchi. Cardio: S1-S2 is normal regular.  No S3-S4.  No rubs murmurs or bruit GI: Abdomen is soft.  Nontender nondistended.  Bowel sounds are present normal.  No masses organomegaly Extremities: Chronic lower extremity edema left greater than right Neurologic:  No focal neurological deficits.       Data Review:    CBC Recent Labs  Lab 02/24/20 1256 02/25/20 0803 02/26/20 0500 02/27/20 0440 03/01/20 0726  WBC 15.3* 10.6* 8.4 11.1* 10.4  HGB 15.1 13.5 13.3 15.0 13.2  HCT 45.1 40.4 40.2 45.5 39.5  PLT 296 234 218 272 174  MCV 94.0 93.7 92.8 93.8 93.2  MCH 31.5 31.3 30.7 30.9 31.1  MCHC  33.5 33.4 33.1 33.0 33.4  RDW 13.1 13.1 12.8 12.9 13.1    Chemistries  Recent Labs  Lab 02/24/20 1256 02/24/20 1256 02/25/20 0803 02/25/20 0803 02/26/20 0500 02/26/20 0500 02/27/20 0440 02/28/20 0500 02/29/20 0222 03/01/20 0726 03/02/20 0337  NA 136   < > 138   < > 136   < > 137 136 136 139 135  K 4.5   < > 5.0   < > 5.1   < > 4.9 5.3* 4.7 4.1 4.1  CL 99   < > 102   < > 99   < > 96* 99 99 99 101  CO2 27   < > 29   < > 30   < > 30 30 30 31 29   GLUCOSE 216*   < > 148*   < > 112*   < > 148* 185* 99 88 114*  BUN 29*   < > 29*   < > 32*   < > 28* 28* 30* 26* 22  CREATININE 0.93   < > 0.81   < > 0.99   < > 1.07 1.09 0.89 0.93 0.81  CALCIUM 8.0*   < > 7.9*   < > 8.0*   < > 8.2* 8.1* 7.9* 7.9* 7.7*  AST 53*  --  30  --  34  --   --   --   --   --   --   ALT 94*  --  69*  --  72*  --   --   --   --   --   --   ALKPHOS 95  --  74  --  77  --   --   --   --   --   --   BILITOT 0.9  --  1.1  --  1.0  --   --   --   --   --   --    < > = values in this interval not displayed.      Recent Labs    03/01/20 0726  DDIMER 0.85*        Component Value Date/Time   BNP 199.1 (H) 02/15/2020 1040    Micro Results No results found for this or any previous visit (from the past 240 hour(s)).  Radiology Reports CT Head Wo Contrast  Result Date: 02/09/2020 CLINICAL DATA:  Trauma, fall on blood thinners EXAM: CT HEAD WITHOUT CONTRAST CT CERVICAL SPINE WITHOUT CONTRAST TECHNIQUE: Multidetector CT imaging of the head and cervical spine was performed following the standard protocol without intravenous contrast. Multiplanar CT image reconstructions of the cervical spine were also generated. COMPARISON:  None. FINDINGS: CT HEAD FINDINGS Brain: No acute territorial infarction, hemorrhage, or intracranial mass. Moderate atrophy. Mild hypodensity in the white matter consistent with chronic small vessel ischemic change. The ventricles are nonenlarged. Vascular: No hyperdense vessels.  Carotid vascular calcification. Skull: No depressed skull fracture. Sinuses/Orbits: Mucosal thickening in the maxillary and ethmoid sinuses. Small fluid level in the left sphenoid sinus but no central skull base lucency is seen. Possible chronic left nasal bone fracture. Other: Mild right posterior scalp swelling. CT CERVICAL SPINE FINDINGS Alignment: No subluxation.  Facet alignment within normal limits. Skull base and vertebrae: No acute fracture. No primary bone lesion or focal pathologic process. Soft tissues and spinal canal: No prevertebral fluid or swelling. No visible canal hematoma. Disc levels: Multiple level degenerative change with moderate severe disease at C5-C6 and C6-C7. Facet degenerative changes  at multiple levels with foraminal narrowing. Upper chest: Apical scarring Other: None IMPRESSION: 1. No CT evidence for acute intracranial abnormality. Atrophy and mild chronic small vessel ischemic changes of the white matter. 2. Degenerative changes of the cervical spine. No acute osseous  abnormality. Electronically Signed   By: Jasmine Pang M.D.   On: 02/09/2020 01:13   CT CHEST W CONTRAST  Result Date: 03/01/2020 CLINICAL DATA:  COVID pneumonia. EXAM: CT CHEST WITH CONTRAST TECHNIQUE: Multidetector CT imaging of the chest was performed during intravenous contrast administration. CONTRAST:  81mL OMNIPAQUE IOHEXOL 300 MG/ML  SOLN COMPARISON:  Chest CT 02/16/2020 FINDINGS: Cardiovascular: The heart is normal in size. No pericardial effusion. The aorta is normal in caliber. No dissection or focal aneurysm. Stable moderate atherosclerotic calcifications. The branch vessels are patent. Stable three-vessel coronary artery calcifications. The pulmonary arteries are fairly well opacified. No filling defects to suggest pulmonary embolism. Mediastinum/Nodes: Fairly extensive mediastinal air. This is dissecting up into the neck and all along the chest wall. Mild pneumopericardium also noted. Gas is dissecting into the intercostal muscles. There is a small left basilar pneumothorax and probable small anterior pneumothoraces but no large pneumothorax requiring chest tube placement. The esophagus is grossly normal. Lungs/Pleura: Vague diffuse patchy ground-glass infiltrates consistent with known COVID pneumonia. Overall definite improvement since the prior CT scan. Upper Abdomen: No significant upper abdominal findings. Numerous gallstones are again noted in the gallbladder. Musculoskeletal: No acute bony findings. Remote healed right rib fractures are noted. IMPRESSION: 1. Fairly extensive mediastinal air dissecting up into the neck and all along the chest wall. 2. Small left basilar pneumothorax and probable small anterior pneumothoraces but no large pneumothorax. 3. Persistent diffuse patchy ground-glass infiltrates consistent with known COVID pneumonia. Overall definite improvement since the prior CT scan. 4. Stable three-vessel coronary artery calcifications. 5. Cholelithiasis. 6. Aortic  atherosclerosis. Aortic Atherosclerosis (ICD10-I70.0). Electronically Signed   By: Rudie Meyer M.D.   On: 03/01/2020 14:30   CT ANGIO CHEST PE W OR WO CONTRAST  Result Date: 02/16/2020 CLINICAL DATA:  COVID positive peer EXAM: CT ANGIOGRAPHY CHEST WITH CONTRAST TECHNIQUE: Multidetector CT imaging of the chest was performed using the standard protocol during bolus administration of intravenous contrast. Multiplanar CT image reconstructions and MIPs were obtained to evaluate the vascular anatomy. CONTRAST:  OMNIPAQUE IOHEXOL 350 MG/ML SOLN COMPARISON:  Oct 22, 2018 FINDINGS: Cardiovascular: There is moderate severity calcification of the thoracic aorta. Satisfactory opacification of the pulmonary arteries to the segmental level. No evidence of pulmonary embolism. Normal heart size. No pericardial effusion. Mediastinum/Nodes: No enlarged mediastinal, hilar, or axillary lymph nodes. Thyroid gland, trachea, and esophagus demonstrate no significant findings. Lungs/Pleura: Marked severity patchy and ground-glass appearing infiltrates are seen throughout both lungs. There is no evidence of a pleural effusion or pneumothorax. Upper Abdomen: Numerous subcentimeter gallstones are seen within the lumen of an otherwise normal-appearing gallbladder. Musculoskeletal: No chest wall abnormality. No acute or significant osseous findings. Review of the MIP images confirms the above findings. IMPRESSION: 1. Marked severity bilateral patchy and ground-glass appearing infiltrates. 2. Cholelithiasis. 3. Aortic atherosclerosis. Aortic Atherosclerosis (ICD10-I70.0). Electronically Signed   By: Aram Candela M.D.   On: 02/16/2020 22:13   CT Cervical Spine Wo Contrast  Result Date: 02/09/2020 CLINICAL DATA:  Trauma, fall on blood thinners EXAM: CT HEAD WITHOUT CONTRAST CT CERVICAL SPINE WITHOUT CONTRAST TECHNIQUE: Multidetector CT imaging of the head and cervical spine was performed following the standard protocol without  intravenous contrast. Multiplanar CT image reconstructions of  the cervical spine were also generated. COMPARISON:  None. FINDINGS: CT HEAD FINDINGS Brain: No acute territorial infarction, hemorrhage, or intracranial mass. Moderate atrophy. Mild hypodensity in the white matter consistent with chronic small vessel ischemic change. The ventricles are nonenlarged. Vascular: No hyperdense vessels.  Carotid vascular calcification. Skull: No depressed skull fracture. Sinuses/Orbits: Mucosal thickening in the maxillary and ethmoid sinuses. Small fluid level in the left sphenoid sinus but no central skull base lucency is seen. Possible chronic left nasal bone fracture. Other: Mild right posterior scalp swelling. CT CERVICAL SPINE FINDINGS Alignment: No subluxation.  Facet alignment within normal limits. Skull base and vertebrae: No acute fracture. No primary bone lesion or focal pathologic process. Soft tissues and spinal canal: No prevertebral fluid or swelling. No visible canal hematoma. Disc levels: Multiple level degenerative change with moderate severe disease at C5-C6 and C6-C7. Facet degenerative changes at multiple levels with foraminal narrowing. Upper chest: Apical scarring Other: None IMPRESSION: 1. No CT evidence for acute intracranial abnormality. Atrophy and mild chronic small vessel ischemic changes of the white matter. 2. Degenerative changes of the cervical spine. No acute osseous abnormality. Electronically Signed   By: Jasmine PangKim  Fujinaga M.D.   On: 02/09/2020 01:13   DG Chest Port 1 View  Result Date: 03/01/2020 CLINICAL DATA:  Pneumonia due to COVID-19 EXAM: PORTABLE CHEST 1 VIEW COMPARISON:  Two days ago FINDINGS: Greater chest wall emphysema. Pneumomediastinum. There is biapical extrapleural gas versus trace pneumothorax, unchanged. Bilateral pulmonary infiltrate. Normal heart size. IMPRESSION: 1. Progressive chest wall emphysema. 2. Small biapical pneumothorax versus extrapleural gas, interval stability  favoring the latter. 3. Stable infiltrates. Electronically Signed   By: Marnee SpringJonathon  Watts M.D.   On: 03/01/2020 07:13   DG CHEST PORT 1 VIEW  Result Date: 02/28/2020 CLINICAL DATA:  Respiratory failure, COVID-19 positive, chest tightness EXAM: PORTABLE CHEST 1 VIEW COMPARISON:  02/24/2020 FINDINGS: Stable cardiomediastinal contours. Atherosclerotic calcification of the aortic knob. Subtle airspace opacities bilaterally with a predominantly peripheral and basilar distribution, not appreciably changed from prior. No pleural effusion. No evidence of pneumothorax. Interval development of subcutaneous emphysema most pronounced at the base of the right neck and within the right axillary region. IMPRESSION: 1. Interval development of subcutaneous emphysema at the base of the right neck and within the right axillary region. No evidence of pneumothorax. Correlate with physical exam and for history of attempted central line placement. 2. Stable bilateral peripheral and basilar airspace opacities. Electronically Signed   By: Duanne GuessNicholas  Plundo D.O.   On: 02/28/2020 13:07   DG Chest Port 1 View  Result Date: 02/15/2020 CLINICAL DATA:  COVID positive.  Hypoxia. EXAM: PORTABLE CHEST 1 VIEW COMPARISON:  Oct 22, 2018 FINDINGS: Diffuse bilateral interstitial and airspace opacities, new from prior. No visible pleural effusion. No pneumothorax. Cardiac silhouette is accentuated by low lung volumes and portable technique. Aortic atherosclerosis. IMPRESSION: Diffuse bilateral interstitial and airspace opacities, likely multifocal pneumonia given the clinical history. Electronically Signed   By: Feliberto HartsFrederick S Jones MD   On: 02/15/2020 10:56   DG Chest Port 1V same Day  Result Date: 02/24/2020 CLINICAL DATA:  COVID EXAM: PORTABLE CHEST 1 VIEW COMPARISON:  02/15/2020 FINDINGS: Bilateral patchy airspace opacities are again noted, similar prior study. Slight increase in lung volumes. Heart is normal size. No effusions or pneumothorax.  No acute bony abnormality. IMPRESSION: Patchy bilateral airspace disease, not significantly changed since prior study. Slight increased lung volumes. Electronically Signed   By: Charlett NoseKevin  Dover M.D.   On: 02/24/2020 08:31  VAS [place] LOWER EXTREMITY VENOUS (DVT)  Result Date: 02/16/2020  Lower Venous DVTStudy Indications: Swelling, and elevated d dimer.  Risk Factors: + covid. Performing Technologist: Jannet Askew RCT RDMS  Examination Guidelines: A complete evaluation includes B-mode imaging, spectral Doppler, color Doppler, and power Doppler as needed of all accessible portions of each vessel. Bilateral testing is considered an integral part of a complete examination. Limited examinations for reoccurring indications may be performed as noted. The reflux portion of the exam is performed with the patient in reverse Trendelenburg.  +---------+---------------+---------+-----------+----------+--------------+ RIGHT    CompressibilityPhasicitySpontaneityPropertiesThrombus Aging +---------+---------------+---------+-----------+----------+--------------+ CFV      Full           Yes      Yes                                 +---------+---------------+---------+-----------+----------+--------------+ SFJ      Full                                                        +---------+---------------+---------+-----------+----------+--------------+ FV Prox  Full                                                        +---------+---------------+---------+-----------+----------+--------------+ FV Mid   Full                                                        +---------+---------------+---------+-----------+----------+--------------+ FV DistalFull                                                        +---------+---------------+---------+-----------+----------+--------------+ PFV      Full                                                         +---------+---------------+---------+-----------+----------+--------------+ POP      Full           Yes      Yes                                 +---------+---------------+---------+-----------+----------+--------------+ PTV      Full                                                        +---------+---------------+---------+-----------+----------+--------------+ PERO     Full                                                        +---------+---------------+---------+-----------+----------+--------------+   +---------+---------------+---------+-----------+----------+--------------+  LEFT     CompressibilityPhasicitySpontaneityPropertiesThrombus Aging +---------+---------------+---------+-----------+----------+--------------+ CFV      Full           Yes      Yes                                 +---------+---------------+---------+-----------+----------+--------------+ SFJ      Full                                                        +---------+---------------+---------+-----------+----------+--------------+ FV Prox  Full                                                        +---------+---------------+---------+-----------+----------+--------------+ FV Mid   Full                                                        +---------+---------------+---------+-----------+----------+--------------+ FV DistalFull                                                        +---------+---------------+---------+-----------+----------+--------------+ PFV      Full                                                        +---------+---------------+---------+-----------+----------+--------------+ POP      Full           Yes      Yes                                 +---------+---------------+---------+-----------+----------+--------------+ PTV      Full                                                         +---------+---------------+---------+-----------+----------+--------------+ PERO     Full                                                        +---------+---------------+---------+-----------+----------+--------------+     Summary: RIGHT: - There is no evidence of deep vein thrombosis in the lower extremity.  - No cystic structure found in the popliteal fossa.  LEFT: - There is no evidence of deep vein thrombosis in the lower extremity.  - No  cystic structure found in the popliteal fossa.  *See table(s) above for measurements and observations. Electronically signed by Coral Else MD on 02/16/2020 at 9:33:24 PM.    Final    ECHOCARDIOGRAM LIMITED  Result Date: 02/16/2020    ECHOCARDIOGRAM LIMITED REPORT   Patient Name:   JERIK FALLETTA Date of Exam: 02/16/2020 Medical Rec #:  [id number]       Height:       72.0 in Accession #:    [id number]      Weight:       183.0 lb Date of Birth:  April 30, 1935       BSA:          2.052 m Patient Age:    84 years        BP:           114/53 mmHg Patient Gender: M               HR:           77 bpm. Exam Location:  Inpatient Procedure: Limited Echo, Cardiac Doppler and Color Doppler Indications:    Elevated Troponin  History:        Patient has prior history of Echocardiogram examinations, most                 recent 02/17/2019. CHF and Cardiomyopathy, CAD, Arrythmias:Atrial                 Fibrillation, Signs/Symptoms:Shortness of Breath and Fever; Risk                 Factors:Hypertension, Dyslipidemia, OBESITY and Diabetes.                 COVID+.  Sonographer:    Lavenia Atlas Referring Phys: [id number] Kasandra Knudsen PAHWANI IMPRESSIONS  1. Left ventricular ejection fraction, by estimation, is 55 to 60%.  2. Right ventricular systolic function is normal. The right ventricular size is normal. There is normal pulmonary artery systolic pressure.  3. The interatrial septum is aneuyrsmal with frequent bowing from right to left.  4. The mitral valve is normal in structure.  Trivial mitral valve regurgitation.  5. There is mild calcification of the aortic valve. There is mild thickening of the aortic valve. Mild aortic valve sclerosis is present, with no evidence of aortic valve stenosis. Comparison(s): Compared to prior echo on 01/2019, the LVEF appears mildly improved to 55-60%. FINDINGS  Left Ventricle: Septal motion is abnormal consistent with prior surgery. Left ventricular ejection fraction, by estimation, is 55 to 60%. The left ventricular internal cavity size was normal in size. There is no left ventricular hypertrophy. Right Ventricle: The right ventricular size is normal. Right ventricular systolic function is normal. There is normal pulmonary artery systolic pressure. The tricuspid regurgitant velocity is 2.41 m/s, and with an assumed right atrial pressure of 3 mmHg,  the estimated right ventricular systolic pressure is 26.2 mmHg. Left Atrium: Left atrial size was normal in size. Pericardium: There is no evidence of pericardial effusion. Mitral Valve: The mitral valve is normal in structure. There is mild thickening of the mitral valve leaflet(s). Mild mitral annular calcification. Trivial mitral valve regurgitation. Tricuspid Valve: The tricuspid valve is normal in structure. Tricuspid valve regurgitation is mild. Aortic Valve: There is mild calcification of the aortic valve. There is mild thickening of the aortic valve. Mild aortic valve sclerosis is present, with no evidence of aortic valve stenosis. Pulmonic Valve: The pulmonic valve was not assessed. Aorta:  The aortic root is normal in size and structure. LEFT VENTRICLE PLAX 2D LVIDd:         3.80 cm  Diastology LVIDs:         2.70 cm  LV e' medial:    7.51 cm/s LV PW:         1.20 cm  LV E/e' medial:  7.7 LV IVS:        0.90 cm  LV e' lateral:   5.98 cm/s LVOT diam:     1.90 cm  LV E/e' lateral: 9.7 LVOT Area:     2.84 cm  RIGHT VENTRICLE RV S prime:     13.30 cm/s LEFT ATRIUM         Index LA diam:    2.80 cm 1.36  cm/m   AORTA Ao Root diam: 2.50 cm MITRAL VALVE               TRICUSPID VALVE MV Area (PHT): 5.38 cm    TR Peak grad:   23.2 mmHg MV Decel Time: 141 msec    TR Vmax:        241.00 cm/s MV E velocity: 57.80 cm/s MV A velocity: 70.30 cm/s  SHUNTS MV E/A ratio:  0.82        Systemic Diam: 1.90 cm Laurance Flatten MD Electronically signed by Laurance Flatten MD Signature Date/Time: 02/16/2020/4:23:30 PM    Final    ABORTED INVASIVE LAB PROCEDURE  Result Date: 02/15/2020 This case was aborted.  US Abdomen Limited RUQ  Result Date: 02/15/2020 CLINICAL DATA:  Elevated liver enzymes. EXAM: ULTRASOUND ABDOMEN LIMITED RIGHT UPPER QUADRANT COMPARISON:  CT abdomen 07/06/2019. FINDINGS: Gallbladder: Gallstones measuring up to 7 mm noted. Gallbladder wall thickness normal. No Murphy sign. Common bile duct: Diameter: Not visualized due to overlying bowel gas. Liver: Left no not visualized due to overlying bowel gas. No focal lesion identified. Within normal limits in parenchymal echogenicity. Portal vein is patent on color Doppler imaging with normal direction of blood flow towards the liver. Other: Limited study due to overlying bowel gas. IMPRESSION: 1.  Multiple small gallstones.  No gallbladder wall thickening. 2. Limited exam due to overlying bowel gas. Common bile duct not visualized. Portions of the liver not visualized. Electronically Signed   By: Maisie Fus  Register   On: 02/15/2020 15:35

## 2020-03-02 NOTE — TOC Progression Note (Signed)
Transition of Care [facility]) - Progression Note    Patient Details  Name: ANGELINA NEECE MRN: [id number] Date of Birth: 22-Feb-1935  Transition of Care [facility]) CM/SW Contact  Nance Pear, RN Phone Number: 03/02/2020, 12:04 PM  Clinical Narrative:    Received message that daughter had called asking about equipment.  Called daughter, [name], on phone to clarify. [name] had received call from patient in regards to questions CM had asked earlier this morning in my assessment about what home equipment do you already have.  [name] said that they have a very old walker at home but that it might not be appropriate for patient.  CM discussed that we will be ordering walker for patient to take home.  Also updated daughter about [facility] home health being the choice for post discharge PT.  Daughter did say that patient had been very active in mowing grass but had fallen several times, church is putting in a ramp this weekend to home.  Good family support system.  TOC following for progression of care.   Expected Discharge Plan: Home w Home Health Services Barriers to Discharge: Continued Medical Work up  Expected Discharge Plan and Services Expected Discharge Plan: Home w Home Health Services   Discharge Planning Services: CM Consult Post Acute Care Choice: Home Health Living arrangements for the past 2 months: Single Family Home                             HH Agency: [facility] Date [facility] Agency Contacted: 03/01/20 Time HH Agency Contacted: 1158 Representative spoke with at [facility] Agency: Confirmed patient choice for [facility]   Social Determinants of Health (SDOH) Interventions    Readmission Risk Interventions No flowsheet data found.

## 2020-03-02 NOTE — TOC Initial Note (Signed)
Transition of Care [facility]) - Initial/Assessment Note    Patient Details  Name: Gordon Novak MRN: [id number] Date of Birth: 1934-11-03  Transition of Care [facility]) CM/SW Contact:    Gordon Pear, RN Phone Number: 03/02/2020, 9:18 AM  Clinical Narrative:                 Case manager spoke to patient on phone due to COVID >Patient has equipment at home including walker >Lives with spouse in single family home >Patient has PCP Dr. Andrey Novak >Verified Medicare.gov choice is okay with [facility] as home health Case management to continue to monitor for additional needs.  Expected Discharge Plan: Home w Home Health Services Barriers to Discharge: Continued Medical Work up   Patient Goals and CMS Choice Patient states their goals for this hospitalization and ongoing recovery are:: to go home CMS Medicare.gov Compare Post Acute Care list provided to:: Patient Choice offered to / list presented to : Patient  Expected Discharge Plan and Services Expected Discharge Plan: Home w Home Health Services   Discharge Planning Services: CM Consult Post Acute Care Choice: Home Health Living arrangements for the past 2 months: Single Family Home                             HH Agency: [facility] Date [facility] Agency Contacted: 03/01/20 Time HH Agency Contacted: 1158 Representative spoke with at [facility] Agency: Confirmed patient choice for [facility]  Prior Living Arrangements/Services Living arrangements for the past 2 months: Single Family Home Lives with:: Spouse Patient language and need for interpreter reviewed:: Yes Do you feel safe going back to the place where you live?: Yes      Need for Family Participation in Patient Care: Yes (Comment) Care giver support system in place?: Yes (comment) Current home services: Home PT Criminal Activity/Legal Involvement Pertinent to Current Situation/Hospitalization: No - Comment as needed  Activities of Daily Living Home Assistive  Devices/Equipment: Environmental consultant (specify type), Shower chair without back ADL Screening (condition at time of admission) Patient's cognitive ability adequate to safely complete daily activities?: Yes Is the patient deaf or have difficulty hearing?: Yes Does the patient have difficulty seeing, even when wearing glasses/contacts?: Yes Does the patient have difficulty concentrating, remembering, or making decisions?: No Patient able to express need for assistance with ADLs?: Yes Does the patient have difficulty dressing or bathing?: No Independently performs ADLs?: Yes (appropriate for developmental age) Does the patient have difficulty walking or climbing stairs?: Yes Weakness of Legs: Both Weakness of Arms/Hands: None  Permission Sought/Granted Permission sought to share information with : Case Manager Permission granted to share information with : Yes, Verbal Permission Granted              Emotional Assessment   Attitude/Demeanor/Rapport: Engaged Affect (typically observed): Appropriate Orientation: : Oriented to Self, Oriented to Place, Oriented to  Time, Oriented to Situation Alcohol / Substance Use: Not Applicable Psych Involvement: No (comment)  Admission diagnosis:  Cardiac ischemia [I25.9] Elevated liver enzymes [R74.8] Acute hypoxemic respiratory failure due to COVID-19 (HCC) [U07.1, J96.01] Patient Active Problem List   Diagnosis Date Noted  . Acute hypoxemic respiratory failure due to COVID-19 (HCC) 02/15/2020  . Elevated liver enzymes 02/15/2020  . Essential hypertension   . Presence of drug-eluting stent in right coronary artery 12/02/2018  . Hypotension due to drugs 12/02/2018  . Ischemic cardiomyopathy 10/30/2018  . Personal history of PE (pulmonary embolism) 10/22/2018  .  Type 2 diabetes mellitus (HCC) 10/22/2018  . Coronary artery disease involving native coronary artery of native heart with angina pectoris (HCC) 10/22/2018  . Hyperlipidemia with target LDL less  than 70 10/20/2018  . Chronic combined systolic and diastolic heart failure (HCC) 10/20/2018  . Acute ST elevation myocardial infarction (STEMI) of inferior [name] (HCC) 10/18/2018  . Proctitis 05/14/2014  . Urinary tract infection 05/12/2014  . Leukocytosis 05/12/2014  . Tachycardia 05/12/2014  . Constipation 05/12/2014  . Hemorrhoids 05/12/2014  . Gout 05/12/2014  . Urinary tract infectious disease   . Nephrolithiasis 05/07/2014   PCP:  Gordon Banner, MD Pharmacy:   [phone] - [place], [place] - [phone] [place] [address] Phone: [phone] Fax: [phone]  [facility] [place], [place] - [address] [place] [address] Phone: [phone] Fax: [phone]     Social Determinants of Health (SDOH) Interventions    Readmission Risk Interventions No flowsheet data found.

## 2020-03-03 ENCOUNTER — Inpatient Hospital Stay (HOSPITAL_COMMUNITY): Payer: Medicare Other

## 2020-03-03 DIAGNOSIS — E119 Type 2 diabetes mellitus without complications: Secondary | ICD-10-CM | POA: Diagnosis not present

## 2020-03-03 DIAGNOSIS — J9601 Acute respiratory failure with hypoxia: Secondary | ICD-10-CM | POA: Diagnosis not present

## 2020-03-03 DIAGNOSIS — E875 Hyperkalemia: Secondary | ICD-10-CM | POA: Diagnosis not present

## 2020-03-03 DIAGNOSIS — U071 COVID-19: Secondary | ICD-10-CM | POA: Diagnosis not present

## 2020-03-03 LAB — COMPREHENSIVE METABOLIC PANEL
ALT: 67 U/L — ABNORMAL HIGH (ref 0–44)
AST: 36 U/L (ref 15–41)
Albumin: 2.1 g/dL — ABNORMAL LOW (ref 3.5–5.0)
Alkaline Phosphatase: 74 U/L (ref 38–126)
Anion gap: 8 (ref 5–15)
BUN: 23 mg/dL (ref 8–23)
CO2: 28 mmol/L (ref 22–32)
Calcium: 7.9 mg/dL — ABNORMAL LOW (ref 8.9–10.3)
Chloride: 100 mmol/L (ref 98–111)
Creatinine, Ser: 0.95 mg/dL (ref 0.61–1.24)
GFR calc non Af Amer: >60 mL/min (ref >60)
Glucose, Bld: 176 mg/dL — ABNORMAL HIGH (ref 70–99)
Potassium: 4.6 mmol/L (ref 3.5–5.1)
Sodium: 136 mmol/L (ref 135–145)
Total Bilirubin: 0.8 mg/dL (ref 0.3–1.2)
Total Protein: 4.5 g/dL — ABNORMAL LOW (ref 6.5–8.1)

## 2020-03-03 LAB — GLUCOSE, CAPILLARY
Glucose-Capillary: 135 mg/dL — ABNORMAL HIGH (ref 70–99)
Glucose-Capillary: 116 mg/dL — ABNORMAL HIGH (ref 70–99)
Glucose-Capillary: 228 mg/dL — ABNORMAL HIGH (ref 70–99)

## 2020-03-03 MED ORDER — ACYCLOVIR 5 % EX OINT
TOPICAL_OINTMENT | CUTANEOUS | Status: DC
  Administered 2020-03-04 – 2020-03-07 (×6): 1 via TOPICAL
  Filled 2020-03-03: qty 15

## 2020-03-03 NOTE — Progress Notes (Addendum)
PROGRESS NOTE                                                                                                                                                                                                             Patient Demographics:    Gordon Novak, is a 85 y.o. male, DOB - 07/27/1934, IOX:[id number]  Outpatient Primary MD for the patient is [name], MD   Admit date - 02/15/2020   LOS - 17   Brief Narrative: Patient is a 85 y.o. male with PMHx of CAD s/p PCI 2020, DM-2, HLD, HTN, history of PE no longer on anticoagulation-presented with shortness of breath-found to have severe hypoxemia requiring high flow oxygen-due to COVID-19 pneumonia.  COVID-19 vaccinated status: Vaccinated  Significant Events: 9/21>> Admit to [facility] for hypoxia due to COVID-19 pneumonia  Significant studies: 9/21>>Chest x-ray: Diffuse lung infiltrates 9/22>> CTA chest: No PE-severe bilateral patchy/groundglass appearing infiltrates, cholelithiasis 9/22>> bilateral lower extremity Doppler: No DVT 9/22>> Echo: EF 55-60%, RV systolic function normal. 9/30>> chest x-ray: Patchy bilateral airspace disease-not significantly changed from prior.  COVID-19 medications: Steroids: 9/21>> Remdesivir: 9/21>> 9/25 Baricitinib: 9/21>>  Antibiotics: Rocephin: 9/22>> 9/26 Zithromax: 9/22>> 9/26  Microbiology data: 9/21 >>blood culture: No growth  Procedures: None  Consults: Cardiology  DVT prophylaxis: enoxaparin (LOVENOX) injection 40 mg at twice daily dosing (intermediate dosing) SCDs Start: 02/15/20 1300    Subjective:   Patient denies any new complaints this morning.  Shortness of breath is about the same.  Denies any chest pain.  No nausea vomiting.  Good appetite.     Assessment  & Plan :   Acute Hypoxic Resp Failure due to Covid 19 Viral pneumonia Patient was fully vaccinated with the Pfizer vaccine.   Patient has  completed course of Remdesivir.  Remains on steroids and baricitinib.  Has chronic left lower extremity edema.   Patient has had fluctuating course during this hospitalization.  He was improving and then a few days ago he got worse again requiring more oxygen.  Chest x-ray showed subcutaneous emphysema.  CT scan of the chest was done 10/6 which showed pneumomediastinum and subcutaneous emphysema which could be the reason for his worsening.  No pneumothorax was noted.  His opacities were thought to be better compared to previous films. Patient continues to require high flow nasal cannula between 9  to 13 L saturating in the late 80s to early 90s.  Patient seems to have plateaued in terms of his recovery process.  Chest x-ray from this morning shows stable findings.  Continue steroids.  Continue supportive care.  Avoid incentive spirometry for now.   It appears that patient tested positive for COVID-19 on 9/13.  It has been more than 21 days since his positive test.  However he continues to have respiratory issues.  Patient with several comorbidities including diabetes which made him immunosuppressed.  We will extend his isolation for 28 days.  He can likely come off of isolation on October 11.  Pneumomediastinum Noted on CT scan.  Seems to be stable.  Chest x-ray this morning is stable.  Elevated troponins Minimally elevated troponins with flat trend. Not consistent with ACS. Echo with preserved EF.  Cardiology evaluation completed. No recommendations apart from supportive care.    Minimally elevated D-dimer Has a history of PE in 2020 and completed 1 year treatment with Xarelto in May 2021.  CT angiogram chest and Doppler studies negative for VTE.  Continue intermediate dosing with Lovenox.  D-dimer has been improving.  Transaminitis Improved.  Likely secondary to COVID-19. Recheck in the outpatient setting.  Chronic systolic heart failure/hyperkalemia Echocardiogram done during this hospitalization  shows normal systolic function.  Strict ins and outs and daily weights.   Furosemide being continued at a lower dose of 20 mg daily.  Dose was decreased due to low blood pressures.   Continue to monitor electrolytes.  Potassium noted to be normal this morning.  [facility] a potassium of 5.3.     History of CAD Stable.  Seen by cardiology initially due to elevated troponin.  Currently without any chest pain.  Continue medical management.  On aspirin beta-blocker and statin.    DM-2 (A1c 7.1 on 9/21) Noted to have hypoglycemia on 9/30 and dose of Levemir was decreased.  CBGs are noted to be stable.  Continue current dose of Levemir along with SSI.  Gout Continue allopurinol  BPH Continue Flomax  Chronic left lower extremity swelling At baseline. Lower extremity Dopplers were negative.  Compression stockings.  Constipation Patient keeps thinking that he has not had a bowel movement but nurses report multiple bowel movements.  Continue MiraLAX.  Cholelithiasis Incidentally noted on ultrasound of the abdomen.  Asymptomatic.  DVT prophylaxis: Lovenox Family Communication  :  Daughter [name]([name] [phone]).  Updated yesterday.  Will call again today.  Code Status : DNR  Diet :  Diet Order            Diet heart healthy/carb modified Room service appropriate? No; Fluid consistency: Thin  Diet effective now                  Disposition Plan  : Home health recommended by PT/OT  Status is: Inpatient  Remains inpatient appropriate because:Inpatient level of care appropriate due to severity of illness   Dispo: The patient is from: Home              Anticipated d/c is to: Home              Anticipated d/c date is: > 3 days              Patient currently is not medically stable to d/c.  Barriers to discharge: Continues to have high oxygen requirements  Antimicorbials  :    Anti-infectives (From admission, onward)   Start  Dose/Rate Route Frequency Ordered  Stop   02/16/20 1330  azithromycin (ZITHROMAX) 500 mg in sodium chloride 0.9 % 250 mL IVPB        500 mg 250 mL/hr over 60 Minutes Intravenous Every 24 hours 02/16/20 1237 02/20/20 1320   02/16/20 1300  cefTRIAXone (ROCEPHIN) 2 g in sodium chloride 0.9 % 100 mL IVPB        2 g 200 mL/hr over 30 Minutes Intravenous Every 24 hours 02/16/20 1237 02/20/20 1252   02/16/20 1000  remdesivir 100 mg in sodium chloride 0.9 % 100 mL IVPB       "Followed by" Linked Group Details   100 mg 200 mL/hr over 30 Minutes Intravenous Daily 02/15/20 1301 02/19/20 0855   02/15/20 1400  remdesivir 200 mg in sodium chloride 0.9% 250 mL IVPB       "Followed by" Linked Group Details   200 mg 580 mL/hr over 30 Minutes Intravenous Once 02/15/20 1301 02/15/20 1504      Inpatient Medications  Scheduled Meds: . allopurinol  300 mg Oral Daily  . vitamin C  500 mg Oral Daily  . aspirin EC  81 mg Oral Daily  . chlorhexidine  15 mL Mouth Rinse BID  . enoxaparin (LOVENOX) injection  40 mg Subcutaneous BID  . furosemide  20 mg Oral Daily  . insulin aspart  0-15 Units Subcutaneous TID WC  . insulin detemir  5 Units Subcutaneous Daily  . mouth rinse  15 mL Mouth Rinse q12n4p  . methylPREDNISolone (SOLU-MEDROL) injection  40 mg Intravenous Q12H  . mometasone-formoterol  2 puff Inhalation BID  . pantoprazole  40 mg Oral Daily  . polyethylene glycol  17 g Oral BID  . prednisoLONE acetate  1 drop Both Eyes QHS  . rosuvastatin  10 mg Oral Daily  . senna-docusate  2 tablet Oral QHS  . tamsulosin  0.4 mg Oral QHS  . zinc sulfate  220 mg Oral Daily   Continuous Infusions: . sodium chloride 10 mL/hr at 02/17/20 0500   PRN Meds:.acetaminophen, bisacodyl, chlorpheniramine-HYDROcodone, guaiFENesin-dextromethorphan, ondansetron **OR** ondansetron (ZOFRAN) IV, sodium phosphate   See all Orders from today for further details   Osvaldo Shipper M.D on 03/03/2020 at 9:38 AM  To page go to [email or web address] - use universal  password  [facility] -  Office  [phone]    Objective:   Vitals:   03/03/20 0408 03/03/20 0550 03/03/20 0752 03/03/20 0810  BP: (!) 93/54   113/64  Pulse: 79   98  Resp: 18  20   Temp: 97.8 F (36.6 C)   98.5 F (36.9 C)  TempSrc: Oral   Oral  SpO2: 100% 99%  100%  Weight:      Height:        Wt Readings from Last 3 Encounters:  03/01/20 75.3 kg  02/09/20 83.9 kg  08/30/19 83 kg     Intake/Output Summary (Last 24 hours) at 03/03/2020 0938 Last data filed at 03/03/2020 0825 Gross per 24 hour  Intake 120 ml  Output 775 ml  Net -655 ml     Physical Exam  General appearance: Awake alert.  In no distress Resp: Coarse breath sounds bilaterally.  Mildly tachypneic at rest.  Few crackles at the bases.  No wheezing or rhonchi.  No use of accessory muscles. Cardio: S1-S2 is normal regular.  No S3-S4.  No rubs murmurs or bruit GI: Abdomen is soft.  Nontender nondistended.  Bowel sounds are present normal.  No masses  organomegaly Extremities: Edema bilateral lower extremities.  Left greater than right. Neurologic: No focal neurological deficits.        Data Review:    CBC Recent Labs  Lab 02/26/20 0500 02/27/20 0440 03/01/20 0726  WBC 8.4 11.1* 10.4  HGB 13.3 15.0 13.2  HCT 40.2 45.5 39.5  PLT 218 272 174  MCV 92.8 93.8 93.2  MCH 30.7 30.9 31.1  MCHC 33.1 33.0 33.4  RDW 12.8 12.9 13.1    Chemistries  Recent Labs  Lab 02/26/20 0500 02/27/20 0440 02/28/20 0500 02/29/20 0222 03/01/20 0726 03/02/20 0337 03/03/20 0407  NA 136   < > 136 136 139 135 136  K 5.1   < > 5.3* 4.7 4.1 4.1 4.6  CL 99   < > 99 99 99 101 100  CO2 30   < > 30 30 31 29 28   GLUCOSE 112*   < > 185* 99 88 114* 176*  BUN 32*   < > 28* 30* 26* 22 23  CREATININE 0.99   < > 1.09 0.89 0.93 0.81 0.95  CALCIUM 8.0*   < > 8.1* 7.9* 7.9* 7.7* 7.9*  AST 34  --   --   --   --   --  36  ALT 72*  --   --   --   --   --  67*  ALKPHOS 77  --   --   --   --   --  74  BILITOT 1.0  --    --   --   --   --  0.8   < > = values in this interval not displayed.     Recent Labs    03/01/20 0726  DDIMER 0.85*        Component Value Date/Time   BNP 199.1 (H) 02/15/2020 1040    Micro Results No results found for this or any previous visit (from the past 240 hour(s)).  Radiology Reports CT Head Wo Contrast  Result Date: 02/09/2020 CLINICAL DATA:  Trauma, fall on blood thinners EXAM: CT HEAD WITHOUT CONTRAST CT CERVICAL SPINE WITHOUT CONTRAST TECHNIQUE: Multidetector CT imaging of the head and cervical spine was performed following the standard protocol without intravenous contrast. Multiplanar CT image reconstructions of the cervical spine were also generated. COMPARISON:  None. FINDINGS: CT HEAD FINDINGS Brain: No acute territorial infarction, hemorrhage, or intracranial mass. Moderate atrophy. Mild hypodensity in the white matter consistent with chronic small vessel ischemic change. The ventricles are nonenlarged. Vascular: No hyperdense vessels.  Carotid vascular calcification. Skull: No depressed skull fracture. Sinuses/Orbits: Mucosal thickening in the maxillary and ethmoid sinuses. Small fluid level in the left sphenoid sinus but no central skull base lucency is seen. Possible chronic left nasal bone fracture. Other: Mild right posterior scalp swelling. CT CERVICAL SPINE FINDINGS Alignment: No subluxation.  Facet alignment within normal limits. Skull base and vertebrae: No acute fracture. No primary bone lesion or focal pathologic process. Soft tissues and spinal canal: No prevertebral fluid or swelling. No visible canal hematoma. Disc levels: Multiple level degenerative change with moderate severe disease at C5-C6 and C6-C7. Facet degenerative changes at multiple levels with foraminal narrowing. Upper chest: Apical scarring Other: None IMPRESSION: 1. No CT evidence for acute intracranial abnormality. Atrophy and mild chronic small vessel ischemic changes of the white matter. 2.  Degenerative changes of the cervical spine. No acute osseous abnormality. Electronically Signed   By: Jasmine Pang M.D.   On: 02/09/2020 01:13  CT CHEST W CONTRAST  Result Date: 03/01/2020 CLINICAL DATA:  COVID pneumonia. EXAM: CT CHEST WITH CONTRAST TECHNIQUE: Multidetector CT imaging of the chest was performed during intravenous contrast administration. CONTRAST:  65mL OMNIPAQUE IOHEXOL 300 MG/ML  SOLN COMPARISON:  Chest CT 02/16/2020 FINDINGS: Cardiovascular: The heart is normal in size. No pericardial effusion. The aorta is normal in caliber. No dissection or focal aneurysm. Stable moderate atherosclerotic calcifications. The branch vessels are patent. Stable three-vessel coronary artery calcifications. The pulmonary arteries are fairly well opacified. No filling defects to suggest pulmonary embolism. Mediastinum/Nodes: Fairly extensive mediastinal air. This is dissecting up into the neck and all along the chest wall. Mild pneumopericardium also noted. Gas is dissecting into the intercostal muscles. There is a small left basilar pneumothorax and probable small anterior pneumothoraces but no large pneumothorax requiring chest tube placement. The esophagus is grossly normal. Lungs/Pleura: Vague diffuse patchy ground-glass infiltrates consistent with known COVID pneumonia. Overall definite improvement since the prior CT scan. Upper Abdomen: No significant upper abdominal findings. Numerous gallstones are again noted in the gallbladder. Musculoskeletal: No acute bony findings. Remote healed right rib fractures are noted. IMPRESSION: 1. Fairly extensive mediastinal air dissecting up into the neck and all along the chest wall. 2. Small left basilar pneumothorax and probable small anterior pneumothoraces but no large pneumothorax. 3. Persistent diffuse patchy ground-glass infiltrates consistent with known COVID pneumonia. Overall definite improvement since the prior CT scan. 4. Stable three-vessel coronary artery  calcifications. 5. Cholelithiasis. 6. Aortic atherosclerosis. Aortic Atherosclerosis (ICD10-I70.0). Electronically Signed   By: Rudie Meyer M.D.   On: 03/01/2020 14:30   CT ANGIO CHEST PE W OR WO CONTRAST  Result Date: 02/16/2020 CLINICAL DATA:  COVID positive peer EXAM: CT ANGIOGRAPHY CHEST WITH CONTRAST TECHNIQUE: Multidetector CT imaging of the chest was performed using the standard protocol during bolus administration of intravenous contrast. Multiplanar CT image reconstructions and MIPs were obtained to evaluate the vascular anatomy. CONTRAST:  OMNIPAQUE IOHEXOL 350 MG/ML SOLN COMPARISON:  Oct 22, 2018 FINDINGS: Cardiovascular: There is moderate severity calcification of the thoracic aorta. Satisfactory opacification of the pulmonary arteries to the segmental level. No evidence of pulmonary embolism. Normal heart size. No pericardial effusion. Mediastinum/Nodes: No enlarged mediastinal, hilar, or axillary lymph nodes. Thyroid gland, trachea, and esophagus demonstrate no significant findings. Lungs/Pleura: Marked severity patchy and ground-glass appearing infiltrates are seen throughout both lungs. There is no evidence of a pleural effusion or pneumothorax. Upper Abdomen: Numerous subcentimeter gallstones are seen within the lumen of an otherwise normal-appearing gallbladder. Musculoskeletal: No chest wall abnormality. No acute or significant osseous findings. Review of the MIP images confirms the above findings. IMPRESSION: 1. Marked severity bilateral patchy and ground-glass appearing infiltrates. 2. Cholelithiasis. 3. Aortic atherosclerosis. Aortic Atherosclerosis (ICD10-I70.0). Electronically Signed   By: Aram Candela M.D.   On: 02/16/2020 22:13   CT Cervical Spine Wo Contrast  Result Date: 02/09/2020 CLINICAL DATA:  Trauma, fall on blood thinners EXAM: CT HEAD WITHOUT CONTRAST CT CERVICAL SPINE WITHOUT CONTRAST TECHNIQUE: Multidetector CT imaging of the head and cervical spine was  performed following the standard protocol without intravenous contrast. Multiplanar CT image reconstructions of the cervical spine were also generated. COMPARISON:  None. FINDINGS: CT HEAD FINDINGS Brain: No acute territorial infarction, hemorrhage, or intracranial mass. Moderate atrophy. Mild hypodensity in the white matter consistent with chronic small vessel ischemic change. The ventricles are nonenlarged. Vascular: No hyperdense vessels.  Carotid vascular calcification. Skull: No depressed skull fracture. Sinuses/Orbits: Mucosal thickening in the maxillary and  ethmoid sinuses. Small fluid level in the left sphenoid sinus but no central skull base lucency is seen. Possible chronic left nasal bone fracture. Other: Mild right posterior scalp swelling. CT CERVICAL SPINE FINDINGS Alignment: No subluxation.  Facet alignment within normal limits. Skull base and vertebrae: No acute fracture. No primary bone lesion or focal pathologic process. Soft tissues and spinal canal: No prevertebral fluid or swelling. No visible canal hematoma. Disc levels: Multiple level degenerative change with moderate severe disease at C5-C6 and C6-C7. Facet degenerative changes at multiple levels with foraminal narrowing. Upper chest: Apical scarring Other: None IMPRESSION: 1. No CT evidence for acute intracranial abnormality. Atrophy and mild chronic small vessel ischemic changes of the white matter. 2. Degenerative changes of the cervical spine. No acute osseous abnormality. Electronically Signed   By: Jasmine Pang M.D.   On: 02/09/2020 01:13   DG Chest Port 1 View  Result Date: 03/03/2020 CLINICAL DATA:  COVID-19 pneumonia EXAM: PORTABLE CHEST 1 VIEW COMPARISON:  03/01/2020 FINDINGS: Was 624 hours. The cardiopericardial silhouette is within normal limits for size. Diffuse bilateral airspace disease is similar. Extensive pneumomediastinum and subcutaneous emphysema in the upper chest again noted. No substantial pleural effusion. The  visualized bony structures of the thorax show no acute abnormality. Telemetry leads overlie the chest. IMPRESSION: No substantial change in exam. Diffuse bilateral airspace disease with extensive pneumomediastinum and subcutaneous emphysema in the upper chest. No pneumothorax. Electronically Signed   By: Kennith Center M.D.   On: 03/03/2020 07:42   DG Chest Port 1 View  Result Date: 03/01/2020 CLINICAL DATA:  Pneumonia due to COVID-19 EXAM: PORTABLE CHEST 1 VIEW COMPARISON:  Two days ago FINDINGS: Greater chest wall emphysema. Pneumomediastinum. There is biapical extrapleural gas versus trace pneumothorax, unchanged. Bilateral pulmonary infiltrate. Normal heart size. IMPRESSION: 1. Progressive chest wall emphysema. 2. Small biapical pneumothorax versus extrapleural gas, interval stability favoring the latter. 3. Stable infiltrates. Electronically Signed   By: Marnee Spring M.D.   On: 03/01/2020 07:13   DG CHEST PORT 1 VIEW  Result Date: 02/28/2020 CLINICAL DATA:  Respiratory failure, COVID-19 positive, chest tightness EXAM: PORTABLE CHEST 1 VIEW COMPARISON:  02/24/2020 FINDINGS: Stable cardiomediastinal contours. Atherosclerotic calcification of the aortic knob. Subtle airspace opacities bilaterally with a predominantly peripheral and basilar distribution, not appreciably changed from prior. No pleural effusion. No evidence of pneumothorax. Interval development of subcutaneous emphysema most pronounced at the base of the right neck and within the right axillary region. IMPRESSION: 1. Interval development of subcutaneous emphysema at the base of the right neck and within the right axillary region. No evidence of pneumothorax. Correlate with physical exam and for history of attempted central line placement. 2. Stable bilateral peripheral and basilar airspace opacities. Electronically Signed   By: Duanne Guess D.O.   On: 02/28/2020 13:07   DG Chest Port 1 View  Result Date: 02/15/2020 CLINICAL DATA:   COVID positive.  Hypoxia. EXAM: PORTABLE CHEST 1 VIEW COMPARISON:  Oct 22, 2018 FINDINGS: Diffuse bilateral interstitial and airspace opacities, new from prior. No visible pleural effusion. No pneumothorax. Cardiac silhouette is accentuated by low lung volumes and portable technique. Aortic atherosclerosis. IMPRESSION: Diffuse bilateral interstitial and airspace opacities, likely multifocal pneumonia given the clinical history. Electronically Signed   By: Feliberto Harts MD   On: 02/15/2020 10:56   DG Chest Port 1V same Day  Result Date: 02/24/2020 CLINICAL DATA:  COVID EXAM: PORTABLE CHEST 1 VIEW COMPARISON:  02/15/2020 FINDINGS: Bilateral patchy airspace opacities are again noted, similar  prior study. Slight increase in lung volumes. Heart is normal size. No effusions or pneumothorax. No acute bony abnormality. IMPRESSION: Patchy bilateral airspace disease, not significantly changed since prior study. Slight increased lung volumes. Electronically Signed   By: Charlett Nose M.D.   On: 02/24/2020 08:31   VAS [place] LOWER EXTREMITY VENOUS (DVT)  Result Date: 02/16/2020  Lower Venous DVTStudy Indications: Swelling, and elevated d dimer.  Risk Factors: + covid. Performing Technologist: Jannet Askew RCT RDMS  Examination Guidelines: A complete evaluation includes B-mode imaging, spectral Doppler, color Doppler, and power Doppler as needed of all accessible portions of each vessel. Bilateral testing is considered an integral part of a complete examination. Limited examinations for reoccurring indications may be performed as noted. The reflux portion of the exam is performed with the patient in reverse Trendelenburg.  +---------+---------------+---------+-----------+----------+--------------+ RIGHT    CompressibilityPhasicitySpontaneityPropertiesThrombus Aging +---------+---------------+---------+-----------+----------+--------------+ CFV      Full           Yes      Yes                                  +---------+---------------+---------+-----------+----------+--------------+ SFJ      Full                                                        +---------+---------------+---------+-----------+----------+--------------+ FV Prox  Full                                                        +---------+---------------+---------+-----------+----------+--------------+ FV Mid   Full                                                        +---------+---------------+---------+-----------+----------+--------------+ FV DistalFull                                                        +---------+---------------+---------+-----------+----------+--------------+ PFV      Full                                                        +---------+---------------+---------+-----------+----------+--------------+ POP      Full           Yes      Yes                                 +---------+---------------+---------+-----------+----------+--------------+ PTV      Full                                                        +---------+---------------+---------+-----------+----------+--------------+  PERO     Full                                                        +---------+---------------+---------+-----------+----------+--------------+   +---------+---------------+---------+-----------+----------+--------------+ LEFT     CompressibilityPhasicitySpontaneityPropertiesThrombus Aging +---------+---------------+---------+-----------+----------+--------------+ CFV      Full           Yes      Yes                                 +---------+---------------+---------+-----------+----------+--------------+ SFJ      Full                                                        +---------+---------------+---------+-----------+----------+--------------+ FV Prox  Full                                                         +---------+---------------+---------+-----------+----------+--------------+ FV Mid   Full                                                        +---------+---------------+---------+-----------+----------+--------------+ FV DistalFull                                                        +---------+---------------+---------+-----------+----------+--------------+ PFV      Full                                                        +---------+---------------+---------+-----------+----------+--------------+ POP      Full           Yes      Yes                                 +---------+---------------+---------+-----------+----------+--------------+ PTV      Full                                                        +---------+---------------+---------+-----------+----------+--------------+ PERO     Full                                                        +---------+---------------+---------+-----------+----------+--------------+  Summary: RIGHT: - There is no evidence of deep vein thrombosis in the lower extremity.  - No cystic structure found in the popliteal fossa.  LEFT: - There is no evidence of deep vein thrombosis in the lower extremity.  - No cystic structure found in the popliteal fossa.  *See table(s) above for measurements and observations. Electronically signed by Coral Else MD on 02/16/2020 at 9:33:24 PM.    Final    ECHOCARDIOGRAM LIMITED  Result Date: 02/16/2020    ECHOCARDIOGRAM LIMITED REPORT   Patient Name:   ARIZONA NORDQUIST Date of Exam: 02/16/2020 Medical Rec #:  [id number]       Height:       72.0 in Accession #:    [id number]      Weight:       183.0 lb Date of Birth:  Dec 31, 1934       BSA:          2.052 m Patient Age:    84 years        BP:           114/53 mmHg Patient Gender: M               HR:           77 bpm. Exam Location:  Inpatient Procedure: Limited Echo, Cardiac Doppler and Color Doppler Indications:    Elevated Troponin  History:         Patient has prior history of Echocardiogram examinations, most                 recent 02/17/2019. CHF and Cardiomyopathy, CAD, Arrythmias:Atrial                 Fibrillation, Signs/Symptoms:Shortness of Breath and Fever; Risk                 Factors:Hypertension, Dyslipidemia, OBESITY and Diabetes.                 COVID+.  Sonographer:    Lavenia Atlas Referring Phys: [id number] Kasandra Knudsen PAHWANI IMPRESSIONS  1. Left ventricular ejection fraction, by estimation, is 55 to 60%.  2. Right ventricular systolic function is normal. The right ventricular size is normal. There is normal pulmonary artery systolic pressure.  3. The interatrial septum is aneuyrsmal with frequent bowing from right to left.  4. The mitral valve is normal in structure. Trivial mitral valve regurgitation.  5. There is mild calcification of the aortic valve. There is mild thickening of the aortic valve. Mild aortic valve sclerosis is present, with no evidence of aortic valve stenosis. Comparison(s): Compared to prior echo on 01/2019, the LVEF appears mildly improved to 55-60%. FINDINGS  Left Ventricle: Septal motion is abnormal consistent with prior surgery. Left ventricular ejection fraction, by estimation, is 55 to 60%. The left ventricular internal cavity size was normal in size. There is no left ventricular hypertrophy. Right Ventricle: The right ventricular size is normal. Right ventricular systolic function is normal. There is normal pulmonary artery systolic pressure. The tricuspid regurgitant velocity is 2.41 m/s, and with an assumed right atrial pressure of 3 mmHg,  the estimated right ventricular systolic pressure is 26.2 mmHg. Left Atrium: Left atrial size was normal in size. Pericardium: There is no evidence of pericardial effusion. Mitral Valve: The mitral valve is normal in structure. There is mild thickening of the mitral valve leaflet(s). Mild mitral annular calcification. Trivial mitral valve regurgitation. Tricuspid Valve: The  tricuspid valve is normal in structure. Tricuspid  valve regurgitation is mild. Aortic Valve: There is mild calcification of the aortic valve. There is mild thickening of the aortic valve. Mild aortic valve sclerosis is present, with no evidence of aortic valve stenosis. Pulmonic Valve: The pulmonic valve was not assessed. Aorta: The aortic root is normal in size and structure. LEFT VENTRICLE PLAX 2D LVIDd:         3.80 cm  Diastology LVIDs:         2.70 cm  LV e' medial:    7.51 cm/s LV PW:         1.20 cm  LV E/e' medial:  7.7 LV IVS:        0.90 cm  LV e' lateral:   5.98 cm/s LVOT diam:     1.90 cm  LV E/e' lateral: 9.7 LVOT Area:     2.84 cm  RIGHT VENTRICLE RV S prime:     13.30 cm/s LEFT ATRIUM         Index LA diam:    2.80 cm 1.36 cm/m   AORTA Ao Root diam: 2.50 cm MITRAL VALVE               TRICUSPID VALVE MV Area (PHT): 5.38 cm    TR Peak grad:   23.2 mmHg MV Decel Time: 141 msec    TR Vmax:        241.00 cm/s MV E velocity: 57.80 cm/s MV A velocity: 70.30 cm/s  SHUNTS MV E/A ratio:  0.82        Systemic Diam: 1.90 cm Laurance Flatten MD Electronically signed by Laurance Flatten MD Signature Date/Time: 02/16/2020/4:23:30 PM    Final    ABORTED INVASIVE LAB PROCEDURE  Result Date: 02/15/2020 This case was aborted.  US Abdomen Limited RUQ  Result Date: 02/15/2020 CLINICAL DATA:  Elevated liver enzymes. EXAM: ULTRASOUND ABDOMEN LIMITED RIGHT UPPER QUADRANT COMPARISON:  CT abdomen 07/06/2019. FINDINGS: Gallbladder: Gallstones measuring up to 7 mm noted. Gallbladder wall thickness normal. No Murphy sign. Common bile duct: Diameter: Not visualized due to overlying bowel gas. Liver: Left no not visualized due to overlying bowel gas. No focal lesion identified. Within normal limits in parenchymal echogenicity. Portal vein is patent on color Doppler imaging with normal direction of blood flow towards the liver. Other: Limited study due to overlying bowel gas. IMPRESSION: 1.  Multiple small gallstones.   No gallbladder wall thickening. 2. Limited exam due to overlying bowel gas. Common bile duct not visualized. Portions of the liver not visualized. Electronically Signed   By: Maisie Fus  Register   On: 02/15/2020 15:35

## 2020-03-03 NOTE — Plan of Care (Signed)
Patient is currently resting in bed. Denies pain. VSS. On 10L HFNC, continue to wean oxygen. 1 assist in room.  Encouraged repositioning. Bed alarm on. Call bell within reach. No complaints overnight.   Problem: Education: Goal: Knowledge of General Education information will improve Description: Including pain rating scale, medication(s)/side effects and non-pharmacologic comfort measures Outcome: Progressing   Problem: Health Behavior/Discharge Planning: Goal: Ability to manage health-related needs will improve Outcome: Progressing   Problem: Clinical Measurements: Goal: Ability to maintain clinical measurements within normal limits will improve Outcome: Progressing Goal: Will remain free from infection Outcome: Progressing Goal: Diagnostic test results will improve Outcome: Progressing Goal: Respiratory complications will improve Outcome: Progressing Goal: Cardiovascular complication will be avoided Outcome: Progressing   Problem: Activity: Goal: Risk for activity intolerance will decrease Outcome: Progressing   Problem: Nutrition: Goal: Adequate nutrition will be maintained Outcome: Progressing   Problem: Coping: Goal: Level of anxiety will decrease Outcome: Progressing   Problem: Elimination: Goal: Will not experience complications related to bowel motility Outcome: Progressing Goal: Will not experience complications related to urinary retention Outcome: Progressing   Problem: Pain Managment: Goal: General experience of comfort will improve Outcome: Progressing   Problem: Safety: Goal: Ability to remain free from injury will improve Outcome: Progressing   Problem: Skin Integrity: Goal: Risk for impaired skin integrity will decrease Outcome: Progressing

## 2020-03-03 NOTE — Progress Notes (Signed)
Physical Therapy Treatment Patient Details Name: Gordon Novak MRN: [id number] DOB: Aug 14, 1934 Today's Date: 03/03/2020    History of Present Illness Pt is an 85 y.o. male who tested (+) COVID-19 on 02/07/20, now admitted 02/15/20 with hypoxia; workup for acute hypoxemic respiratory failure due to COVID-19 PNA with severe lung parenchymal injury. PMH includes CAD, HTN, HLD, DM2, PE; pt fully vaccinated.    PT Comments    Pt walked with OT earlier, however pt always willing to work with therapy. Focus of session maintaining SaO2 through purse lip breathing during bouts of standing. Pt is min guard with sit<>stand x5 and able to stand for ~ 1 min each bout. Pt with purse lip breathing through out and able to maintain SaO2 >82%O2 and recover within 1-2 min. With final bout pt able to maintain SaO2 >90% through standing and sitting back down. Pt very pleased with results. D/c plans remain appropriate. PT will continue to follow acutely.      Follow Up Recommendations  Home health PT;Supervision for mobility/OOB     Equipment Recommendations  Rolling walker with 5" wheels;3in1 (PT)       Precautions / Restrictions Precautions Precautions: Fall Precaution Comments: Quick to desaturate  Restrictions Weight Bearing Restrictions: No    Mobility  Bed Mobility               General bed mobility comments: In recliner upon arrival  Transfers Overall transfer level: Needs assistance Equipment used: Rolling walker (2 wheeled) Transfers: Sit to/from Stand Sit to Stand: Min guard         General transfer comment: Min Guard A for safety, with 5x sit<>stand, able to maintain standing for 1 min each  Ambulation/Gait             General Gait Details: pt walked earlier with OT          Balance Overall balance assessment: Needs assistance Sitting-balance support: No upper extremity supported;Feet supported Sitting balance-Leahy Scale: Good     Standing balance support:  During functional activity;No upper extremity supported Standing balance-Leahy Scale: Fair Standing balance comment: able to statically stand without assist                            Cognition Arousal/Alertness: Awake/alert Behavior During Therapy: WFL for tasks assessed/performed Overall Cognitive Status: Within Functional Limits for tasks assessed                                 General Comments: WFL for simple tasks, following all commands. Having to repeat at times due to [facility]      Exercises Other Exercises Other Exercises: 5x sit<>stand with 1 min bout of standing     General Comments General comments (skin integrity, edema, etc.): Pt on 10 L HFNC on entry with SaO2 96%O2, SaO2 dropped to mid 80s with standing x 4 however on last attempt pt able to maintain SaO2 >90% through 1 min bout of standing HR 123 max with standing       Pertinent Vitals/Pain Pain Assessment: No/denies pain Faces Pain Scale: No hurt Pain Intervention(s): Monitored during session           PT Goals (current goals can now be found in the care plan section) Acute Rehab PT Goals Patient Stated Goal: "I want to get back home as soon as I can... I hope our traveling days  have not come to an end" PT Goal Formulation: With patient Time For Goal Achievement: 03/08/20 Potential to Achieve Goals: Fair Progress towards PT goals: Progressing toward goals    Frequency    Min 3X/week      PT Plan Current plan remains appropriate       AM-PAC PT "6 Clicks" Mobility   Outcome Measure  Help needed turning from your back to your side while in a flat bed without using bedrails?: None Help needed moving from lying on your back to sitting on the side of a flat bed without using bedrails?: None Help needed moving to and from a bed to a chair (including a wheelchair)?: A Little Help needed standing up from a chair using your arms (e.g., wheelchair or bedside chair)?: A Little Help  needed to walk in hospital room?: A Little Help needed climbing 3-5 steps with a railing? : A Lot 6 Click Score: 19    End of Session Equipment Utilized During Treatment: Oxygen Activity Tolerance: Patient tolerated treatment well;Treatment limited secondary to medical complications (Comment) Patient left: in chair;with call bell/phone within reach Nurse Communication: Mobility status PT Visit Diagnosis: Other abnormalities of gait and mobility (R26.89)     Time: 1314-3888 PT Time Calculation (min) (ACUTE ONLY): 26 min  Charges:  $Therapeutic Exercise: 23-37 mins                     Gordon Novak B. Beverely Risen PT, DPT Acute Rehabilitation Services Pager [phone] Office [phone]    Gordon Novak 03/03/2020, 5:07 PM

## 2020-03-03 NOTE — Progress Notes (Signed)
Occupational Therapy Treatment Patient Details Name: Gordon Novak MRN: [id number] DOB: [birth year] Today's Date: 03/03/2020    History of present illness Pt is an 85 y.o. male who tested (+) COVID-19 on 02/07/20, now admitted 02/15/20 with hypoxia; workup for acute hypoxemic respiratory failure due to COVID-19 PNA with severe lung parenchymal injury. PMH includes CAD, HTN, HLD, DM2, PE; pt fully vaccinated.   OT comments  Pt progressing towards established OT goals and demonstrating increasing activity tolerance. Pt SpO2 90s on 11L HFNC, HR 101, and RR 20s while seated in recliner. Pt performing short distance functional mobility with Min Guard A, RW, and taking seated rest breaks; 6 feet, 4 feet, 2 feet, and then 12 feet to return to recliner. SpO2 dropping to 72% on 10L HFNC and maintaining >83% on 15L via HFNC. HR elevating to 120s in standing. Continue to recommend dc to home with HHOT and will continue to follow acutely as admitted.    Follow Up Recommendations  Home health OT;Supervision/Assistance - 24 hour    Equipment Recommendations  3 in 1 bedside commode    Recommendations for Other Services      Precautions / Restrictions Precautions Precautions: Fall Precaution Comments: Quick to desaturate        Mobility Bed Mobility               General bed mobility comments: In recliner upon arrival  Transfers Overall transfer level: Needs assistance Equipment used: Rolling walker (2 wheeled) Transfers: Sit to/from Stand Sit to Stand: Min guard         General transfer comment: Min Guard A for safety    Balance Overall balance assessment: Needs assistance Sitting-balance support: No upper extremity supported;Feet supported Sitting balance-Leahy Scale: Good     Standing balance support: During functional activity;No upper extremity supported Standing balance-Leahy Scale: Poor                             ADL either performed or assessed with  clinical judgement   ADL Overall ADL's : Needs assistance/impaired                     Lower Body Dressing: Min guard;Sit to/from stand Lower Body Dressing Details (indicate cue type and reason): Slipping on his bedroom shoes. Bending forward to adjust shoe position Toilet Transfer: Min Chief of Staff Details (indicate cue type and reason): Min Guard A for safety.          Functional mobility during ADLs: Min guard;Rolling walker General ADL Comments: Pt performing short distance mobility in his room.      Vision       Perception     Praxis      Cognition Arousal/Alertness: Awake/alert Behavior During Therapy: WFL for tasks assessed/performed Overall Cognitive Status: Within Functional Limits for tasks assessed                                 General Comments: WFL for simple tasks, following all commands. Having to repeat at times due to [facility]        Exercises Exercises: Other exercises Other Exercises Other Exercises: Purse lip breathing   Shoulder Instructions       General Comments HR elevating to 120s with standing. SpO2 72% on 10L HFNC after 6 ft. SpO2 dropping to 85% on 15L HFNC after additional 4 ft. Additional 2  ft and SpO2 84% on 15L. Performing ~12 feet back to recliner maintaining SpO2 >83% on 15L HFNC.     Pertinent Vitals/ Pain       Pain Assessment: Faces Faces Pain Scale: No hurt Pain Intervention(s): Monitored during session  Home Living                                          Prior Functioning/Environment              Frequency  Min 2X/week        Progress Toward Goals  OT Goals(current goals can now be found in the care plan section)  Progress towards OT goals: Progressing toward goals  Acute Rehab OT Goals Patient Stated Goal: "I want to get back home as soon as I can... I hope our traveling days have not come to an end" OT Goal Formulation: With patient Time  For Goal Achievement: 03/08/20 Potential to Achieve Goals: Good ADL Goals Pt Will Perform Lower Body Bathing: with modified independence;sitting/lateral leans;sit to/from stand Pt Will Perform Lower Body Dressing: with modified independence;sitting/lateral leans;sit to/from stand Pt Will Transfer to Toilet: with modified independence;ambulating;regular height toilet Pt Will Perform Tub/Shower Transfer: Shower transfer;shower seat;ambulating;with modified independence Pt/caregiver will Perform Home Exercise Program: Increased strength;Both right and left upper extremity;With written HEP provided Additional ADL Goal #1: Pt will recall and apply 3-5 energy conservation strategies to apply to ADL routine Additional ADL Goal #2: Pt will self monitor and maintain SpO2 >88% during ADL mobility  Plan Discharge plan remains appropriate    Co-evaluation                 AM-PAC OT "6 Clicks" Daily Activity     Outcome Measure   Help from another person eating meals?: A Little Help from another person taking care of personal grooming?: A Little Help from another person toileting, which includes using toliet, bedpan, or urinal?: A Little Help from another person bathing (including washing, rinsing, drying)?: A Little Help from another person to put on and taking off regular upper body clothing?: A Little Help from another person to put on and taking off regular lower body clothing?: A Little 6 Click Score: 18    End of Session Equipment Utilized During Treatment: Rolling walker;Oxygen (15L HFNC)  OT Visit Diagnosis: Unsteadiness on feet (R26.81);Other abnormalities of gait and mobility (R26.89);Muscle weakness (generalized) (M62.81)   Activity Tolerance Patient tolerated treatment well   Patient Left in chair;with call bell/phone within reach;Other (comment) (11L O2 via HFNC)   Nurse Communication Mobility status        Time: 1355-1426 OT Time Calculation (min): 31 min  Charges:  OT General Charges $OT Visit: 1 Visit OT Treatments $Therapeutic Activity: 23-37 mins  Ashaunte Standley MSOT, OTR/L Acute Rehab Pager: [phone] Office: [phone]   Theodoro Grist Lanayah Gartley 03/03/2020, 4:17 PM

## 2020-03-04 DIAGNOSIS — U071 COVID-19: Secondary | ICD-10-CM | POA: Diagnosis not present

## 2020-03-04 DIAGNOSIS — J9601 Acute respiratory failure with hypoxia: Secondary | ICD-10-CM | POA: Diagnosis not present

## 2020-03-04 LAB — BRAIN NATRIURETIC PEPTIDE: B Natriuretic Peptide: 44.5 pg/mL (ref 0.0–100.0)

## 2020-03-04 LAB — CBC
HCT: 33.6 % — ABNORMAL LOW (ref 39.0–52.0)
Hemoglobin: 11.5 g/dL — ABNORMAL LOW (ref 13.0–17.0)
MCH: 31.7 pg (ref 26.0–34.0)
MCHC: 34.2 g/dL (ref 30.0–36.0)
MCV: 92.6 fL (ref 80.0–100.0)
Platelets: 128 10*3/uL — ABNORMAL LOW (ref 150–400)
RBC: 3.63 MIL/uL — ABNORMAL LOW (ref 4.22–5.81)
RDW: 12.8 % (ref 11.5–15.5)
WBC: 8.6 10*3/uL (ref 4.0–10.5)
nRBC: 0 % (ref 0.0–0.2)

## 2020-03-04 LAB — BASIC METABOLIC PANEL
Anion gap: 7 (ref 5–15)
BUN: 19 mg/dL (ref 8–23)
CO2: 28 mmol/L (ref 22–32)
Calcium: 7.6 mg/dL — ABNORMAL LOW (ref 8.9–10.3)
Chloride: 102 mmol/L (ref 98–111)
Creatinine, Ser: 0.77 mg/dL (ref 0.61–1.24)
GFR, Estimated: >60 mL/min (ref >60)
Glucose, Bld: 117 mg/dL — ABNORMAL HIGH (ref 70–99)
Potassium: 4.2 mmol/L (ref 3.5–5.1)
Sodium: 137 mmol/L (ref 135–145)

## 2020-03-04 LAB — GLUCOSE, CAPILLARY: Glucose-Capillary: 184 mg/dL — ABNORMAL HIGH (ref 70–99)

## 2020-03-04 NOTE — Progress Notes (Signed)
Spoke to daughter and gave an update. No more questions at this time.

## 2020-03-04 NOTE — Progress Notes (Signed)
PROGRESS NOTE                                                                                                                                                                                                             Patient Demographics:    Gordon Novak, is a 85 y.o. male, DOB - 08/26/1934, ZOX:[id number]  Outpatient Primary MD for the patient is Barbie BannerWilson, Fred H, MD   Admit date - 02/15/2020   LOS - 18   Brief Narrative: Patient is a 85 y.o. male with PMHx of CAD s/p PCI 2020, DM-2, HLD, HTN, history of PE no longer on anticoagulation-presented with shortness of breath-found to have severe hypoxemia requiring high flow oxygen-due to COVID-19 pneumonia.  COVID-19 vaccinated status: Vaccinated  Significant Events: 9/21>> Admit to [facility] for hypoxia due to COVID-19 pneumonia  Significant studies: 9/21>>Chest x-ray: Diffuse lung infiltrates 9/22>> CTA chest: No PE-severe bilateral patchy/groundglass appearing infiltrates, cholelithiasis 9/22>> bilateral lower extremity Doppler: No DVT 9/22>> Echo: EF 55-60%, RV systolic function normal. 9/30>> chest x-ray: Patchy bilateral airspace disease-not significantly changed from prior.  COVID-19 medications: Steroids: 9/21>> Remdesivir: 9/21>> 9/25 Baricitinib: 9/21>>  Antibiotics: Rocephin: 9/22>> 9/26 Zithromax: 9/22>> 9/26  Microbiology data: 9/21 >>blood culture: No growth  Procedures: None  Consults: Cardiology  DVT prophylaxis: enoxaparin (LOVENOX) injection 40 mg at twice daily dosing (intermediate dosing) SCDs Start: 02/15/20 1300    Subjective:   Patient in bed, appears comfortable, denies any headache, no fever, no chest pain or pressure, mild shortness of breath , no abdominal pain. No focal weakness.    Assessment  & Plan :   Acute Hypoxic Resp Failure due to Covid 19 Viral pneumonia - Patient was fully vaccinated with the Pfizer vaccine.  He is  being treated with combination of IV steroids, Remdesivir and Baricitinib, had incurred severe parenchymal lung injury, clinical deterioration seem to have stabilized, still tenuous but slightly improved.  Continue to monitor clinical course, encouraged to sit up in chair in daytime use I-S and flutter valve in daytime.  SpO2: 95 % O2 Flow Rate (L/min): 8 L/min FiO2 (%): 50 %   Recent Labs  Lab 02/27/20 0440 03/01/20 0726 03/03/20 0407 03/04/20 0433  WBC 11.1* 10.4  --  8.6  DDIMER  --  0.85*  --   --   AST  --   --  36  --   ALT  --   --  67*  --   ALKPHOS  --   --  74  --   BILITOT  --   --  0.8  --   ALBUMIN  --   --  2.1*  --        Pneumomediastinum -  Noted on CT scan.  Seems to be stable.  Chest x-ray this morning is stable.  Elevated troponins Minimally elevated troponins with flat trend. Not consistent with ACS. Echo with preserved EF.  Cardiology evaluation completed. No recommendations apart from supportive care.    Minimally elevated D-dimer Has a history of PE in 2020 and completed 1 year treatment with Xarelto in May 2021.  CT angiogram chest and Doppler studies negative for VTE.  Continue intermediate dosing with Lovenox.  D-dimer has been improving.  Transaminitis Improved.  Likely secondary to COVID-19. Recheck in the outpatient setting.  Chronic Diastolic heart failure  Echocardiogram done during this hospitalization shows normal systolic function.  Strict ins and outs and daily weights.   Furosemide being continued at a lower dose of 20 mg daily.  Dose was decreased due to low blood pressures.   Continue to monitor electrolytes.      History of CAD Stable.  Seen by cardiology initially due to elevated troponin.  Currently without any chest pain.  Continue medical management.  On aspirin beta-blocker and statin.     Gout Continue allopurinol  BPH Continue Flomax  Chronic left lower extremity swelling At baseline. Lower extremity Dopplers were  negative.  Compression stockings.  Constipation Patient keeps thinking that he has not had a bowel movement but nurses report multiple bowel movements.  Continue MiraLAX.  Cholelithiasis Incidentally noted on ultrasound of the abdomen.  Asymptomatic.  DM-2 (A1c 7.1 on 9/21) Noted to have hypoglycemia on 9/30 and dose of Levemir was decreased.  CBGs are noted to be stable.  Continue current dose of Levemir along with SSI. CBG (last 3)  Recent Labs    03/03/20 0740 03/03/20 1152 03/03/20 1603  GLUCAP 135* 116* 228*     DVT prophylaxis: Lovenox Family Communication  :  Daughter [name] [phone]).  Message left on 03/04/2020 at 2:03 PM.  Code Status : DNR  Diet :  Diet Order            Diet heart healthy/carb modified Room service appropriate? No; Fluid consistency: Thin  Diet effective now                  Disposition Plan  : Home health recommended by PT/OT  Status is: Inpatient  Remains inpatient appropriate because:Inpatient level of care appropriate due to severity of illness   Dispo: The patient is from: Home              Anticipated d/c is to: Home              Anticipated d/c date is: > 3 days              Patient currently is not medically stable to d/c.  Barriers to discharge: Continues to have high oxygen requirements  Antimicorbials  :    Anti-infectives (From admission, onward)   Start     Dose/Rate Route Frequency Ordered Stop   02/16/20 1330  azithromycin (ZITHROMAX) 500 mg in sodium chloride 0.9 % 250 mL IVPB        500 mg 250 mL/hr over 60 Minutes Intravenous Every  24 hours 02/16/20 1237 02/20/20 1320   02/16/20 1300  cefTRIAXone (ROCEPHIN) 2 g in sodium chloride 0.9 % 100 mL IVPB        2 g 200 mL/hr over 30 Minutes Intravenous Every 24 hours 02/16/20 1237 02/20/20 1252   02/16/20 1000  remdesivir 100 mg in sodium chloride 0.9 % 100 mL IVPB       "Followed by" Linked Group Details   100 mg 200 mL/hr over 30 Minutes Intravenous Daily  02/15/20 1301 02/19/20 0855   02/15/20 1400  remdesivir 200 mg in sodium chloride 0.9% 250 mL IVPB       "Followed by" Linked Group Details   200 mg 580 mL/hr over 30 Minutes Intravenous Once 02/15/20 1301 02/15/20 1504      Inpatient Medications  Scheduled Meds: . acyclovir ointment   Topical Q3H  . allopurinol  300 mg Oral Daily  . vitamin C  500 mg Oral Daily  . aspirin EC  81 mg Oral Daily  . chlorhexidine  15 mL Mouth Rinse BID  . enoxaparin (LOVENOX) injection  40 mg Subcutaneous BID  . furosemide  20 mg Oral Daily  . insulin aspart  0-15 Units Subcutaneous TID WC  . insulin detemir  5 Units Subcutaneous Daily  . mouth rinse  15 mL Mouth Rinse q12n4p  . methylPREDNISolone (SOLU-MEDROL) injection  40 mg Intravenous Q12H  . mometasone-formoterol  2 puff Inhalation BID  . pantoprazole  40 mg Oral Daily  . polyethylene glycol  17 g Oral BID  . prednisoLONE acetate  1 drop Both Eyes QHS  . rosuvastatin  10 mg Oral Daily  . senna-docusate  2 tablet Oral QHS  . tamsulosin  0.4 mg Oral QHS  . zinc sulfate  220 mg Oral Daily   Continuous Infusions: . sodium chloride 10 mL/hr at 02/17/20 0500   PRN Meds:.acetaminophen, bisacodyl, chlorpheniramine-HYDROcodone, guaiFENesin-dextromethorphan, [DISCONTINUED] ondansetron **OR** ondansetron (ZOFRAN) IV, sodium phosphate   See all Orders from today for further details   Susa Raring M.D on 03/04/2020 at 2:00 PM  To page go to [email or web address] - use universal password  [facility] -  Office  [phone]    Objective:   Vitals:   03/04/20 0005 03/04/20 0400 03/04/20 0850 03/04/20 1100  BP: 96/68 (!) 101/59 (!) 100/53 (!) 105/49  Pulse: 84 86 (!) 104 (!) 101  Resp: 18 20 20 20   Temp: 97.9 F (36.6 C) 97.9 F (36.6 C) 97.6 F (36.4 C) (!) 97.5 F (36.4 C)  TempSrc: Oral Oral Oral Oral  SpO2: 100% 95% 95% 95%  Weight:      Height:        Wt Readings from Last 3 Encounters:  03/01/20 75.3 kg  02/09/20 83.9 kg    08/30/19 83 kg     Intake/Output Summary (Last 24 hours) at 03/04/2020 1400 Last data filed at 03/04/2020 0940 Gross per 24 hour  Intake 240 ml  Output 325 ml  Net -85 ml     Physical Exam  Awake Alert, No new F.N deficits, Normal affect [place].AT,PERRAL Supple Neck,No JVD, No cervical lymphadenopathy appriciated.  Symmetrical Chest wall movement, Good air movement bilaterally, CTAB RRR,No Gallops, Rubs or new Murmurs, No Parasternal Heave +ve B.Sounds, Abd Soft, No tenderness, No organomegaly appriciated, No rebound - guarding or rigidity. No Cyanosis, Clubbing or edema, No new Rash or bruise    Data Review:    CBC Recent Labs  Lab 02/27/20 0440 03/01/20 0726 03/04/20 0433  WBC 11.1*  10.4 8.6  HGB 15.0 13.2 11.5*  HCT 45.5 39.5 33.6*  PLT 272 174 128*  MCV 93.8 93.2 92.6  MCH 30.9 31.1 31.7  MCHC 33.0 33.4 34.2  RDW 12.9 13.1 12.8    Chemistries  Recent Labs  Lab 02/29/20 0222 03/01/20 0726 03/02/20 0337 03/03/20 0407 03/04/20 0433  NA 136 139 135 136 137  K 4.7 4.1 4.1 4.6 4.2  CL 99 99 101 100 102  CO2 30 31 29 28 28   GLUCOSE 99 88 114* 176* 117*  BUN 30* 26* 22 23 19   CREATININE 0.89 0.93 0.81 0.95 0.77  CALCIUM 7.9* 7.9* 7.7* 7.9* 7.6*  AST  --   --   --  36  --   ALT  --   --   --  67*  --   ALKPHOS  --   --   --  74  --   BILITOT  --   --   --  0.8  --      No results for input(s): DDIMER in the last 72 hours.      Component Value Date/Time   BNP 199.1 (H) 02/15/2020 1040    Micro Results No results found for this or any previous visit (from the past 240 hour(s)).  Radiology Reports CT Head Wo Contrast  Result Date: 02/09/2020 CLINICAL DATA:  Trauma, fall on blood thinners EXAM: CT HEAD WITHOUT CONTRAST CT CERVICAL SPINE WITHOUT CONTRAST TECHNIQUE: Multidetector CT imaging of the head and cervical spine was performed following the standard protocol without intravenous contrast. Multiplanar CT image reconstructions of the cervical  spine were also generated. COMPARISON:  None. FINDINGS: CT HEAD FINDINGS Brain: No acute territorial infarction, hemorrhage, or intracranial mass. Moderate atrophy. Mild hypodensity in the white matter consistent with chronic small vessel ischemic change. The ventricles are nonenlarged. Vascular: No hyperdense vessels.  Carotid vascular calcification. Skull: No depressed skull fracture. Sinuses/Orbits: Mucosal thickening in the maxillary and ethmoid sinuses. Small fluid level in the left sphenoid sinus but no central skull base lucency is seen. Possible chronic left nasal bone fracture. Other: Mild right posterior scalp swelling. CT CERVICAL SPINE FINDINGS Alignment: No subluxation.  Facet alignment within normal limits. Skull base and vertebrae: No acute fracture. No primary bone lesion or focal pathologic process. Soft tissues and spinal canal: No prevertebral fluid or swelling. No visible canal hematoma. Disc levels: Multiple level degenerative change with moderate severe disease at C5-C6 and C6-C7. Facet degenerative changes at multiple levels with foraminal narrowing. Upper chest: Apical scarring Other: None IMPRESSION: 1. No CT evidence for acute intracranial abnormality. Atrophy and mild chronic small vessel ischemic changes of the white matter. 2. Degenerative changes of the cervical spine. No acute osseous abnormality. Electronically Signed   By: Jasmine Pang M.D.   On: 02/09/2020 01:13   CT CHEST W CONTRAST  Result Date: 03/01/2020 CLINICAL DATA:  COVID pneumonia. EXAM: CT CHEST WITH CONTRAST TECHNIQUE: Multidetector CT imaging of the chest was performed during intravenous contrast administration. CONTRAST:  65mL OMNIPAQUE IOHEXOL 300 MG/ML  SOLN COMPARISON:  Chest CT 02/16/2020 FINDINGS: Cardiovascular: The heart is normal in size. No pericardial effusion. The aorta is normal in caliber. No dissection or focal aneurysm. Stable moderate atherosclerotic calcifications. The branch vessels are patent.  Stable three-vessel coronary artery calcifications. The pulmonary arteries are fairly well opacified. No filling defects to suggest pulmonary embolism. Mediastinum/Nodes: Fairly extensive mediastinal air. This is dissecting up into the neck and all along the chest wall. Mild pneumopericardium  also noted. Gas is dissecting into the intercostal muscles. There is a small left basilar pneumothorax and probable small anterior pneumothoraces but no large pneumothorax requiring chest tube placement. The esophagus is grossly normal. Lungs/Pleura: Vague diffuse patchy ground-glass infiltrates consistent with known COVID pneumonia. Overall definite improvement since the prior CT scan. Upper Abdomen: No significant upper abdominal findings. Numerous gallstones are again noted in the gallbladder. Musculoskeletal: No acute bony findings. Remote healed right rib fractures are noted. IMPRESSION: 1. Fairly extensive mediastinal air dissecting up into the neck and all along the chest wall. 2. Small left basilar pneumothorax and probable small anterior pneumothoraces but no large pneumothorax. 3. Persistent diffuse patchy ground-glass infiltrates consistent with known COVID pneumonia. Overall definite improvement since the prior CT scan. 4. Stable three-vessel coronary artery calcifications. 5. Cholelithiasis. 6. Aortic atherosclerosis. Aortic Atherosclerosis (ICD10-I70.0). Electronically Signed   By: Rudie Meyer M.D.   On: 03/01/2020 14:30   CT ANGIO CHEST PE W OR WO CONTRAST  Result Date: 02/16/2020 CLINICAL DATA:  COVID positive peer EXAM: CT ANGIOGRAPHY CHEST WITH CONTRAST TECHNIQUE: Multidetector CT imaging of the chest was performed using the standard protocol during bolus administration of intravenous contrast. Multiplanar CT image reconstructions and MIPs were obtained to evaluate the vascular anatomy. CONTRAST:  OMNIPAQUE IOHEXOL 350 MG/ML SOLN COMPARISON:  Oct 22, 2018 FINDINGS: Cardiovascular: There is moderate  severity calcification of the thoracic aorta. Satisfactory opacification of the pulmonary arteries to the segmental level. No evidence of pulmonary embolism. Normal heart size. No pericardial effusion. Mediastinum/Nodes: No enlarged mediastinal, hilar, or axillary lymph nodes. Thyroid gland, trachea, and esophagus demonstrate no significant findings. Lungs/Pleura: Marked severity patchy and ground-glass appearing infiltrates are seen throughout both lungs. There is no evidence of a pleural effusion or pneumothorax. Upper Abdomen: Numerous subcentimeter gallstones are seen within the lumen of an otherwise normal-appearing gallbladder. Musculoskeletal: No chest wall abnormality. No acute or significant osseous findings. Review of the MIP images confirms the above findings. IMPRESSION: 1. Marked severity bilateral patchy and ground-glass appearing infiltrates. 2. Cholelithiasis. 3. Aortic atherosclerosis. Aortic Atherosclerosis (ICD10-I70.0). Electronically Signed   By: Aram Candela M.D.   On: 02/16/2020 22:13   CT Cervical Spine Wo Contrast  Result Date: 02/09/2020 CLINICAL DATA:  Trauma, fall on blood thinners EXAM: CT HEAD WITHOUT CONTRAST CT CERVICAL SPINE WITHOUT CONTRAST TECHNIQUE: Multidetector CT imaging of the head and cervical spine was performed following the standard protocol without intravenous contrast. Multiplanar CT image reconstructions of the cervical spine were also generated. COMPARISON:  None. FINDINGS: CT HEAD FINDINGS Brain: No acute territorial infarction, hemorrhage, or intracranial mass. Moderate atrophy. Mild hypodensity in the white matter consistent with chronic small vessel ischemic change. The ventricles are nonenlarged. Vascular: No hyperdense vessels.  Carotid vascular calcification. Skull: No depressed skull fracture. Sinuses/Orbits: Mucosal thickening in the maxillary and ethmoid sinuses. Small fluid level in the left sphenoid sinus but no central skull base lucency is seen.  Possible chronic left nasal bone fracture. Other: Mild right posterior scalp swelling. CT CERVICAL SPINE FINDINGS Alignment: No subluxation.  Facet alignment within normal limits. Skull base and vertebrae: No acute fracture. No primary bone lesion or focal pathologic process. Soft tissues and spinal canal: No prevertebral fluid or swelling. No visible canal hematoma. Disc levels: Multiple level degenerative change with moderate severe disease at C5-C6 and C6-C7. Facet degenerative changes at multiple levels with foraminal narrowing. Upper chest: Apical scarring Other: None IMPRESSION: 1. No CT evidence for acute intracranial abnormality. Atrophy and mild chronic small  vessel ischemic changes of the white matter. 2. Degenerative changes of the cervical spine. No acute osseous abnormality. Electronically Signed   By: Jasmine Pang M.D.   On: 02/09/2020 01:13   DG Chest Port 1 View  Result Date: 03/03/2020 CLINICAL DATA:  COVID-19 pneumonia EXAM: PORTABLE CHEST 1 VIEW COMPARISON:  03/01/2020 FINDINGS: Was 624 hours. The cardiopericardial silhouette is within normal limits for size. Diffuse bilateral airspace disease is similar. Extensive pneumomediastinum and subcutaneous emphysema in the upper chest again noted. No substantial pleural effusion. The visualized bony structures of the thorax show no acute abnormality. Telemetry leads overlie the chest. IMPRESSION: No substantial change in exam. Diffuse bilateral airspace disease with extensive pneumomediastinum and subcutaneous emphysema in the upper chest. No pneumothorax. Electronically Signed   By: Kennith Center M.D.   On: 03/03/2020 07:42   DG Chest Port 1 View  Result Date: 03/01/2020 CLINICAL DATA:  Pneumonia due to COVID-19 EXAM: PORTABLE CHEST 1 VIEW COMPARISON:  Two days ago FINDINGS: Greater chest wall emphysema. Pneumomediastinum. There is biapical extrapleural gas versus trace pneumothorax, unchanged. Bilateral pulmonary infiltrate. Normal heart size.  IMPRESSION: 1. Progressive chest wall emphysema. 2. Small biapical pneumothorax versus extrapleural gas, interval stability favoring the latter. 3. Stable infiltrates. Electronically Signed   By: Marnee Spring M.D.   On: 03/01/2020 07:13   DG CHEST PORT 1 VIEW  Result Date: 02/28/2020 CLINICAL DATA:  Respiratory failure, COVID-19 positive, chest tightness EXAM: PORTABLE CHEST 1 VIEW COMPARISON:  02/24/2020 FINDINGS: Stable cardiomediastinal contours. Atherosclerotic calcification of the aortic knob. Subtle airspace opacities bilaterally with a predominantly peripheral and basilar distribution, not appreciably changed from prior. No pleural effusion. No evidence of pneumothorax. Interval development of subcutaneous emphysema most pronounced at the base of the right neck and within the right axillary region. IMPRESSION: 1. Interval development of subcutaneous emphysema at the base of the right neck and within the right axillary region. No evidence of pneumothorax. Correlate with physical exam and for history of attempted central line placement. 2. Stable bilateral peripheral and basilar airspace opacities. Electronically Signed   By: Duanne Guess D.O.   On: 02/28/2020 13:07   DG Chest Port 1 View  Result Date: 02/15/2020 CLINICAL DATA:  COVID positive.  Hypoxia. EXAM: PORTABLE CHEST 1 VIEW COMPARISON:  Oct 22, 2018 FINDINGS: Diffuse bilateral interstitial and airspace opacities, new from prior. No visible pleural effusion. No pneumothorax. Cardiac silhouette is accentuated by low lung volumes and portable technique. Aortic atherosclerosis. IMPRESSION: Diffuse bilateral interstitial and airspace opacities, likely multifocal pneumonia given the clinical history. Electronically Signed   By: Feliberto Harts MD   On: 02/15/2020 10:56   DG Chest Port 1V same Day  Result Date: 02/24/2020 CLINICAL DATA:  COVID EXAM: PORTABLE CHEST 1 VIEW COMPARISON:  02/15/2020 FINDINGS: Bilateral patchy airspace opacities  are again noted, similar prior study. Slight increase in lung volumes. Heart is normal size. No effusions or pneumothorax. No acute bony abnormality. IMPRESSION: Patchy bilateral airspace disease, not significantly changed since prior study. Slight increased lung volumes. Electronically Signed   By: Charlett Nose M.D.   On: 02/24/2020 08:31   VAS [place] LOWER EXTREMITY VENOUS (DVT)  Result Date: 02/16/2020  Lower Venous DVTStudy Indications: Swelling, and elevated d dimer.  Risk Factors: + covid. Performing Technologist: Jannet Askew RCT RDMS  Examination Guidelines: A complete evaluation includes B-mode imaging, spectral Doppler, color Doppler, and power Doppler as needed of all accessible portions of each vessel. Bilateral testing is considered an integral part of a  complete examination. Limited examinations for reoccurring indications may be performed as noted. The reflux portion of the exam is performed with the patient in reverse Trendelenburg.  +---------+---------------+---------+-----------+----------+--------------+ RIGHT    CompressibilityPhasicitySpontaneityPropertiesThrombus Aging +---------+---------------+---------+-----------+----------+--------------+ CFV      Full           Yes      Yes                                 +---------+---------------+---------+-----------+----------+--------------+ SFJ      Full                                                        +---------+---------------+---------+-----------+----------+--------------+ FV Prox  Full                                                        +---------+---------------+---------+-----------+----------+--------------+ FV Mid   Full                                                        +---------+---------------+---------+-----------+----------+--------------+ FV DistalFull                                                         +---------+---------------+---------+-----------+----------+--------------+ PFV      Full                                                        +---------+---------------+---------+-----------+----------+--------------+ POP      Full           Yes      Yes                                 +---------+---------------+---------+-----------+----------+--------------+ PTV      Full                                                        +---------+---------------+---------+-----------+----------+--------------+ PERO     Full                                                        +---------+---------------+---------+-----------+----------+--------------+   +---------+---------------+---------+-----------+----------+--------------+ LEFT     CompressibilityPhasicitySpontaneityPropertiesThrombus Aging +---------+---------------+---------+-----------+----------+--------------+ CFV      Full  Yes      Yes                                 +---------+---------------+---------+-----------+----------+--------------+ SFJ      Full                                                        +---------+---------------+---------+-----------+----------+--------------+ FV Prox  Full                                                        +---------+---------------+---------+-----------+----------+--------------+ FV Mid   Full                                                        +---------+---------------+---------+-----------+----------+--------------+ FV DistalFull                                                        +---------+---------------+---------+-----------+----------+--------------+ PFV      Full                                                        +---------+---------------+---------+-----------+----------+--------------+ POP      Full           Yes      Yes                                  +---------+---------------+---------+-----------+----------+--------------+ PTV      Full                                                        +---------+---------------+---------+-----------+----------+--------------+ PERO     Full                                                        +---------+---------------+---------+-----------+----------+--------------+     Summary: RIGHT: - There is no evidence of deep vein thrombosis in the lower extremity.  - No cystic structure found in the popliteal fossa.  LEFT: - There is no evidence of deep vein thrombosis in the lower extremity.  - No cystic structure found in the popliteal fossa.  *See table(s) above for measurements and observations. Electronically signed by Coral Else MD on 02/16/2020 at 9:33:24  PM.    Final    ECHOCARDIOGRAM LIMITED  Result Date: 02/16/2020    ECHOCARDIOGRAM LIMITED REPORT   Patient Name:   RYSHAWN SANZONE Date of Exam: 02/16/2020 Medical Rec #:  [id number]       Height:       72.0 in Accession #:    [id number]      Weight:       183.0 lb Date of Birth:  02/24/1935       BSA:          2.052 m Patient Age:    84 years        BP:           114/53 mmHg Patient Gender: M               HR:           77 bpm. Exam Location:  Inpatient Procedure: Limited Echo, Cardiac Doppler and Color Doppler Indications:    Elevated Troponin  History:        Patient has prior history of Echocardiogram examinations, most                 recent 02/17/2019. CHF and Cardiomyopathy, CAD, Arrythmias:Atrial                 Fibrillation, Signs/Symptoms:Shortness of Breath and Fever; Risk                 Factors:Hypertension, Dyslipidemia, OBESITY and Diabetes.                 COVID+.  Sonographer:    Lavenia Atlas Referring Phys: [id number] Kasandra Knudsen PAHWANI IMPRESSIONS  1. Left ventricular ejection fraction, by estimation, is 55 to 60%.  2. Right ventricular systolic function is normal. The right ventricular size is normal. There is normal pulmonary artery  systolic pressure.  3. The interatrial septum is aneuyrsmal with frequent bowing from right to left.  4. The mitral valve is normal in structure. Trivial mitral valve regurgitation.  5. There is mild calcification of the aortic valve. There is mild thickening of the aortic valve. Mild aortic valve sclerosis is present, with no evidence of aortic valve stenosis. Comparison(s): Compared to prior echo on 01/2019, the LVEF appears mildly improved to 55-60%. FINDINGS  Left Ventricle: Septal motion is abnormal consistent with prior surgery. Left ventricular ejection fraction, by estimation, is 55 to 60%. The left ventricular internal cavity size was normal in size. There is no left ventricular hypertrophy. Right Ventricle: The right ventricular size is normal. Right ventricular systolic function is normal. There is normal pulmonary artery systolic pressure. The tricuspid regurgitant velocity is 2.41 m/s, and with an assumed right atrial pressure of 3 mmHg,  the estimated right ventricular systolic pressure is 26.2 mmHg. Left Atrium: Left atrial size was normal in size. Pericardium: There is no evidence of pericardial effusion. Mitral Valve: The mitral valve is normal in structure. There is mild thickening of the mitral valve leaflet(s). Mild mitral annular calcification. Trivial mitral valve regurgitation. Tricuspid Valve: The tricuspid valve is normal in structure. Tricuspid valve regurgitation is mild. Aortic Valve: There is mild calcification of the aortic valve. There is mild thickening of the aortic valve. Mild aortic valve sclerosis is present, with no evidence of aortic valve stenosis. Pulmonic Valve: The pulmonic valve was not assessed. Aorta: The aortic root is normal in size and structure. LEFT VENTRICLE PLAX 2D LVIDd:         3.80 cm  Diastology LVIDs:         2.70 cm  LV e' medial:    7.51 cm/s LV PW:         1.20 cm  LV E/e' medial:  7.7 LV IVS:        0.90 cm  LV e' lateral:   5.98 cm/s LVOT diam:     1.90  cm  LV E/e' lateral: 9.7 LVOT Area:     2.84 cm  RIGHT VENTRICLE RV S prime:     13.30 cm/s LEFT ATRIUM         Index LA diam:    2.80 cm 1.36 cm/m   AORTA Ao Root diam: 2.50 cm MITRAL VALVE               TRICUSPID VALVE MV Area (PHT): 5.38 cm    TR Peak grad:   23.2 mmHg MV Decel Time: 141 msec    TR Vmax:        241.00 cm/s MV E velocity: 57.80 cm/s MV A velocity: 70.30 cm/s  SHUNTS MV E/A ratio:  0.82        Systemic Diam: 1.90 cm Laurance Flatten MD Electronically signed by Laurance Flatten MD Signature Date/Time: 02/16/2020/4:23:30 PM    Final    ABORTED INVASIVE LAB PROCEDURE  Result Date: 02/15/2020 This case was aborted.  US Abdomen Limited RUQ  Result Date: 02/15/2020 CLINICAL DATA:  Elevated liver enzymes. EXAM: ULTRASOUND ABDOMEN LIMITED RIGHT UPPER QUADRANT COMPARISON:  CT abdomen 07/06/2019. FINDINGS: Gallbladder: Gallstones measuring up to 7 mm noted. Gallbladder wall thickness normal. No Murphy sign. Common bile duct: Diameter: Not visualized due to overlying bowel gas. Liver: Left no not visualized due to overlying bowel gas. No focal lesion identified. Within normal limits in parenchymal echogenicity. Portal vein is patent on color Doppler imaging with normal direction of blood flow towards the liver. Other: Limited study due to overlying bowel gas. IMPRESSION: 1.  Multiple small gallstones.  No gallbladder wall thickening. 2. Limited exam due to overlying bowel gas. Common bile duct not visualized. Portions of the liver not visualized. Electronically Signed   By: Maisie Fus  Register   On: 02/15/2020 15:35

## 2020-03-05 DIAGNOSIS — J9601 Acute respiratory failure with hypoxia: Secondary | ICD-10-CM | POA: Diagnosis not present

## 2020-03-05 DIAGNOSIS — U071 COVID-19: Secondary | ICD-10-CM | POA: Diagnosis not present

## 2020-03-05 LAB — CBC WITH DIFFERENTIAL/PLATELET
Abs Immature Granulocytes: 0.06 10*3/uL (ref 0.00–0.07)
Basophils Absolute: 0 10*3/uL (ref 0.0–0.1)
Basophils Relative: 0 %
Eosinophils Absolute: 0 10*3/uL (ref 0.0–0.5)
Eosinophils Relative: 0 %
HCT: 33.3 % — ABNORMAL LOW (ref 39.0–52.0)
Hemoglobin: 11.2 g/dL — ABNORMAL LOW (ref 13.0–17.0)
Immature Granulocytes: 1 %
Lymphocytes Relative: 13 %
Lymphs Abs: 1.1 10*3/uL (ref 0.7–4.0)
MCH: 31.4 pg (ref 26.0–34.0)
MCHC: 33.6 g/dL (ref 30.0–36.0)
MCV: 93.3 fL (ref 80.0–100.0)
Monocytes Absolute: 0.4 10*3/uL (ref 0.1–1.0)
Monocytes Relative: 4 %
Neutro Abs: 6.8 10*3/uL (ref 1.7–7.7)
Neutrophils Relative %: 82 %
Platelets: 111 10*3/uL — ABNORMAL LOW (ref 150–400)
RBC: 3.57 MIL/uL — ABNORMAL LOW (ref 4.22–5.81)
RDW: 13 % (ref 11.5–15.5)
WBC: 8.3 10*3/uL (ref 4.0–10.5)
nRBC: 0 % (ref 0.0–0.2)

## 2020-03-05 LAB — BASIC METABOLIC PANEL
Anion gap: 7 (ref 5–15)
BUN: 15 mg/dL (ref 8–23)
CO2: 29 mmol/L (ref 22–32)
Calcium: 7.6 mg/dL — ABNORMAL LOW (ref 8.9–10.3)
Chloride: 101 mmol/L (ref 98–111)
Creatinine, Ser: 0.74 mg/dL (ref 0.61–1.24)
GFR, Estimated: >60 mL/min (ref >60)
Glucose, Bld: 164 mg/dL — ABNORMAL HIGH (ref 70–99)
Potassium: 4.5 mmol/L (ref 3.5–5.1)
Sodium: 137 mmol/L (ref 135–145)

## 2020-03-05 LAB — MAGNESIUM: Magnesium: 2.2 mg/dL (ref 1.7–2.4)

## 2020-03-05 LAB — GLUCOSE, CAPILLARY: Glucose-Capillary: 194 mg/dL — ABNORMAL HIGH (ref 70–99)

## 2020-03-05 LAB — BRAIN NATRIURETIC PEPTIDE: B Natriuretic Peptide: 45.8 pg/mL (ref 0.0–100.0)

## 2020-03-05 LAB — D-DIMER, QUANTITATIVE: D-Dimer, Quant: 0.83 ug/mL-FEU — ABNORMAL HIGH (ref 0.00–0.50)

## 2020-03-05 NOTE — Progress Notes (Signed)
PROGRESS NOTE                                                                                                                                                                                                             Patient Demographics:    Gordon Novak, is a 85 y.o. male, DOB - Sep 22, 1934, ZOX:[id number]  Outpatient Primary MD for the patient is [name], MD   Admit date - 02/15/2020   LOS - 19   Brief Narrative: Patient is a 85 y.o. male with PMHx of CAD s/p PCI 2020, DM-2, HLD, HTN, history of PE no longer on anticoagulation-presented with shortness of breath-found to have severe hypoxemia requiring high flow oxygen-due to COVID-19 pneumonia.  COVID-19 vaccinated status: Vaccinated  Significant Events: 9/21>> Admit to [facility] for hypoxia due to COVID-19 pneumonia  Significant studies: 9/21>>Chest x-ray: Diffuse lung infiltrates 9/22>> CTA chest: No PE-severe bilateral patchy/groundglass appearing infiltrates, cholelithiasis 9/22>> bilateral lower extremity Doppler: No DVT 9/22>> Echo: EF 55-60%, RV systolic function normal. 9/30>> chest x-ray: Patchy bilateral airspace disease-not significantly changed from prior.  COVID-19 medications: Steroids: 9/21>> Remdesivir: 9/21>> 9/25 Baricitinib: 9/21>>  Antibiotics: Rocephin: 9/22>> 9/26 Zithromax: 9/22>> 9/26  Microbiology data: 9/21 >>blood culture: No growth  Procedures: None  Consults: Cardiology  DVT prophylaxis: enoxaparin (LOVENOX) injection 40 mg at twice daily dosing (intermediate dosing) SCDs Start: 02/15/20 1300    Subjective:   Patient in bed, appears comfortable, denies any headache, no fever, no chest pain or pressure, improved shortness of breath , no abdominal pain. No focal weakness.   Assessment  & Plan :   Acute Hypoxic Resp Failure due to Covid 19 Viral pneumonia - Patient was fully vaccinated with the Pfizer vaccine.  He is  being treated with combination of IV steroids, Remdesivir and Baricitinib, had incurred severe parenchymal lung injury, clinical deterioration seem to have stabilized, still tenuous but slightly improved.  Continue to monitor clinical course, encouraged to sit up in chair in daytime use I-S and flutter valve in daytime.  SpO2: 90 % O2 Flow Rate (L/min): 2 L/min FiO2 (%): 50 %   Recent Labs  Lab 03/01/20 0726 03/03/20 0407 03/04/20 0433 03/04/20 1440 03/05/20 0656  WBC 10.4  --  8.6  --  8.3  DDIMER 0.85*  --   --   --  0.83*  BNP  --   --   --  44.5 45.8  AST  --  36  --   --   --   ALT  --  67*  --   --   --   ALKPHOS  --  74  --   --   --   BILITOT  --  0.8  --   --   --   ALBUMIN  --  2.1*  --   --   --        Pneumomediastinum -  Noted on CT scan.  Seems to be stable. Repeat Chest x-ray stable.  Elevated troponins - Minimally elevated troponins with flat trend. Not consistent with ACS. Echo with preserved EF.  Cardiology evaluation completed. No recommendations apart from supportive care.    Minimally elevated D-dimer - Has a history of PE in 2020 and completed 1 year treatment with Xarelto in May 2021.  CT angiogram chest and Doppler studies negative for VTE.  Continue intermediate dosing with Lovenox.  D-dimer has been improving.  Transaminitis Improved.  Likely secondary to COVID-19. Recheck in the outpatient setting.  Chronic Diastolic heart failure -  Echocardiogram done during this hospitalization shows normal systolic function.  Strict ins and outs and daily weights.   Furosemide being continued at a lower dose of 20 mg daily.  Dose was decreased due to low blood pressures.   Continue to monitor electrolytes.      History of CAD - Stable.  Seen by cardiology initially due to elevated troponin.  Currently without any chest pain.  Continue medical management.  On aspirin beta-blocker and statin.    Gout - Continue allopurinol  BPH - Continue Flomax  Chronic left  lower extremity swelling - At baseline. Lower extremity Dopplers were negative.  Compression stockings.  Constipation - Patient keeps thinking that he has not had a bowel movement but nurses report multiple bowel movements.  Continue MiraLAX.  Cholelithiasis - Incidentally noted on ultrasound of the abdomen.  Asymptomatic.  DM-2 (A1c 7.1 on 9/21) Noted to have hypoglycemia on 9/30 and dose of Levemir was decreased.  CBGs are noted to be stable.  Continue current dose of Levemir along with SSI. CBG (last 3)  Recent Labs    03/03/20 1152 03/03/20 1603 03/04/20 1626  GLUCAP 116* 228* 184*     DVT prophylaxis: Lovenox  Family Communication  :  Daughter [name]([name] [phone]).  Message left on 03/04/2020 at 2:03 PM, updated 03/05/2020 at 11:58 AM  Code Status : DNR  Diet :  Diet Order            Diet heart healthy/carb modified Room service appropriate? No; Fluid consistency: Thin  Diet effective now                  Disposition Plan  : Home health recommended by PT/OT  Status is: Inpatient  Remains inpatient appropriate because:Inpatient level of care appropriate due to severity of illness   Dispo: The patient is from: Home              Anticipated d/c is to: Home              Anticipated d/c date is: > 3 days              Patient currently is not medically stable to d/c.  Barriers to discharge: Continues to have high oxygen requirements  Antimicorbials  :    Anti-infectives (From admission,  onward)   Start     Dose/Rate Route Frequency Ordered Stop   02/16/20 1330  azithromycin (ZITHROMAX) 500 mg in sodium chloride 0.9 % 250 mL IVPB        500 mg 250 mL/hr over 60 Minutes Intravenous Every 24 hours 02/16/20 1237 02/20/20 1320   02/16/20 1300  cefTRIAXone (ROCEPHIN) 2 g in sodium chloride 0.9 % 100 mL IVPB        2 g 200 mL/hr over 30 Minutes Intravenous Every 24 hours 02/16/20 1237 02/20/20 1252   02/16/20 1000  remdesivir 100 mg in sodium chloride 0.9 % 100 mL  IVPB       "Followed by" Linked Group Details   100 mg 200 mL/hr over 30 Minutes Intravenous Daily 02/15/20 1301 02/19/20 0855   02/15/20 1400  remdesivir 200 mg in sodium chloride 0.9% 250 mL IVPB       "Followed by" Linked Group Details   200 mg 580 mL/hr over 30 Minutes Intravenous Once 02/15/20 1301 02/15/20 1504      Inpatient Medications  Scheduled Meds: . acyclovir ointment   Topical Q3H  . allopurinol  300 mg Oral Daily  . vitamin C  500 mg Oral Daily  . aspirin EC  81 mg Oral Daily  . chlorhexidine  15 mL Mouth Rinse BID  . enoxaparin (LOVENOX) injection  40 mg Subcutaneous BID  . furosemide  20 mg Oral Daily  . insulin aspart  0-15 Units Subcutaneous TID WC  . insulin detemir  5 Units Subcutaneous Daily  . mouth rinse  15 mL Mouth Rinse q12n4p  . methylPREDNISolone (SOLU-MEDROL) injection  40 mg Intravenous Q12H  . mometasone-formoterol  2 puff Inhalation BID  . pantoprazole  40 mg Oral Daily  . polyethylene glycol  17 g Oral BID  . prednisoLONE acetate  1 drop Both Eyes QHS  . rosuvastatin  10 mg Oral Daily  . senna-docusate  2 tablet Oral QHS  . tamsulosin  0.4 mg Oral QHS  . zinc sulfate  220 mg Oral Daily   Continuous Infusions: . sodium chloride 10 mL/hr at 02/17/20 0500   PRN Meds:.acetaminophen, bisacodyl, chlorpheniramine-HYDROcodone, guaiFENesin-dextromethorphan, [DISCONTINUED] ondansetron **OR** ondansetron (ZOFRAN) IV, sodium phosphate   See all Orders from today for further details   Susa Raring M.D on 03/05/2020 at 11:57 AM  To page go to [email or web address] - use universal password  [facility] -  Office  [phone]    Objective:   Vitals:   03/05/20 0506 03/05/20 0508 03/05/20 0625 03/05/20 0833  BP: (!) 103/42   (!) 97/50  Pulse: 90  86 100  Resp: (!) 21 (!) 22  18  Temp: 98.3 F (36.8 C)   97.7 F (36.5 C)  TempSrc: Oral Oral  Axillary  SpO2: 94%  93% 90%  Weight:      Height:        Wt Readings from Last 3 Encounters:   03/01/20 75.3 kg  02/09/20 83.9 kg  08/30/19 83 kg     Intake/Output Summary (Last 24 hours) at 03/05/2020 1157 Last data filed at 03/05/2020 0900 Gross per 24 hour  Intake 720 ml  Output --  Net 720 ml     Physical Exam  Awake Alert, No new F.N deficits, Normal affect [place].AT,PERRAL Supple Neck,No JVD, No cervical lymphadenopathy appriciated.  Symmetrical Chest wall movement, Good air movement bilaterally, CTAB RRR,No Gallops, Rubs or new Murmurs, No Parasternal Heave +ve B.Sounds, Abd Soft, No tenderness, No organomegaly appriciated, No rebound -  guarding or rigidity. No Cyanosis, Clubbing or edema, No new Rash or bruise     Data Review:    CBC Recent Labs  Lab 03/01/20 0726 03/04/20 0433 03/05/20 0656  WBC 10.4 8.6 8.3  HGB 13.2 11.5* 11.2*  HCT 39.5 33.6* 33.3*  PLT 174 128* 111*  MCV 93.2 92.6 93.3  MCH 31.1 31.7 31.4  MCHC 33.4 34.2 33.6  RDW 13.1 12.8 13.0  LYMPHSABS  --   --  1.1  MONOABS  --   --  0.4  EOSABS  --   --  0.0  BASOSABS  --   --  0.0    Chemistries  Recent Labs  Lab 03/01/20 0726 03/02/20 0337 03/03/20 0407 03/04/20 0433 03/05/20 0656  NA 139 135 136 137 137  K 4.1 4.1 4.6 4.2 4.5  CL 99 101 100 102 101  CO2 31 29 28 28 29   GLUCOSE 88 114* 176* 117* 164*  BUN 26* 22 23 19 15   CREATININE 0.93 0.81 0.95 0.77 0.74  CALCIUM 7.9* 7.7* 7.9* 7.6* 7.6*  MG  --   --   --   --  2.2  AST  --   --  36  --   --   ALT  --   --  67*  --   --   ALKPHOS  --   --  74  --   --   BILITOT  --   --  0.8  --   --      Recent Labs    03/05/20 0656  DDIMER 0.83*        Component Value Date/Time   BNP 45.8 03/05/2020 0656    Micro Results No results found for this or any previous visit (from the past 240 hour(s)).  Radiology Reports CT Head Wo Contrast  Result Date: 02/09/2020 CLINICAL DATA:  Trauma, fall on blood thinners EXAM: CT HEAD WITHOUT CONTRAST CT CERVICAL SPINE WITHOUT CONTRAST TECHNIQUE: Multidetector CT imaging of the  head and cervical spine was performed following the standard protocol without intravenous contrast. Multiplanar CT image reconstructions of the cervical spine were also generated. COMPARISON:  None. FINDINGS: CT HEAD FINDINGS Brain: No acute territorial infarction, hemorrhage, or intracranial mass. Moderate atrophy. Mild hypodensity in the white matter consistent with chronic small vessel ischemic change. The ventricles are nonenlarged. Vascular: No hyperdense vessels.  Carotid vascular calcification. Skull: No depressed skull fracture. Sinuses/Orbits: Mucosal thickening in the maxillary and ethmoid sinuses. Small fluid level in the left sphenoid sinus but no central skull base lucency is seen. Possible chronic left nasal bone fracture. Other: Mild right posterior scalp swelling. CT CERVICAL SPINE FINDINGS Alignment: No subluxation.  Facet alignment within normal limits. Skull base and vertebrae: No acute fracture. No primary bone lesion or focal pathologic process. Soft tissues and spinal canal: No prevertebral fluid or swelling. No visible canal hematoma. Disc levels: Multiple level degenerative change with moderate severe disease at C5-C6 and C6-C7. Facet degenerative changes at multiple levels with foraminal narrowing. Upper chest: Apical scarring Other: None IMPRESSION: 1. No CT evidence for acute intracranial abnormality. Atrophy and mild chronic small vessel ischemic changes of the white matter. 2. Degenerative changes of the cervical spine. No acute osseous abnormality. Electronically Signed   By: 05/05/2020 M.D.   On: 02/09/2020 01:13   CT CHEST W CONTRAST  Result Date: 03/01/2020 CLINICAL DATA:  COVID pneumonia. EXAM: CT CHEST WITH CONTRAST TECHNIQUE: Multidetector CT imaging of the chest was performed during  intravenous contrast administration. CONTRAST:  57mL OMNIPAQUE IOHEXOL 300 MG/ML  SOLN COMPARISON:  Chest CT 02/16/2020 FINDINGS: Cardiovascular: The heart is normal in size. No pericardial  effusion. The aorta is normal in caliber. No dissection or focal aneurysm. Stable moderate atherosclerotic calcifications. The branch vessels are patent. Stable three-vessel coronary artery calcifications. The pulmonary arteries are fairly well opacified. No filling defects to suggest pulmonary embolism. Mediastinum/Nodes: Fairly extensive mediastinal air. This is dissecting up into the neck and all along the chest wall. Mild pneumopericardium also noted. Gas is dissecting into the intercostal muscles. There is a small left basilar pneumothorax and probable small anterior pneumothoraces but no large pneumothorax requiring chest tube placement. The esophagus is grossly normal. Lungs/Pleura: Vague diffuse patchy ground-glass infiltrates consistent with known COVID pneumonia. Overall definite improvement since the prior CT scan. Upper Abdomen: No significant upper abdominal findings. Numerous gallstones are again noted in the gallbladder. Musculoskeletal: No acute bony findings. Remote healed right rib fractures are noted. IMPRESSION: 1. Fairly extensive mediastinal air dissecting up into the neck and all along the chest wall. 2. Small left basilar pneumothorax and probable small anterior pneumothoraces but no large pneumothorax. 3. Persistent diffuse patchy ground-glass infiltrates consistent with known COVID pneumonia. Overall definite improvement since the prior CT scan. 4. Stable three-vessel coronary artery calcifications. 5. Cholelithiasis. 6. Aortic atherosclerosis. Aortic Atherosclerosis (ICD10-I70.0). Electronically Signed   By: Rudie Meyer M.D.   On: 03/01/2020 14:30   CT ANGIO CHEST PE W OR WO CONTRAST  Result Date: 02/16/2020 CLINICAL DATA:  COVID positive peer EXAM: CT ANGIOGRAPHY CHEST WITH CONTRAST TECHNIQUE: Multidetector CT imaging of the chest was performed using the standard protocol during bolus administration of intravenous contrast. Multiplanar CT image reconstructions and MIPs were obtained  to evaluate the vascular anatomy. CONTRAST:  OMNIPAQUE IOHEXOL 350 MG/ML SOLN COMPARISON:  Oct 22, 2018 FINDINGS: Cardiovascular: There is moderate severity calcification of the thoracic aorta. Satisfactory opacification of the pulmonary arteries to the segmental level. No evidence of pulmonary embolism. Normal heart size. No pericardial effusion. Mediastinum/Nodes: No enlarged mediastinal, hilar, or axillary lymph nodes. Thyroid gland, trachea, and esophagus demonstrate no significant findings. Lungs/Pleura: Marked severity patchy and ground-glass appearing infiltrates are seen throughout both lungs. There is no evidence of a pleural effusion or pneumothorax. Upper Abdomen: Numerous subcentimeter gallstones are seen within the lumen of an otherwise normal-appearing gallbladder. Musculoskeletal: No chest wall abnormality. No acute or significant osseous findings. Review of the MIP images confirms the above findings. IMPRESSION: 1. Marked severity bilateral patchy and ground-glass appearing infiltrates. 2. Cholelithiasis. 3. Aortic atherosclerosis. Aortic Atherosclerosis (ICD10-I70.0). Electronically Signed   By: Aram Candela M.D.   On: 02/16/2020 22:13   CT Cervical Spine Wo Contrast  Result Date: 02/09/2020 CLINICAL DATA:  Trauma, fall on blood thinners EXAM: CT HEAD WITHOUT CONTRAST CT CERVICAL SPINE WITHOUT CONTRAST TECHNIQUE: Multidetector CT imaging of the head and cervical spine was performed following the standard protocol without intravenous contrast. Multiplanar CT image reconstructions of the cervical spine were also generated. COMPARISON:  None. FINDINGS: CT HEAD FINDINGS Brain: No acute territorial infarction, hemorrhage, or intracranial mass. Moderate atrophy. Mild hypodensity in the white matter consistent with chronic small vessel ischemic change. The ventricles are nonenlarged. Vascular: No hyperdense vessels.  Carotid vascular calcification. Skull: No depressed skull fracture.  Sinuses/Orbits: Mucosal thickening in the maxillary and ethmoid sinuses. Small fluid level in the left sphenoid sinus but no central skull base lucency is seen. Possible chronic left nasal bone fracture. Other: Mild right  posterior scalp swelling. CT CERVICAL SPINE FINDINGS Alignment: No subluxation.  Facet alignment within normal limits. Skull base and vertebrae: No acute fracture. No primary bone lesion or focal pathologic process. Soft tissues and spinal canal: No prevertebral fluid or swelling. No visible canal hematoma. Disc levels: Multiple level degenerative change with moderate severe disease at C5-C6 and C6-C7. Facet degenerative changes at multiple levels with foraminal narrowing. Upper chest: Apical scarring Other: None IMPRESSION: 1. No CT evidence for acute intracranial abnormality. Atrophy and mild chronic small vessel ischemic changes of the white matter. 2. Degenerative changes of the cervical spine. No acute osseous abnormality. Electronically Signed   By: Jasmine Pang M.D.   On: 02/09/2020 01:13   DG Chest Port 1 View  Result Date: 03/03/2020 CLINICAL DATA:  COVID-19 pneumonia EXAM: PORTABLE CHEST 1 VIEW COMPARISON:  03/01/2020 FINDINGS: Was 624 hours. The cardiopericardial silhouette is within normal limits for size. Diffuse bilateral airspace disease is similar. Extensive pneumomediastinum and subcutaneous emphysema in the upper chest again noted. No substantial pleural effusion. The visualized bony structures of the thorax show no acute abnormality. Telemetry leads overlie the chest. IMPRESSION: No substantial change in exam. Diffuse bilateral airspace disease with extensive pneumomediastinum and subcutaneous emphysema in the upper chest. No pneumothorax. Electronically Signed   By: Kennith Center M.D.   On: 03/03/2020 07:42   DG Chest Port 1 View  Result Date: 03/01/2020 CLINICAL DATA:  Pneumonia due to COVID-19 EXAM: PORTABLE CHEST 1 VIEW COMPARISON:  Two days ago FINDINGS: Greater  chest wall emphysema. Pneumomediastinum. There is biapical extrapleural gas versus trace pneumothorax, unchanged. Bilateral pulmonary infiltrate. Normal heart size. IMPRESSION: 1. Progressive chest wall emphysema. 2. Small biapical pneumothorax versus extrapleural gas, interval stability favoring the latter. 3. Stable infiltrates. Electronically Signed   By: Marnee Spring M.D.   On: 03/01/2020 07:13   DG CHEST PORT 1 VIEW  Result Date: 02/28/2020 CLINICAL DATA:  Respiratory failure, COVID-19 positive, chest tightness EXAM: PORTABLE CHEST 1 VIEW COMPARISON:  02/24/2020 FINDINGS: Stable cardiomediastinal contours. Atherosclerotic calcification of the aortic knob. Subtle airspace opacities bilaterally with a predominantly peripheral and basilar distribution, not appreciably changed from prior. No pleural effusion. No evidence of pneumothorax. Interval development of subcutaneous emphysema most pronounced at the base of the right neck and within the right axillary region. IMPRESSION: 1. Interval development of subcutaneous emphysema at the base of the right neck and within the right axillary region. No evidence of pneumothorax. Correlate with physical exam and for history of attempted central line placement. 2. Stable bilateral peripheral and basilar airspace opacities. Electronically Signed   By: Duanne Guess D.O.   On: 02/28/2020 13:07   DG Chest Port 1 View  Result Date: 02/15/2020 CLINICAL DATA:  COVID positive.  Hypoxia. EXAM: PORTABLE CHEST 1 VIEW COMPARISON:  Oct 22, 2018 FINDINGS: Diffuse bilateral interstitial and airspace opacities, new from prior. No visible pleural effusion. No pneumothorax. Cardiac silhouette is accentuated by low lung volumes and portable technique. Aortic atherosclerosis. IMPRESSION: Diffuse bilateral interstitial and airspace opacities, likely multifocal pneumonia given the clinical history. Electronically Signed   By: Feliberto Harts MD   On: 02/15/2020 10:56   DG  Chest Port 1V same Day  Result Date: 02/24/2020 CLINICAL DATA:  COVID EXAM: PORTABLE CHEST 1 VIEW COMPARISON:  02/15/2020 FINDINGS: Bilateral patchy airspace opacities are again noted, similar prior study. Slight increase in lung volumes. Heart is normal size. No effusions or pneumothorax. No acute bony abnormality. IMPRESSION: Patchy bilateral airspace disease, not significantly changed  since prior study. Slight increased lung volumes. Electronically Signed   By: Charlett Nose M.D.   On: 02/24/2020 08:31   VAS [place] LOWER EXTREMITY VENOUS (DVT)  Result Date: 02/16/2020  Lower Venous DVTStudy Indications: Swelling, and elevated d dimer.  Risk Factors: + covid. Performing Technologist: Jannet Askew RCT RDMS  Examination Guidelines: A complete evaluation includes B-mode imaging, spectral Doppler, color Doppler, and power Doppler as needed of all accessible portions of each vessel. Bilateral testing is considered an integral part of a complete examination. Limited examinations for reoccurring indications may be performed as noted. The reflux portion of the exam is performed with the patient in reverse Trendelenburg.  +---------+---------------+---------+-----------+----------+--------------+ RIGHT    CompressibilityPhasicitySpontaneityPropertiesThrombus Aging +---------+---------------+---------+-----------+----------+--------------+ CFV      Full           Yes      Yes                                 +---------+---------------+---------+-----------+----------+--------------+ SFJ      Full                                                        +---------+---------------+---------+-----------+----------+--------------+ FV Prox  Full                                                        +---------+---------------+---------+-----------+----------+--------------+ FV Mid   Full                                                         +---------+---------------+---------+-----------+----------+--------------+ FV DistalFull                                                        +---------+---------------+---------+-----------+----------+--------------+ PFV      Full                                                        +---------+---------------+---------+-----------+----------+--------------+ POP      Full           Yes      Yes                                 +---------+---------------+---------+-----------+----------+--------------+ PTV      Full                                                        +---------+---------------+---------+-----------+----------+--------------+  PERO     Full                                                        +---------+---------------+---------+-----------+----------+--------------+   +---------+---------------+---------+-----------+----------+--------------+ LEFT     CompressibilityPhasicitySpontaneityPropertiesThrombus Aging +---------+---------------+---------+-----------+----------+--------------+ CFV      Full           Yes      Yes                                 +---------+---------------+---------+-----------+----------+--------------+ SFJ      Full                                                        +---------+---------------+---------+-----------+----------+--------------+ FV Prox  Full                                                        +---------+---------------+---------+-----------+----------+--------------+ FV Mid   Full                                                        +---------+---------------+---------+-----------+----------+--------------+ FV DistalFull                                                        +---------+---------------+---------+-----------+----------+--------------+ PFV      Full                                                         +---------+---------------+---------+-----------+----------+--------------+ POP      Full           Yes      Yes                                 +---------+---------------+---------+-----------+----------+--------------+ PTV      Full                                                        +---------+---------------+---------+-----------+----------+--------------+ PERO     Full                                                        +---------+---------------+---------+-----------+----------+--------------+  Summary: RIGHT: - There is no evidence of deep vein thrombosis in the lower extremity.  - No cystic structure found in the popliteal fossa.  LEFT: - There is no evidence of deep vein thrombosis in the lower extremity.  - No cystic structure found in the popliteal fossa.  *See table(s) above for measurements and observations. Electronically signed by Coral Else MD on 02/16/2020 at 9:33:24 PM.    Final    ECHOCARDIOGRAM LIMITED  Result Date: 02/16/2020    ECHOCARDIOGRAM LIMITED REPORT   Patient Name:   CORDIE BUENING Date of Exam: 02/16/2020 Medical Rec #:  [id number]       Height:       72.0 in Accession #:    [id number]      Weight:       183.0 lb Date of Birth:  1935/02/10       BSA:          2.052 m Patient Age:    84 years        BP:           114/53 mmHg Patient Gender: M               HR:           77 bpm. Exam Location:  Inpatient Procedure: Limited Echo, Cardiac Doppler and Color Doppler Indications:    Elevated Troponin  History:        Patient has prior history of Echocardiogram examinations, most                 recent 02/17/2019. CHF and Cardiomyopathy, CAD, Arrythmias:Atrial                 Fibrillation, Signs/Symptoms:Shortness of Breath and Fever; Risk                 Factors:Hypertension, Dyslipidemia, OBESITY and Diabetes.                 COVID+.  Sonographer:    Lavenia Atlas Referring Phys: [id number] Kasandra Knudsen PAHWANI IMPRESSIONS  1. Left ventricular ejection fraction,  by estimation, is 55 to 60%.  2. Right ventricular systolic function is normal. The right ventricular size is normal. There is normal pulmonary artery systolic pressure.  3. The interatrial septum is aneuyrsmal with frequent bowing from right to left.  4. The mitral valve is normal in structure. Trivial mitral valve regurgitation.  5. There is mild calcification of the aortic valve. There is mild thickening of the aortic valve. Mild aortic valve sclerosis is present, with no evidence of aortic valve stenosis. Comparison(s): Compared to prior echo on 01/2019, the LVEF appears mildly improved to 55-60%. FINDINGS  Left Ventricle: Septal motion is abnormal consistent with prior surgery. Left ventricular ejection fraction, by estimation, is 55 to 60%. The left ventricular internal cavity size was normal in size. There is no left ventricular hypertrophy. Right Ventricle: The right ventricular size is normal. Right ventricular systolic function is normal. There is normal pulmonary artery systolic pressure. The tricuspid regurgitant velocity is 2.41 m/s, and with an assumed right atrial pressure of 3 mmHg,  the estimated right ventricular systolic pressure is 26.2 mmHg. Left Atrium: Left atrial size was normal in size. Pericardium: There is no evidence of pericardial effusion. Mitral Valve: The mitral valve is normal in structure. There is mild thickening of the mitral valve leaflet(s). Mild mitral annular calcification. Trivial mitral valve regurgitation. Tricuspid Valve: The tricuspid valve is normal in structure. Tricuspid valve  regurgitation is mild. Aortic Valve: There is mild calcification of the aortic valve. There is mild thickening of the aortic valve. Mild aortic valve sclerosis is present, with no evidence of aortic valve stenosis. Pulmonic Valve: The pulmonic valve was not assessed. Aorta: The aortic root is normal in size and structure. LEFT VENTRICLE PLAX 2D LVIDd:         3.80 cm  Diastology LVIDs:          2.70 cm  LV e' medial:    7.51 cm/s LV PW:         1.20 cm  LV E/e' medial:  7.7 LV IVS:        0.90 cm  LV e' lateral:   5.98 cm/s LVOT diam:     1.90 cm  LV E/e' lateral: 9.7 LVOT Area:     2.84 cm  RIGHT VENTRICLE RV S prime:     13.30 cm/s LEFT ATRIUM         Index LA diam:    2.80 cm 1.36 cm/m   AORTA Ao Root diam: 2.50 cm MITRAL VALVE               TRICUSPID VALVE MV Area (PHT): 5.38 cm    TR Peak grad:   23.2 mmHg MV Decel Time: 141 msec    TR Vmax:        241.00 cm/s MV E velocity: 57.80 cm/s MV A velocity: 70.30 cm/s  SHUNTS MV E/A ratio:  0.82        Systemic Diam: 1.90 cm Laurance Flatten MD Electronically signed by Laurance Flatten MD Signature Date/Time: 02/16/2020/4:23:30 PM    Final    ABORTED INVASIVE LAB PROCEDURE  Result Date: 02/15/2020 This case was aborted.  US Abdomen Limited RUQ  Result Date: 02/15/2020 CLINICAL DATA:  Elevated liver enzymes. EXAM: ULTRASOUND ABDOMEN LIMITED RIGHT UPPER QUADRANT COMPARISON:  CT abdomen 07/06/2019. FINDINGS: Gallbladder: Gallstones measuring up to 7 mm noted. Gallbladder wall thickness normal. No Murphy sign. Common bile duct: Diameter: Not visualized due to overlying bowel gas. Liver: Left no not visualized due to overlying bowel gas. No focal lesion identified. Within normal limits in parenchymal echogenicity. Portal vein is patent on color Doppler imaging with normal direction of blood flow towards the liver. Other: Limited study due to overlying bowel gas. IMPRESSION: 1.  Multiple small gallstones.  No gallbladder wall thickening. 2. Limited exam due to overlying bowel gas. Common bile duct not visualized. Portions of the liver not visualized. Electronically Signed   By: Maisie Fus  Register   On: 02/15/2020 15:35

## 2020-03-05 NOTE — Progress Notes (Signed)
Patient sat in chair almost 4 hours this shift. Patient assisted with bathing and assist x1 to bed. O2 eventually titrated to 92% [place]. Patient has been tolerating well. Patient assisted from bed to [facility] this morning. SpO2 noted as low as 80% on O2 2L. Within 7 minutes, SpO2 back up to 88% on O2 2L. No distress noted at this time. Patient denies needs. Will continue to monitor.

## 2020-03-06 ENCOUNTER — Inpatient Hospital Stay (HOSPITAL_COMMUNITY): Payer: Medicare Other

## 2020-03-06 DIAGNOSIS — J9601 Acute respiratory failure with hypoxia: Secondary | ICD-10-CM | POA: Diagnosis not present

## 2020-03-06 DIAGNOSIS — U071 COVID-19: Secondary | ICD-10-CM | POA: Diagnosis not present

## 2020-03-06 LAB — BASIC METABOLIC PANEL
Anion gap: 7 (ref 5–15)
BUN: 17 mg/dL (ref 8–23)
CO2: 27 mmol/L (ref 22–32)
Calcium: 7.6 mg/dL — ABNORMAL LOW (ref 8.9–10.3)
Chloride: 102 mmol/L (ref 98–111)
Creatinine, Ser: 0.8 mg/dL (ref 0.61–1.24)
GFR, Estimated: >60 mL/min (ref >60)
Glucose, Bld: 187 mg/dL — ABNORMAL HIGH (ref 70–99)
Potassium: 4.2 mmol/L (ref 3.5–5.1)
Sodium: 136 mmol/L (ref 135–145)

## 2020-03-06 LAB — GLUCOSE, CAPILLARY
Glucose-Capillary: 131 mg/dL — ABNORMAL HIGH (ref 70–99)
Glucose-Capillary: 140 mg/dL — ABNORMAL HIGH (ref 70–99)
Glucose-Capillary: 175 mg/dL — ABNORMAL HIGH (ref 70–99)

## 2020-03-06 LAB — CBC WITH DIFFERENTIAL/PLATELET
Abs Immature Granulocytes: 0.05 10*3/uL (ref 0.00–0.07)
Basophils Absolute: 0 10*3/uL (ref 0.0–0.1)
Basophils Relative: 0 %
Eosinophils Absolute: 0 10*3/uL (ref 0.0–0.5)
Eosinophils Relative: 0 %
HCT: 31.5 % — ABNORMAL LOW (ref 39.0–52.0)
Hemoglobin: 10.5 g/dL — ABNORMAL LOW (ref 13.0–17.0)
Immature Granulocytes: 1 %
Lymphocytes Relative: 12 %
Lymphs Abs: 0.9 10*3/uL (ref 0.7–4.0)
MCH: 30.8 pg (ref 26.0–34.0)
MCHC: 33.3 g/dL (ref 30.0–36.0)
MCV: 92.4 fL (ref 80.0–100.0)
Monocytes Absolute: 0.3 10*3/uL (ref 0.1–1.0)
Monocytes Relative: 4 %
Neutro Abs: 5.7 10*3/uL (ref 1.7–7.7)
Neutrophils Relative %: 83 %
Platelets: 102 10*3/uL — ABNORMAL LOW (ref 150–400)
RBC: 3.41 MIL/uL — ABNORMAL LOW (ref 4.22–5.81)
RDW: 13.2 % (ref 11.5–15.5)
WBC: 6.8 10*3/uL (ref 4.0–10.5)
nRBC: 0 % (ref 0.0–0.2)

## 2020-03-06 LAB — D-DIMER, QUANTITATIVE: D-Dimer, Quant: 0.69 ug/mL-FEU — ABNORMAL HIGH (ref 0.00–0.50)

## 2020-03-06 LAB — BRAIN NATRIURETIC PEPTIDE: B Natriuretic Peptide: 42 pg/mL (ref 0.0–100.0)

## 2020-03-06 LAB — MAGNESIUM: Magnesium: 2.1 mg/dL (ref 1.7–2.4)

## 2020-03-06 NOTE — TOC Progression Note (Addendum)
Transition of Care [facility]) - Progression Note    Patient Details  Name: Gordon Novak MRN: [id number] Date of Birth: 18-Feb-1935  Transition of Care [facility]) CM/SW Contact  Nance Pear, RN Phone Number: 03/06/2020, 9:39 AM  Clinical Narrative:    Case manager called patient on phone due to COVID.  High risk readmission assessment completed.  Case manager offered choice Medicare.gov for DME, patient did not have a preference and is allowing CM to choose.  Called [employer], Ian Malkin, for 3 in 1 commode and rolling walker.  Patient states lives with wife, daughter helps, good support system.  Old walker at home but not appropriate for patient, no other equipment at home.  Daughter to pick up when discharged.  Patient will probably need oxygen, requested from RN walking saturations, reached out to Physical Therapy about 4-5 stairs going into home.  TOC following for progression of care.   Expected Discharge Plan: Home w Home Health Services Barriers to Discharge: Continued Medical Work up  Expected Discharge Plan and Services Expected Discharge Plan: Home w Home Health Services   Discharge Planning Services: CM Consult Post Acute Care Choice: Home Health, Durable Medical Equipment Living arrangements for the past 2 months: Single Family Home                 DME Arranged: 3-N-1, Walker rolling DME Agency: [employer] Date DME Agency Contacted: 03/06/20 Time DME Agency Contacted: [phone] Representative spoke with at DME Agency: Beryl Meager Agency: [facility] Date [facility] Agency Contacted: 03/01/20 Time HH Agency Contacted: [phone] Representative spoke with at [facility] Agency: Confirmed patient choice for [employer]   Social Determinants of Health (SDOH) Interventions    Readmission Risk Interventions No flowsheet data found.

## 2020-03-06 NOTE — Progress Notes (Signed)
Physical Therapy Treatment Patient Details Name: Gordon Novak MRN: [id number] DOB: 1934-06-05 Today's Date: 03/06/2020    History of Present Illness Pt is an 85 y.o. male who tested (+) COVID-19 on 02/07/20, now admitted 02/15/20 with hypoxia; workup for acute hypoxemic respiratory failure due to COVID-19 PNA with severe lung parenchymal injury. PMH includes CAD, HTN, HLD, DM2, PE; pt fully vaccinated.    PT Comments    Pt fatigued this session due to lengthy time on [facility] for BM. He performed LE exercises in recliner, reclined with feet elevated and upright with feet on floor. Pt on 6L O2. SpO2 92% at rest. Desat to 82% during activity. 3-5 minute recovery time, with cues for pursed lip breathing, to return to 90%. Pt very motivated and eager to participate in therapy.   Follow Up Recommendations  Home health PT;Supervision for mobility/OOB     Equipment Recommendations  Rolling walker with 5" wheels;3in1 (PT)    Recommendations for Other Services       Precautions / Restrictions Precautions Precautions: Fall;Other (comment) Precaution Comments: Quick to desaturate     Mobility  Bed Mobility               General bed mobility comments: In recliner upon arrival  Transfers Overall transfer level: Needs assistance   Transfers: Stand Pivot Transfers   Stand pivot transfers: Min guard       General transfer comment: min guard assist pivot transfer from [facility]  Ambulation/Gait             General Gait Details: unable due to fatigue   Stairs             Wheelchair Mobility    Modified Rankin (Stroke Patients Only)       Balance Overall balance assessment: Needs assistance Sitting-balance support: No upper extremity supported;Feet supported Sitting balance-Leahy Scale: Good     Standing balance support: During functional activity;No upper extremity supported Standing balance-Leahy Scale: Fair                               Cognition Arousal/Alertness: Awake/alert Behavior During Therapy: WFL for tasks assessed/performed Overall Cognitive Status: Within Functional Limits for tasks assessed                                        Exercises General Exercises - Lower Extremity Ankle Circles/Pumps: AROM;Both;10 reps;Supine Long Arc Quad: AROM;Right;Left;10 reps;Seated Heel Slides: AROM;Right;Left;10 reps;Supine Hip ABduction/ADduction: AROM;Right;Left;10 reps;Supine    General Comments General comments (skin integrity, edema, etc.): SpO2 92% on 6L. Desat to 82% during transfer and exercises. Pt fatigued due to just transferring from [facility] where he had a BM.      Pertinent Vitals/Pain Pain Assessment: No/denies pain    Home Living                      Prior Function            PT Goals (current goals can now be found in the care plan section) Acute Rehab PT Goals Patient Stated Goal: home Progress towards PT goals: Not progressing toward goals - comment (increased fatigue due to lengthy time on [facility] for BM)    Frequency    Min 3X/week      PT Plan Current plan remains appropriate    Co-evaluation  AM-PAC PT "6 Clicks" Mobility   Outcome Measure  Help needed turning from your back to your side while in a flat bed without using bedrails?: None Help needed moving from lying on your back to sitting on the side of a flat bed without using bedrails?: None Help needed moving to and from a bed to a chair (including a wheelchair)?: A Little Help needed standing up from a chair using your arms (e.g., wheelchair or bedside chair)?: A Little Help needed to walk in hospital room?: A Little Help needed climbing 3-5 steps with a railing? : A Lot 6 Click Score: 19    End of Session Equipment Utilized During Treatment: Oxygen Activity Tolerance: Patient limited by fatigue Patient left: in chair;with call bell/phone within reach Nurse Communication: Mobility  status PT Visit Diagnosis: Other abnormalities of gait and mobility (R26.89)     Time: 6440-3474 PT Time Calculation (min) (ACUTE ONLY): 17 min  Charges:  $Therapeutic Exercise: 8-22 mins                     Aida Raider, PT  Office # [phone] Pager [phone]    Ilda Foil 03/06/2020, 1:05 PM

## 2020-03-06 NOTE — Progress Notes (Signed)
PROGRESS NOTE                                                                                                                                                                                                             Patient Demographics:    Gordon Novak, is a 85 y.o. male, DOB - January 20, 1935, JXB:[id number]  Outpatient Primary MD for the patient is [name], MD   Admit date - 02/15/2020   LOS - 20   Brief Narrative: Patient is a 85 y.o. male with PMHx of CAD s/p PCI 2020, DM-2, HLD, HTN, history of PE no longer on anticoagulation-presented with shortness of breath-found to have severe hypoxemia requiring high flow oxygen-due to COVID-19 pneumonia.  COVID-19 vaccinated status: Vaccinated  Significant Events: 9/21>> Admit to [facility] for hypoxia due to COVID-19 pneumonia  Significant studies: 9/21>>Chest x-ray: Diffuse lung infiltrates 9/22>> CTA chest: No PE-severe bilateral patchy/groundglass appearing infiltrates, cholelithiasis 9/22>> bilateral lower extremity Doppler: No DVT 9/22>> Echo: EF 55-60%, RV systolic function normal. 9/30>> chest x-ray: Patchy bilateral airspace disease-not significantly changed from prior.  COVID-19 medications: Steroids: 9/21>> Remdesivir: 9/21>> 9/25 Baricitinib: 9/21>>  Antibiotics: Rocephin: 9/22>> 9/26 Zithromax: 9/22>> 9/26  Microbiology data: 9/21 >>blood culture: No growth  Procedures: None  Consults: Cardiology  DVT prophylaxis: enoxaparin (LOVENOX) injection 40 mg at twice daily dosing (intermediate dosing) SCDs Start: 02/15/20 1300    Subjective:   Patient in bed, appears comfortable, denies any headache, no fever, no chest pain or pressure, no shortness of breath , no abdominal pain. No focal weakness.    Assessment  & Plan :   Acute Hypoxic Resp Failure due to Covid 19 Viral pneumonia - Patient was fully vaccinated with the Pfizer vaccine.  He is  being treated with combination of IV steroids, Remdesivir and Baricitinib, had incurred severe parenchymal lung injury, clinical deterioration seem to have stabilized, still tenuous but slightly improved.  Continue to monitor clinical course, encouraged to sit up in chair in daytime use I-S and flutter valve in daytime.  SpO2: 93 % O2 Flow Rate (L/min): 2 L/min FiO2 (%): 50 %   Recent Labs  Lab 03/01/20 0726 03/03/20 0407 03/04/20 0433 03/04/20 1440 03/05/20 0656 03/06/20 0553  WBC 10.4  --  8.6  --  8.3 6.8  DDIMER 0.85*  --   --   --  0.83* 0.69*  BNP  --   --   --  44.5 45.8 42.0  AST  --  36  --   --   --   --   ALT  --  67*  --   --   --   --   ALKPHOS  --  74  --   --   --   --   BILITOT  --  0.8  --   --   --   --   ALBUMIN  --  2.1*  --   --   --   --        Some vague Oral numbness ongoing for several weeks according to the patient.  Repeat head CT.  Will not be able to tolerate MRI at this point.    Pneumomediastinum -  Noted on CT scan.  Seems to be stable. Repeat Chest x-ray stable.  Elevated troponins - Minimally elevated troponins with flat trend. Not consistent with ACS. Echo with preserved EF.  Cardiology evaluation completed. No recommendations apart from supportive care.    Minimally elevated D-dimer - Has a history of PE in 2020 and completed 1 year treatment with Xarelto in May 2021.  CT angiogram chest and Doppler studies negative for VTE.  Continue intermediate dosing with Lovenox.  D-dimer has been improving.  Transaminitis Improved.  Likely secondary to COVID-19. Recheck in the outpatient setting.  Chronic Diastolic heart failure -  Echocardiogram done during this hospitalization shows normal systolic function.  Strict ins and outs and daily weights.  Furosemide being continued at a lower dose of 20 mg daily.  Dose was decreased due to low blood pressures.  Continue to monitor electrolytes.      History of CAD - Stable.  Seen by cardiology initially due  to elevated troponin.  Currently without any chest pain.  Continue medical management.  On aspirin beta-blocker and statin.    Gout - Continue allopurinol  BPH - Continue Flomax  Chronic left lower extremity swelling - At baseline. Lower extremity Dopplers were negative.  Compression stockings.  Constipation - Patient keeps thinking that he has not had a bowel movement but nurses report multiple bowel movements.  Continue MiraLAX.  Cholelithiasis - Incidentally noted on ultrasound of the abdomen.  Asymptomatic.  DM-2 (A1c 7.1 on 9/21) Noted to have hypoglycemia on 9/30 and dose of Levemir was decreased.  CBGs are noted to be stable.  Continue current dose of Levemir along with SSI. CBG (last 3)  Recent Labs    03/05/20 1213 03/05/20 2202 03/06/20 0741  GLUCAP 194* 131* 140*     DVT prophylaxis: Lovenox  Family Communication  :  Daughter [name] [phone]).  Message left on 03/04/2020 at 2:03 PM, updated 03/05/2020 at 11:58 AM, 03/06/2020 message left at 11:55 AM.  Code Status : DNR  Diet :  Diet Order            Diet heart healthy/carb modified Room service appropriate? No; Fluid consistency: Thin  Diet effective now                  Disposition Plan  : Home health recommended by PT/OT  Status is: Inpatient  Remains inpatient appropriate because:Inpatient level of care appropriate due to severity of illness   Dispo: The patient is from: Home              Anticipated d/c is to: Home  Anticipated d/c date is: > 3 days              Patient currently is not medically stable to d/c.  Barriers to discharge: Continues to have high oxygen requirements  Antimicorbials  :    Anti-infectives (From admission, onward)   Start     Dose/Rate Route Frequency Ordered Stop   02/16/20 1330  azithromycin (ZITHROMAX) 500 mg in sodium chloride 0.9 % 250 mL IVPB        500 mg 250 mL/hr over 60 Minutes Intravenous Every 24 hours 02/16/20 1237 02/20/20 1320   02/16/20  1300  cefTRIAXone (ROCEPHIN) 2 g in sodium chloride 0.9 % 100 mL IVPB        2 g 200 mL/hr over 30 Minutes Intravenous Every 24 hours 02/16/20 1237 02/20/20 1252   02/16/20 1000  remdesivir 100 mg in sodium chloride 0.9 % 100 mL IVPB       "Followed by" Linked Group Details   100 mg 200 mL/hr over 30 Minutes Intravenous Daily 02/15/20 1301 02/19/20 0855   02/15/20 1400  remdesivir 200 mg in sodium chloride 0.9% 250 mL IVPB       "Followed by" Linked Group Details   200 mg 580 mL/hr over 30 Minutes Intravenous Once 02/15/20 1301 02/15/20 1504      Inpatient Medications  Scheduled Meds:  acyclovir ointment   Topical Q3H   allopurinol  300 mg Oral Daily   vitamin C  500 mg Oral Daily   aspirin EC  81 mg Oral Daily   chlorhexidine  15 mL Mouth Rinse BID   enoxaparin (LOVENOX) injection  40 mg Subcutaneous BID   furosemide  20 mg Oral Daily   insulin aspart  0-15 Units Subcutaneous TID WC   insulin detemir  5 Units Subcutaneous Daily   mouth rinse  15 mL Mouth Rinse q12n4p   methylPREDNISolone (SOLU-MEDROL) injection  40 mg Intravenous Q12H   mometasone-formoterol  2 puff Inhalation BID   pantoprazole  40 mg Oral Daily   polyethylene glycol  17 g Oral BID   prednisoLONE acetate  1 drop Both Eyes QHS   rosuvastatin  10 mg Oral Daily   senna-docusate  2 tablet Oral QHS   tamsulosin  0.4 mg Oral QHS   zinc sulfate  220 mg Oral Daily   Continuous Infusions:  sodium chloride 10 mL/hr at 02/17/20 0500   PRN Meds:.acetaminophen, bisacodyl, chlorpheniramine-HYDROcodone, guaiFENesin-dextromethorphan, [DISCONTINUED] ondansetron **OR** ondansetron (ZOFRAN) IV, sodium phosphate   See all Orders from today for further details   Susa Raring M.D on 03/06/2020 at 11:52 AM  To page go to [email or web address] - use universal password  [facility] -  Office  [phone]    Objective:   Vitals:   03/06/20 0200 03/06/20 0452 03/06/20 0558 03/06/20 0701  BP:  (!)  104/51    Pulse: 88 92 98   Resp:  16    Temp:  97.8 F (36.6 C)    TempSrc:  Oral    SpO2: 93% 93% 94% 93%  Weight:      Height:        Wt Readings from Last 3 Encounters:  03/01/20 75.3 kg  02/09/20 83.9 kg  08/30/19 83 kg     Intake/Output Summary (Last 24 hours) at 03/06/2020 1152 Last data filed at 03/06/2020 0818 Gross per 24 hour  Intake 300 ml  Output 700 ml  Net -400 ml     Physical Exam  Awake Alert, No  new F.N deficits, Normal affect [place].AT,PERRAL Supple Neck,No JVD, No cervical lymphadenopathy appriciated.  Symmetrical Chest wall movement, Good air movement bilaterally, CTAB RRR,No Gallops, Rubs or new Murmurs, No Parasternal Heave +ve B.Sounds, Abd Soft, No tenderness, No organomegaly appriciated, No rebound - guarding or rigidity. No Cyanosis, Clubbing or edema, No new Rash or bruise     Data Review:    CBC Recent Labs  Lab 03/01/20 0726 03/04/20 0433 03/05/20 0656 03/06/20 0553  WBC 10.4 8.6 8.3 6.8  HGB 13.2 11.5* 11.2* 10.5*  HCT 39.5 33.6* 33.3* 31.5*  PLT 174 128* 111* 102*  MCV 93.2 92.6 93.3 92.4  MCH 31.1 31.7 31.4 30.8  MCHC 33.4 34.2 33.6 33.3  RDW 13.1 12.8 13.0 13.2  LYMPHSABS  --   --  1.1 0.9  MONOABS  --   --  0.4 0.3  EOSABS  --   --  0.0 0.0  BASOSABS  --   --  0.0 0.0    Chemistries  Recent Labs  Lab 03/02/20 0337 03/03/20 0407 03/04/20 0433 03/05/20 0656 03/06/20 0553  NA 135 136 137 137 136  K 4.1 4.6 4.2 4.5 4.2  CL 101 100 102 101 102  CO2 29 28 28 29 27   GLUCOSE 114* 176* 117* 164* 187*  BUN 22 23 19 15 17   CREATININE 0.81 0.95 0.77 0.74 0.80  CALCIUM 7.7* 7.9* 7.6* 7.6* 7.6*  MG  --   --   --  2.2 2.1  AST  --  36  --   --   --   ALT  --  67*  --   --   --   ALKPHOS  --  74  --   --   --   BILITOT  --  0.8  --   --   --      Recent Labs    03/05/20 0656 03/06/20 0553  DDIMER 0.83* 0.69*        Component Value Date/Time   BNP 42.0 03/06/2020 0553    Micro Results No results found  for this or any previous visit (from the past 240 hour(s)).  Radiology Reports CT Head Wo Contrast  Result Date: 02/09/2020 CLINICAL DATA:  Trauma, fall on blood thinners EXAM: CT HEAD WITHOUT CONTRAST CT CERVICAL SPINE WITHOUT CONTRAST TECHNIQUE: Multidetector CT imaging of the head and cervical spine was performed following the standard protocol without intravenous contrast. Multiplanar CT image reconstructions of the cervical spine were also generated. COMPARISON:  None. FINDINGS: CT HEAD FINDINGS Brain: No acute territorial infarction, hemorrhage, or intracranial mass. Moderate atrophy. Mild hypodensity in the white matter consistent with chronic small vessel ischemic change. The ventricles are nonenlarged. Vascular: No hyperdense vessels.  Carotid vascular calcification. Skull: No depressed skull fracture. Sinuses/Orbits: Mucosal thickening in the maxillary and ethmoid sinuses. Small fluid level in the left sphenoid sinus but no central skull base lucency is seen. Possible chronic left nasal bone fracture. Other: Mild right posterior scalp swelling. CT CERVICAL SPINE FINDINGS Alignment: No subluxation.  Facet alignment within normal limits. Skull base and vertebrae: No acute fracture. No primary bone lesion or focal pathologic process. Soft tissues and spinal canal: No prevertebral fluid or swelling. No visible canal hematoma. Disc levels: Multiple level degenerative change with moderate severe disease at C5-C6 and C6-C7. Facet degenerative changes at multiple levels with foraminal narrowing. Upper chest: Apical scarring Other: None IMPRESSION: 1. No CT evidence for acute intracranial abnormality. Atrophy and mild chronic small vessel ischemic changes  of the white matter. 2. Degenerative changes of the cervical spine. No acute osseous abnormality. Electronically Signed   By: Jasmine PangKim  Fujinaga M.D.   On: 02/09/2020 01:13   CT CHEST W CONTRAST  Result Date: 03/01/2020 CLINICAL DATA:  COVID pneumonia. EXAM:  CT CHEST WITH CONTRAST TECHNIQUE: Multidetector CT imaging of the chest was performed during intravenous contrast administration. CONTRAST:  65mL OMNIPAQUE IOHEXOL 300 MG/ML  SOLN COMPARISON:  Chest CT 02/16/2020 FINDINGS: Cardiovascular: The heart is normal in size. No pericardial effusion. The aorta is normal in caliber. No dissection or focal aneurysm. Stable moderate atherosclerotic calcifications. The branch vessels are patent. Stable three-vessel coronary artery calcifications. The pulmonary arteries are fairly well opacified. No filling defects to suggest pulmonary embolism. Mediastinum/Nodes: Fairly extensive mediastinal air. This is dissecting up into the neck and all along the chest wall. Mild pneumopericardium also noted. Gas is dissecting into the intercostal muscles. There is a small left basilar pneumothorax and probable small anterior pneumothoraces but no large pneumothorax requiring chest tube placement. The esophagus is grossly normal. Lungs/Pleura: Vague diffuse patchy ground-glass infiltrates consistent with known COVID pneumonia. Overall definite improvement since the prior CT scan. Upper Abdomen: No significant upper abdominal findings. Numerous gallstones are again noted in the gallbladder. Musculoskeletal: No acute bony findings. Remote healed right rib fractures are noted. IMPRESSION: 1. Fairly extensive mediastinal air dissecting up into the neck and all along the chest wall. 2. Small left basilar pneumothorax and probable small anterior pneumothoraces but no large pneumothorax. 3. Persistent diffuse patchy ground-glass infiltrates consistent with known COVID pneumonia. Overall definite improvement since the prior CT scan. 4. Stable three-vessel coronary artery calcifications. 5. Cholelithiasis. 6. Aortic atherosclerosis. Aortic Atherosclerosis (ICD10-I70.0). Electronically Signed   By: Rudie MeyerP.  Gallerani M.D.   On: 03/01/2020 14:30   CT ANGIO CHEST PE W OR WO CONTRAST  Result Date:  02/16/2020 CLINICAL DATA:  COVID positive peer EXAM: CT ANGIOGRAPHY CHEST WITH CONTRAST TECHNIQUE: Multidetector CT imaging of the chest was performed using the standard protocol during bolus administration of intravenous contrast. Multiplanar CT image reconstructions and MIPs were obtained to evaluate the vascular anatomy. CONTRAST:  100mL OMNIPAQUE IOHEXOL 350 MG/ML SOLN COMPARISON:  Oct 22, 2018 FINDINGS: Cardiovascular: There is moderate severity calcification of the thoracic aorta. Satisfactory opacification of the pulmonary arteries to the segmental level. No evidence of pulmonary embolism. Normal heart size. No pericardial effusion. Mediastinum/Nodes: No enlarged mediastinal, hilar, or axillary lymph nodes. Thyroid gland, trachea, and esophagus demonstrate no significant findings. Lungs/Pleura: Marked severity patchy and ground-glass appearing infiltrates are seen throughout both lungs. There is no evidence of a pleural effusion or pneumothorax. Upper Abdomen: Numerous subcentimeter gallstones are seen within the lumen of an otherwise normal-appearing gallbladder. Musculoskeletal: No chest wall abnormality. No acute or significant osseous findings. Review of the MIP images confirms the above findings. IMPRESSION: 1. Marked severity bilateral patchy and ground-glass appearing infiltrates. 2. Cholelithiasis. 3. Aortic atherosclerosis. Aortic Atherosclerosis (ICD10-I70.0). Electronically Signed   By: Aram Candelahaddeus  Houston M.D.   On: 02/16/2020 22:13   CT Cervical Spine Wo Contrast  Result Date: 02/09/2020 CLINICAL DATA:  Trauma, fall on blood thinners EXAM: CT HEAD WITHOUT CONTRAST CT CERVICAL SPINE WITHOUT CONTRAST TECHNIQUE: Multidetector CT imaging of the head and cervical spine was performed following the standard protocol without intravenous contrast. Multiplanar CT image reconstructions of the cervical spine were also generated. COMPARISON:  None. FINDINGS: CT HEAD FINDINGS Brain: No acute territorial  infarction, hemorrhage, or intracranial mass. Moderate atrophy. Mild hypodensity in the white  matter consistent with chronic small vessel ischemic change. The ventricles are nonenlarged. Vascular: No hyperdense vessels.  Carotid vascular calcification. Skull: No depressed skull fracture. Sinuses/Orbits: Mucosal thickening in the maxillary and ethmoid sinuses. Small fluid level in the left sphenoid sinus but no central skull base lucency is seen. Possible chronic left nasal bone fracture. Other: Mild right posterior scalp swelling. CT CERVICAL SPINE FINDINGS Alignment: No subluxation.  Facet alignment within normal limits. Skull base and vertebrae: No acute fracture. No primary bone lesion or focal pathologic process. Soft tissues and spinal canal: No prevertebral fluid or swelling. No visible canal hematoma. Disc levels: Multiple level degenerative change with moderate severe disease at C5-C6 and C6-C7. Facet degenerative changes at multiple levels with foraminal narrowing. Upper chest: Apical scarring Other: None IMPRESSION: 1. No CT evidence for acute intracranial abnormality. Atrophy and mild chronic small vessel ischemic changes of the white matter. 2. Degenerative changes of the cervical spine. No acute osseous abnormality. Electronically Signed   By: Jasmine Pang M.D.   On: 02/09/2020 01:13   DG Chest Port 1 View  Result Date: 03/03/2020 CLINICAL DATA:  COVID-19 pneumonia EXAM: PORTABLE CHEST 1 VIEW COMPARISON:  03/01/2020 FINDINGS: Was 624 hours. The cardiopericardial silhouette is within normal limits for size. Diffuse bilateral airspace disease is similar. Extensive pneumomediastinum and subcutaneous emphysema in the upper chest again noted. No substantial pleural effusion. The visualized bony structures of the thorax show no acute abnormality. Telemetry leads overlie the chest. IMPRESSION: No substantial change in exam. Diffuse bilateral airspace disease with extensive pneumomediastinum and  subcutaneous emphysema in the upper chest. No pneumothorax. Electronically Signed   By: Kennith Center M.D.   On: 03/03/2020 07:42   DG Chest Port 1 View  Result Date: 03/01/2020 CLINICAL DATA:  Pneumonia due to COVID-19 EXAM: PORTABLE CHEST 1 VIEW COMPARISON:  Two days ago FINDINGS: Greater chest wall emphysema. Pneumomediastinum. There is biapical extrapleural gas versus trace pneumothorax, unchanged. Bilateral pulmonary infiltrate. Normal heart size. IMPRESSION: 1. Progressive chest wall emphysema. 2. Small biapical pneumothorax versus extrapleural gas, interval stability favoring the latter. 3. Stable infiltrates. Electronically Signed   By: Marnee Spring M.D.   On: 03/01/2020 07:13   DG CHEST PORT 1 VIEW  Result Date: 02/28/2020 CLINICAL DATA:  Respiratory failure, COVID-19 positive, chest tightness EXAM: PORTABLE CHEST 1 VIEW COMPARISON:  02/24/2020 FINDINGS: Stable cardiomediastinal contours. Atherosclerotic calcification of the aortic knob. Subtle airspace opacities bilaterally with a predominantly peripheral and basilar distribution, not appreciably changed from prior. No pleural effusion. No evidence of pneumothorax. Interval development of subcutaneous emphysema most pronounced at the base of the right neck and within the right axillary region. IMPRESSION: 1. Interval development of subcutaneous emphysema at the base of the right neck and within the right axillary region. No evidence of pneumothorax. Correlate with physical exam and for history of attempted central line placement. 2. Stable bilateral peripheral and basilar airspace opacities. Electronically Signed   By: Duanne Guess D.O.   On: 02/28/2020 13:07   DG Chest Port 1 View  Result Date: 02/15/2020 CLINICAL DATA:  COVID positive.  Hypoxia. EXAM: PORTABLE CHEST 1 VIEW COMPARISON:  Oct 22, 2018 FINDINGS: Diffuse bilateral interstitial and airspace opacities, new from prior. No visible pleural effusion. No pneumothorax. Cardiac  silhouette is accentuated by low lung volumes and portable technique. Aortic atherosclerosis. IMPRESSION: Diffuse bilateral interstitial and airspace opacities, likely multifocal pneumonia given the clinical history. Electronically Signed   By: Feliberto Harts MD   On: 02/15/2020 10:56  DG Chest Port 1V same Day  Result Date: 02/24/2020 CLINICAL DATA:  COVID EXAM: PORTABLE CHEST 1 VIEW COMPARISON:  02/15/2020 FINDINGS: Bilateral patchy airspace opacities are again noted, similar prior study. Slight increase in lung volumes. Heart is normal size. No effusions or pneumothorax. No acute bony abnormality. IMPRESSION: Patchy bilateral airspace disease, not significantly changed since prior study. Slight increased lung volumes. Electronically Signed   By: Charlett Nose M.D.   On: 02/24/2020 08:31   VAS [place] LOWER EXTREMITY VENOUS (DVT)  Result Date: 02/16/2020  Lower Venous DVTStudy Indications: Swelling, and elevated d dimer.  Risk Factors: + covid. Performing Technologist: Jannet Askew RCT RDMS  Examination Guidelines: A complete evaluation includes B-mode imaging, spectral Doppler, color Doppler, and power Doppler as needed of all accessible portions of each vessel. Bilateral testing is considered an integral part of a complete examination. Limited examinations for reoccurring indications may be performed as noted. The reflux portion of the exam is performed with the patient in reverse Trendelenburg.  +---------+---------------+---------+-----------+----------+--------------+  RIGHT     Compressibility Phasicity Spontaneity Properties Thrombus Aging  +---------+---------------+---------+-----------+----------+--------------+  CFV       Full            Yes       Yes                                    +---------+---------------+---------+-----------+----------+--------------+  SFJ       Full                                                              +---------+---------------+---------+-----------+----------+--------------+  FV Prox   Full                                                             +---------+---------------+---------+-----------+----------+--------------+  FV Mid    Full                                                             +---------+---------------+---------+-----------+----------+--------------+  FV Distal Full                                                             +---------+---------------+---------+-----------+----------+--------------+  PFV       Full                                                             +---------+---------------+---------+-----------+----------+--------------+  POP  Full            Yes       Yes                                    +---------+---------------+---------+-----------+----------+--------------+  PTV       Full                                                             +---------+---------------+---------+-----------+----------+--------------+  PERO      Full                                                             +---------+---------------+---------+-----------+----------+--------------+   +---------+---------------+---------+-----------+----------+--------------+  LEFT      Compressibility Phasicity Spontaneity Properties Thrombus Aging  +---------+---------------+---------+-----------+----------+--------------+  CFV       Full            Yes       Yes                                    +---------+---------------+---------+-----------+----------+--------------+  SFJ       Full                                                             +---------+---------------+---------+-----------+----------+--------------+  FV Prox   Full                                                             +---------+---------------+---------+-----------+----------+--------------+  FV Mid    Full                                                              +---------+---------------+---------+-----------+----------+--------------+  FV Distal Full                                                             +---------+---------------+---------+-----------+----------+--------------+  PFV       Full                                                             +---------+---------------+---------+-----------+----------+--------------+  POP       Full            Yes       Yes                                    +---------+---------------+---------+-----------+----------+--------------+  PTV       Full                                                             +---------+---------------+---------+-----------+----------+--------------+  PERO      Full                                                             +---------+---------------+---------+-----------+----------+--------------+     Summary: RIGHT: - There is no evidence of deep vein thrombosis in the lower extremity.  - No cystic structure found in the popliteal fossa.  LEFT: - There is no evidence of deep vein thrombosis in the lower extremity.  - No cystic structure found in the popliteal fossa.  *See table(s) above for measurements and observations. Electronically signed by VanceCoral ElseD on 02/16/2020 at 9:33:24 PM.    Final    ECHOCARDIOGRAM LIMITED  Result Date: 02/16/2020    ECHOCARDIOGRAM LIMITED REPORT   Patient Name:   Gordon Novak Date of Exam: 02/16/2020 Medical Rec #:  [id number]       Height:       72.0 in Accession #:    [id number]      Weight:       183.0 lb Date of Birth:  1934/06/13       BSA:          2.052 m Patient Age:    84 years        BP:           114/53 mmHg Patient Gender: M               HR:           77 bpm. Exam Location:  Inpatient Procedure: Limited Echo, Cardiac Doppler and Color Doppler Indications:    Elevated Troponin  History:        Patient has prior history of Echocardiogram examinations, most                 recent 02/17/2019. CHF and Cardiomyopathy, CAD, Arrythmias:Atrial                  Fibrillation, Signs/Symptoms:Shortness of Breath and Fever; Risk                 Factors:Hypertension, Dyslipidemia, OBESITY and Diabetes.                 COVID+.  Sonographer:    Lavenia Atlas Referring Phys: [id number] Kasandra Knudsen PAHWANI IMPRESSIONS  1. Left ventricular ejection fraction, by estimation, is 55 to 60%.  2. Right ventricular systolic function is normal. The right ventricular size is normal. There is normal pulmonary artery systolic pressure.  3. The interatrial septum is  aneuyrsmal with frequent bowing from right to left.  4. The mitral valve is normal in structure. Trivial mitral valve regurgitation.  5. There is mild calcification of the aortic valve. There is mild thickening of the aortic valve. Mild aortic valve sclerosis is present, with no evidence of aortic valve stenosis. Comparison(s): Compared to prior echo on 01/2019, the LVEF appears mildly improved to 55-60%. FINDINGS  Left Ventricle: Septal motion is abnormal consistent with prior surgery. Left ventricular ejection fraction, by estimation, is 55 to 60%. The left ventricular internal cavity size was normal in size. There is no left ventricular hypertrophy. Right Ventricle: The right ventricular size is normal. Right ventricular systolic function is normal. There is normal pulmonary artery systolic pressure. The tricuspid regurgitant velocity is 2.41 m/s, and with an assumed right atrial pressure of 3 mmHg,  the estimated right ventricular systolic pressure is 26.2 mmHg. Left Atrium: Left atrial size was normal in size. Pericardium: There is no evidence of pericardial effusion. Mitral Valve: The mitral valve is normal in structure. There is mild thickening of the mitral valve leaflet(s). Mild mitral annular calcification. Trivial mitral valve regurgitation. Tricuspid Valve: The tricuspid valve is normal in structure. Tricuspid valve regurgitation is mild. Aortic Valve: There is mild calcification of the aortic valve. There is  mild thickening of the aortic valve. Mild aortic valve sclerosis is present, with no evidence of aortic valve stenosis. Pulmonic Valve: The pulmonic valve was not assessed. Aorta: The aortic root is normal in size and structure. LEFT VENTRICLE PLAX 2D LVIDd:         3.80 cm  Diastology LVIDs:         2.70 cm  LV e' medial:    7.51 cm/s LV PW:         1.20 cm  LV E/e' medial:  7.7 LV IVS:        0.90 cm  LV e' lateral:   5.98 cm/s LVOT diam:     1.90 cm  LV E/e' lateral: 9.7 LVOT Area:     2.84 cm  RIGHT VENTRICLE RV S prime:     13.30 cm/s LEFT ATRIUM         Index LA diam:    2.80 cm 1.36 cm/m   AORTA Ao Root diam: 2.50 cm MITRAL VALVE               TRICUSPID VALVE MV Area (PHT): 5.38 cm    TR Peak grad:   23.2 mmHg MV Decel Time: 141 msec    TR Vmax:        241.00 cm/s MV E velocity: 57.80 cm/s MV A velocity: 70.30 cm/s  SHUNTS MV E/A ratio:  0.82        Systemic Diam: 1.90 cm Laurance Flatten MD Electronically signed by Laurance Flatten MD Signature Date/Time: 02/16/2020/4:23:30 PM    Final    ABORTED INVASIVE LAB PROCEDURE  Result Date: 02/15/2020 This case was aborted.  US Abdomen Limited RUQ  Result Date: 02/15/2020 CLINICAL DATA:  Elevated liver enzymes. EXAM: ULTRASOUND ABDOMEN LIMITED RIGHT UPPER QUADRANT COMPARISON:  CT abdomen 07/06/2019. FINDINGS: Gallbladder: Gallstones measuring up to 7 mm noted. Gallbladder wall thickness normal. No Murphy sign. Common bile duct: Diameter: Not visualized due to overlying bowel gas. Liver: Left no not visualized due to overlying bowel gas. No focal lesion identified. Within normal limits in parenchymal echogenicity. Portal vein is patent on color Doppler imaging with normal direction of blood flow towards the liver. Other: Limited study  due to overlying bowel gas. IMPRESSION: 1.  Multiple small gallstones.  No gallbladder wall thickening. 2. Limited exam due to overlying bowel gas. Common bile duct not visualized. Portions of the liver not visualized.  Electronically Signed   By: Maisie Fus  Register   On: 02/15/2020 15:35

## 2020-03-06 NOTE — Progress Notes (Signed)
Patient up in chair for 2.5 hours this shift. Patient tolerated O2 2L while up in chair. Patient assisted to [facility]. SpO2 dropped quickly to as low as 79% on 2L, so O2 increased to 4L temporarily. SpO2 increased quickly to 86-88%. Once patient resting in bed and SpO2 >92%, O2 decreased back to 2L. Patient resting in bed at this time, no distress noted. SpO2 98% on O2 2L [place]. Will continue to monitor.

## 2020-03-07 DIAGNOSIS — U071 COVID-19: Secondary | ICD-10-CM | POA: Diagnosis not present

## 2020-03-07 DIAGNOSIS — J9601 Acute respiratory failure with hypoxia: Secondary | ICD-10-CM | POA: Diagnosis not present

## 2020-03-07 LAB — CBC WITH DIFFERENTIAL/PLATELET
Abs Immature Granulocytes: 0.06 10*3/uL (ref 0.00–0.07)
Basophils Absolute: 0 10*3/uL (ref 0.0–0.1)
Basophils Relative: 0 %
Eosinophils Absolute: 0 10*3/uL (ref 0.0–0.5)
Eosinophils Relative: 0 %
HCT: 32 % — ABNORMAL LOW (ref 39.0–52.0)
Hemoglobin: 11.1 g/dL — ABNORMAL LOW (ref 13.0–17.0)
Immature Granulocytes: 1 %
Lymphocytes Relative: 10 %
Lymphs Abs: 0.6 10*3/uL — ABNORMAL LOW (ref 0.7–4.0)
MCH: 32.1 pg (ref 26.0–34.0)
MCHC: 34.7 g/dL (ref 30.0–36.0)
MCV: 92.5 fL (ref 80.0–100.0)
Monocytes Absolute: 0.2 10*3/uL (ref 0.1–1.0)
Monocytes Relative: 4 %
Neutro Abs: 5.6 10*3/uL (ref 1.7–7.7)
Neutrophils Relative %: 85 %
Platelets: 96 10*3/uL — ABNORMAL LOW (ref 150–400)
RBC: 3.46 MIL/uL — ABNORMAL LOW (ref 4.22–5.81)
RDW: 13.6 % (ref 11.5–15.5)
WBC: 6.5 10*3/uL (ref 4.0–10.5)
nRBC: 0 % (ref 0.0–0.2)

## 2020-03-07 LAB — BASIC METABOLIC PANEL
Anion gap: 7 (ref 5–15)
BUN: 17 mg/dL (ref 8–23)
CO2: 28 mmol/L (ref 22–32)
Calcium: 7.7 mg/dL — ABNORMAL LOW (ref 8.9–10.3)
Chloride: 102 mmol/L (ref 98–111)
Creatinine, Ser: 0.83 mg/dL (ref 0.61–1.24)
GFR, Estimated: >60 mL/min (ref >60)
Glucose, Bld: 152 mg/dL — ABNORMAL HIGH (ref 70–99)
Potassium: 4.2 mmol/L (ref 3.5–5.1)
Sodium: 137 mmol/L (ref 135–145)

## 2020-03-07 LAB — GLUCOSE, CAPILLARY: Glucose-Capillary: 157 mg/dL — ABNORMAL HIGH (ref 70–99)

## 2020-03-07 LAB — D-DIMER, QUANTITATIVE: D-Dimer, Quant: 0.68 ug/mL-FEU — ABNORMAL HIGH (ref 0.00–0.50)

## 2020-03-07 LAB — BRAIN NATRIURETIC PEPTIDE: B Natriuretic Peptide: 41.4 pg/mL (ref 0.0–100.0)

## 2020-03-07 LAB — MAGNESIUM: Magnesium: 2.1 mg/dL (ref 1.7–2.4)

## 2020-03-07 NOTE — Progress Notes (Signed)
PROGRESS NOTE                                                                                                                                                                                                             Patient Demographics:    Gordon Novak, is a 85 y.o. male, DOB - 29-Oct-1934, XBJ:[id number]  Outpatient Primary MD for the patient is [name], MD   Admit date - 02/15/2020   LOS - 21   Brief Narrative: Patient is a 85 y.o. male with PMHx of CAD s/p PCI 2020, DM-2, HLD, HTN, history of PE no longer on anticoagulation-presented with shortness of breath-found to have severe hypoxemia requiring high flow oxygen-due to COVID-19 pneumonia.  COVID-19 vaccinated status: Vaccinated  Significant Events: 9/21>> Admit to [facility] for hypoxia due to COVID-19 pneumonia  Significant studies: 9/21>>Chest x-ray: Diffuse lung infiltrates 9/22>> CTA chest: No PE-severe bilateral patchy/groundglass appearing infiltrates, cholelithiasis 9/22>> bilateral lower extremity Doppler: No DVT 9/22>> Echo: EF 55-60%, RV systolic function normal. 9/30>> chest x-ray: Patchy bilateral airspace disease-not significantly changed from prior.  COVID-19 medications: Steroids: 9/21>> Remdesivir: 9/21>> 9/25 Baricitinib: 9/21>>  Antibiotics: Rocephin: 9/22>> 9/26 Zithromax: 9/22>> 9/26  Microbiology data: 9/21 >>blood culture: No growth  Procedures: None  Consults: Cardiology  DVT prophylaxis: enoxaparin (LOVENOX) injection 40 mg at twice daily dosing (intermediate dosing) SCDs Start: 02/15/20 1300    Subjective:   Patient in bed, appears comfortable, denies any headache, no fever, no chest pain or pressure, no shortness of breath , no abdominal pain. No focal weakness.   Assessment  & Plan :   Acute Hypoxic Resp Failure due to Covid 19 Viral pneumonia - Patient was fully vaccinated with the Pfizer vaccine.  He is being  treated with combination of IV steroids, Remdesivir and Baricitinib, had incurred severe parenchymal lung injury, clinical deterioration seem to have stabilized, still tenuous but slightly improved.  Continue to monitor clinical course, encouraged to sit up in chair in daytime use I-S and flutter valve in daytime.  SpO2: 99 % O2 Flow Rate (L/min): 5 L/min FiO2 (%): 50 %   Recent Labs  Lab 03/01/20 0726 03/03/20 0407 03/04/20 0433 03/04/20 1440 03/05/20 0656 03/06/20 0553 03/07/20 0442  WBC 10.4  --  8.6  --  8.3 6.8 6.5  DDIMER 0.85*  --   --   --  0.83* 0.69* 0.68*  BNP  --   --   --  44.5 45.8 42.0 41.4  AST  --  36  --   --   --   --   --   ALT  --  67*  --   --   --   --   --   ALKPHOS  --  74  --   --   --   --   --   BILITOT  --  0.8  --   --   --   --   --   ALBUMIN  --  2.1*  --   --   --   --   --        Some vague Oral numbness ongoing for several weeks according to the patient.  Repeat head CT.  Will not be able to tolerate MRI at this point.    Pneumomediastinum -  Noted on CT scan.  Seems to be stable. Repeat Chest x-ray stable.  Elevated troponins - Minimally elevated troponins with flat trend. Not consistent with ACS. Echo with preserved EF.  Cardiology evaluation completed. No recommendations apart from supportive care.    Minimally elevated D-dimer - Has a history of PE in 2020 and completed 1 year treatment with Xarelto in May 2021.  CT angiogram chest and Doppler studies negative for VTE.  Continue intermediate dosing with Lovenox.  D-dimer has been improving.  Transaminitis Improved.  Likely secondary to COVID-19. Recheck in the outpatient setting.  Chronic Diastolic heart failure -  Echocardiogram done during this hospitalization shows normal systolic function.  Strict ins and outs and daily weights.  Furosemide being continued at a lower dose of 20 mg daily.  Dose was decreased due to low blood pressures.  Continue to monitor electrolytes.      History of  CAD - Stable.  Seen by cardiology initially due to elevated troponin.  Currently without any chest pain.  Continue medical management.  On aspirin beta-blocker and statin.    Gout - Continue allopurinol  BPH - Continue Flomax  Chronic left lower extremity swelling - At baseline. Lower extremity Dopplers were negative.  Compression stockings.  Constipation - Patient keeps thinking that he has not had a bowel movement but nurses report multiple bowel movements.  Continue MiraLAX.  Cholelithiasis - Incidentally noted on ultrasound of the abdomen.  Asymptomatic.  DM-2 (A1c 7.1 on 9/21) Noted to have hypoglycemia on 9/30 and dose of Levemir was decreased.  CBGs are noted to be stable.  Continue current dose of Levemir along with SSI. CBG (last 3)  Recent Labs    03/05/20 2202 03/06/20 0741 03/06/20 1150  GLUCAP 131* 140* 175*     DVT prophylaxis: Lovenox  Family Communication  :  Daughter [name] [phone]).  Message left on 03/04/2020 at 2:03 PM, updated 03/05/2020 at 11:58 AM, 03/06/2020 message left at 11:55 AM.  Code Status : DNR  Diet :  Diet Order            Diet heart healthy/carb modified Room service appropriate? No; Fluid consistency: Thin  Diet effective now                  Disposition Plan  : Home health recommended by PT/OT  Status is: Inpatient  Remains inpatient appropriate because:Inpatient level of care appropriate due to severity of illness   Dispo: The patient is from: Home  Anticipated d/c is to: Home              Anticipated d/c date is: > 3 days              Patient currently is not medically stable to d/c.  Barriers to discharge: Continues to have high oxygen requirements  Antimicorbials  :    Anti-infectives (From admission, onward)   Start     Dose/Rate Route Frequency Ordered Stop   02/16/20 1330  azithromycin (ZITHROMAX) 500 mg in sodium chloride 0.9 % 250 mL IVPB        500 mg 250 mL/hr over 60 Minutes Intravenous Every  24 hours 02/16/20 1237 02/20/20 1320   02/16/20 1300  cefTRIAXone (ROCEPHIN) 2 g in sodium chloride 0.9 % 100 mL IVPB        2 g 200 mL/hr over 30 Minutes Intravenous Every 24 hours 02/16/20 1237 02/20/20 1252   02/16/20 1000  remdesivir 100 mg in sodium chloride 0.9 % 100 mL IVPB       "Followed by" Linked Group Details   100 mg 200 mL/hr over 30 Minutes Intravenous Daily 02/15/20 1301 02/19/20 0855   02/15/20 1400  remdesivir 200 mg in sodium chloride 0.9% 250 mL IVPB       "Followed by" Linked Group Details   200 mg 580 mL/hr over 30 Minutes Intravenous Once 02/15/20 1301 02/15/20 1504      Inpatient Medications  Scheduled Meds: . acyclovir ointment   Topical Q3H  . allopurinol  300 mg Oral Daily  . vitamin C  500 mg Oral Daily  . aspirin EC  81 mg Oral Daily  . chlorhexidine  15 mL Mouth Rinse BID  . enoxaparin (LOVENOX) injection  40 mg Subcutaneous BID  . insulin aspart  0-15 Units Subcutaneous TID WC  . insulin detemir  5 Units Subcutaneous Daily  . mouth rinse  15 mL Mouth Rinse q12n4p  . methylPREDNISolone (SOLU-MEDROL) injection  40 mg Intravenous Q12H  . mometasone-formoterol  2 puff Inhalation BID  . pantoprazole  40 mg Oral Daily  . polyethylene glycol  17 g Oral BID  . prednisoLONE acetate  1 drop Both Eyes QHS  . rosuvastatin  10 mg Oral Daily  . senna-docusate  2 tablet Oral QHS  . tamsulosin  0.4 mg Oral QHS  . zinc sulfate  220 mg Oral Daily   Continuous Infusions: . sodium chloride 10 mL/hr at 02/17/20 0500   PRN Meds:.acetaminophen, bisacodyl, chlorpheniramine-HYDROcodone, guaiFENesin-dextromethorphan, [DISCONTINUED] ondansetron **OR** ondansetron (ZOFRAN) IV, sodium phosphate   See all Orders from today for further details   Susa Raring M.D on 03/07/2020 at 12:05 PM  To page go to [email or web address] - use universal password  [facility] -  Office  [phone]    Objective:   Vitals:   03/06/20 1447 03/06/20 2044 03/07/20 0040 03/07/20  0439  BP: (!) 99/56 (!) 103/55 112/64 109/60  Pulse: 89 90 77 75  Resp: 20 20  19   Temp: 97.8 F (36.6 C) 97.6 F (36.4 C)  98.3 F (36.8 C)  TempSrc: Oral Oral  Oral  SpO2: 98% 93% 99% 99%  Weight:      Height:        Wt Readings from Last 3 Encounters:  03/01/20 75.3 kg  02/09/20 83.9 kg  08/30/19 83 kg     Intake/Output Summary (Last 24 hours) at 03/07/2020 1205 Last data filed at 03/07/2020 0915 Gross per 24 hour  Intake 480 ml  Output 450 ml  Net 30 ml     Physical Exam  Awake Alert, No new F.N deficits, Normal affect [place].AT,PERRAL Supple Neck,No JVD, No cervical lymphadenopathy appriciated.  Symmetrical Chest wall movement, Good air movement bilaterally, CTAB RRR,No Gallops, Rubs or new Murmurs, No Parasternal Heave +ve B.Sounds, Abd Soft, No tenderness, No organomegaly appriciated, No rebound - guarding or rigidity. No Cyanosis, Clubbing or edema, No new Rash or bruise    Data Review:    CBC Recent Labs  Lab 03/01/20 0726 03/04/20 0433 03/05/20 0656 03/06/20 0553 03/07/20 0442  WBC 10.4 8.6 8.3 6.8 6.5  HGB 13.2 11.5* 11.2* 10.5* 11.1*  HCT 39.5 33.6* 33.3* 31.5* 32.0*  PLT 174 128* 111* 102* 96*  MCV 93.2 92.6 93.3 92.4 92.5  MCH 31.1 31.7 31.4 30.8 32.1  MCHC 33.4 34.2 33.6 33.3 34.7  RDW 13.1 12.8 13.0 13.2 13.6  LYMPHSABS  --   --  1.1 0.9 0.6*  MONOABS  --   --  0.4 0.3 0.2  EOSABS  --   --  0.0 0.0 0.0  BASOSABS  --   --  0.0 0.0 0.0    Chemistries  Recent Labs  Lab 03/03/20 0407 03/04/20 0433 03/05/20 0656 03/06/20 0553 03/07/20 0442  NA 136 137 137 136 137  K 4.6 4.2 4.5 4.2 4.2  CL 100 102 101 102 102  CO2 28 28 29 27 28   GLUCOSE 176* 117* 164* 187* 152*  BUN 23 19 15 17 17   CREATININE 0.95 0.77 0.74 0.80 0.83  CALCIUM 7.9* 7.6* 7.6* 7.6* 7.7*  MG  --   --  2.2 2.1 2.1  AST 36  --   --   --   --   ALT 67*  --   --   --   --   ALKPHOS 74  --   --   --   --   BILITOT 0.8  --   --   --   --      Recent Labs     03/06/20 0553 03/07/20 0442  DDIMER 0.69* 0.68*        Component Value Date/Time   BNP 41.4 03/07/2020 0442    Micro Results No results found for this or any previous visit (from the past 240 hour(s)).  Radiology Reports CT HEAD WO CONTRAST  Result Date: 03/06/2020 CLINICAL DATA:  Dizziness, nonspecific. Additional provided: History of dizziness, patient reports headache today. EXAM: CT HEAD WITHOUT CONTRAST TECHNIQUE: Contiguous axial images were obtained from the base of the skull through the vertex without intravenous contrast. COMPARISON:  Prior head CT 02/09/2020. FINDINGS: Brain: Mild generalized cerebral atrophy. Redemonstrated small chronic cortically based infarct within the left middle frontal gyrus (series 3, images 18 and 19). Mild ill-defined hypoattenuation within the cerebral white matter which is nonspecific, but consistent with chronic small vessel ischemic disease. There is no acute intracranial hemorrhage. No acute demarcated cortical infarct. No extra-axial fluid collection. No evidence of intracranial mass. No midline shift. Vascular: No hyperdense vessel.  Atherosclerotic calcifications. Skull: Normal. Negative for fracture or focal lesion. Sinuses/Orbits: Visualized orbits show no acute finding. Scattered fluid within bilateral ethmoid air cells. Left sphenoid sinus air-fluid level. No significant mastoid effusion. IMPRESSION: No CT evidence of acute intracranial abnormality. Redemonstrated small chronic left frontal lobe cortical infarct. Background mild generalized cerebral atrophy and chronic small vessel ischemic disease, stable as compared to the head CT of 02/09/2020. Ethmoid and left sphenoid sinusitis. Electronically Signed   By:  Jackey Loge DO   On: 03/06/2020 15:01   CT Head Wo Contrast  Result Date: 02/09/2020 CLINICAL DATA:  Trauma, fall on blood thinners EXAM: CT HEAD WITHOUT CONTRAST CT CERVICAL SPINE WITHOUT CONTRAST TECHNIQUE: Multidetector CT imaging of  the head and cervical spine was performed following the standard protocol without intravenous contrast. Multiplanar CT image reconstructions of the cervical spine were also generated. COMPARISON:  None. FINDINGS: CT HEAD FINDINGS Brain: No acute territorial infarction, hemorrhage, or intracranial mass. Moderate atrophy. Mild hypodensity in the white matter consistent with chronic small vessel ischemic change. The ventricles are nonenlarged. Vascular: No hyperdense vessels.  Carotid vascular calcification. Skull: No depressed skull fracture. Sinuses/Orbits: Mucosal thickening in the maxillary and ethmoid sinuses. Small fluid level in the left sphenoid sinus but no central skull base lucency is seen. Possible chronic left nasal bone fracture. Other: Mild right posterior scalp swelling. CT CERVICAL SPINE FINDINGS Alignment: No subluxation.  Facet alignment within normal limits. Skull base and vertebrae: No acute fracture. No primary bone lesion or focal pathologic process. Soft tissues and spinal canal: No prevertebral fluid or swelling. No visible canal hematoma. Disc levels: Multiple level degenerative change with moderate severe disease at C5-C6 and C6-C7. Facet degenerative changes at multiple levels with foraminal narrowing. Upper chest: Apical scarring Other: None IMPRESSION: 1. No CT evidence for acute intracranial abnormality. Atrophy and mild chronic small vessel ischemic changes of the white matter. 2. Degenerative changes of the cervical spine. No acute osseous abnormality. Electronically Signed   By: Jasmine Pang M.D.   On: 02/09/2020 01:13   CT CHEST W CONTRAST  Result Date: 03/01/2020 CLINICAL DATA:  COVID pneumonia. EXAM: CT CHEST WITH CONTRAST TECHNIQUE: Multidetector CT imaging of the chest was performed during intravenous contrast administration. CONTRAST:  60mL OMNIPAQUE IOHEXOL 300 MG/ML  SOLN COMPARISON:  Chest CT 02/16/2020 FINDINGS: Cardiovascular: The heart is normal in size. No pericardial  effusion. The aorta is normal in caliber. No dissection or focal aneurysm. Stable moderate atherosclerotic calcifications. The branch vessels are patent. Stable three-vessel coronary artery calcifications. The pulmonary arteries are fairly well opacified. No filling defects to suggest pulmonary embolism. Mediastinum/Nodes: Fairly extensive mediastinal air. This is dissecting up into the neck and all along the chest wall. Mild pneumopericardium also noted. Gas is dissecting into the intercostal muscles. There is a small left basilar pneumothorax and probable small anterior pneumothoraces but no large pneumothorax requiring chest tube placement. The esophagus is grossly normal. Lungs/Pleura: Vague diffuse patchy ground-glass infiltrates consistent with known COVID pneumonia. Overall definite improvement since the prior CT scan. Upper Abdomen: No significant upper abdominal findings. Numerous gallstones are again noted in the gallbladder. Musculoskeletal: No acute bony findings. Remote healed right rib fractures are noted. IMPRESSION: 1. Fairly extensive mediastinal air dissecting up into the neck and all along the chest wall. 2. Small left basilar pneumothorax and probable small anterior pneumothoraces but no large pneumothorax. 3. Persistent diffuse patchy ground-glass infiltrates consistent with known COVID pneumonia. Overall definite improvement since the prior CT scan. 4. Stable three-vessel coronary artery calcifications. 5. Cholelithiasis. 6. Aortic atherosclerosis. Aortic Atherosclerosis (ICD10-I70.0). Electronically Signed   By: Rudie Meyer M.D.   On: 03/01/2020 14:30   CT ANGIO CHEST PE W OR WO CONTRAST  Result Date: 02/16/2020 CLINICAL DATA:  COVID positive peer EXAM: CT ANGIOGRAPHY CHEST WITH CONTRAST TECHNIQUE: Multidetector CT imaging of the chest was performed using the standard protocol during bolus administration of intravenous contrast. Multiplanar CT image reconstructions and MIPs were obtained  to evaluate the vascular anatomy. CONTRAST:  OMNIPAQUE IOHEXOL 350 MG/ML SOLN COMPARISON:  Oct 22, 2018 FINDINGS: Cardiovascular: There is moderate severity calcification of the thoracic aorta. Satisfactory opacification of the pulmonary arteries to the segmental level. No evidence of pulmonary embolism. Normal heart size. No pericardial effusion. Mediastinum/Nodes: No enlarged mediastinal, hilar, or axillary lymph nodes. Thyroid gland, trachea, and esophagus demonstrate no significant findings. Lungs/Pleura: Marked severity patchy and ground-glass appearing infiltrates are seen throughout both lungs. There is no evidence of a pleural effusion or pneumothorax. Upper Abdomen: Numerous subcentimeter gallstones are seen within the lumen of an otherwise normal-appearing gallbladder. Musculoskeletal: No chest wall abnormality. No acute or significant osseous findings. Review of the MIP images confirms the above findings. IMPRESSION: 1. Marked severity bilateral patchy and ground-glass appearing infiltrates. 2. Cholelithiasis. 3. Aortic atherosclerosis. Aortic Atherosclerosis (ICD10-I70.0). Electronically Signed   By: Aram Candela M.D.   On: 02/16/2020 22:13   CT Cervical Spine Wo Contrast  Result Date: 02/09/2020 CLINICAL DATA:  Trauma, fall on blood thinners EXAM: CT HEAD WITHOUT CONTRAST CT CERVICAL SPINE WITHOUT CONTRAST TECHNIQUE: Multidetector CT imaging of the head and cervical spine was performed following the standard protocol without intravenous contrast. Multiplanar CT image reconstructions of the cervical spine were also generated. COMPARISON:  None. FINDINGS: CT HEAD FINDINGS Brain: No acute territorial infarction, hemorrhage, or intracranial mass. Moderate atrophy. Mild hypodensity in the white matter consistent with chronic small vessel ischemic change. The ventricles are nonenlarged. Vascular: No hyperdense vessels.  Carotid vascular calcification. Skull: No depressed skull fracture.  Sinuses/Orbits: Mucosal thickening in the maxillary and ethmoid sinuses. Small fluid level in the left sphenoid sinus but no central skull base lucency is seen. Possible chronic left nasal bone fracture. Other: Mild right posterior scalp swelling. CT CERVICAL SPINE FINDINGS Alignment: No subluxation.  Facet alignment within normal limits. Skull base and vertebrae: No acute fracture. No primary bone lesion or focal pathologic process. Soft tissues and spinal canal: No prevertebral fluid or swelling. No visible canal hematoma. Disc levels: Multiple level degenerative change with moderate severe disease at C5-C6 and C6-C7. Facet degenerative changes at multiple levels with foraminal narrowing. Upper chest: Apical scarring Other: None IMPRESSION: 1. No CT evidence for acute intracranial abnormality. Atrophy and mild chronic small vessel ischemic changes of the white matter. 2. Degenerative changes of the cervical spine. No acute osseous abnormality. Electronically Signed   By: Jasmine Pang M.D.   On: 02/09/2020 01:13   DG Chest Port 1 View  Result Date: 03/03/2020 CLINICAL DATA:  COVID-19 pneumonia EXAM: PORTABLE CHEST 1 VIEW COMPARISON:  03/01/2020 FINDINGS: Was 624 hours. The cardiopericardial silhouette is within normal limits for size. Diffuse bilateral airspace disease is similar. Extensive pneumomediastinum and subcutaneous emphysema in the upper chest again noted. No substantial pleural effusion. The visualized bony structures of the thorax show no acute abnormality. Telemetry leads overlie the chest. IMPRESSION: No substantial change in exam. Diffuse bilateral airspace disease with extensive pneumomediastinum and subcutaneous emphysema in the upper chest. No pneumothorax. Electronically Signed   By: Kennith Center M.D.   On: 03/03/2020 07:42   DG Chest Port 1 View  Result Date: 03/01/2020 CLINICAL DATA:  Pneumonia due to COVID-19 EXAM: PORTABLE CHEST 1 VIEW COMPARISON:  Two days ago FINDINGS: Greater  chest wall emphysema. Pneumomediastinum. There is biapical extrapleural gas versus trace pneumothorax, unchanged. Bilateral pulmonary infiltrate. Normal heart size. IMPRESSION: 1. Progressive chest wall emphysema. 2. Small biapical pneumothorax versus extrapleural gas, interval stability favoring the latter. 3. Stable  infiltrates. Electronically Signed   By: Marnee Spring M.D.   On: 03/01/2020 07:13   DG CHEST PORT 1 VIEW  Result Date: 02/28/2020 CLINICAL DATA:  Respiratory failure, COVID-19 positive, chest tightness EXAM: PORTABLE CHEST 1 VIEW COMPARISON:  02/24/2020 FINDINGS: Stable cardiomediastinal contours. Atherosclerotic calcification of the aortic knob. Subtle airspace opacities bilaterally with a predominantly peripheral and basilar distribution, not appreciably changed from prior. No pleural effusion. No evidence of pneumothorax. Interval development of subcutaneous emphysema most pronounced at the base of the right neck and within the right axillary region. IMPRESSION: 1. Interval development of subcutaneous emphysema at the base of the right neck and within the right axillary region. No evidence of pneumothorax. Correlate with physical exam and for history of attempted central line placement. 2. Stable bilateral peripheral and basilar airspace opacities. Electronically Signed   By: Duanne Guess D.O.   On: 02/28/2020 13:07   DG Chest Port 1 View  Result Date: 02/15/2020 CLINICAL DATA:  COVID positive.  Hypoxia. EXAM: PORTABLE CHEST 1 VIEW COMPARISON:  Oct 22, 2018 FINDINGS: Diffuse bilateral interstitial and airspace opacities, new from prior. No visible pleural effusion. No pneumothorax. Cardiac silhouette is accentuated by low lung volumes and portable technique. Aortic atherosclerosis. IMPRESSION: Diffuse bilateral interstitial and airspace opacities, likely multifocal pneumonia given the clinical history. Electronically Signed   By: Feliberto Harts MD   On: 02/15/2020 10:56   DG  Chest Port 1V same Day  Result Date: 02/24/2020 CLINICAL DATA:  COVID EXAM: PORTABLE CHEST 1 VIEW COMPARISON:  02/15/2020 FINDINGS: Bilateral patchy airspace opacities are again noted, similar prior study. Slight increase in lung volumes. Heart is normal size. No effusions or pneumothorax. No acute bony abnormality. IMPRESSION: Patchy bilateral airspace disease, not significantly changed since prior study. Slight increased lung volumes. Electronically Signed   By: Charlett Nose M.D.   On: 02/24/2020 08:31   VAS [place] LOWER EXTREMITY VENOUS (DVT)  Result Date: 02/16/2020  Lower Venous DVTStudy Indications: Swelling, and elevated d dimer.  Risk Factors: + covid. Performing Technologist: Jannet Askew RCT RDMS  Examination Guidelines: A complete evaluation includes B-mode imaging, spectral Doppler, color Doppler, and power Doppler as needed of all accessible portions of each vessel. Bilateral testing is considered an integral part of a complete examination. Limited examinations for reoccurring indications may be performed as noted. The reflux portion of the exam is performed with the patient in reverse Trendelenburg.  +---------+---------------+---------+-----------+----------+--------------+ RIGHT    CompressibilityPhasicitySpontaneityPropertiesThrombus Aging +---------+---------------+---------+-----------+----------+--------------+ CFV      Full           Yes      Yes                                 +---------+---------------+---------+-----------+----------+--------------+ SFJ      Full                                                        +---------+---------------+---------+-----------+----------+--------------+ FV Prox  Full                                                        +---------+---------------+---------+-----------+----------+--------------+  FV Mid   Full                                                         +---------+---------------+---------+-----------+----------+--------------+ FV DistalFull                                                        +---------+---------------+---------+-----------+----------+--------------+ PFV      Full                                                        +---------+---------------+---------+-----------+----------+--------------+ POP      Full           Yes      Yes                                 +---------+---------------+---------+-----------+----------+--------------+ PTV      Full                                                        +---------+---------------+---------+-----------+----------+--------------+ PERO     Full                                                        +---------+---------------+---------+-----------+----------+--------------+   +---------+---------------+---------+-----------+----------+--------------+ LEFT     CompressibilityPhasicitySpontaneityPropertiesThrombus Aging +---------+---------------+---------+-----------+----------+--------------+ CFV      Full           Yes      Yes                                 +---------+---------------+---------+-----------+----------+--------------+ SFJ      Full                                                        +---------+---------------+---------+-----------+----------+--------------+ FV Prox  Full                                                        +---------+---------------+---------+-----------+----------+--------------+ FV Mid   Full                                                        +---------+---------------+---------+-----------+----------+--------------+  FV DistalFull                                                        +---------+---------------+---------+-----------+----------+--------------+ PFV      Full                                                         +---------+---------------+---------+-----------+----------+--------------+ POP      Full           Yes      Yes                                 +---------+---------------+---------+-----------+----------+--------------+ PTV      Full                                                        +---------+---------------+---------+-----------+----------+--------------+ PERO     Full                                                        +---------+---------------+---------+-----------+----------+--------------+     Summary: RIGHT: - There is no evidence of deep vein thrombosis in the lower extremity.  - No cystic structure found in the popliteal fossa.  LEFT: - There is no evidence of deep vein thrombosis in the lower extremity.  - No cystic structure found in the popliteal fossa.  *See table(s) above for measurements and observations. Electronically signed by Coral ElseVance Brabham MD on 02/16/2020 at 9:33:24 PM.    Final    ECHOCARDIOGRAM LIMITED  Result Date: 02/16/2020    ECHOCARDIOGRAM LIMITED REPORT   Patient Name:   Gordon Novak Date of Exam: 02/16/2020 Medical Rec #:  [id number]       Height:       72.0 in Accession #:    [id number]      Weight:       183.0 lb Date of Birth:  04/30/1935       BSA:          2.052 m Patient Age:    84 years        BP:           114/53 mmHg Patient Gender: M               HR:           77 bpm. Exam Location:  Inpatient Procedure: Limited Echo, Cardiac Doppler and Color Doppler Indications:    Elevated Troponin  History:        Patient has prior history of Echocardiogram examinations, most                 recent 02/17/2019. CHF and Cardiomyopathy, CAD, Arrythmias:Atrial  Fibrillation, Signs/Symptoms:Shortness of Breath and Fever; Risk                 Factors:Hypertension, Dyslipidemia, OBESITY and Diabetes.                 COVID+.  Sonographer:    Lavenia Atlas Referring Phys: [id number] Kasandra Knudsen PAHWANI IMPRESSIONS  1. Left ventricular ejection fraction,  by estimation, is 55 to 60%.  2. Right ventricular systolic function is normal. The right ventricular size is normal. There is normal pulmonary artery systolic pressure.  3. The interatrial septum is aneuyrsmal with frequent bowing from right to left.  4. The mitral valve is normal in structure. Trivial mitral valve regurgitation.  5. There is mild calcification of the aortic valve. There is mild thickening of the aortic valve. Mild aortic valve sclerosis is present, with no evidence of aortic valve stenosis. Comparison(s): Compared to prior echo on 01/2019, the LVEF appears mildly improved to 55-60%. FINDINGS  Left Ventricle: Septal motion is abnormal consistent with prior surgery. Left ventricular ejection fraction, by estimation, is 55 to 60%. The left ventricular internal cavity size was normal in size. There is no left ventricular hypertrophy. Right Ventricle: The right ventricular size is normal. Right ventricular systolic function is normal. There is normal pulmonary artery systolic pressure. The tricuspid regurgitant velocity is 2.41 m/s, and with an assumed right atrial pressure of 3 mmHg,  the estimated right ventricular systolic pressure is 26.2 mmHg. Left Atrium: Left atrial size was normal in size. Pericardium: There is no evidence of pericardial effusion. Mitral Valve: The mitral valve is normal in structure. There is mild thickening of the mitral valve leaflet(s). Mild mitral annular calcification. Trivial mitral valve regurgitation. Tricuspid Valve: The tricuspid valve is normal in structure. Tricuspid valve regurgitation is mild. Aortic Valve: There is mild calcification of the aortic valve. There is mild thickening of the aortic valve. Mild aortic valve sclerosis is present, with no evidence of aortic valve stenosis. Pulmonic Valve: The pulmonic valve was not assessed. Aorta: The aortic root is normal in size and structure. LEFT VENTRICLE PLAX 2D LVIDd:         3.80 cm  Diastology LVIDs:          2.70 cm  LV e' medial:    7.51 cm/s LV PW:         1.20 cm  LV E/e' medial:  7.7 LV IVS:        0.90 cm  LV e' lateral:   5.98 cm/s LVOT diam:     1.90 cm  LV E/e' lateral: 9.7 LVOT Area:     2.84 cm  RIGHT VENTRICLE RV S prime:     13.30 cm/s LEFT ATRIUM         Index LA diam:    2.80 cm 1.36 cm/m   AORTA Ao Root diam: 2.50 cm MITRAL VALVE               TRICUSPID VALVE MV Area (PHT): 5.38 cm    TR Peak grad:   23.2 mmHg MV Decel Time: 141 msec    TR Vmax:        241.00 cm/s MV E velocity: 57.80 cm/s MV A velocity: 70.30 cm/s  SHUNTS MV E/A ratio:  0.82        Systemic Diam: 1.90 cm Laurance Flatten MD Electronically signed by Laurance Flatten MD Signature Date/Time: 02/16/2020/4:23:30 PM    Final    ABORTED INVASIVE LAB PROCEDURE  Result Date:  02/15/2020 This case was aborted.  US Abdomen Limited RUQ  Result Date: 02/15/2020 CLINICAL DATA:  Elevated liver enzymes. EXAM: ULTRASOUND ABDOMEN LIMITED RIGHT UPPER QUADRANT COMPARISON:  CT abdomen 07/06/2019. FINDINGS: Gallbladder: Gallstones measuring up to 7 mm noted. Gallbladder wall thickness normal. No Murphy sign. Common bile duct: Diameter: Not visualized due to overlying bowel gas. Liver: Left no not visualized due to overlying bowel gas. No focal lesion identified. Within normal limits in parenchymal echogenicity. Portal vein is patent on color Doppler imaging with normal direction of blood flow towards the liver. Other: Limited study due to overlying bowel gas. IMPRESSION: 1.  Multiple small gallstones.  No gallbladder wall thickening. 2. Limited exam due to overlying bowel gas. Common bile duct not visualized. Portions of the liver not visualized. Electronically Signed   By: Maisie Fus  Register   On: 02/15/2020 15:35

## 2020-03-07 NOTE — TOC Progression Note (Addendum)
Transition of Care [facility]) - Progression Note    Patient Details  Name: Gordon Novak MRN: [id number] Date of Birth: 06-21-1934  [facility] CM/SW Contact  Mearl Latin, LCSW Phone Number: 03/07/2020, 11:20 AM  Clinical Narrative:    11:20am-CSW received request to contact patient's daughter. She had concerns that the MD was encouraging patient to go to SNF instead of home. CSW reiterated that the MD wants to make sure the patient will be safe since he is deconditioned and that the family can provide adequate support. She reported that the patient has a lot of family support and they feel he would do better at home. She declined PTAR and stated family would pick him up. CSW went over dc plans that have been set up for patient, including home health and DME (walker, 3in1). She asked if patient requires a hospital bed. CSW reached out to PT to assess for need and they will let CSW know. CSW arranged oxygen with [facility] Fayrene Fearing) and provided him with daughter's contact info to arrange delivery today. Daughter will need to bring the transport tank with her tomorrow when picking patient up at discharge.  1:53pm-Per PT, patient would benefit from a hospital bed and wheelchair. [facility] will deliver the bed with the oxygen to the home; they have spoken with patient's daughter. CSW inquired about a wheelchair for patient but daughter states they have a transport chair for him and do not need a wheelchair. They will be able to pick patient up tomorrow if medically stable between 4pm and 5pm after a medical appt in [place].    Expected Discharge Plan: Home w Home Health Services Barriers to Discharge: Continued Medical Work up  Expected Discharge Plan and Services Expected Discharge Plan: Home w Home Health Services   Discharge Planning Services: CM Consult Post Acute Care Choice: Home Health, Durable Medical Equipment Living arrangements for the past 2 months: Single Family Home                  DME Arranged: 3-N-1, Walker rolling, Oxygen DME Agency: [employer] Date DME Agency Contacted: 03/06/20 Time DME Agency Contacted: [phone] Representative spoke with at DME Agency: Beryl Meager Agency: [facility] Date [facility] Agency Contacted: 03/01/20 Time HH Agency Contacted: [phone] Representative spoke with at [facility] Agency: Confirmed patient choice for [employer]   Social Determinants of Health (SDOH) Interventions    Readmission Risk Interventions Readmission Risk Prevention Plan 03/06/2020  Transportation Screening Complete  PCP or Specialist Appt within 3-5 Days Complete  HRI or Home Care Consult Complete  Social Work Consult for Recovery Care Planning/Counseling Complete  Palliative Care Screening Not Applicable  Medication Review Oceanographer) Complete  Some recent data might be hidden

## 2020-03-07 NOTE — Progress Notes (Cosign Needed)
    Durable Medical Equipment  (From admission, onward)         Start     Ordered   03/07/20 1114  For home use only DME oxygen  Once       Question Answer Comment  Length of Need 6 Months   Mode or (Route) Nasal cannula   Liters per Minute 2   Frequency Continuous (stationary and portable oxygen unit needed)   Oxygen conserving device Yes   Oxygen delivery system Gas      03/07/20 1113   03/07/20 1113  For home use only DME Hospital bed  Once       Question Answer Comment  Length of Need 6 Months   Patient has (list medical condition): weakness, cvid   The above medical condition requires: Patient requires the ability to reposition frequently   Head must be elevated greater than: 30 degrees   Bed type Semi-electric   Support Surface: Gel Overlay      03/07/20 1113   03/02/20 1216  For home use only DME 3 n 1  Once        03/02/20 1215   03/02/20 1215  For home use only DME Walker rolling  Once       Question Answer Comment  Walker: With 5 Inch Wheels   Patient needs a walker to treat with the following condition Generalized weakness      03/02/20 1215

## 2020-03-07 NOTE — Progress Notes (Signed)
   03/07/20 1100  Clinical Encounter Type  Visited With  (Chaplain responded to code. Checked in with med staff)  Visit Type Code  Referral From Nurse  Consult/Referral To Chaplain  Chaplain responded to code. Chaplain checked in with Unit Secretary and Asst. Director at desk. No family present. This note was prepared by Deneen Harts, M.Div..  For questions please contact by phone [phone].

## 2020-03-07 NOTE — Progress Notes (Signed)
Occupational Therapy Treatment Patient Details Name: Gordon Novak MRN: [id number] DOB: [birth year] Today's Date: 03/07/2020    History of present illness Pt is an 85 y.o. male who tested (+) COVID-19 on 02/07/20, now admitted 02/15/20 with hypoxia; workup for acute hypoxemic respiratory failure due to COVID-19 PNA with severe lung parenchymal injury. C/o oral numbness 10/11; subsequent head CT negative for acute abnormality. PMH includes CAD, HTN, HLD, DM2, PE; pt fully vaccinated.   OT comments  Pt continues to present with high motivation to participate in therapy and progress towards established OT goals. Pt performing functional mobility in room (~10 ft) with Min Guard-Min A and RW. Pt fatigues quickly and with episodes of LOB requiring Min A for recovery. SpO2 dropping to 83% on 2L O2 during mobility and dropping to 85% on 4L O2 upon return to recliner. Pt demonstrating good purse lip breathing techniques. BP at beginning of session 102/62 and at end of session 100/56. Will continue to follow acutely as admitted and continue to recommend dc to home with HHOT.    Follow Up Recommendations  Home health OT;Supervision/Assistance - 24 hour    Equipment Recommendations  3 in 1 bedside commode    Recommendations for Other Services      Precautions / Restrictions Precautions Precautions: Fall;Other (comment) Precaution Comments: Quick to desaturate Restrictions Weight Bearing Restrictions: No       Mobility Bed Mobility               General bed mobility comments: In recliner upon arrival  Transfers Overall transfer level: Needs assistance Equipment used: Rolling walker (2 wheeled) Transfers: Sit to/from Stand Sit to Stand: Min guard         General transfer comment: Min Guard A for safety    Balance Overall balance assessment: Needs assistance Sitting-balance support: No upper extremity supported;Feet supported Sitting balance-Leahy Scale: Good     Standing  balance support: During functional activity;No upper extremity supported Standing balance-Leahy Scale: Poor Standing balance comment: Reliant on UE support for standing balance                           ADL either performed or assessed with clinical judgement   ADL Overall ADL's : Needs assistance/impaired     Grooming: Brushing hair;Set up;Sitting                   Toilet Transfer: Min guard;Ambulation;BSC;RW Toilet Transfer Details (indicate cue type and reason): Min Guard A for safety.          Functional mobility during ADLs: Min guard;Rolling walker;Minimal assistance General ADL Comments: Pt performing functional mobility from recliner to sink (~10 feet). Pt with moments when he required Min A for balance. SpO2 dropping to low 80s     Vision       Perception     Praxis      Cognition Arousal/Alertness: Awake/alert Behavior During Therapy: WFL for tasks assessed/performed Overall Cognitive Status: Within Functional Limits for tasks assessed                                 General Comments: WFL for simple tasks, following all commands. Having to repeat at times due to [facility]        Exercises Other Exercises Other Exercises: 1) sit<>stand (unable to tolerate >30-sec standing due to orthostatic), 2) sitting with arm pumps, 3) marching in  place ~20-sec, 4) walking 4' forwards/backwards + march in place, 5) walk 12' forwards/backwards, 6) 3x sit<>stand, 7) transfer to [facility] for BM -- prolonged seated rest breaks between activity bouts to recover fatigue/DOE/hypoxia/hypotension   Shoulder Instructions       General Comments SpO2 97% on 2L at rest. BP 102/62. SpO2 dropping to 83% on 2L during mobility; and again when returning to recliner on 4L.     Pertinent Vitals/ Pain       Pain Assessment: No/denies pain  Home Living                                          Prior Functioning/Environment               Frequency  Min 2X/week        Progress Toward Goals  OT Goals(current goals can now be found in the care plan section)  Progress towards OT goals: Progressing toward goals  Acute Rehab OT Goals Patient Stated Goal: Home with family in next 1-2 days OT Goal Formulation: With patient Time For Goal Achievement: 03/08/20 Potential to Achieve Goals: Good ADL Goals Pt Will Perform Lower Body Bathing: with modified independence;sitting/lateral leans;sit to/from stand Pt Will Perform Lower Body Dressing: with modified independence;sitting/lateral leans;sit to/from stand Pt Will Transfer to Toilet: with modified independence;ambulating;regular height toilet Pt Will Perform Tub/Shower Transfer: Shower transfer;shower seat;ambulating;with modified independence Pt/caregiver will Perform Home Exercise Program: Increased strength;Both right and left upper extremity;With written HEP provided Additional ADL Goal #1: Pt will recall and apply 3-5 energy conservation strategies to apply to ADL routine Additional ADL Goal #2: Pt will self monitor and maintain SpO2 >88% during ADL mobility  Plan Discharge plan remains appropriate    Co-evaluation                 AM-PAC OT "6 Clicks" Daily Activity     Outcome Measure   Help from another person eating meals?: A Little Help from another person taking care of personal grooming?: A Little Help from another person toileting, which includes using toliet, bedpan, or urinal?: A Little Help from another person bathing (including washing, rinsing, drying)?: A Little Help from another person to put on and taking off regular upper body clothing?: A Little Help from another person to put on and taking off regular lower body clothing?: A Little 6 Click Score: 18    End of Session Equipment Utilized During Treatment: Rolling walker;Oxygen (2-4L O2)  OT Visit Diagnosis: Unsteadiness on feet (R26.81);Other abnormalities of gait and mobility  (R26.89);Muscle weakness (generalized) (M62.81)   Activity Tolerance Patient tolerated treatment well   Patient Left in chair;with call bell/phone within reach;Other (comment) (2L O2)   Nurse Communication Mobility status        Time: 7473-4037 OT Time Calculation (min): 23 min  Charges: OT General Charges $OT Visit: 1 Visit OT Treatments $Therapeutic Activity: 23-37 mins  Rayan Ines MSOT, OTR/L Acute Rehab Pager: [phone] Office: [phone]   Theodoro Grist Cornelia Walraven 03/07/2020, 4:42 PM

## 2020-03-07 NOTE — Progress Notes (Addendum)
Physical Therapy Treatment Patient Details Name: Gordon Novak MRN: [id number] DOB: Aug 22, 1934 Today's Date: 03/07/2020    History of Present Illness Pt is an 85 y.o. male who tested (+) COVID-19 on 02/07/20, now admitted 02/15/20 with hypoxia; workup for acute hypoxemic respiratory failure due to COVID-19 PNA with severe lung parenchymal injury. C/o oral numbness 10/11; subsequent head CT negative for acute abnormality. PMH includes CAD, HTN, HLD, DM2, PE; pt fully vaccinated.   PT Comments    Pt slowly progressing with mobility. Standing activity limited by (+) orthostatic hypotension, SOB, hypoxia, and fatigue; pt quick to desaturate to 77% on 6L O2 HFNC immediately upon standing. Despite this, pt motivated to participate, and moving fairly well with close min guard for multiple short bouts of standing therex; still with generalized weakness and significant decreased activity tolerance. Family declining SNF; plans to have pt return home with increased assist, also have a transport chair since w/c will not fit in home (see below for additional DME recommendations). Pt at high risk for readmission. Will continue to follow acutely.  Orthostatic BPs Sitting 106/51  Standing ~30-sec (unable to tolerate longer due to symptomatic) 81/45  Return to sit with arm pumps 101/59  Standing and marching 83/39  Return to sit 110/39  Post-short ambulation distance 96/45  After transfer to/from [facility] 95/44      Follow Up Recommendations  Home health PT;Supervision for mobility/OOB (pt/family declined SNF)     Equipment Recommendations  Rolling walker with 5" wheels;3in1 (PT);Hospital bed (w/c will not fit in house)    Recommendations for Other Services       Precautions / Restrictions Precautions Precautions: Fall;Other (comment) Precaution Comments: Quick to desaturate; symptomatic orthostatic hypotension Restrictions Weight Bearing Restrictions: No    Mobility  Bed Mobility                General bed mobility comments: In recliner upon arrival  Transfers Overall transfer level: Needs assistance Equipment used: Rolling walker (2 wheeled) Transfers: Sit to/from Stand Sit to Stand: Min guard         General transfer comment: Multiple sit<>stands throughout session from recliner and BSC to RW with min guard for safety/lines, heavy reliance on UE support; cues for improved eccentric control which pt able to correct  Ambulation/Gait Ambulation/Gait assistance: Min guard Gait Distance (Feet): 16 Feet Assistive device: Rolling walker (2 wheeled) Gait Pattern/deviations: Step-through pattern;Decreased stride length;Trunk flexed Gait velocity: Decreased   General Gait Details: Bouts of walking forwards/backwards, staying close to chair secondary to (+) orthostatic hypotension; pt able to walk 4' + 12' with RW and close min guard for safety, prolonged seated rest between these and standing bouts with marching in place   Stairs             Wheelchair Mobility    Modified Rankin (Stroke Patients Only)       Balance Overall balance assessment: Needs assistance Sitting-balance support: No upper extremity supported;Feet supported Sitting balance-Leahy Scale: Good       Standing balance-Leahy Scale: Poor Standing balance comment: Reliant on UE support for standing balance                            Cognition Arousal/Alertness: Awake/alert Behavior During Therapy: WFL for tasks assessed/performed Overall Cognitive Status: Within Functional Limits for tasks assessed  General Comments: WFL for simple tasks, following all commands. Having to repeat at times due to [facility]      Exercises Other Exercises Other Exercises: 1) sit<>stand (unable to tolerate >30-sec standing due to orthostatic), 2) sitting with arm pumps, 3) marching in place ~20-sec, 4) walking 4' forwards/backwards + march in place, 5) walk  12' forwards/backwards, 6) 3x sit<>stand, 7) transfer to [facility] for BM -- prolonged seated rest breaks between activity bouts to recover fatigue/DOE/hypoxia/hypotension    General Comments General comments (skin integrity, edema, etc.): SpO2 92% on 5L at rest, very quick to desaturate to 77% on 6L O2 with standing. Symptomatic (+) orthostatic hypotension with lowest BP down to 83/39 standing      Pertinent Vitals/Pain Pain Assessment: No/denies pain    Home Living                      Prior Function            PT Goals (current goals can now be found in the care plan section) Acute Rehab PT Goals Patient Stated Goal: Home with family in next 1-2 days PT Goal Formulation: With patient Time For Goal Achievement: 03/21/20 Potential to Achieve Goals: Fair Progress towards PT goals: Progressing toward goals (slowly)    Frequency    Min 3X/week      PT Plan Equipment recommendations need to be updated    Co-evaluation              AM-PAC PT "6 Clicks" Mobility   Outcome Measure  Help needed turning from your back to your side while in a flat bed without using bedrails?: A Little Help needed moving from lying on your back to sitting on the side of a flat bed without using bedrails?: A Little Help needed moving to and from a bed to a chair (including a wheelchair)?: A Little Help needed standing up from a chair using your arms (e.g., wheelchair or bedside chair)?: A Little Help needed to walk in hospital room?: A Little Help needed climbing 3-5 steps with a railing? : A Lot 6 Click Score: 17    End of Session Equipment Utilized During Treatment: Oxygen Activity Tolerance: Patient tolerated treatment well;Patient limited by fatigue;Treatment limited secondary to medical complications (Comment) Patient left: in chair;with call bell/phone within reach Nurse Communication: Mobility status PT Visit Diagnosis: Other abnormalities of gait and mobility (R26.89)      Time: 3664-4034 PT Time Calculation (min) (ACUTE ONLY): 45 min  Charges:  $Therapeutic Exercise: 23-37 mins $Therapeutic Activity: 8-22 mins                    Ina Homes, PT, DPT Acute Rehabilitation Services  Pager [phone] Office [phone]  Malachy Chamber 03/07/2020, 2:36 PM

## 2020-03-07 NOTE — Progress Notes (Signed)
SATURATION QUALIFICATIONS: (This note is used to comply with regulatory documentation for home oxygen)  Patient Saturations on Room Air at Rest = 92%  Patient Saturations on Room Air while Standing = 77%  Patient Saturations on 6 Liters of oxygen while Standing = 91%  Please briefly explain why patient needs home oxygen: This patient needs at home oxygen because he is not able to maintain an adequate oxygen saturation without oxygen.

## 2020-03-07 NOTE — Progress Notes (Signed)
Pt transfer from 5W. Pt alert and oriented x4.O2 at 2LPM/[place]. Denies SOB. Denies c/o pain. Oriented to room and call bell.

## 2020-03-08 DIAGNOSIS — J9601 Acute respiratory failure with hypoxia: Secondary | ICD-10-CM | POA: Diagnosis not present

## 2020-03-08 DIAGNOSIS — U071 COVID-19: Secondary | ICD-10-CM | POA: Diagnosis not present

## 2020-03-08 LAB — BASIC METABOLIC PANEL
Anion gap: 7 (ref 5–15)
BUN: 21 mg/dL (ref 8–23)
CO2: 25 mmol/L (ref 22–32)
Calcium: 7.7 mg/dL — ABNORMAL LOW (ref 8.9–10.3)
Chloride: 101 mmol/L (ref 98–111)
Creatinine, Ser: 0.84 mg/dL (ref 0.61–1.24)
GFR, Estimated: >60 mL/min (ref >60)
Glucose, Bld: 196 mg/dL — ABNORMAL HIGH (ref 70–99)
Potassium: 4.1 mmol/L (ref 3.5–5.1)
Sodium: 133 mmol/L — ABNORMAL LOW (ref 135–145)

## 2020-03-08 LAB — CBC WITH DIFFERENTIAL/PLATELET
Abs Immature Granulocytes: 0.06 10*3/uL (ref 0.00–0.07)
Basophils Absolute: 0 10*3/uL (ref 0.0–0.1)
Basophils Relative: 0 %
Eosinophils Absolute: 0 10*3/uL (ref 0.0–0.5)
Eosinophils Relative: 0 %
HCT: 30 % — ABNORMAL LOW (ref 39.0–52.0)
Hemoglobin: 10.3 g/dL — ABNORMAL LOW (ref 13.0–17.0)
Immature Granulocytes: 1 %
Lymphocytes Relative: 12 %
Lymphs Abs: 0.8 10*3/uL (ref 0.7–4.0)
MCH: 31.3 pg (ref 26.0–34.0)
MCHC: 34.3 g/dL (ref 30.0–36.0)
MCV: 91.2 fL (ref 80.0–100.0)
Monocytes Absolute: 0.2 10*3/uL (ref 0.1–1.0)
Monocytes Relative: 3 %
Neutro Abs: 5.4 10*3/uL (ref 1.7–7.7)
Neutrophils Relative %: 84 %
Platelets: 110 10*3/uL — ABNORMAL LOW (ref 150–400)
RBC: 3.29 MIL/uL — ABNORMAL LOW (ref 4.22–5.81)
RDW: 13.7 % (ref 11.5–15.5)
WBC: 6.4 10*3/uL (ref 4.0–10.5)
nRBC: 0 % (ref 0.0–0.2)

## 2020-03-08 LAB — D-DIMER, QUANTITATIVE: D-Dimer, Quant: 0.82 ug/mL-FEU — ABNORMAL HIGH (ref 0.00–0.50)

## 2020-03-08 LAB — GLUCOSE, CAPILLARY
Glucose-Capillary: 175 mg/dL — ABNORMAL HIGH (ref 70–99)
Glucose-Capillary: 151 mg/dL — ABNORMAL HIGH (ref 70–99)

## 2020-03-08 LAB — BRAIN NATRIURETIC PEPTIDE: B Natriuretic Peptide: 39.5 pg/mL (ref 0.0–100.0)

## 2020-03-08 LAB — MAGNESIUM: Magnesium: 2 mg/dL (ref 1.7–2.4)

## 2020-03-08 MED ORDER — FUROSEMIDE 40 MG PO TABS
40.0000 mg | ORAL_TABLET | Freq: Every day | ORAL | Status: DC
  Administered 2020-03-08: 40 mg via ORAL
  Filled 2020-03-08: qty 1

## 2020-03-08 MED ORDER — ALBUTEROL SULFATE HFA 108 (90 BASE) MCG/ACT IN AERS
2.0000 | INHALATION_SPRAY | Freq: Four times a day (QID) | RESPIRATORY_TRACT | 0 refills | Status: AC | PRN
Start: 2020-03-08 — End: ?

## 2020-03-08 NOTE — Progress Notes (Signed)
Discharge paperwork explained and given to patient.  Patient verbalizes understanding with no questions.

## 2020-03-08 NOTE — Discharge Summary (Signed)
[name]:[id number] DOB: 1934-10-01 DOA: 02/15/2020  PCP: [name], MD  Admit date: 02/15/2020  Discharge date: 03/08/2020  Admitted From: Home  Disposition:  Home   Recommendations for Outpatient Follow-up:   Follow up with PCP in 1-2 weeks  PCP Please obtain BMP/CBC, 2 view CXR in 1week,  (see Discharge instructions)   PCP Please follow up on the following pending results: Kindly review CT report as below, patient has asymptomatic gallstones which need to be monitored.  Check CBC, CMP, 2 view chest x-ray in 7 to 10 days.   Home Health: PT,RN,SW   Equipment/Devices: o2, bed, 3 in 1  Consultations: None  Discharge Condition: Fair   CODE STATUS: DNR   Diet Recommendation: Heart Healthy - Low Carb  Diet Order            Diet heart healthy/carb modified Room service appropriate? No; Fluid consistency: Thin  Diet effective now                  CC - SOB   Brief history of present illness from the day of admission and additional interim summary    Patient is a 85 y.o. male with PMHx of CAD s/p PCI 2020, DM-2, HLD, HTN, history of PE no longer on anticoagulation-presented with shortness of breath-found to have severe hypoxemia requiring high flow oxygen-due to COVID-19 pneumonia.  COVID-19 vaccinated status: Vaccinated  Significant Events: 9/21>> Admit to [facility] for hypoxia due to COVID-19 pneumonia  Significant studies: 9/21>>Chest x-ray: Diffuse lung infiltrates 9/22>> CTA chest: No PE-severe bilateral patchy/groundglass appearing infiltrates, cholelithiasis 9/22>> bilateral lower extremity Doppler: No DVT 9/22>> Echo: EF 55-60%, RV systolic function normal. 9/30>> chest x-ray: Patchy bilateral airspace disease-not significantly changed from prior.  COVID-19 medications: Steroids:  9/21>> Remdesivir: 9/21>> 9/25 Baricitinib: 9/21>>  Antibiotics: Rocephin: 9/22>> 9/26 Zithromax: 9/22>> 9/26  Microbiology data: 9/21 >>blood culture: No growth  Procedures: None  Consults: Cardiology  DVT prophylaxis: enoxaparin (LOVENOX) injection 40 mg at twice daily dosing (intermediate dosing) SCDs Start: 02/15/20 1300                                                                 Hospital Course   Acute Hypoxic Resp Failure due to Covid 19 Viral pneumonia - Patient was fully vaccinated with the Pfizer vaccine.  He was treated with combination of IV steroids, Remdesivir and Baricitinib, he had incurred severe parenchymal lung injury, now much improved on 2lit o2.  Although he is quite frail he has refused going to SNF, I have expressed my concerns with patient's daughter [name] but family also wants to take him home.  At this point he will be discharged home with home health along with PCP follow-up on home oxygen and rescue inhaler.   SpO2: 92 %  O2 Flow Rate (L/min): 2 L/min FiO2 (%): 50 %  Recent Labs  Lab 03/03/20 0407 03/04/20 0433 03/04/20 1440 03/05/20 0656 03/06/20 0553 03/07/20 0442 03/08/20 0408  WBC  --  8.6  --  8.3 6.8 6.5 6.4  DDIMER  --   --   --  0.83* 0.69* 0.68* 0.82*  BNP  --   --  44.5 45.8 42.0 41.4 39.5  AST 36  --   --   --   --   --   --   ALT 67*  --   --   --   --   --   --   ALKPHOS 74  --   --   --   --   --   --   BILITOT 0.8  --   --   --   --   --   --   ALBUMIN 2.1*  --   --   --   --   --   --          Some vague Oral numbness ongoing for several weeks according to the patient.  Nonacute head CT with old CVA, symptoms seem to have resolved.  Pneumomediastinum -  Noted on CT scan.  Seems to be stable. Repeat Chest x-ray stable.  Elevated troponins - Minimally elevated troponins with flat trend. Not consistent with ACS. Echo with preserved EF. Cardiology evaluation completed. No recommendations apart from supportive  care.    Minimally elevated D-dimer - Has a history of PE in 2020 and completed 1 year treatment with Xarelto in May 2021.  CT angiogram chest and Doppler studies negative for VTE. With prophylactic anticoagulation D-dimer has improved.  Transaminitis Improved.  Likely secondary to COVID-19. Recheck in the outpatient setting.  Chronic Diastolic heart failure -  Echocardiogram done during this hospitalization shows normal systolic function.  Strict ins and outs and daily weights.    Daily home dose beta-blocker and Lasix upon discharge, PCP to monitor.    History of CAD - Stable.  Seen by cardiology initially due to elevated troponin.  Currently without any chest pain.  Continue medical management.  On aspirin beta-blocker and statin.    Gout - Continue allopurinol  BPH - Continue Flomax  Chronic left lower extremity swelling - At baseline. Lower extremity Dopplers were negative.  Compression stockings provided here and now much improved.   Cholelithiasis - Incidentally noted on ultrasound of the abdomen.  Asymptomatic.  DM-2 (A1c 7.1 on 9/21) Diet controlled.  PCP to monitor.   Discharge diagnosis     Principal Problem:   Acute hypoxemic respiratory failure due to COVID-19 [facility]) Active Problems:   Type 2 diabetes mellitus (HCC)   Elevated liver enzymes   Essential hypertension    Discharge instructions    Discharge Instructions    Discharge instructions   Complete by: As directed    Follow with Primary MD [name], MD in 7 days   Get CBC, CMP, 2 view Chest X ray -  checked next visit within 1 week by Primary MD    Activity: As tolerated with Full fall precautions use walker/cane & assistance as needed  Disposition Home   Diet: Heart Healthy  Low Carb  Special Instructions: If you have smoked or chewed Tobacco  in the last 2 yrs please stop smoking, stop any regular Alcohol  and or any Recreational drug use.  On your next visit with your primary care  physician please Get  Medicines reviewed and adjusted.  Please request your Prim.MD to go over all Hospital Tests and Procedure/Radiological results at the follow up, please get all Hospital records sent to your Prim MD by signing hospital release before you go home.  If you experience worsening of your admission symptoms, develop shortness of breath, life threatening emergency, suicidal or homicidal thoughts you must seek medical attention immediately by calling 911 or calling your MD immediately  if symptoms less severe.  You Must read complete instructions/literature along with all the possible adverse reactions/side effects for all the Medicines you take and that have been prescribed to you. Take any new Medicines after you have completely understood and accpet all the possible adverse reactions/side effects.   Do not drive, operate heavy machinery, perform activities at heights, swimming or participation in water activities or provide baby sitting services if your were admitted for syncope or siezures until you have seen by Primary MD or a Neurologist and advised to do so again.   Increase activity slowly   Complete by: As directed       Discharge Medications   Allergies as of 03/08/2020      Reactions   Lipitor [atorvastatin Calcium] Other (See Comments)   Patient becomes agitated, has memory issues.      Medication List    STOP taking these medications   azithromycin 250 MG tablet Commonly known as: ZITHROMAX   predniSONE 5 MG (21) Tbpk tablet Commonly known as: STERAPRED UNI-PAK 21 TAB     TAKE these medications   albuterol 108 (90 Base) MCG/ACT inhaler Commonly known as: VENTOLIN HFA Inhale 2 puffs into the lungs every 6 (six) hours as needed for wheezing or shortness of breath.   allopurinol 300 MG tablet Commonly known as: ZYLOPRIM Take 300 mg by mouth daily.   beta carotene w/minerals tablet Take 1 tablet by mouth daily.   bisacodyl 5 MG EC tablet Commonly  known as: DULCOLAX Take 1 tablet (5 mg total) by mouth daily.   clopidogrel 75 MG tablet Commonly known as: PLAVIX TAKE 1 TABLET BY MOUTH EVERY DAY   docusate sodium 100 MG capsule Commonly known as: COLACE Take 100 mg by mouth 2 (two) times daily. Take daily at bedtime   furosemide 20 MG tablet Commonly known as: LASIX TAKE 1 TABLET BY MOUTH DAILY. MAY TAKE AN ADDITIONAL 20 MG TABLET IF WEIGHT GAIN IS GREATER THAN 3 LBS AS NEEDED DAILY What changed: See the new instructions.   metFORMIN 500 MG 24 hr tablet Commonly known as: GLUCOPHAGE-XR Take 500 mg by mouth daily with breakfast.   metoprolol succinate 25 MG 24 hr tablet Commonly known as: TOPROL-XL TAKE 1/2 TABLET (12.5 MG TOTAL) BY MOUTH AT BEDTIME. What changed:   how much to take  how to take this  when to take this  additional instructions   multivitamin with minerals Tabs tablet Take 1 tablet by mouth every evening.   nitroGLYCERIN 0.4 MG SL tablet Commonly known as: NITROSTAT Place 1 tablet (0.4 mg total) under the tongue every 5 (five) minutes x 3 doses as needed for chest pain.   potassium chloride SA 20 MEQ tablet Commonly known as: KLOR-CON Take 20 mEq by mouth daily.   prednisoLONE acetate 1 % ophthalmic suspension Commonly known as: PRED FORTE Place 1 drop into both eyes at bedtime.   Proctofoam HC rectal foam Generic drug: hydrocortisone-pramoxine Place 1 applicator rectally 3 (three) times daily as needed for hemorrhoids or itching.   rosuvastatin 10  MG tablet Commonly known as: CRESTOR Take 1 tablet (10 mg total) by mouth daily.   tamsulosin 0.4 MG Caps capsule Commonly known as: FLOMAX Take 0.4 mg by mouth at bedtime.   valACYclovir 500 MG tablet Commonly known as: VALTREX Take 500 mg by mouth 2 (two) times daily as needed (cold sores).            Durable Medical Equipment  (From admission, onward)         Start     Ordered   03/07/20 1114  For home use only DME oxygen   Once       Question Answer Comment  Length of Need 6 Months   Mode or (Route) Nasal cannula   Liters per Minute 2   Frequency Continuous (stationary and portable oxygen unit needed)   Oxygen conserving device Yes   Oxygen delivery system Gas      03/07/20 1113   03/07/20 1113  For home use only DME Hospital bed  Once       Question Answer Comment  Length of Need 6 Months   Patient has (list medical condition): weakness, cvid   The above medical condition requires: Patient requires the ability to reposition frequently   Head must be elevated greater than: 30 degrees   Bed type Semi-electric   Support Surface: Gel Overlay      03/07/20 1113   03/02/20 1216  For home use only DME 3 n 1  Once        03/02/20 1215   03/02/20 1215  For home use only DME Walker rolling  Once       Question Answer Comment  Walker: With 5 Inch Wheels   Patient needs a walker to treat with the following condition Generalized weakness      03/02/20 1215           Follow-up Information    [facility] Follow up.   Why: The home health agency will contact you for the first home visit Contact information: [phone]       [facility] Follow up.   Why: Rolling walker and 3 in 1 commode to be delivered to patienrt's room. Contact information: [address] [place] [address] [phone]        [facility].   Why: Oxygen and hospital bed to be delivered to your home prior to discharge.  Contact information: [address] [place] [address] [phone]        Barbie BannerWilson, Fred H, MD. Schedule an appointment as soon as possible for a visit in 1 week(s).   Specialty: Family Medicine Contact information: [address] [place] [address] [phone]        Marykay LexHarding, David W, MD .   Specialty: Cardiology Contact information: [address] [place] [address] [phone]                Major procedures and Radiology Reports - PLEASE review detailed and final reports thoroughly  -       CT HEAD WO CONTRAST  Result Date: 03/06/2020 CLINICAL DATA:  Dizziness, nonspecific. Additional provided: History of dizziness, patient reports headache today. EXAM: CT HEAD WITHOUT CONTRAST TECHNIQUE: Contiguous axial images were obtained from the base of the skull through the vertex without intravenous contrast. COMPARISON:  Prior head CT 02/09/2020. FINDINGS: Brain: Mild generalized cerebral atrophy. Redemonstrated small chronic cortically based infarct within the left middle frontal gyrus (series  3, images 18 and 19). Mild ill-defined hypoattenuation within the cerebral white matter which is nonspecific, but consistent with chronic small vessel ischemic disease. There is no acute intracranial hemorrhage. No acute demarcated cortical infarct. No extra-axial fluid collection. No evidence of intracranial mass. No midline shift. Vascular: No hyperdense vessel.  Atherosclerotic calcifications. Skull: Normal. Negative for fracture or focal lesion. Sinuses/Orbits: Visualized orbits show no acute finding. Scattered fluid within bilateral ethmoid air cells. Left sphenoid sinus air-fluid level. No significant mastoid effusion. IMPRESSION: No CT evidence of acute intracranial abnormality. Redemonstrated small chronic left frontal lobe cortical infarct. Background mild generalized cerebral atrophy and chronic small vessel ischemic disease, stable as compared to the head CT of 02/09/2020. Ethmoid and left sphenoid sinusitis. Electronically Signed   By: Jackey Loge DO   On: 03/06/2020 15:01   CT Head Wo Contrast  Result Date: 02/09/2020 CLINICAL DATA:  Trauma, fall on blood thinners EXAM: CT HEAD WITHOUT CONTRAST CT CERVICAL SPINE WITHOUT CONTRAST TECHNIQUE: Multidetector CT imaging of the head and cervical spine was performed following the standard protocol without intravenous contrast. Multiplanar CT image  reconstructions of the cervical spine were also generated. COMPARISON:  None. FINDINGS: CT HEAD FINDINGS Brain: No acute territorial infarction, hemorrhage, or intracranial mass. Moderate atrophy. Mild hypodensity in the white matter consistent with chronic small vessel ischemic change. The ventricles are nonenlarged. Vascular: No hyperdense vessels.  Carotid vascular calcification. Skull: No depressed skull fracture. Sinuses/Orbits: Mucosal thickening in the maxillary and ethmoid sinuses. Small fluid level in the left sphenoid sinus but no central skull base lucency is seen. Possible chronic left nasal bone fracture. Other: Mild right posterior scalp swelling. CT CERVICAL SPINE FINDINGS Alignment: No subluxation.  Facet alignment within normal limits. Skull base and vertebrae: No acute fracture. No primary bone lesion or focal pathologic process. Soft tissues and spinal canal: No prevertebral fluid or swelling. No visible canal hematoma. Disc levels: Multiple level degenerative change with moderate severe disease at C5-C6 and C6-C7. Facet degenerative changes at multiple levels with foraminal narrowing. Upper chest: Apical scarring Other: None IMPRESSION: 1. No CT evidence for acute intracranial abnormality. Atrophy and mild chronic small vessel ischemic changes of the white matter. 2. Degenerative changes of the cervical spine. No acute osseous abnormality. Electronically Signed   By: Jasmine Pang M.D.   On: 02/09/2020 01:13   CT CHEST W CONTRAST  Result Date: 03/01/2020 CLINICAL DATA:  COVID pneumonia. EXAM: CT CHEST WITH CONTRAST TECHNIQUE: Multidetector CT imaging of the chest was performed during intravenous contrast administration. CONTRAST:  65mL OMNIPAQUE IOHEXOL 300 MG/ML  SOLN COMPARISON:  Chest CT 02/16/2020 FINDINGS: Cardiovascular: The heart is normal in size. No pericardial effusion. The aorta is normal in caliber. No dissection or focal aneurysm. Stable moderate atherosclerotic calcifications.  The branch vessels are patent. Stable three-vessel coronary artery calcifications. The pulmonary arteries are fairly well opacified. No filling defects to suggest pulmonary embolism. Mediastinum/Nodes: Fairly extensive mediastinal air. This is dissecting up into the neck and all along the chest wall. Mild pneumopericardium also noted. Gas is dissecting into the intercostal muscles. There is a small left basilar pneumothorax and probable small anterior pneumothoraces but no large pneumothorax requiring chest tube placement. The esophagus is grossly normal. Lungs/Pleura: Vague diffuse patchy ground-glass infiltrates consistent with known COVID pneumonia. Overall definite improvement since the prior CT scan. Upper Abdomen: No significant upper abdominal findings. Numerous gallstones are again noted in the gallbladder. Musculoskeletal: No acute bony findings. Remote healed right rib fractures are noted. IMPRESSION:  1. Fairly extensive mediastinal air dissecting up into the neck and all along the chest wall. 2. Small left basilar pneumothorax and probable small anterior pneumothoraces but no large pneumothorax. 3. Persistent diffuse patchy ground-glass infiltrates consistent with known COVID pneumonia. Overall definite improvement since the prior CT scan. 4. Stable three-vessel coronary artery calcifications. 5. Cholelithiasis. 6. Aortic atherosclerosis. Aortic Atherosclerosis (ICD10-I70.0). Electronically Signed   By: Rudie Meyer M.D.   On: 03/01/2020 14:30   CT ANGIO CHEST PE W OR WO CONTRAST  Result Date: 02/16/2020 CLINICAL DATA:  COVID positive peer EXAM: CT ANGIOGRAPHY CHEST WITH CONTRAST TECHNIQUE: Multidetector CT imaging of the chest was performed using the standard protocol during bolus administration of intravenous contrast. Multiplanar CT image reconstructions and MIPs were obtained to evaluate the vascular anatomy. CONTRAST:  OMNIPAQUE IOHEXOL 350 MG/ML SOLN COMPARISON:  Oct 22, 2018 FINDINGS:  Cardiovascular: There is moderate severity calcification of the thoracic aorta. Satisfactory opacification of the pulmonary arteries to the segmental level. No evidence of pulmonary embolism. Normal heart size. No pericardial effusion. Mediastinum/Nodes: No enlarged mediastinal, hilar, or axillary lymph nodes. Thyroid gland, trachea, and esophagus demonstrate no significant findings. Lungs/Pleura: Marked severity patchy and ground-glass appearing infiltrates are seen throughout both lungs. There is no evidence of a pleural effusion or pneumothorax. Upper Abdomen: Numerous subcentimeter gallstones are seen within the lumen of an otherwise normal-appearing gallbladder. Musculoskeletal: No chest wall abnormality. No acute or significant osseous findings. Review of the MIP images confirms the above findings. IMPRESSION: 1. Marked severity bilateral patchy and ground-glass appearing infiltrates. 2. Cholelithiasis. 3. Aortic atherosclerosis. Aortic Atherosclerosis (ICD10-I70.0). Electronically Signed   By: Aram Candela M.D.   On: 02/16/2020 22:13   CT Cervical Spine Wo Contrast  Result Date: 02/09/2020 CLINICAL DATA:  Trauma, fall on blood thinners EXAM: CT HEAD WITHOUT CONTRAST CT CERVICAL SPINE WITHOUT CONTRAST TECHNIQUE: Multidetector CT imaging of the head and cervical spine was performed following the standard protocol without intravenous contrast. Multiplanar CT image reconstructions of the cervical spine were also generated. COMPARISON:  None. FINDINGS: CT HEAD FINDINGS Brain: No acute territorial infarction, hemorrhage, or intracranial mass. Moderate atrophy. Mild hypodensity in the white matter consistent with chronic small vessel ischemic change. The ventricles are nonenlarged. Vascular: No hyperdense vessels.  Carotid vascular calcification. Skull: No depressed skull fracture. Sinuses/Orbits: Mucosal thickening in the maxillary and ethmoid sinuses. Small fluid level in the left sphenoid sinus but no  central skull base lucency is seen. Possible chronic left nasal bone fracture. Other: Mild right posterior scalp swelling. CT CERVICAL SPINE FINDINGS Alignment: No subluxation.  Facet alignment within normal limits. Skull base and vertebrae: No acute fracture. No primary bone lesion or focal pathologic process. Soft tissues and spinal canal: No prevertebral fluid or swelling. No visible canal hematoma. Disc levels: Multiple level degenerative change with moderate severe disease at C5-C6 and C6-C7. Facet degenerative changes at multiple levels with foraminal narrowing. Upper chest: Apical scarring Other: None IMPRESSION: 1. No CT evidence for acute intracranial abnormality. Atrophy and mild chronic small vessel ischemic changes of the white matter. 2. Degenerative changes of the cervical spine. No acute osseous abnormality. Electronically Signed   By: Jasmine Pang M.D.   On: 02/09/2020 01:13   DG Chest Port 1 View  Result Date: 03/03/2020 CLINICAL DATA:  COVID-19 pneumonia EXAM: PORTABLE CHEST 1 VIEW COMPARISON:  03/01/2020 FINDINGS: Was 624 hours. The cardiopericardial silhouette is within normal limits for size. Diffuse bilateral airspace disease is similar. Extensive pneumomediastinum and subcutaneous emphysema in  the upper chest again noted. No substantial pleural effusion. The visualized bony structures of the thorax show no acute abnormality. Telemetry leads overlie the chest. IMPRESSION: No substantial change in exam. Diffuse bilateral airspace disease with extensive pneumomediastinum and subcutaneous emphysema in the upper chest. No pneumothorax. Electronically Signed   By: Kennith Center M.D.   On: 03/03/2020 07:42   DG Chest Port 1 View  Result Date: 03/01/2020 CLINICAL DATA:  Pneumonia due to COVID-19 EXAM: PORTABLE CHEST 1 VIEW COMPARISON:  Two days ago FINDINGS: Greater chest wall emphysema. Pneumomediastinum. There is biapical extrapleural gas versus trace pneumothorax, unchanged. Bilateral  pulmonary infiltrate. Normal heart size. IMPRESSION: 1. Progressive chest wall emphysema. 2. Small biapical pneumothorax versus extrapleural gas, interval stability favoring the latter. 3. Stable infiltrates. Electronically Signed   By: Marnee Spring M.D.   On: 03/01/2020 07:13   DG CHEST PORT 1 VIEW  Result Date: 02/28/2020 CLINICAL DATA:  Respiratory failure, COVID-19 positive, chest tightness EXAM: PORTABLE CHEST 1 VIEW COMPARISON:  02/24/2020 FINDINGS: Stable cardiomediastinal contours. Atherosclerotic calcification of the aortic knob. Subtle airspace opacities bilaterally with a predominantly peripheral and basilar distribution, not appreciably changed from prior. No pleural effusion. No evidence of pneumothorax. Interval development of subcutaneous emphysema most pronounced at the base of the right neck and within the right axillary region. IMPRESSION: 1. Interval development of subcutaneous emphysema at the base of the right neck and within the right axillary region. No evidence of pneumothorax. Correlate with physical exam and for history of attempted central line placement. 2. Stable bilateral peripheral and basilar airspace opacities. Electronically Signed   By: Duanne Guess D.O.   On: 02/28/2020 13:07   DG Chest Port 1 View  Result Date: 02/15/2020 CLINICAL DATA:  COVID positive.  Hypoxia. EXAM: PORTABLE CHEST 1 VIEW COMPARISON:  Oct 22, 2018 FINDINGS: Diffuse bilateral interstitial and airspace opacities, new from prior. No visible pleural effusion. No pneumothorax. Cardiac silhouette is accentuated by low lung volumes and portable technique. Aortic atherosclerosis. IMPRESSION: Diffuse bilateral interstitial and airspace opacities, likely multifocal pneumonia given the clinical history. Electronically Signed   By: Feliberto Harts MD   On: 02/15/2020 10:56   DG Chest Port 1V same Day  Result Date: 02/24/2020 CLINICAL DATA:  COVID EXAM: PORTABLE CHEST 1 VIEW COMPARISON:  02/15/2020  FINDINGS: Bilateral patchy airspace opacities are again noted, similar prior study. Slight increase in lung volumes. Heart is normal size. No effusions or pneumothorax. No acute bony abnormality. IMPRESSION: Patchy bilateral airspace disease, not significantly changed since prior study. Slight increased lung volumes. Electronically Signed   By: Charlett Nose M.D.   On: 02/24/2020 08:31   VAS [place] LOWER EXTREMITY VENOUS (DVT)  Result Date: 02/16/2020  Lower Venous DVTStudy Indications: Swelling, and elevated d dimer.  Risk Factors: + covid. Performing Technologist: Jannet Askew RCT RDMS  Examination Guidelines: A complete evaluation includes B-mode imaging, spectral Doppler, color Doppler, and power Doppler as needed of all accessible portions of each vessel. Bilateral testing is considered an integral part of a complete examination. Limited examinations for reoccurring indications may be performed as noted. The reflux portion of the exam is performed with the patient in reverse Trendelenburg.  +---------+---------------+---------+-----------+----------+--------------+  RIGHT     Compressibility Phasicity Spontaneity Properties Thrombus Aging  +---------+---------------+---------+-----------+----------+--------------+  CFV       Full            Yes       Yes                                    +---------+---------------+---------+-----------+----------+--------------+  SFJ       Full                                                             +---------+---------------+---------+-----------+----------+--------------+  FV Prox   Full                                                             +---------+---------------+---------+-----------+----------+--------------+  FV Mid    Full                                                             +---------+---------------+---------+-----------+----------+--------------+  FV Distal Full                                                              +---------+---------------+---------+-----------+----------+--------------+  PFV       Full                                                             +---------+---------------+---------+-----------+----------+--------------+  POP       Full            Yes       Yes                                    +---------+---------------+---------+-----------+----------+--------------+  PTV       Full                                                             +---------+---------------+---------+-----------+----------+--------------+  PERO      Full                                                             +---------+---------------+---------+-----------+----------+--------------+   +---------+---------------+---------+-----------+----------+--------------+  LEFT      Compressibility Phasicity Spontaneity Properties Thrombus Aging  +---------+---------------+---------+-----------+----------+--------------+  CFV       Full            Yes       Yes                                    +---------+---------------+---------+-----------+----------+--------------+  SFJ       Full                                                             +---------+---------------+---------+-----------+----------+--------------+  FV Prox   Full                                                             +---------+---------------+---------+-----------+----------+--------------+  FV Mid    Full                                                             +---------+---------------+---------+-----------+----------+--------------+  FV Distal Full                                                             +---------+---------------+---------+-----------+----------+--------------+  PFV       Full                                                             +---------+---------------+---------+-----------+----------+--------------+  POP       Full            Yes       Yes                                     +---------+---------------+---------+-----------+----------+--------------+  PTV       Full                                                             +---------+---------------+---------+-----------+----------+--------------+  PERO      Full                                                             +---------+---------------+---------+-----------+----------+--------------+     Summary: RIGHT: - There is no evidence of deep vein thrombosis in the lower extremity.  - No cystic structure found in the popliteal fossa.  LEFT: - There is no evidence of deep vein thrombosis in the lower extremity.  - No cystic structure found in the popliteal fossa.  *See table(s)  above for measurements and observations. Electronically signed by Coral Else MD on 02/16/2020 at 9:33:24 PM.    Final    ECHOCARDIOGRAM LIMITED  Result Date: 02/16/2020    ECHOCARDIOGRAM LIMITED REPORT   Patient Name:   BRANT PEETS Date of Exam: 02/16/2020 Medical Rec #:  [id number]       Height:       72.0 in Accession #:    [id number]      Weight:       183.0 lb Date of Birth:  08/21/1934       BSA:          2.052 m Patient Age:    84 years        BP:           114/53 mmHg Patient Gender: M               HR:           77 bpm. Exam Location:  Inpatient Procedure: Limited Echo, Cardiac Doppler and Color Doppler Indications:    Elevated Troponin  History:        Patient has prior history of Echocardiogram examinations, most                 recent 02/17/2019. CHF and Cardiomyopathy, CAD, Arrythmias:Atrial                 Fibrillation, Signs/Symptoms:Shortness of Breath and Fever; Risk                 Factors:Hypertension, Dyslipidemia, OBESITY and Diabetes.                 COVID+.  Sonographer:    Lavenia Atlas Referring Phys: [id number] Kasandra Knudsen PAHWANI IMPRESSIONS  1. Left ventricular ejection fraction, by estimation, is 55 to 60%.  2. Right ventricular systolic function is normal. The right ventricular size is normal. There is normal pulmonary artery  systolic pressure.  3. The interatrial septum is aneuyrsmal with frequent bowing from right to left.  4. The mitral valve is normal in structure. Trivial mitral valve regurgitation.  5. There is mild calcification of the aortic valve. There is mild thickening of the aortic valve. Mild aortic valve sclerosis is present, with no evidence of aortic valve stenosis. Comparison(s): Compared to prior echo on 01/2019, the LVEF appears mildly improved to 55-60%. FINDINGS  Left Ventricle: Septal motion is abnormal consistent with prior surgery. Left ventricular ejection fraction, by estimation, is 55 to 60%. The left ventricular internal cavity size was normal in size. There is no left ventricular hypertrophy. Right Ventricle: The right ventricular size is normal. Right ventricular systolic function is normal. There is normal pulmonary artery systolic pressure. The tricuspid regurgitant velocity is 2.41 m/s, and with an assumed right atrial pressure of 3 mmHg,  the estimated right ventricular systolic pressure is 26.2 mmHg. Left Atrium: Left atrial size was normal in size. Pericardium: There is no evidence of pericardial effusion. Mitral Valve: The mitral valve is normal in structure. There is mild thickening of the mitral valve leaflet(s). Mild mitral annular calcification. Trivial mitral valve regurgitation. Tricuspid Valve: The tricuspid valve is normal in structure. Tricuspid valve regurgitation is mild. Aortic Valve: There is mild calcification of the aortic valve. There is mild thickening of the aortic valve. Mild aortic valve sclerosis is present, with no evidence of aortic valve stenosis. Pulmonic Valve: The pulmonic valve was not assessed. Aorta: The aortic root is normal in size and structure. LEFT  VENTRICLE PLAX 2D LVIDd:         3.80 cm  Diastology LVIDs:         2.70 cm  LV e' medial:    7.51 cm/s LV PW:         1.20 cm  LV E/e' medial:  7.7 LV IVS:        0.90 cm  LV e' lateral:   5.98 cm/s LVOT diam:     1.90  cm  LV E/e' lateral: 9.7 LVOT Area:     2.84 cm  RIGHT VENTRICLE RV S prime:     13.30 cm/s LEFT ATRIUM         Index LA diam:    2.80 cm 1.36 cm/m   AORTA Ao Root diam: 2.50 cm MITRAL VALVE               TRICUSPID VALVE MV Area (PHT): 5.38 cm    TR Peak grad:   23.2 mmHg MV Decel Time: 141 msec    TR Vmax:        241.00 cm/s MV E velocity: 57.80 cm/s MV A velocity: 70.30 cm/s  SHUNTS MV E/A ratio:  0.82        Systemic Diam: 1.90 cm Laurance Flatten MD Electronically signed by Laurance Flatten MD Signature Date/Time: 02/16/2020/4:23:30 PM    Final    ABORTED INVASIVE LAB PROCEDURE  Result Date: 02/15/2020 This case was aborted.  US Abdomen Limited RUQ  Result Date: 02/15/2020 CLINICAL DATA:  Elevated liver enzymes. EXAM: ULTRASOUND ABDOMEN LIMITED RIGHT UPPER QUADRANT COMPARISON:  CT abdomen 07/06/2019. FINDINGS: Gallbladder: Gallstones measuring up to 7 mm noted. Gallbladder wall thickness normal. No Murphy sign. Common bile duct: Diameter: Not visualized due to overlying bowel gas. Liver: Left no not visualized due to overlying bowel gas. No focal lesion identified. Within normal limits in parenchymal echogenicity. Portal vein is patent on color Doppler imaging with normal direction of blood flow towards the liver. Other: Limited study due to overlying bowel gas. IMPRESSION: 1.  Multiple small gallstones.  No gallbladder wall thickening. 2. Limited exam due to overlying bowel gas. Common bile duct not visualized. Portions of the liver not visualized. Electronically Signed   By: Maisie Fus  Register   On: 02/15/2020 15:35    Micro Results     No results found for this or any previous visit (from the past 240 hour(s)).  Today   Subjective    Gordon Novak today has no headache,no chest abdominal pain,no new weakness tingling or numbness, feels much better wants to go home today.     Objective   Blood pressure (!) 110/57, pulse 90, temperature 98.1 F (36.7 C), temperature source Oral,  resp. rate 18, height 6' (1.829 m), weight 75.3 kg, SpO2 92 %.   Intake/Output Summary (Last 24 hours) at 03/08/2020 0914 Last data filed at 03/08/2020 0849 Gross per 24 hour  Intake 240 ml  Output 425 ml  Net -185 ml    Exam  Awake Alert, No new F.N deficits, Normal affect [place].AT,PERRAL Supple Neck,No JVD, No cervical lymphadenopathy appriciated.  Symmetrical Chest wall movement, Good air movement bilaterally, CTAB RRR,No Gallops,Rubs or new Murmurs, No Parasternal Heave +ve B.Sounds, Abd Soft, Non tender, No organomegaly appriciated, No rebound -guarding or rigidity. No Cyanosis, Clubbing or edema, No new Rash or bruise   Data Review   CBC w Diff:  Lab Results  Component Value Date   WBC 6.4 03/08/2020   HGB 10.3 (L) 03/08/2020  HGB 14.4 10/27/2018   HCT 30.0 (L) 03/08/2020   HCT 39.9 10/27/2018   PLT 110 (L) 03/08/2020   PLT 280 10/27/2018   LYMPHOPCT 12 03/08/2020   MONOPCT 3 03/08/2020   EOSPCT 0 03/08/2020   BASOPCT 0 03/08/2020    CMP:  Lab Results  Component Value Date   NA 133 (L) 03/08/2020   NA 142 08/26/2019   K 4.1 03/08/2020   CL 101 03/08/2020   CO2 25 03/08/2020   BUN 21 03/08/2020   BUN 12 08/26/2019   CREATININE 0.84 03/08/2020   PROT 4.5 (L) 03/03/2020   PROT 5.8 (L) 08/26/2019   ALBUMIN 2.1 (L) 03/03/2020   ALBUMIN 3.9 08/26/2019   BILITOT 0.8 03/03/2020   BILITOT 0.5 08/26/2019   ALKPHOS 74 03/03/2020   AST 36 03/03/2020   ALT 67 (H) 03/03/2020  .   Total Time in preparing paper work, data evaluation and todays exam - 35 minutes  Susa Raring M.D on 03/08/2020 at 9:14 AM  [facility]   Office  [phone]

## 2020-03-08 NOTE — Care Management Important Message (Signed)
Important Message  Patient Details  Name: Gordon Novak MRN: [id number] Date of Birth: 10-Aug-1934   Medicare Important Message Given:  Yes     [name] 03/08/2020, 3:21 PM

## 2020-03-08 NOTE — Discharge Instructions (Signed)
Follow with Primary MD [name], MD in 7 days   Get CBC, CMP, 2 view Chest X ray -  checked next visit within 1 week by Primary MD    Activity: As tolerated with Full fall precautions use walker/cane & assistance as needed  Disposition Home   Diet: Heart Healthy  Low Carb  Special Instructions: If you have smoked or chewed Tobacco  in the last 2 yrs please stop smoking, stop any regular Alcohol  and or any Recreational drug use.  On your next visit with your primary care physician please Get Medicines reviewed and adjusted.  Please request your Prim.MD to go over all Hospital Tests and Procedure/Radiological results at the follow up, please get all Hospital records sent to your Prim MD by signing hospital release before you go home.  If you experience worsening of your admission symptoms, develop shortness of breath, life threatening emergency, suicidal or homicidal thoughts you must seek medical attention immediately by calling 911 or calling your MD immediately  if symptoms less severe.  You Must read complete instructions/literature along with all the possible adverse reactions/side effects for all the Medicines you take and that have been prescribed to you. Take any new Medicines after you have completely understood and accpet all the possible adverse reactions/side effects.   Do not drive, operate heavy machinery, perform activities at heights, swimming or participation in water activities or provide baby sitting services if your were admitted for syncope or siezures until you have seen by Primary MD or a Neurologist and advised to do so again.

## 2020-03-13 ENCOUNTER — Telehealth: Payer: Self-pay | Admitting: Cardiology

## 2020-03-13 NOTE — Telephone Encounter (Signed)
Pt c/o BP issue: STAT if pt c/o blurred vision, one-sided weakness or slurred speech  1. What are your last 5 BP readings? 90/44  2. Are you having any other symptoms (ex. Dizziness, headache, blurred vision, passed out)? No  3. What is your BP issue? Low BP   Patient's daughter states the patient was in the hospital for covid 9/21 and got home Wednesday. She states he is still very week and can hardly stand up. She states he also has a bruise on his back and is on blood thinners. She states it is on his entire back and a home health nurse looked at it, but was not very concerned. She states she has pictures of the buise, but it does not hurt him.

## 2020-03-13 NOTE — Telephone Encounter (Signed)
Spoke to patient's daughter [name].She stated father was recently in hospital for 21 days with Covid.Stated he came home last Wednesday.Stated she is concerned he has a large dark purple bruise across lower back.B/P has been low in 90's.Today 94/44.Pulse in low 100's.Stated he is taking plavix 75 mg daily and metoprolol 25 mg 1/2 tablet at bedtime.I will send message to Dr.Harding for advice.

## 2020-03-13 NOTE — Telephone Encounter (Signed)
I wonder if the bruise is probably related to being in bed in the hospital for a long time.  Not unreasonable to hold Plavix for 7 days. Also, with his current blood pressure, I suppose we need to hold the metoprolol.  He does make sure that he is adequately hydrated which also includes eating well.  Would hold the metoprolol also for a week.  Bryan Lemma, MD .

## 2020-03-14 NOTE — Telephone Encounter (Signed)
Spoke to patient's daughter [name] Dr.Harding's advice given.She will have father hold Plavix and Metoprolol for 7 days then restart.Stated he is drinking fluids and eating well.She will call back if he does not improve.

## 2020-03-31 ENCOUNTER — Ambulatory Visit (INDEPENDENT_AMBULATORY_CARE_PROVIDER_SITE_OTHER): Payer: Medicare Other | Admitting: Cardiology

## 2020-03-31 ENCOUNTER — Other Ambulatory Visit: Payer: Self-pay

## 2020-03-31 ENCOUNTER — Encounter: Payer: Self-pay | Admitting: Cardiology

## 2020-03-31 VITALS — BP 92/42 | HR 103 | Ht 72.0 in | Wt 155.4 lb

## 2020-03-31 DIAGNOSIS — R000 Tachycardia, unspecified: Secondary | ICD-10-CM

## 2020-03-31 DIAGNOSIS — I255 Ischemic cardiomyopathy: Secondary | ICD-10-CM

## 2020-03-31 DIAGNOSIS — I5042 Chronic combined systolic (congestive) and diastolic (congestive) heart failure: Secondary | ICD-10-CM | POA: Diagnosis not present

## 2020-03-31 DIAGNOSIS — I952 Hypotension due to drugs: Secondary | ICD-10-CM

## 2020-03-31 DIAGNOSIS — I25110 Atherosclerotic heart disease of native coronary artery with unstable angina pectoris: Secondary | ICD-10-CM

## 2020-03-31 DIAGNOSIS — Z955 Presence of coronary angioplasty implant and graft: Secondary | ICD-10-CM

## 2020-03-31 DIAGNOSIS — E785 Hyperlipidemia, unspecified: Secondary | ICD-10-CM

## 2020-03-31 DIAGNOSIS — I259 Chronic ischemic heart disease, unspecified: Secondary | ICD-10-CM

## 2020-03-31 DIAGNOSIS — I25119 Atherosclerotic heart disease of native coronary artery with unspecified angina pectoris: Secondary | ICD-10-CM

## 2020-03-31 DIAGNOSIS — Z86711 Personal history of pulmonary embolism: Secondary | ICD-10-CM

## 2020-03-31 MED ORDER — MIDODRINE HCL 5 MG PO TABS: ORAL_TABLET | ORAL | 4 refills | Status: AC

## 2020-03-31 NOTE — Patient Instructions (Addendum)
Medication Instructions:   Continue  To Hold taking Metoprolol   start  taking  Midodrine 5 mg  Twice a day - breakfast and afternoon   For the first month  Daily will give you new parameters at next visit   *If you need a refill on your cardiac medications before your next appointment, please call your pharmacy*   Lab Work:  Not needed   Testing/Procedures: Not needed   Follow-Up: At [facility], you and your health needs are our priority.  As part of our continuing mission to provide you with exceptional heart care, we have created designated Provider Care Teams.  These Care Teams include your primary Cardiologist (physician) and Advanced Practice Providers (APPs -  Physician Assistants and Nurse Practitioners) who all work together to provide you with the care you need, when you need it.     Your next appointment:   1 to 2  month(s)  Jun 01, 2020 at 9:00 am   The format for your next appointment:   Virtual Visit   Provider:   Dr Herbie Baltimore   Other Instructions   increase calorie intake -- Eat anything you like , get up moving - use walker  Or have someone with you  So you want fall. KEEP MOVING.

## 2020-03-31 NOTE — Progress Notes (Signed)
Primary Care Provider: [name], MD Cardiologist: Bryan Lemma, MD Electrophysiologist: None  Clinic Note: Chief Complaint  Patient presents with  . Hospitalization Follow-up    Prolonged hospitalization for COVID-19, significant hypoxia.  Marland Kitchen Hypotension    Was told to hold Toprol  . Coronary Artery Disease    No angina, minimal troponin elevation in setting.    . Cardiomyopathy    Ischemic CM-resolved.  Most recent Echo 55 to 60%.    HPI:    Gordon Novak is a 85 y.o. male with a PMH notable for CAD (h/o Inferolateral STEMI, RCA PCI May 2020), and SUBSEGMENTAL PE who presents today for essentially 19-month follow-up.   May 24-26 2020: Acute inferior-inferolateral STEMI (RCA -> DES PCI to RCA.  Oct 22, 2018: Subsegmental pulmonary emboli; curbside by cardiology suggested converting from Brilinta to Plavix and start Xarelto.  Completed Xarelto x1 year in May 2021.  Problem List Items Addressed This Visit    Ischemic cardiomyopathy - Primary (Chronic) - Chronic combined systolic and diastolic heart failure (HCC) (Chronic)   Hypotension due to drugs   Relevant Medications   midodrine (PROAMATINE) 5 MG tablet   Coronary artery disease involving native coronary artery of native heart with unstable angina pectoris (HCC)       Other Relevant Orders   EKG 12-Lead (Completed)     Gordon Novak was last seen on August 30, 2019 -> he was doing relatively well overall recently.  Not as energetic as he used to be, but was still doing yard work and gardening as well as other chores.  He tires out quicker, but denies any angina or heart failure.  Still has memory issues but not really any significant improvement having stopped statin. -> He stopped taking Xarelto on May 31, continue Plavix.  Recent Hospitalizations:   02/15/2020: Acute hypoxic respite failure with Covid-Covid pneumonia; noted to have elevated troponins recommend supportive care.  On initial presentation oxygen  saturations were 68% on room air.  EKG showed inferior ST elevations.  Code STEMI was called but canceled.  Echo essentially normal, troponin levels minimally elevated.  Cardiology consult on 02/16/2020 for abnormal EKG in the setting of hypoxia.  Oxygen levels 160s upon EMS arrival.  Patient never had any chest pain.  Minimal troponin elevation. => With normal echo, no additional cardiac evaluation required.  Reviewed  CV studies:    The following studies were reviewed today: (if available, images/films reviewed: From Epic Chart or Care Everywhere) . TTE 02/16/2020: EF 55 to 60%.  No R WMA.  IAS bowing.  Normal PA pressures.  Aortic valve calcification/sclerosis but no stenosis.  Interval History:   Gordon Novak returns today doing fairly well from cardiac standpoint.  He indicated that prior to his Covid infection, he mowed the yard that past weekend.  He is quite deconditioned now still has some drops in his oxygen saturation at home.  He has been about 3 weeks in the hospital and rehab.  Is currently doing physical therapy 2 days a week. Throughout the entire illness, he never really had any chest pain or pressure.  He clearly had dyspnea, but no worsening PND orthopnea or edema.  When the biggest issues is that he has lost a lot of weight and strength.  He did not feel eating much in the hospital, but is now finding it harder to increase his appetite.  He also has had lots of problems with lowish blood pressures associated with orthostatic  symptoms.  Makes it difficult for him to exercise.  Thankfully, he has not any falls.  As far as swelling goes, relatively stable.  He is taking his Lasix just about every day but no additional doses.  He has not actually taken his Toprol-we can stop that prior to his arrival today.  CV Review of Symptoms (Summary): positive for - dyspnea on exertion, shortness of breath and This dyspnea is more related to large numbers going down recovering from Covid  pneumonia.  Hypotension with orthostatic dizziness negative for - chest pain, irregular heartbeat, orthopnea, palpitations, paroxysmal nocturnal dyspnea, rapid heart rate or Edema (well controlled), despite having lightheadedness dizziness, no syncope or near syncope.  No TIA or amaurosis fugax.  No claudication.  The patient currently does not have symptoms concerning for COVID-19 infection (fever, chills, cough, or new shortness of breath).  He is recovering from recent bout of COVID-19.  REVIEWED OF SYSTEMS   Review of Systems  Constitutional: Positive for malaise/fatigue (Having a hard time getting his energy back) and weight loss (Significant weight loss since his last visit-most related to his hospitalization).  HENT: Negative for congestion and nosebleeds.   Respiratory: Positive for cough (Off-and-on, nonproductive) and shortness of breath.   Cardiovascular: Negative for leg swelling.  Gastrointestinal: Negative for abdominal pain and blood in stool.       Not eating as much.  Poor appetite  Genitourinary: Negative for hematuria.  Musculoskeletal: Positive for back pain and joint pain.  Neurological: Positive for dizziness and weakness.  Psychiatric/Behavioral: Negative for depression and memory loss. The patient is not nervous/anxious and does not have insomnia.    I have reviewed and (if needed) personally updated the patient's problem list, medications, allergies, past medical and surgical history, social and family history.   PAST MEDICAL HISTORY   Past Medical History:  Diagnosis Date  . Bladder outlet obstruction   . Blindness of left eye   . CAD S/P percutaneous coronary angioplasty 10/22/2018   Cath-PCA 10/18/2018: 100% mRCA -- (DES PCI-Synergy 3 mm x 28 mm - 3.3 mm).  Otherwise normal coronaries  . Chronic Left leg swelling    a. Since 2007.  Marland Kitchen Essential hypertension   . Gout   . Hemorrhoids 05/12/2014  . Ischemic cardiomyopathy--EF now resolved to 50 to 55%.  10/30/2018   Post inferior STEMI: TTE 10/18/2018: EF 35-40%, diffuse hypokinesis.  GR 1 DD --> resolved 4 months post MI. -Residual inferior and inferolateral and inferoseptal hypokinesis.  . Nephrolithiasis   . PE (pulmonary thromboembolism) (HCC) 10/22/2018  . ST elevation myocardial infarction (STEMI) of inferior wall (HCC) 10/18/2018    PCI/DES x1 to the mRCA  . Type 2 diabetes mellitus (HCC)     PAST SURGICAL HISTORY   Past Surgical History:  Procedure Laterality Date  . CATARACT EXTRACTION    . CORONARY/GRAFT ACUTE MI REVASCULARIZATION N/A 10/18/2018   Procedure: Coronary/Graft Acute MI Revascularization - Cornonary Stent Intervention;  Surgeon: Runell Gess, MD;  Location: [facility];;  100% mRCA -- (DES PCI-Synergy 3 mm x 28 mm - 3.3 mm).    . CYSTOSCOPY W/ URETERAL STENT PLACEMENT Left 05/07/2014   Procedure: CYSTOSCOPY WITH RETROGRADE PYELOGRAM/ LEFT URETERAL STENT PLACEMENT;  Surgeon: Crist Fat, MD;  Location: [facility];  Service: Urology;  Laterality: Left;  . CYSTOSCOPY WITH RETROGRADE PYELOGRAM, URETEROSCOPY AND STENT PLACEMENT Left 05/14/2014   Procedure: LEFT URETEROSCOPY AND STENT EXCHANGE,STONE BASKETRY EXTRACTION;  Surgeon: Jerilee Field, MD;  Location: [facility];  Service: Urology;  Laterality: Left;  . HOLMIUM LASER APPLICATION Left 05/14/2014   Procedure: HOLMIUM LASER APPLICATION;  Surgeon: Jerilee Field, MD;  Location: [facility];  Service: Urology;  Laterality: Left;  . INTRAOCULAR LENS INSERTION Right   . LEFT HEART CATH AND CORONARY ANGIOGRAPHY N/A 10/18/2018   Procedure: LEFT HEART CATH AND CORONARY ANGIOGRAPHY;  Surgeon: Runell Gess, MD;  Location: [facility];;  100% mRCA -- (DES PCI)..  Otherwise normal coronaries  . TRANSTHORACIC ECHOCARDIOGRAM  02/17/2019   EF 50 to 55%.  Residual basal-mid inferolateral and apical lateral as well as basal septal hypokinesis.  Normal RV size and function.  Relatively normal atrial size.  Moderate  aortic sclerosis/calcification but no stenosis.  Bowed interatrial septum suggesting elevated RAP.  Mildly elevated PAP.--Significant improvement -> prior EF (initially 35 to 40%) in setting of Inf-lat STEMI May 2020  . TRANSTHORACIC ECHOCARDIOGRAM  02/16/2020    EF 55 to 60%.  No R WMA.  IAS bowing.  Normal PA pressures.  Aortic valve calcification/sclerosis but no stenosis.   10/18/2018 post PCI imaging:    There is no immunization history on file for this patient. --> He has had 2 vaccine injections, -> convinced by his Nishan infection but he does not want to get it again.  MEDICATIONS/ALLERGIES   Current Meds  Medication Sig  . albuterol (VENTOLIN HFA) 108 (90 Base) MCG/ACT inhaler Inhale 2 puffs into the lungs every 6 (six) hours as needed for wheezing or shortness of breath.  . allopurinol (ZYLOPRIM) 300 MG tablet Take 300 mg by mouth daily.   . beta carotene w/minerals (OCUVITE) tablet Take 1 tablet by mouth daily.   . bisacodyl (DULCOLAX) 5 MG EC tablet Take 1 tablet (5 mg total) by mouth daily.  . clopidogrel (PLAVIX) 75 MG tablet TAKE 1 TABLET BY MOUTH EVERY DAY  . docusate sodium (COLACE) 100 MG capsule Take 100 mg by mouth 2 (two) times daily. Take daily at bedtime  . furosemide (LASIX) 20 MG tablet TAKE 1 TABLET BY MOUTH DAILY. MAY TAKE AN ADDITIONAL 20 MG TABLET IF WEIGHT GAIN IS GREATER THAN 3 LBS AS NEEDED DAILY  . Multiple Vitamin (MULTIVITAMIN WITH MINERALS) TABS tablet Take 1 tablet by mouth every evening.   . nitroGLYCERIN (NITROSTAT) 0.4 MG SL tablet Place 1 tablet (0.4 mg total) under the tongue every 5 (five) minutes x 3 doses as needed for chest pain.  . OXYGEN Please add moisture to patient's oxygen.  . potassium chloride SA (K-DUR) 20 MEQ tablet Take 20 mEq by mouth daily.   . prednisoLONE acetate (PRED FORTE) 1 % ophthalmic suspension Place 1 drop into both eyes at bedtime.   Marland Kitchen PROCTOFOAM HC rectal foam Place 1 applicator rectally 3 (three) times daily as needed  for hemorrhoids or itching.   . rosuvastatin (CRESTOR) 10 MG tablet Take 1 tablet (10 mg total) by mouth daily.  . tamsulosin (FLOMAX) 0.4 MG CAPS capsule Take 0.4 mg by mouth at bedtime.   . valACYclovir (VALTREX) 500 MG tablet Take 500 mg by mouth 2 (two) times daily as needed (cold sores).    Allergies  Allergen Reactions  . Lipitor [Atorvastatin Calcium] Other (See Comments)    Patient becomes agitated, has memory issues.    SOCIAL HISTORY/FAMILY HISTORY   Reviewed in Epic:  Pertinent findings: Still very active.  Currently doing 2 days a week physical therapy.  Continues a slow and steady recovery following his hospitalization.  OBJCTIVE -PE, EKG, labs  Wt Readings from Last 3 Encounters:  03/31/20 155 lb 6.4 oz (70.5 kg)  03/01/20 166 lb 0.1 oz (75.3 kg)  02/09/20 185 lb (83.9 kg)    Physical Exam: BP (!) 92/42   Pulse (!) 103   Ht 6' (1.829 m)   Wt 155 lb 6.4 oz (70.5 kg)   SpO2 95%   BMI 21.08 kg/m  Physical Exam Constitutional:      General: He is not in acute distress.    Appearance: Normal appearance. He is ill-appearing (Chronically ill). He is not toxic-appearing.     Comments: Somewhat thin, frail elderly gentleman.  Definitely weaker seeming than last visit, not unexpected after hospitalization.  HENT:     Head: Normocephalic and atraumatic.     Comments: L eye blind with reduced extraocular motor function   Neck:     Vascular: No carotid bruit, hepatojugular reflux or JVD.  Cardiovascular:     Rate and Rhythm: Regular rhythm. Tachycardia present.  No extrasystoles are present.    Chest Wall: PMI is not displaced.     Pulses: Normal pulses.     Heart sounds: S1 normal and S2 normal. Murmur (1-2/6 C-D SEM RUSB.) heard.  No friction rub. No gallop.   Pulmonary:     Effort: Pulmonary effort is normal. No respiratory distress.     Breath sounds: Normal breath sounds.  Chest:     Chest wall: No tenderness.  Abdominal:     General: Abdomen is flat.  Bowel sounds are normal.     Palpations: Abdomen is soft. There is no mass.     Comments: No HSM or bruit  Musculoskeletal:        General: Swelling (Puffy swelling 1+ bilateral) present.     Cervical back: Normal range of motion and neck supple.  Skin:    General: Skin is warm and dry.     Coloration: Skin is pale.  Neurological:     General: No focal deficit present.     Mental Status: He is alert and oriented to person, place, and time. Mental status is at baseline.     Comments: Extraocular muscles decreased left eye.  Psychiatric:        Mood and Affect: Mood normal.        Behavior: Behavior normal.        Thought Content: Thought content normal.        Judgment: Judgment normal.     Comments: A little bit more slow to answer questions.  Little more fatigued.     Adult ECG Report N/A   Recent Labs: Reviewed Lab Results  Component Value Date   CHOL 76 02/15/2020   HDL 39 (L) 02/15/2020   LDLCALC 17 02/15/2020   TRIG 101 02/15/2020   CHOLHDL 1.9 02/15/2020   Lab Results  Component Value Date   CREATININE 0.84 03/08/2020   BUN 21 03/08/2020   NA 133 (L) 03/08/2020   K 4.1 03/08/2020   CL 101 03/08/2020   CO2 25 03/08/2020   No results found for: TSH  ASSESSMENT/PLAN    Problem List Items Addressed This Visit    Chronic diastolic heart failure (HCC) (Chronic)    Systolic function has not totally resolved now.  Still has diastolic dysfunction.  His chronic edema predated his MI.    At this point, his blood pressures are low enough that he cannot tolerate any medications besides PRN Lasix.      Relevant Medications   midodrine (PROAMATINE)  5 MG tablet   Personal history of PE (pulmonary embolism) (Chronic)    He had PE shortly after his MI.  Is probably related to his sedentary state following MI.  He is now 1 year out. He has no longer on Xarelto-stopped as of May 31.      Coronary artery disease involving native coronary artery of native heart with  angina pectoris (HCC) (Chronic)    Sizable RCA DES PCI of the setting of STEMI in May 2020.  Probably still has abdominal wall motion elevation of his MI, but no further angina.  Tolerated oxygen levels of the 60% with minimal troponin elevation indicating minimal residual disease.  He is hypotensive and therefore we cannot have a beta-blocker.  We will formally discontinue Toprol.  As per previous plan, he stopped taking Xarelto and aspirin is now on Plavix alone as monotherapy. Low-dose statin      Relevant Medications   midodrine (PROAMATINE) 5 MG tablet   Ischemic cardiomyopathy (Chronic)    Post inferior STEMI his EF was 35 to 40%.  Later that year EF was up to 55% with basal to mid inferolateral septal akinesis.  Most recent echo was 55 to 60% EF.  No regional wall motion normality noted.  (He was tachycardic and difficult to sense.)  Resolved.  Blood pressure/hypertension would not allow him to tolerate medications.      Relevant Medications   midodrine (PROAMATINE) 5 MG tablet   Other Relevant Orders   EKG 12-Lead (Completed)   Presence of drug-eluting stent in right coronary artery (Chronic)    On Plavix monotherapy.  He is more than 1 year out by chemotherapy okay in general therapy for procedures or surgeries.    Okay to hold clopidogrel/Plavix 5 days preop for surgeries or procedures.  (If acceptable to performing physician, would like to substitute 81 mg aspirin while off clopidogrel/Plavix)      Hyperlipidemia with target LDL less than 70 (Chronic)    Lipid levels are extremely low now, this is in the setting of nutrition post hospitalization.  He needs to supplement his diet significantly.  We had previously stopped his statin, and his LDL went back up again.  He is now on 10 mg rosuvastatin.  As long as he is tolerating it without any issues, will continue.      Relevant Medications   midodrine (PROAMATINE) 5 MG tablet   Tachycardia    This is probably associated  with hypotension at this point.  I do not think we are to the point radiate evaporating disease tolerating it well.  However it is not unreasonable to consider.      Hypotension due to drugs    This has been a chronic issue for him, but made worse by his hospitalization.  We have activities his lisinopril dose and finally stop it. We kept him on low-dose Toprol, but that was stopped following his hospitalization.  Plan: Start midodrine 5 mg twice daily and additional dose as needed.  Needs to increase his hydration needs level and nutrition.  We talked about nutrition supplementation using Boost or Ensure (that may be CCD he is currently Schnetzer breakfast or other protein supplementation that may be cheaper.  Needs to supplement each meal.  Also needs to drink at least 8 cups ounces of water a day. We had the I am reluctant to ask him to increase salt intake simply because he has recovered from cardiomyopathy but still has diastolic function.  Relevant Medications   midodrine (PROAMATINE) 5 MG tablet    Other Visit Diagnoses    Coronary artery disease involving native coronary artery of native heart with unstable angina pectoris (HCC)    -  Primary   Relevant Medications   midodrine (PROAMATINE) 5 MG tablet   Other Relevant Orders   EKG 12-Lead (Completed)     COVID-19 Education: The signs and symptoms of COVID-19 were discussed with the patient and how to seek care for testing (follow up with PCP or arrange E-visit).   The importance of social distancing and COVID-19 vaccination was discussed today. 1 min The patient is practicing social distancing & Masking.   I spent a total of with the patient spent in direct patient consultation.  Additional time spent with chart review  / charting (studies, outside notes, etc): 14 Total Time: 50 min   Current medicines are reviewed at length with the patient today.  (+/- concerns) N/A  This visit occurred during the  SARS-CoV-2 public health emergency.  Safety protocols were in place, including screening questions prior to the visit, additional usage of staff PPE, and extensive cleaning of exam room while observing appropriate contact time as indicated for disinfecting solutions.  Notice: This dictation was prepared with Dragon dictation along with smaller phrase technology. Any transcriptional errors that result from this process are unintentional and may not be corrected upon review.  Patient Instructions / Medication Changes & Studies & Tests Ordered   Patient Instructions  Medication Instructions:   Continue  To Hold taking Metoprolol   start  taking  Midodrine 5 mg  Twice a day - breakfast and afternoon   For the first month  Daily will give you new parameters at next visit   *If you need a refill on your cardiac medications before your next appointment, please call your pharmacy*   Lab Work:  Not needed   Testing/Procedures: Not needed   Follow-Up: At [facility], you and your health needs are our priority.  As part of our continuing mission to provide you with exceptional heart care, we have created designated Provider Care Teams.  These Care Teams include your primary Cardiologist (physician) and Advanced Practice Providers (APPs -  Physician Assistants and Nurse Practitioners) who all work together to provide you with the care you need, when you need it.     Your next appointment:   1 to 2  month(s)  Jun 01, 2020 at 9:00 am   The format for your next appointment:   Virtual Visit   Provider:   Dr Herbie Baltimore   Other Instructions   increase calorie intake -- Eat anything you like , get up moving - use walker  Or have someone with you  So you want fall. KEEP MOVING.    Studies Ordered:   Orders Placed This Encounter  Procedures  . EKG 12-Lead     Bryan Lemma, M.D., M.S. Interventional Cardiologist   Pager # [phone] Phone # [phone] [address]  [address] [place], [place] [address]   Thank you for choosing Heartcare at [facility]!!

## 2020-04-11 ENCOUNTER — Encounter: Payer: Self-pay | Admitting: Cardiology

## 2020-04-11 NOTE — Assessment & Plan Note (Signed)
This is probably associated with hypotension at this point.  I do not think we are to the point radiate evaporating disease tolerating it well.  However it is not unreasonable to consider.

## 2020-04-11 NOTE — Assessment & Plan Note (Signed)
On Plavix monotherapy.  He is more than 1 year out by chemotherapy okay in general therapy for procedures or surgeries.    Okay to hold clopidogrel/Plavix 5 days preop for surgeries or procedures.  (If acceptable to performing physician, would like to substitute 81 mg aspirin while off clopidogrel/Plavix)

## 2020-04-11 NOTE — Assessment & Plan Note (Signed)
Sizable RCA DES PCI of the setting of STEMI in May 2020.  Probably still has abdominal wall motion elevation of his MI, but no further angina.  Tolerated oxygen levels of the 60% with minimal troponin elevation indicating minimal residual disease.  He is hypotensive and therefore we cannot have a beta-blocker.  We will formally discontinue Toprol.  As per previous plan, he stopped taking Xarelto and aspirin is now on Plavix alone as monotherapy. Low-dose statin

## 2020-04-11 NOTE — Assessment & Plan Note (Signed)
Post inferior STEMI his EF was 35 to 40%.  Later that year EF was up to 55% with basal to mid inferolateral septal akinesis.  Most recent echo was 55 to 60% EF.  No regional wall motion normality noted.  (He was tachycardic and difficult to sense.)  Resolved.  Blood pressure/hypertension would not allow him to tolerate medications.

## 2020-04-11 NOTE — Assessment & Plan Note (Signed)
Systolic function has not totally resolved now.  Still has diastolic dysfunction.  His chronic edema predated his MI.    At this point, his blood pressures are low enough that he cannot tolerate any medications besides PRN Lasix.

## 2020-04-11 NOTE — Assessment & Plan Note (Addendum)
This has been a chronic issue for him, but made worse by his hospitalization.  We have activities his lisinopril dose and finally stop it. We kept him on low-dose Toprol, but that was stopped following his hospitalization.  Plan: Start midodrine 5 mg twice daily and additional dose as needed.  Needs to increase his hydration needs level and nutrition.  We talked about nutrition supplementation using Boost or Ensure (that may be CCD he is currently Schnetzer breakfast or other protein supplementation that may be cheaper.  Needs to supplement each meal.  Also needs to drink at least 8 cups ounces of water a day. We had the I am reluctant to ask him to increase salt intake simply because he has recovered from cardiomyopathy but still has diastolic function.

## 2020-04-11 NOTE — Assessment & Plan Note (Signed)
Lipid levels are extremely low now, this is in the setting of nutrition post hospitalization.  He needs to supplement his diet significantly.  We had previously stopped his statin, and his LDL went back up again.  He is now on 10 mg rosuvastatin.  As long as he is tolerating it without any issues, will continue.

## 2020-04-11 NOTE — Assessment & Plan Note (Signed)
He had PE shortly after his MI.  Is probably related to his sedentary state following MI.  He is now 1 year out. He has no longer on Xarelto-stopped as of May 31.

## 2020-05-29 ENCOUNTER — Telehealth: Payer: Self-pay | Admitting: Cardiology

## 2020-05-29 NOTE — Telephone Encounter (Signed)
I contacted the daughter.  We spoke for several months.  Bryan Lemma, MD

## 2020-05-29 NOTE — Telephone Encounter (Signed)
    Pt's daughter would like to speak with Dr. Herbie Baltimore, she wanted to express gratitude for the care given to the pt. She said per pt's doctor pt will not make it for very long

## 2020-06-01 ENCOUNTER — Telehealth: Payer: Medicare Other | Admitting: Cardiology

## 2020-06-21 IMAGING — CT CT ANGIOGRAPHY CHEST
3 of 8 series · 18 of 46 positions shown · IV contrast (APPLIED)
Comparison: None.

CLINICAL DATA: Dyspnea.

EXAM:
CT ANGIOGRAPHY CHEST WITH CONTRAST
TECHNIQUE: Multidetector CT imaging of the chest was performed using the
standard protocol during bolus administration of intravenous
contrast. Multiplanar CT image reconstructions and MIPs were
obtained to evaluate the vascular anatomy.
CONTRAST:  80mL OMNIPAQUE IOHEXOL 350 MG/ML SOLN

[Series 5: arterial · axial · arterial · 0.76mm/px · z∈[+1154,+1418]mm · 13 of 155 slices shown]
[im 12/155  lung]
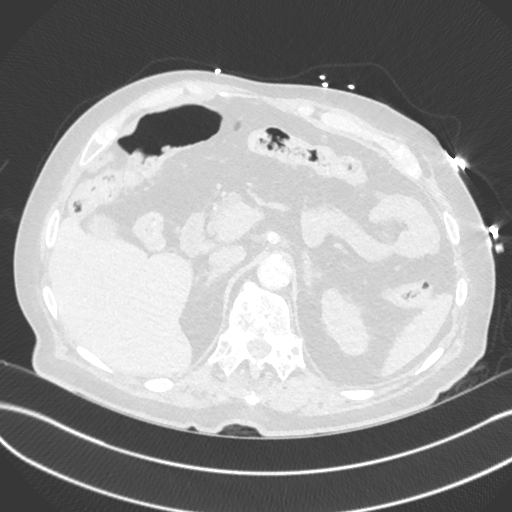
[im 23/155  soft-tissue]
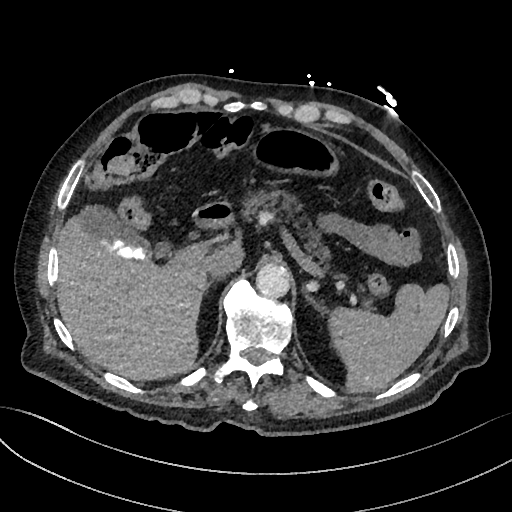
[im 34/155  lung]
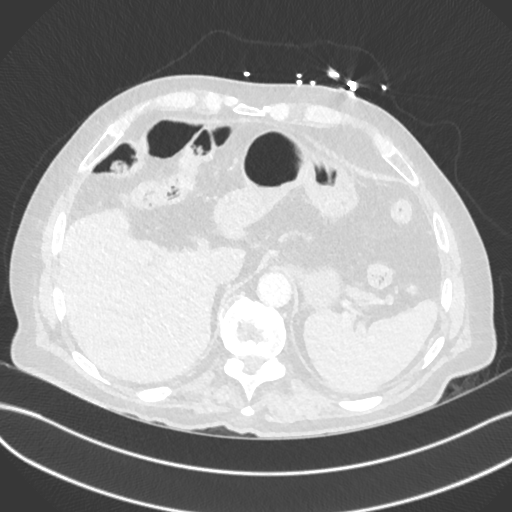
[im 45/155  soft-tissue]
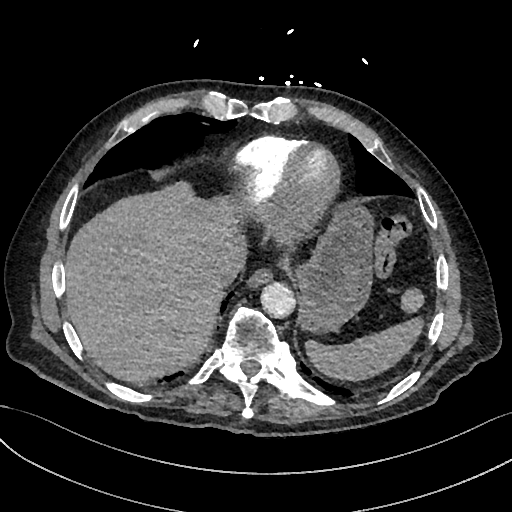
[im 56/155  lung]
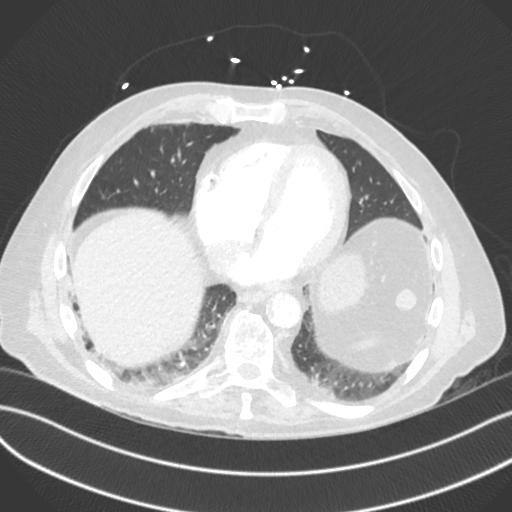
[im 67/155  soft-tissue]
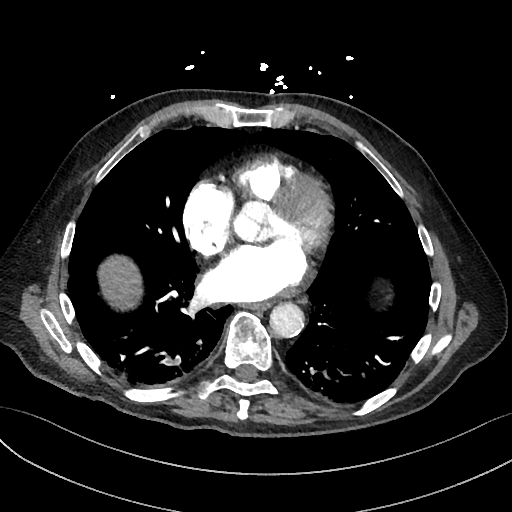
[im 78/155  lung]
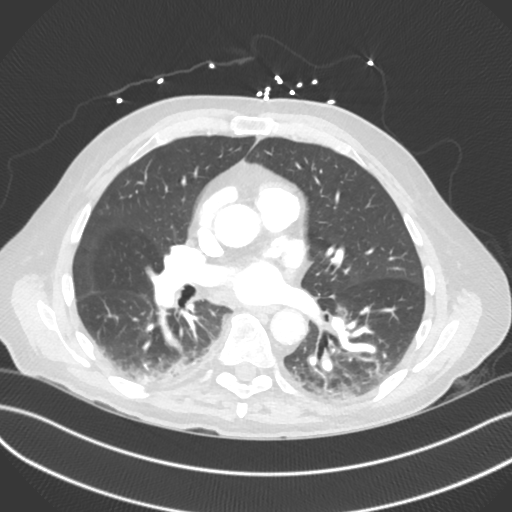
[im 89/155  soft-tissue]
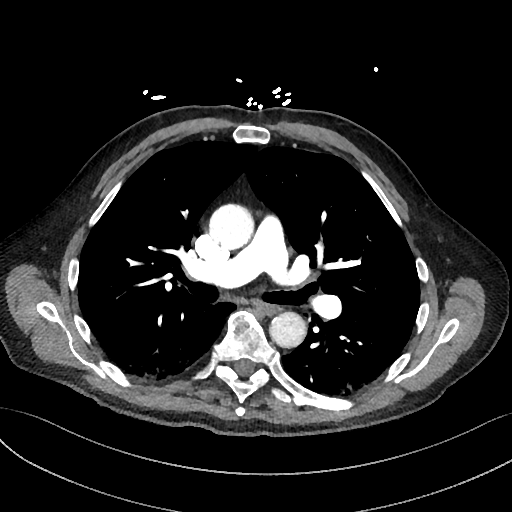
[im 100/155  lung]
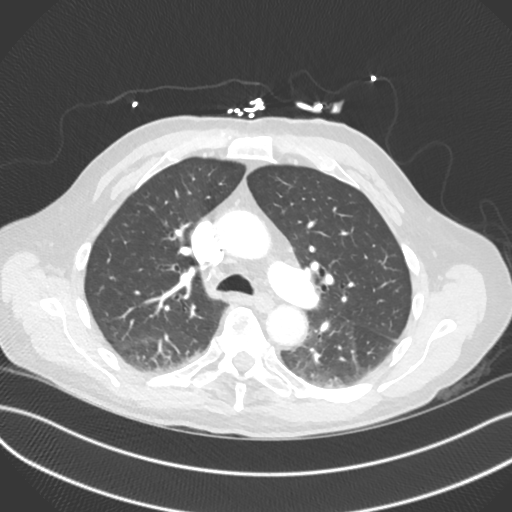
[im 111/155  soft-tissue]
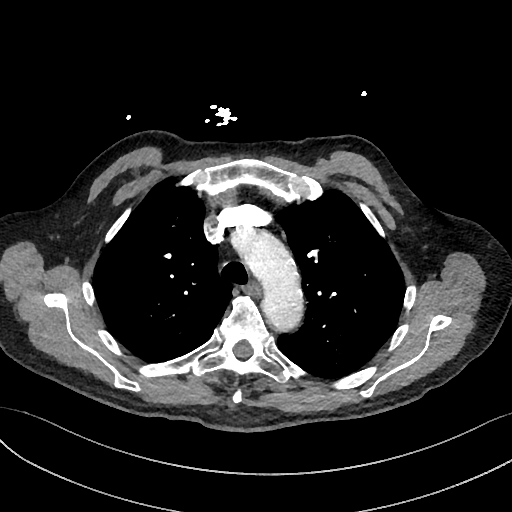
[im 122/155  lung]
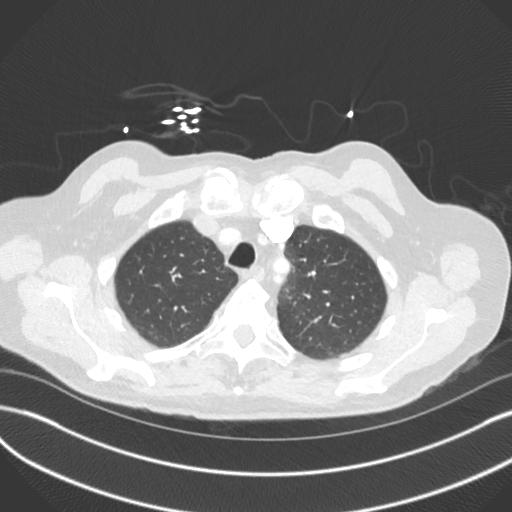
[im 133/155  soft-tissue]
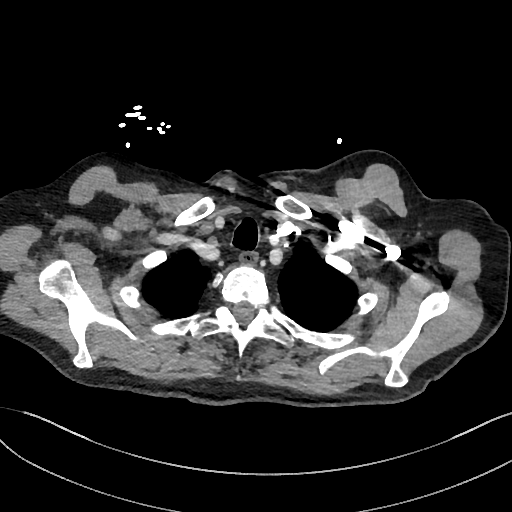
[im 144/155  lung]
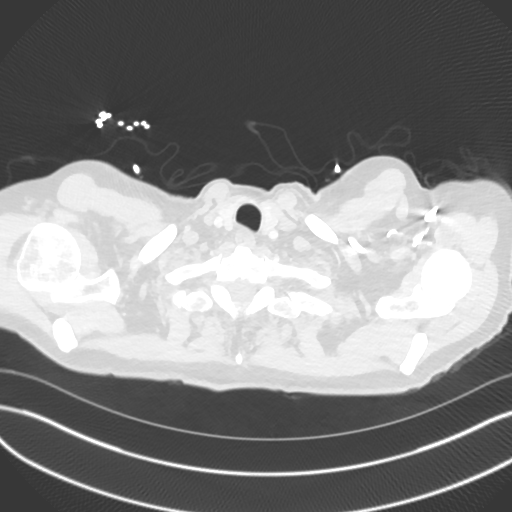

[Series 6: lung · axial · 0.76mm/px · z∈[+1154,+1198]mm · 2 of 155 slices shown]
[im 12/155  soft-tissue]
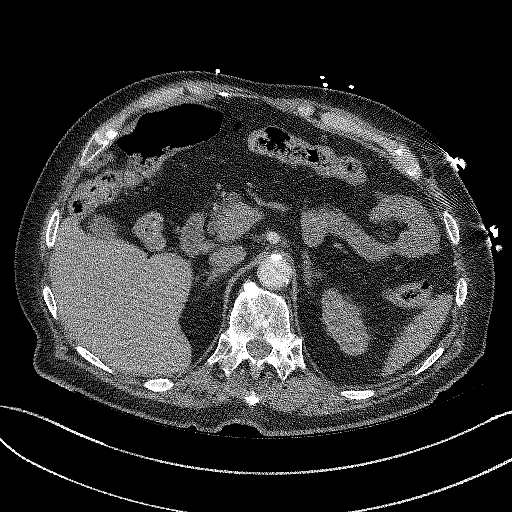
[im 34/155  soft-tissue]
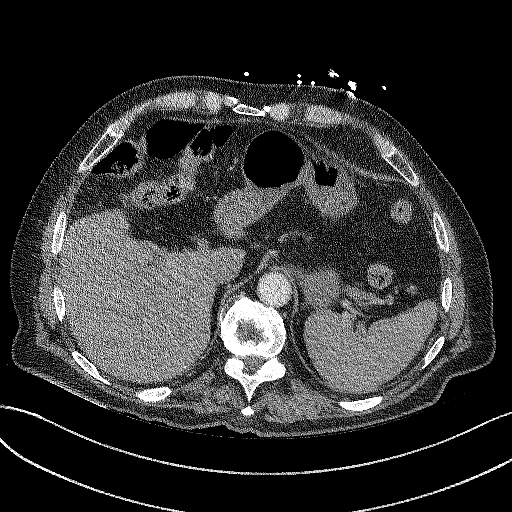

[Series 8: cor · coronal · 0.62mm/px · 3 of 151 slices shown]
[im 38/151  soft-tissue]
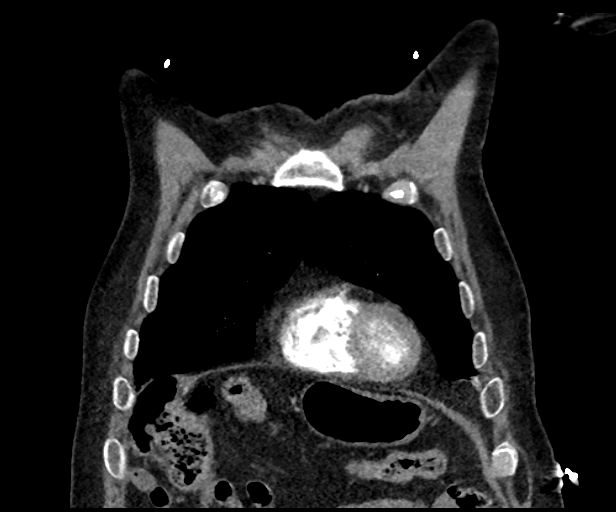
[im 76/151  soft-tissue]
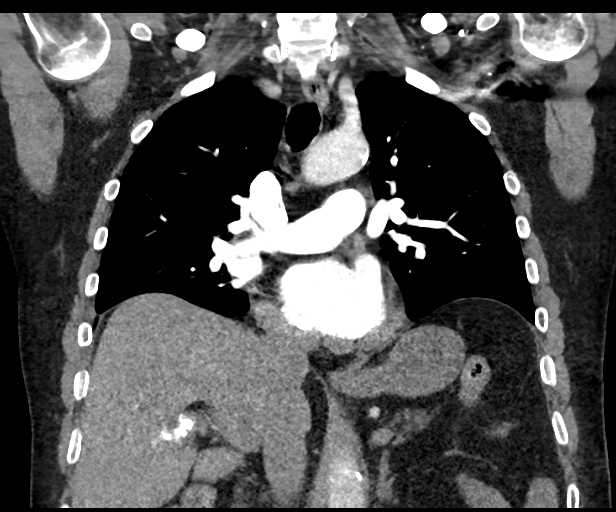
[im 113/151  soft-tissue]
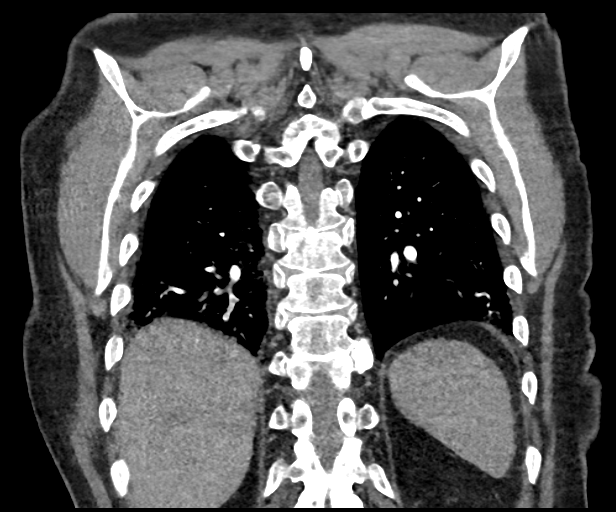

[18 of 46 positions shown; findings below may reference images not displayed]

FINDINGS: Cardiovascular: Normal heart size. No pericardial effusion. There is
aortic and coronary atherosclerosis.

Positive for multifocal segmental and subsegmental pulmonary emboli
seen in both lower lobes and the right middle lobe. No right heart
dilatation.

Mediastinum/Nodes: Negative for adenopathy or mass.

Lungs/Pleura: Lower lobe ground-glass opacity primarily attributed
atelectasis, although there may be an element of pulmonary ischemia.
No pulmonary edema or pleural effusion. 4 mm right upper lobe
pulmonary nodule, too small for follow-up purposes.

Upper Abdomen: Multiple calcified gallstones.  No acute finding

Musculoskeletal: Spondylosis. There is multilevel thoracic
ankylosis. No acute osseous finding.

Delay in reporting due to system wide outage that has just
completed.

Critical Value/emergent results were called by telephone at the time
of interpretation on 10/22/2018 at [DATE] to Dr. GLENNRECHEL MARTILU , who
verbally acknowledged these results.

Review of the MIP images confirms the above findings.
IMPRESSION: Positive for multifocal segmental and subsegmental pulmonary emboli.
No right heart dilatation.

Atherosclerosis and cholelithiasis.

## 2020-06-21 IMAGING — CR CHEST - 2 VIEW
2 series · 2 of 2 positions shown · non-contrast
Comparison: 10/18/2018

CLINICAL DATA: Chest pain for 1 day particularly on the right

EXAM:
CHEST - 2 VIEW

[chest pa]
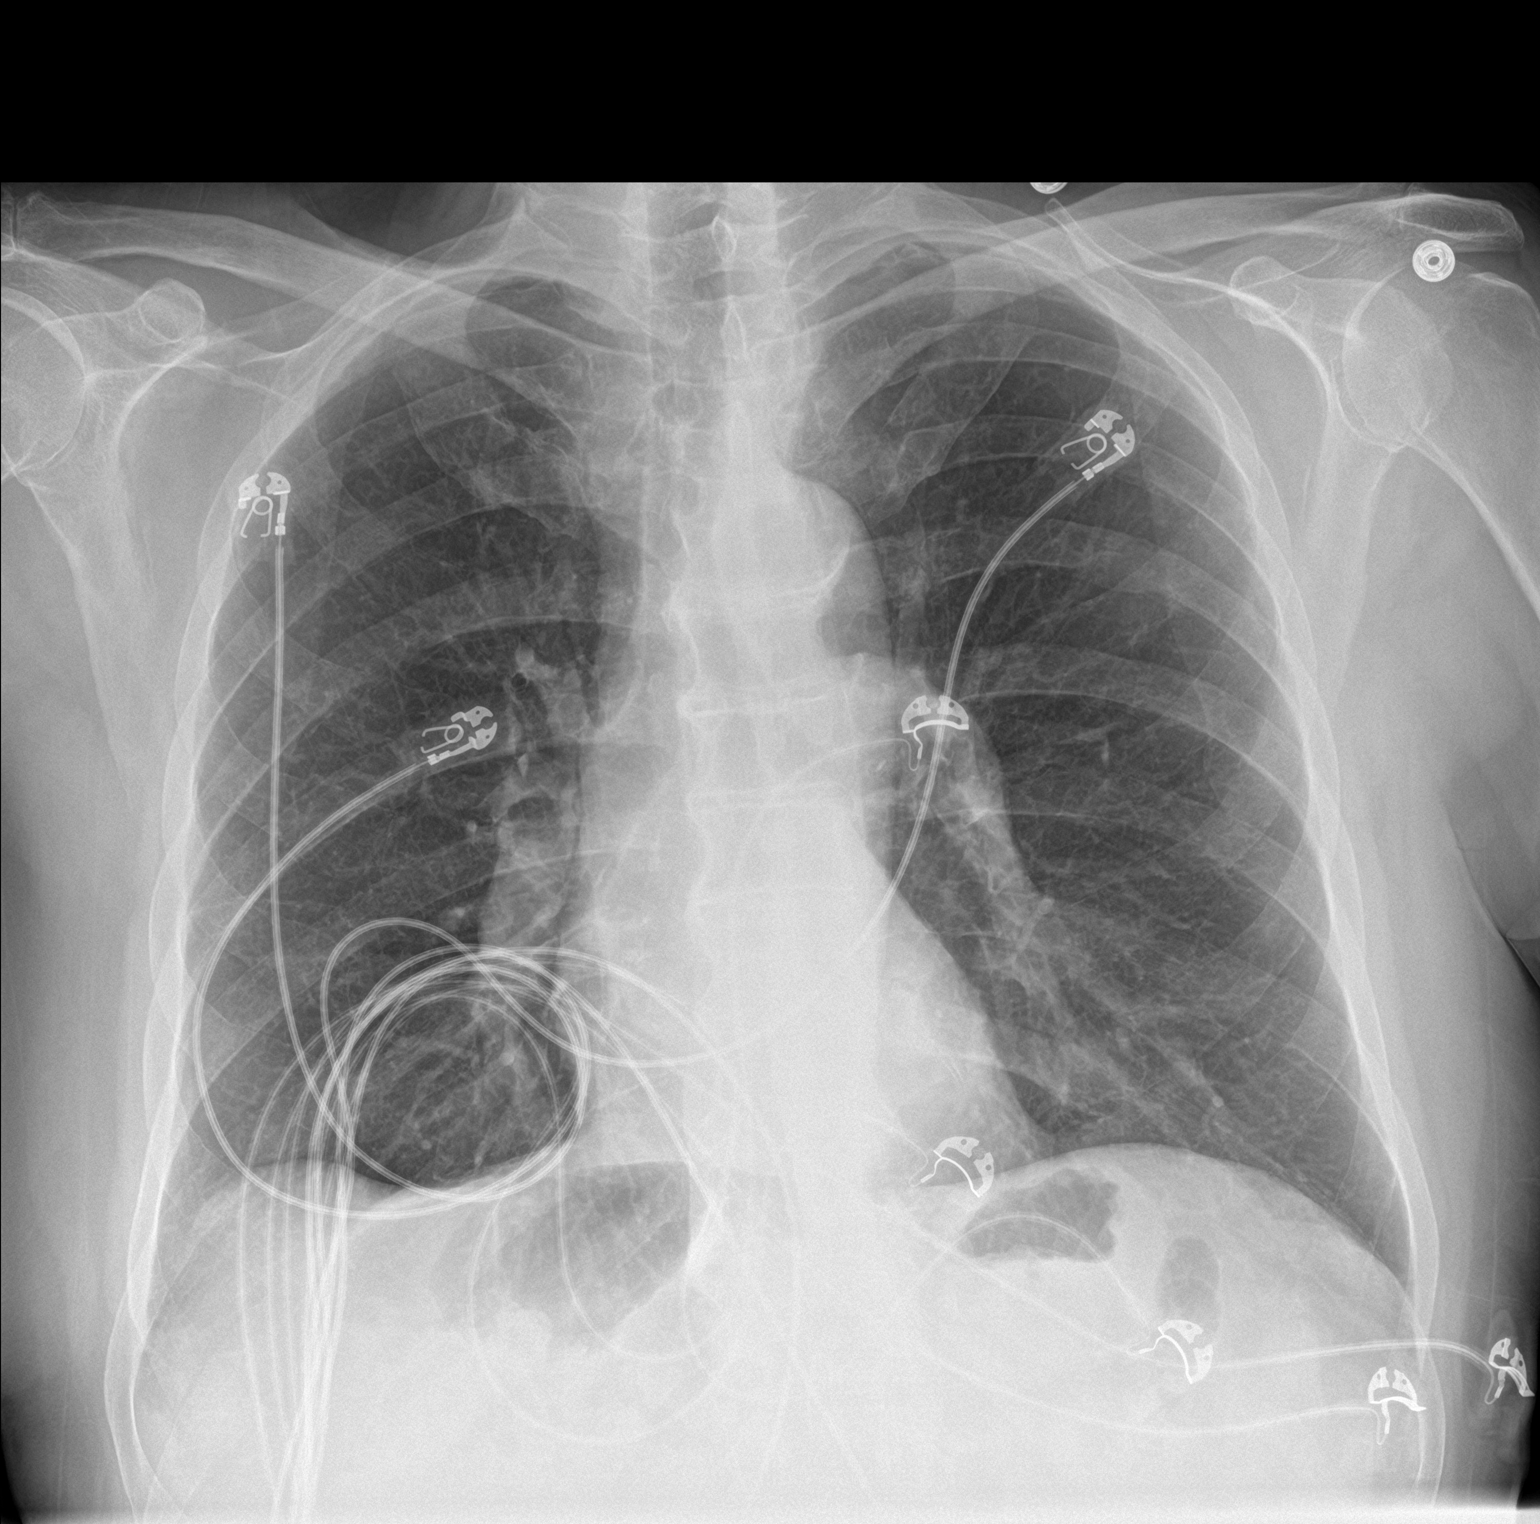

[chest lat]
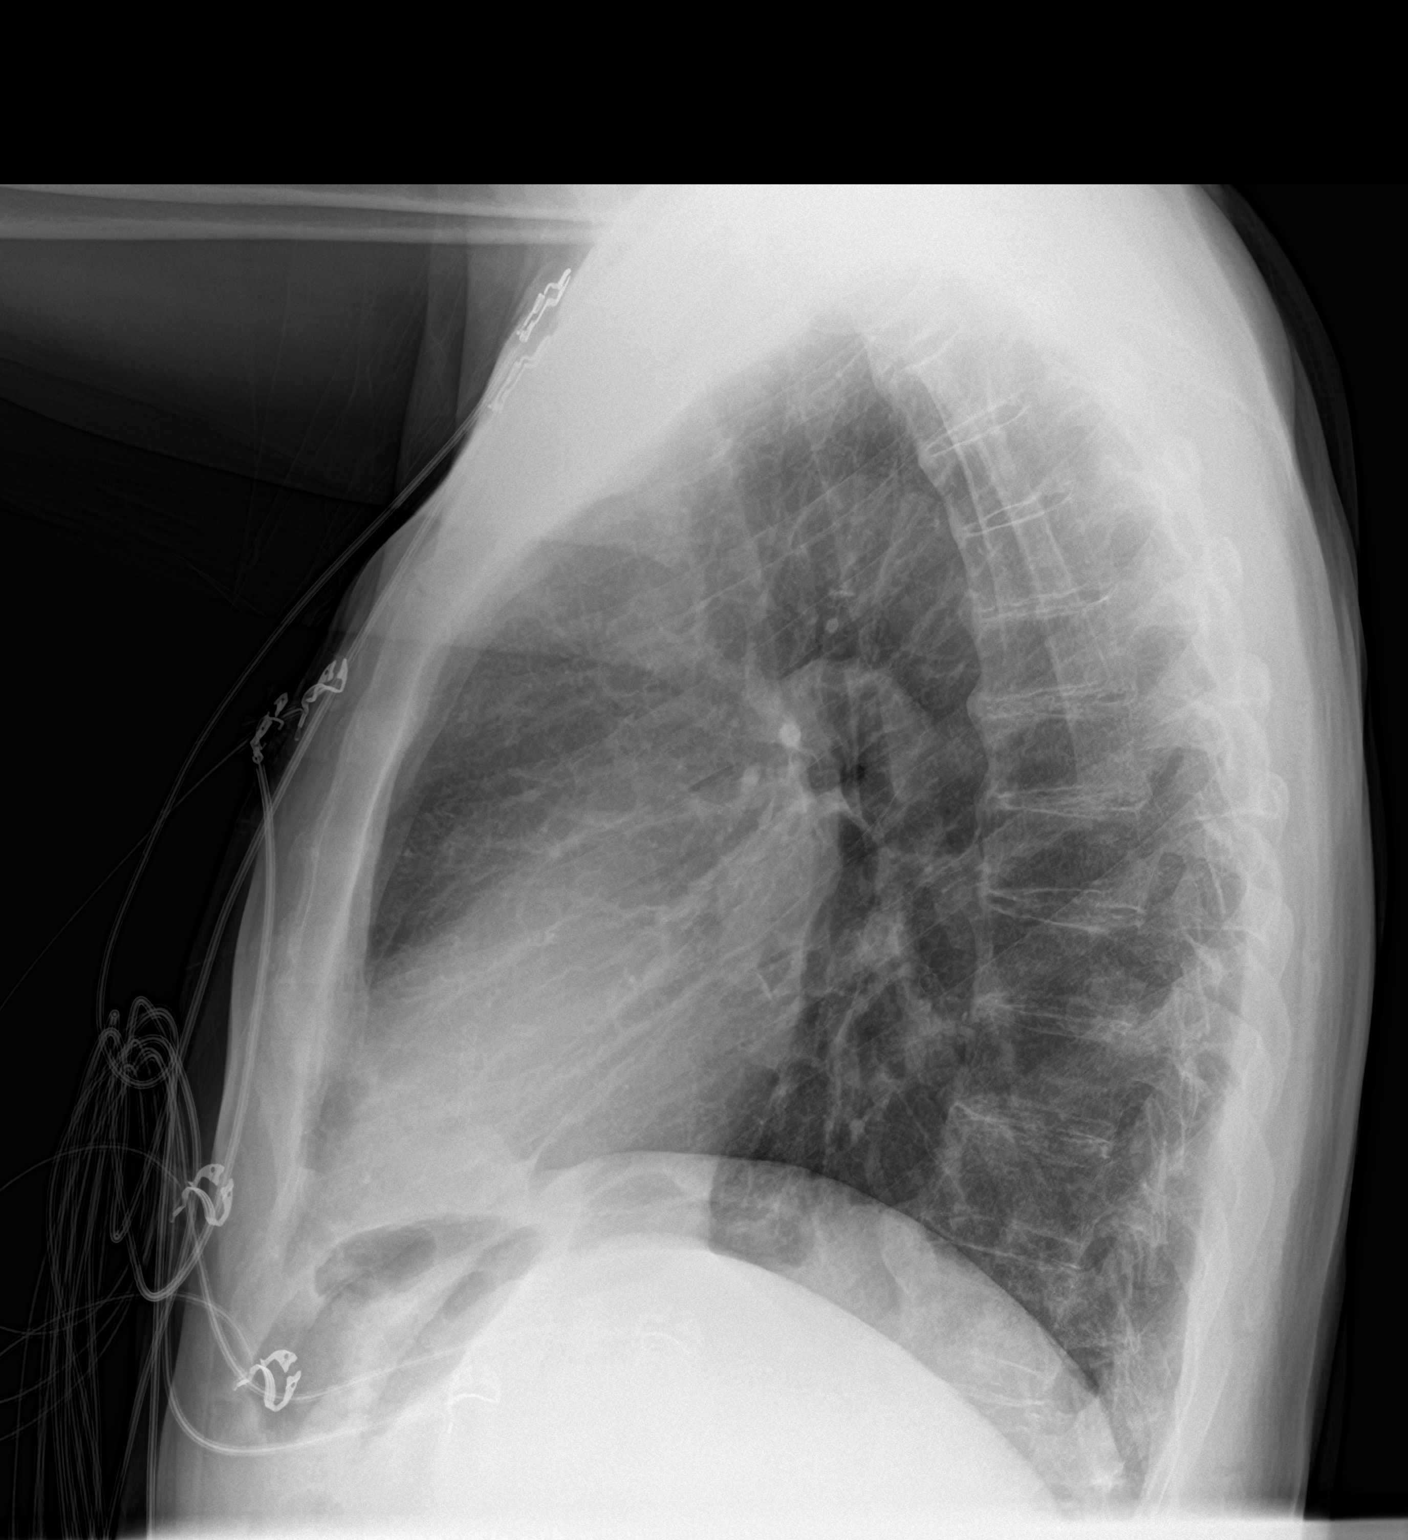

[2 of 2 positions shown; findings below may reference images not displayed]

FINDINGS: Cardiac shadows within normal limits. Aortic calcifications are
seen. The lungs are well aerated bilaterally. No focal infiltrate or
sizable effusion is seen. No bony abnormality is noted.
IMPRESSION: No acute abnormality seen.

## 2020-07-25 DEATH — deceased
